# Patient Record
Sex: Female | Born: 1940 | Race: Black or African American | Hispanic: No | Smoking: Never smoker
Health system: Southern US, Community
[De-identification: ages and names within clinical notes are randomized; demographics above are authoritative.]

## PROBLEM LIST (undated history)

## (undated) DIAGNOSIS — I499 Cardiac arrhythmia, unspecified: Secondary | ICD-10-CM

## (undated) DIAGNOSIS — I509 Heart failure, unspecified: Secondary | ICD-10-CM

## (undated) DIAGNOSIS — E785 Hyperlipidemia, unspecified: Secondary | ICD-10-CM

## (undated) DIAGNOSIS — I10 Essential (primary) hypertension: Secondary | ICD-10-CM

## (undated) DIAGNOSIS — I429 Cardiomyopathy, unspecified: Secondary | ICD-10-CM

## (undated) DIAGNOSIS — R0600 Dyspnea, unspecified: Secondary | ICD-10-CM

## (undated) DIAGNOSIS — D649 Anemia, unspecified: Secondary | ICD-10-CM

## (undated) DIAGNOSIS — G4730 Sleep apnea, unspecified: Secondary | ICD-10-CM

## (undated) DIAGNOSIS — E119 Type 2 diabetes mellitus without complications: Secondary | ICD-10-CM

## (undated) DIAGNOSIS — G629 Polyneuropathy, unspecified: Secondary | ICD-10-CM

## (undated) HISTORY — DX: Heart failure, unspecified: I50.9

## (undated) HISTORY — PX: COLONOSCOPY: SHX174

## (undated) HISTORY — DX: Hyperlipidemia, unspecified: E78.5

## (undated) HISTORY — DX: Essential (primary) hypertension: I10

## (undated) HISTORY — DX: Type 2 diabetes mellitus without complications: E11.9

## (undated) HISTORY — PX: ABDOMINAL HYSTERECTOMY: SHX81

---

## 2000-01-14 ENCOUNTER — Encounter: Payer: Self-pay | Admitting: Orthopedic Surgery

## 2000-01-14 ENCOUNTER — Observation Stay (HOSPITAL_COMMUNITY): Admission: RE | Admit: 2000-01-14 | Discharge: 2000-01-15 | Payer: Self-pay | Admitting: Orthopedic Surgery

## 2004-09-12 ENCOUNTER — Ambulatory Visit: Payer: Self-pay | Admitting: Family Medicine

## 2005-09-16 ENCOUNTER — Ambulatory Visit: Payer: Self-pay | Admitting: Family Medicine

## 2005-09-29 ENCOUNTER — Encounter: Payer: Self-pay | Admitting: Family Medicine

## 2006-11-05 ENCOUNTER — Ambulatory Visit: Payer: Self-pay | Admitting: Internal Medicine

## 2007-11-11 ENCOUNTER — Ambulatory Visit: Payer: Self-pay | Admitting: Internal Medicine

## 2008-11-14 ENCOUNTER — Ambulatory Visit: Payer: Self-pay | Admitting: Internal Medicine

## 2009-11-15 ENCOUNTER — Ambulatory Visit: Payer: Self-pay | Admitting: Internal Medicine

## 2010-12-18 ENCOUNTER — Inpatient Hospital Stay: Payer: Self-pay | Admitting: Internal Medicine

## 2011-03-05 ENCOUNTER — Ambulatory Visit: Payer: Self-pay | Admitting: Gastroenterology

## 2011-03-10 LAB — PATHOLOGY REPORT

## 2011-05-22 ENCOUNTER — Ambulatory Visit: Payer: Self-pay | Admitting: Internal Medicine

## 2011-12-10 ENCOUNTER — Ambulatory Visit: Payer: Self-pay | Admitting: Internal Medicine

## 2012-05-13 ENCOUNTER — Ambulatory Visit: Payer: Self-pay | Admitting: Unknown Physician Specialty

## 2013-12-07 ENCOUNTER — Ambulatory Visit: Payer: Self-pay | Admitting: Internal Medicine

## 2014-06-12 DIAGNOSIS — I10 Essential (primary) hypertension: Secondary | ICD-10-CM | POA: Insufficient documentation

## 2014-06-12 DIAGNOSIS — G4733 Obstructive sleep apnea (adult) (pediatric): Secondary | ICD-10-CM | POA: Insufficient documentation

## 2014-06-12 DIAGNOSIS — G629 Polyneuropathy, unspecified: Secondary | ICD-10-CM | POA: Insufficient documentation

## 2014-06-12 DIAGNOSIS — D649 Anemia, unspecified: Secondary | ICD-10-CM | POA: Insufficient documentation

## 2014-06-12 DIAGNOSIS — E1129 Type 2 diabetes mellitus with other diabetic kidney complication: Secondary | ICD-10-CM | POA: Insufficient documentation

## 2014-06-12 DIAGNOSIS — E538 Deficiency of other specified B group vitamins: Secondary | ICD-10-CM | POA: Insufficient documentation

## 2014-06-12 DIAGNOSIS — E1140 Type 2 diabetes mellitus with diabetic neuropathy, unspecified: Secondary | ICD-10-CM | POA: Insufficient documentation

## 2014-06-12 DIAGNOSIS — D500 Iron deficiency anemia secondary to blood loss (chronic): Secondary | ICD-10-CM | POA: Insufficient documentation

## 2014-06-12 DIAGNOSIS — E1159 Type 2 diabetes mellitus with other circulatory complications: Secondary | ICD-10-CM | POA: Diagnosis present

## 2014-06-12 DIAGNOSIS — E785 Hyperlipidemia, unspecified: Secondary | ICD-10-CM | POA: Insufficient documentation

## 2014-06-12 DIAGNOSIS — E559 Vitamin D deficiency, unspecified: Secondary | ICD-10-CM | POA: Insufficient documentation

## 2014-06-12 DIAGNOSIS — I429 Cardiomyopathy, unspecified: Secondary | ICD-10-CM | POA: Insufficient documentation

## 2014-06-12 DIAGNOSIS — R809 Proteinuria, unspecified: Secondary | ICD-10-CM | POA: Insufficient documentation

## 2014-06-12 DIAGNOSIS — I420 Dilated cardiomyopathy: Secondary | ICD-10-CM | POA: Insufficient documentation

## 2014-06-12 DIAGNOSIS — E11319 Type 2 diabetes mellitus with unspecified diabetic retinopathy without macular edema: Secondary | ICD-10-CM | POA: Insufficient documentation

## 2014-12-05 DIAGNOSIS — M5416 Radiculopathy, lumbar region: Secondary | ICD-10-CM | POA: Insufficient documentation

## 2014-12-19 ENCOUNTER — Ambulatory Visit: Payer: Self-pay | Admitting: Internal Medicine

## 2015-10-04 DIAGNOSIS — R2231 Localized swelling, mass and lump, right upper limb: Secondary | ICD-10-CM | POA: Insufficient documentation

## 2015-12-11 ENCOUNTER — Other Ambulatory Visit: Payer: Self-pay | Admitting: Internal Medicine

## 2015-12-11 DIAGNOSIS — Z1231 Encounter for screening mammogram for malignant neoplasm of breast: Secondary | ICD-10-CM

## 2015-12-11 DIAGNOSIS — Z79899 Other long term (current) drug therapy: Secondary | ICD-10-CM | POA: Insufficient documentation

## 2016-02-27 ENCOUNTER — Ambulatory Visit: Payer: Self-pay

## 2016-03-06 ENCOUNTER — Ambulatory Visit: Payer: Self-pay

## 2016-07-29 ENCOUNTER — Encounter: Payer: Self-pay | Admitting: Urology

## 2016-07-29 ENCOUNTER — Ambulatory Visit (INDEPENDENT_AMBULATORY_CARE_PROVIDER_SITE_OTHER): Payer: Medicare Other | Admitting: Urology

## 2016-07-29 VITALS — BP 143/72 | HR 69 | Ht 64.0 in | Wt 185.0 lb

## 2016-07-29 DIAGNOSIS — N952 Postmenopausal atrophic vaginitis: Secondary | ICD-10-CM

## 2016-07-29 DIAGNOSIS — N3941 Urge incontinence: Secondary | ICD-10-CM

## 2016-07-29 DIAGNOSIS — N393 Stress incontinence (female) (male): Secondary | ICD-10-CM | POA: Diagnosis not present

## 2016-07-29 LAB — BLADDER SCAN AMB NON-IMAGING: Scan Result: 55

## 2016-07-29 MED ORDER — ESTROGENS, CONJUGATED 0.625 MG/GM VA CREA: 1.0000 | TOPICAL_CREAM | Freq: Every day | VAGINAL | 12 refills | Status: DC

## 2016-07-29 MED ORDER — ESTRADIOL 0.1 MG/GM VA CREA: TOPICAL_CREAM | VAGINAL | 12 refills | Status: DC

## 2016-07-29 NOTE — Progress Notes (Signed)
07/29/2016 3:25 PM   Shelby Pilotatherine Brown [name] 30-Sep-1941 [id number]  Referring provider: Mickey Farberavid Thies, MD [address] [facility] [place], [place] [id number]  Chief Complaint  Patient presents with  . Follow-up    incontinence    HPI: Patient is a 75 year old African American female who presents today for urinary incontinence.    She states that she has had a long time history of incontinence.  We had seen her in 2015 and gave her a trial of Vesicare for which gave her intolerable constipation.  Myrbetriq caused her blood pressure to rise.    She did PT and she found it helpful, but she states the incontinence has returned in the last 2 months.  She is experiencing stress incontinence and urge incontinence.  She is going through 2 to 3 pads daily.  Sometimes, the pads are really soaked with urine.    She is also experiencing nocturia x 3-4.  She has sleep apnea, but she does not sleep with a CPAP machine.    She has recently started dating a man and is wanting to become sexually active.  She is worried she might leak urine during intercourse.    She is not experiencing dysuria, gross hematuria or suprapubic pain.  She has not had any recent fevers, chills, nausea or vomiting.     PMH: Past Medical History:  Diagnosis Date  . Diabetes (HCC)   . Hyperlipidemia   . Hypertension     Surgical History: Past Surgical History:  Procedure Laterality Date  . ABDOMINAL HYSTERECTOMY      Home Medications:    Medication List       Accurate as of 07/29/16  3:25 PM. Always use your most recent med list.          amLODipine 5 MG tablet Commonly known as:  NORVASC Take by mouth.   aspirin EC 325 MG tablet Take by mouth.   atorvastatin 80 MG tablet Commonly known as:  LIPITOR Take by mouth.   brimonidine 0.2 % ophthalmic solution Commonly known as:  ALPHAGAN 3 (three) times daily.   candesartan 32 MG tablet Commonly known as:  ATACAND Take by  mouth.   carvedilol 25 MG tablet Commonly known as:  COREG Take by mouth.   conjugated estrogens vaginal cream Commonly known as:  PREMARIN Place 1 Applicatorful vaginally daily. Apply 0.5mg  (pea-sized amount)  just inside the vaginal introitus with a finger-tip every night for two weeks and then Monday, Wednesday and Friday nights.   cyanocobalamin 1000 MCG/ML injection Commonly known as:  (VITAMIN B-12) Inject into the muscle.   estradiol 0.1 MG/GM vaginal cream Commonly known as:  ESTRACE VAGINAL Apply 0.5mg  (pea-sized amount)  just inside the vaginal introitus with a finger-tip every night for two weeks and then Monday, Wednesday and Friday nights.   metFORMIN 500 MG tablet Commonly known as:  GLUCOPHAGE Take by mouth.   MULTI-VITAMINS Tabs Take by mouth.   travoprost (benzalkonium) 0.004 % ophthalmic solution Commonly known as:  TRAVATAN 1 drop at bedtime.       Allergies:  Allergies  Allergen Reactions  . Mirabegron Other (See Comments)    Elevated BP    Family History: Family History  Problem Relation Age of Onset  . Diabetes Mother   . Heart disease Mother   . Bladder Cancer Neg Hx   . Kidney cancer Neg Hx     Social History:  reports that she has never smoked. She  has never used smokeless tobacco. She reports that she does not drink alcohol or use drugs.  ROS: UROLOGY Frequent Urination?: No Hard to postpone urination?: No Burning/pain with urination?: No Get up at night to urinate?: Yes Leakage of urine?: Yes Urine stream starts and stops?: No Trouble starting stream?: No Do you have to strain to urinate?: No Blood in urine?: No Urinary tract infection?: No Sexually transmitted disease?: No Injury to kidneys or bladder?: No Painful intercourse?: No Weak stream?: No Currently pregnant?: No Vaginal bleeding?: No Last menstrual period?: n  Gastrointestinal Nausea?: No Vomiting?: No Indigestion/heartburn?: No Diarrhea?:  No Constipation?: Yes  Constitutional Fever: No Night sweats?: No Weight loss?: No Fatigue?: No  Skin Skin rash/lesions?: No Itching?: No  Eyes Blurred vision?: No Double vision?: No  Ears/Nose/Throat Sore throat?: No Sinus problems?: No  Hematologic/Lymphatic Swollen glands?: No Easy bruising?: No  Cardiovascular Leg swelling?: No Chest pain?: No  Respiratory Cough?: No Shortness of breath?: No  Endocrine Excessive thirst?: No  Musculoskeletal Back pain?: No Joint pain?: No  Neurological Headaches?: No Dizziness?: No  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 Alert and oriented, No acute distress. HEENT: [place] AT, moist mucus membranes. Trachea midline, no masses. Cardiovascular: No clubbing, cyanosis, or edema. Respiratory: Normal respiratory effort, no increased work of breathing. GI: Abdomen is soft, non tender, non distended, no abdominal masses. Liver and spleen not palpable.  No hernias appreciated.  Stool sample for occult testing is not indicated.   GU: No CVA tenderness.  No bladder fullness or masses.  Atrophic external genitalia, normal pubic hair distribution, no lesions.  Normal urethral meatus, no lesions, no prolapse, no discharge.   No urethral masses, tenderness and/or tenderness. No bladder fullness, tenderness or masses. Normal vagina mucosa, good estrogen effect, no discharge, no lesions, good pelvic support, no cystocele or rectocele noted.  Cervix and uterus are surgically absent.  Could not palpate ovaries due to body habitus.  Anus and perineum are without rashes or lesions.    Skin: No rashes, bruises or suspicious lesions. Lymph: No cervical or inguinal adenopathy. Neurologic: Grossly intact, no focal deficits, moving all 4  extremities. Psychiatric: Normal mood and affect.  Laboratory Data:  Pertinent Imaging: Results for Shelby PilotSTADLER, Sarinah BROWN (MRN [id number]) as of 07/29/2016 15:02  Ref. Range 07/29/2016 14:53  Scan Result Unknown 55    Assessment & Plan:    1. Urge incontinence of urine  - offered behavioral therapies; bladder training, bladder control strategies, pelvic floor muscle training and fluid management - had PT in the past  - failed Myrbetriq  - offered refer to gynecology for a pessary fitting - not interested as she is going to become sexually active  - Vesicare caused constipation  -  would like to retry anticholinergic therapy.  Given Toviaz 8mg  samples, # 28.   Advised of the side effects, such as: Dry eyes, dry mouth, constipation, mental confusion and/or urinary retention.   - RTC in 3 weeks for PVR and symptom recheck   - Bladder Scan (Post Void Residual) in office  2. SUI  - patient has had PT in the past and does not want to attend again  3. Vaginal atrophy  I explained to the patient that when women go  through menopause and her estrogen levels are severely diminished, the normal vaginal mucosa will change.  This will cause bladder irritability and may contribute to urgency and urge incontinence.    Patient was given a sample of vaginal estrogen cream (Estrace) and instructed to apply 0.5mg  (pea-sized amount)  just inside the vaginal introitus with a finger-tip every night for two weeks and then Monday, Wednesday and Friday nights.  I explained to the patient that vaginally administered estrogen, which causes only a slight increase in the blood estrogen levels, have fewer contraindications and adverse systemic effects that oral HT.  I have also given prescriptions for the Estrace cream and Premarin cream, so that the patient may carry them to the pharmacy to see which one of the branded creams would be most economical for her.  She will return in 3 weeks for symptom recheck, exam and  report if she was available to require the vaginal cream by prescription.   Return in about 3 weeks (around 08/19/2016) for PVR and exam.  These notes generated with voice recognition software. I apologize for typographical errors.  Michiel Cowboy, PA-C  [facility] [address], [address] [place], [place] [phone]

## 2016-07-29 NOTE — Patient Instructions (Signed)
   I have given you two prescriptions for a vaginal estrogen cream.  Estrace and Premarin.  Please take these to your pharmacy and see which one your insurance covers.  If both are too expensive, please call the office at [phone] for an alternative.  You are given a sample of vaginal estrogen cream (Premarin/Estrace) and instructed to apply 0.5mg (pea-sized amount)  just inside the vaginal introitus with a finger-tip every night for two weeks.    

## 2016-07-30 ENCOUNTER — Telehealth: Payer: Self-pay

## 2016-07-30 NOTE — Telephone Encounter (Signed)
Pt called stating she told to call back with price of estrace cream. Pt stated estrace is [phone].

## 2016-07-30 NOTE — Telephone Encounter (Signed)
Spoke with pt in reference to estrace cream and providing samples. Pt voiced understanding.

## 2016-07-30 NOTE — Telephone Encounter (Signed)
Please tell her that she does not need to fill that prescription.  We can give samples.

## 2016-08-19 ENCOUNTER — Ambulatory Visit: Payer: Medicare Other | Admitting: Urology

## 2016-08-20 ENCOUNTER — Ambulatory Visit: Payer: Medicare Other | Admitting: Urology

## 2016-09-03 ENCOUNTER — Encounter: Payer: Self-pay | Admitting: Urology

## 2016-09-03 ENCOUNTER — Ambulatory Visit (INDEPENDENT_AMBULATORY_CARE_PROVIDER_SITE_OTHER): Payer: Medicare Other | Admitting: Urology

## 2016-09-03 ENCOUNTER — Telehealth: Payer: Self-pay | Admitting: Urology

## 2016-09-03 VITALS — BP 109/65 | HR 74 | Ht 64.0 in | Wt 184.6 lb

## 2016-09-03 DIAGNOSIS — N952 Postmenopausal atrophic vaginitis: Secondary | ICD-10-CM | POA: Diagnosis not present

## 2016-09-03 DIAGNOSIS — N3941 Urge incontinence: Secondary | ICD-10-CM

## 2016-09-03 DIAGNOSIS — N393 Stress incontinence (female) (male): Secondary | ICD-10-CM

## 2016-09-03 LAB — BLADDER SCAN AMB NON-IMAGING: SCAN RESULT: 28

## 2016-09-03 MED ORDER — FESOTERODINE FUMARATE ER 8 MG PO TB24: 8.0000 mg | ORAL_TABLET | Freq: Every day | ORAL | 4 refills | Status: DC

## 2016-09-03 NOTE — Progress Notes (Signed)
09/03/2016 10:18 AM   Shelby [name] Oct 23, 1941 [id number]  Referring provider: No referring provider defined for this encounter.  Chief Complaint  Patient presents with  . Urinary Urgency    Follow up    HPI: Patient is a 75 year old African American female who presents today for a three week follow up after a trial of Toviaz 8 mg for urinary incontinence and vaginal estrogen cream for vaginal atrophy.  Background She states that she has had a long time history of incontinence.  We had seen her in 2015 and gave her a trial of Vesicare for which gave her intolerable constipation.  Myrbetriq caused her blood pressure to rise.  She did PT and she found it helpful, but she states the incontinence had returned.  She was experiencing stress incontinence and urge incontinence.  She was going through 2 to 3 pads daily.  Sometimes, the pads are really soaked with urine.  She was also experiencing nocturia x 3-4.  She has sleep apnea, but she does not sleep with a CPAP machine.    She has recently started dating a man and is wanting to become sexually active.  She is worried she might leak urine during intercourse.    She is not experiencing dysuria, gross hematuria or suprapubic pain.  She has not had any recent fevers, chills, nausea or vomiting.    She states that she has noted an improvement since being on the Toviaz 8 mg daily. She has not had any severe side effects.  She states her urinary symptoms have improved by 50%.  She has been pleased with the effect of Toviaz. She would like to continue the medication.    Her PVR today was 28 mL.    She is using a vaginal estrogen cream 3 nights weekly.  She has not noted any vaginal irritation or rash with the cream.   PMH: Past Medical History:  Diagnosis Date  . Diabetes (HCC)   . Hyperlipidemia   . Hypertension     Surgical History: Past Surgical History:  Procedure Laterality Date  . ABDOMINAL HYSTERECTOMY       Home Medications:    Medication List       Accurate as of 09/03/16 10:18 AM. Always use your most recent med list.          amLODipine 5 MG tablet Commonly known as:  NORVASC Take by mouth.   aspirin EC 325 MG tablet Take by mouth.   atorvastatin 80 MG tablet Commonly known as:  LIPITOR Take by mouth.   brimonidine 0.2 % ophthalmic solution Commonly known as:  ALPHAGAN 3 (three) times daily.   candesartan 32 MG tablet Commonly known as:  ATACAND Take by mouth.   carvedilol 25 MG tablet Commonly known as:  COREG Take by mouth.   conjugated estrogens vaginal cream Commonly known as:  PREMARIN Place 1 Applicatorful vaginally daily. Apply 0.5mg  (pea-sized amount)  just inside the vaginal introitus with a finger-tip every night for two weeks and then Monday, Wednesday and Friday nights.   cyanocobalamin 1000 MCG/ML injection Commonly known as:  (VITAMIN B-12) Inject into the muscle.   estradiol 0.1 MG/GM vaginal cream Commonly known as:  ESTRACE VAGINAL Apply 0.5mg  (pea-sized amount)  just inside the vaginal introitus with a finger-tip every night for two weeks and then Monday, Wednesday and Friday nights.   fesoterodine 8 MG Tb24 tablet Commonly known as:  TOVIAZ Take 1 tablet (8 mg total) by mouth  daily.   metFORMIN 500 MG tablet Commonly known as:  GLUCOPHAGE Take by mouth.   MULTI-VITAMINS Tabs Take by mouth.   travoprost (benzalkonium) 0.004 % ophthalmic solution Commonly known as:  TRAVATAN 1 drop at bedtime.       Allergies:  Allergies  Allergen Reactions  . Mirabegron Other (See Comments)    Elevated BP    Family History: Family History  Problem Relation Age of Onset  . Diabetes Mother   . Heart disease Mother   . Bladder Cancer Neg Hx   . Kidney cancer Neg Hx     Social History:  reports that she has never smoked. She has never used smokeless tobacco. She reports that she does not drink alcohol or use  drugs.  ROS: UROLOGY Frequent Urination?: Yes Hard to postpone urination?: No Burning/pain with urination?: No Get up at night to urinate?: Yes Leakage of urine?: Yes Urine stream starts and stops?: No Trouble starting stream?: No Do you have to strain to urinate?: No Blood in urine?: No Urinary tract infection?: No Sexually transmitted disease?: No Injury to kidneys or bladder?: No Painful intercourse?: No Weak stream?: No Currently pregnant?: No Vaginal bleeding?: No Last menstrual period?: n  Gastrointestinal Nausea?: No Vomiting?: No Indigestion/heartburn?: No Diarrhea?: No Constipation?: Yes  Constitutional Fever: No Night sweats?: Yes Weight loss?: No Fatigue?: No  Skin Skin rash/lesions?: No Itching?: No  Eyes Blurred vision?: No Double vision?: No  Ears/Nose/Throat Sore throat?: No Sinus problems?: No  Hematologic/Lymphatic Swollen glands?: No Easy bruising?: No  Cardiovascular Leg swelling?: No Chest pain?: No  Respiratory Cough?: No Shortness of breath?: No  Endocrine Excessive thirst?: No  Musculoskeletal Back pain?: Yes Joint pain?: No  Neurological Headaches?: No Dizziness?: No  Psychologic Depression?: No Anxiety?: No  Physical Exam: BP 109/65 (BP Location: Left Arm, Patient Position: Sitting, Cuff Size: Normal)   Pulse 74   Ht 5\' 4"  (1.626 m)   Wt 184 lb 9.6 oz (83.7 kg)   BMI 31.69 kg/m   Constitutional: Well nourished. Alert and oriented, No acute distress. HEENT: [place] AT, moist mucus membranes. Trachea midline, no masses. Cardiovascular: No clubbing, cyanosis, or edema. Respiratory: Normal respiratory effort, no increased work of breathing. Skin: No rashes, bruises or suspicious lesions. Lymph: No cervical or inguinal adenopathy. Neurologic: Grossly intact, no focal deficits, moving all 4 extremities. Psychiatric: Normal mood and affect.  Laboratory Data:  Pertinent Imaging: Results for Shelby, [name] (MRN [id number]) as of 09/03/2016 10:13  Ref. Range 09/03/2016 10:09  Scan Result Unknown 28     Assessment & Plan:    1. Urge incontinence of urine  - had PT in the past  - failed Myrbetriq  - offered refer to gynecology for a pessary fitting - not interested as she is going to become sexually active  - Vesicare caused constipation  - Found the Toviaz effective   - Continue Toviaz 8mg  - prescription sent to pharmacy   - RTC in 3 months for PVR and symptom recheck   - Bladder Scan (Post Void Residual) in office  2. SUI  - patient has had PT in the past and does not want to attend again  - Toviaz provided some benefit  3. Vaginal atrophy  - Patient to continue the vaginal estrogen cream 3 nights weekly   - Patient was given a sample of vaginal estrogen cream (Estrace)   - I have also given prescriptions for the Estrace cream and Premarin cream, so that the patient  may carry them to the pharmacy to see which one of the branded creams would be most economical for her.    - She will return in 3 months for symptom recheck, exam and report if she was available to require the vaginal cream by prescription.   Return in about 3 months (around 12/03/2016) for PVR and exam.  These notes generated with voice recognition software. I apologize for typographical errors.  Michiel Cowboy, PA-C  [facility] [address] [place], [place] [address] [phone]

## 2016-09-03 NOTE — Telephone Encounter (Signed)
Would you call in the compounded vaginal estrogen cream for this patient?

## 2016-09-03 NOTE — Telephone Encounter (Signed)
Called medication into [facility].

## 2016-12-10 ENCOUNTER — Ambulatory Visit: Payer: Medicare Other | Admitting: Urology

## 2016-12-10 ENCOUNTER — Encounter: Payer: Self-pay | Admitting: Urology

## 2016-12-23 ENCOUNTER — Ambulatory Visit: Admission: RE | Admit: 2016-12-23 | Payer: Medicare Other | Source: Ambulatory Visit | Admitting: Gastroenterology

## 2016-12-23 ENCOUNTER — Encounter: Admission: RE | Payer: Self-pay | Source: Ambulatory Visit

## 2016-12-23 SURGERY — COLONOSCOPY WITH PROPOFOL
Anesthesia: General

## 2016-12-26 ENCOUNTER — Ambulatory Visit: Admitting: Urology

## 2017-01-01 NOTE — Progress Notes (Signed)
01/02/2017 11:07 AM   Shelby [name] [birth year] [id number]  Referring provider: No referring provider defined for this encounter.  Chief Complaint  Patient presents with  . Urinary Incontinence    67month    HPI: Patient is a 76 year old African American female who presents today for a three month follow up after a trial of Toviaz 8 mg for urinary incontinence and vaginal estrogen cream for vaginal atrophy.  Background She states that she has had a long time history of incontinence.  We had seen her in 2015 and gave her a trial of Vesicare for which gave her intolerable constipation.  Myrbetriq caused her blood pressure to rise.  She did PT and she found it helpful, but she states the incontinence had returned.  She was experiencing stress incontinence and urge incontinence.  She was going through 2 to 3 pads daily.  Sometimes, the pads are really soaked with urine.  She was also experiencing nocturia x 3-4.  She has sleep apnea, but she does not sleep with a CPAP machine.  She has recently started dating a man and is wanting to become sexually active.  She is worried she might leak urine during intercourse.  She is not experiencing dysuria, gross hematuria or suprapubic pain.  She has not had any recent fevers, chills, nausea or vomiting.  She states that she has noted an improvement since being on the Toviaz 8 mg daily. She has not had any severe side effects.  She states her urinary symptoms have improved by 50%.  She has been pleased with the effect of Toviaz. She would like to continue the medication.    Her PVR today was 0 mL.  She is using a vaginal estrogen cream 3 nights weekly.  She has not noted any vaginal irritation or rash with the cream.  She states that she is still having benefits with the Toviaz 8 mg daily and is using a vaginal cream 3 days weekly. She has urgency to 3 times daily, she uses the rest room 3 times daily, she does try to restrict fluids in an effort  to stave off bathroom trips, she does not engage toilet mapping, she is having SUI up to seven times daily and nocturia x 3.    She is not having dysuria, gross hematuria or suprapubic pain.  She has not had fevers, chills, nausea or vomiting.     PMH: Past Medical History:  Diagnosis Date  . Diabetes (HCC)   . Hyperlipidemia   . Hypertension     Surgical History: Past Surgical History:  Procedure Laterality Date  . ABDOMINAL HYSTERECTOMY      Home Medications:  Allergies as of 01/02/2017      Reactions   Mirabegron Other (See Comments)   Elevated BP      Medication List       Accurate as of 01/02/17 11:07 AM. Always use your most recent med list.          amLODipine 5 MG tablet Commonly known as:  NORVASC Take by mouth.   aspirin EC 325 MG tablet Take by mouth.   atorvastatin 80 MG tablet Commonly known as:  LIPITOR Take by mouth.   brimonidine 0.2 % ophthalmic solution Commonly known as:  ALPHAGAN 3 (three) times daily.   candesartan 32 MG tablet Commonly known as:  ATACAND Take by mouth.   carvedilol 25 MG tablet Commonly known as:  COREG Take by mouth.   cyanocobalamin 1000  MCG/ML injection Commonly known as:  (VITAMIN B-12) Inject into the muscle.   estradiol 0.1 MG/GM vaginal cream Commonly known as:  ESTRACE VAGINAL Apply 0.5mg  (pea-sized amount)  just inside the vaginal introitus with a finger-tip every night for two weeks and then Monday, Wednesday and Friday nights.   fesoterodine 8 MG Tb24 tablet Commonly known as:  TOVIAZ Take 1 tablet (8 mg total) by mouth daily.   metFORMIN 500 MG tablet Commonly known as:  GLUCOPHAGE Take by mouth.   MULTI-VITAMINS Tabs Take by mouth.   travoprost (benzalkonium) 0.004 % ophthalmic solution Commonly known as:  TRAVATAN 1 drop at bedtime.       Allergies:  Allergies  Allergen Reactions  . Mirabegron Other (See Comments)    Elevated BP    Family History: Family History  Problem  Relation Age of Onset  . Diabetes Mother   . Heart disease Mother   . Bladder Cancer Neg Hx   . Kidney cancer Neg Hx     Social History:  reports that she has never smoked. She has never used smokeless tobacco. She reports that she does not drink alcohol or use drugs.  ROS: UROLOGY Frequent Urination?: Yes Hard to postpone urination?: No Burning/pain with urination?: No Get up at night to urinate?: No Leakage of urine?: Yes Urine stream starts and stops?: No Trouble starting stream?: No Do you have to strain to urinate?: No Blood in urine?: No Urinary tract infection?: No Sexually transmitted disease?: No Injury to kidneys or bladder?: No Painful intercourse?: No Weak stream?: No Currently pregnant?: No Vaginal bleeding?: No Last menstrual period?: n  Gastrointestinal Nausea?: No Vomiting?: No Indigestion/heartburn?: No Diarrhea?: No Constipation?: No  Constitutional Fever: No Night sweats?: No Weight loss?: No Fatigue?: No  Skin Skin rash/lesions?: No Itching?: No  Eyes Blurred vision?: No Double vision?: No  Ears/Nose/Throat Sore throat?: No Sinus problems?: No  Hematologic/Lymphatic Swollen glands?: No Easy bruising?: No  Cardiovascular Leg swelling?: No Chest pain?: No  Respiratory Cough?: No Shortness of breath?: No  Endocrine Excessive thirst?: No  Musculoskeletal Back pain?: No Joint pain?: No  Neurological Headaches?: No Dizziness?: No  Psychologic Depression?: No Anxiety?: No  Physical Exam: BP 114/65   Pulse 74   Ht 5\' 4"  (1.626 m)   Wt 184 lb (83.5 kg)   BMI 31.58 kg/m   Constitutional: Well nourished. Alert and oriented, No acute distress. HEENT:  AT, moist mucus membranes. Trachea midline, no masses. Cardiovascular: No clubbing, cyanosis, or edema. Respiratory: Normal respiratory effort, no increased work of breathing. GI: Abdomen is soft, non tender, non distended, no abdominal masses. Liver and spleen not  palpable.  No hernias appreciated.  Stool sample for occult testing is not indicated.   GU: No CVA tenderness.  No bladder fullness or masses.  Atrophic external genitalia, normal pubic hair distribution, no lesions.  Normal urethral meatus, no lesions, no prolapse, no discharge.   No urethral masses, tenderness and/or tenderness. No bladder fullness, tenderness or masses. Normal vagina mucosa, good estrogen effect, no discharge, no lesions, good pelvic support, no cystocele or rectocele noted.  Cervix and uterus are surgically absent.  Could not palpate ovaries due to body habitus.  Anus and perineum are without rashes or lesions.    Skin: No rashes, bruises or suspicious lesions. Lymph: No cervical or inguinal adenopathy. Neurologic: Grossly intact, no focal deficits, moving all 4 extremities. Psychiatric: Normal mood and affect.   Pertinent Imaging: Results for Shelby, [name] (MRN [id number]) as  of 01/02/2017 11:01  Ref. Range 01/02/2017 11:00  Scan Result Unknown 0ml    Assessment & Plan:    1. Urge incontinence of urine  - had PT in the past  - failed Myrbetriq  - offered refer to gynecology for a pessary fitting - not interested as she is going to become sexually active  - Vesicare caused constipation  - Found the Toviaz effective   - Continue Toviaz 8mg  - prescription sent to pharmacy   - RTC in following PT for PVR and OAB questionnaire  - Bladder Scan (Post Void Residual) in office  2. SUI  - patient has had PT in the past and does not want to attend again; I have encouraged the patient to undergo physical therapy again as I feel this to be most beneficial in helping her with her stress urinary incontinence, she is agreeable  - Toviaz provided some benefit  3. Vaginal atrophy  - Patient to continue the vaginal estrogen cream 3 nights weekly     Return for follow up after PT.  These notes generated with voice recognition software. I apologize for typographical  errors.  Michiel Cowboy, PA-C  [facility] [address] [place], [place] [address] [phone]

## 2017-01-02 ENCOUNTER — Encounter: Payer: Self-pay | Admitting: Urology

## 2017-01-02 ENCOUNTER — Ambulatory Visit (INDEPENDENT_AMBULATORY_CARE_PROVIDER_SITE_OTHER): Payer: Medicare Other | Admitting: Urology

## 2017-01-02 VITALS — BP 114/65 | HR 74 | Ht 64.0 in | Wt 184.0 lb

## 2017-01-02 DIAGNOSIS — N3941 Urge incontinence: Secondary | ICD-10-CM | POA: Diagnosis not present

## 2017-01-02 DIAGNOSIS — N952 Postmenopausal atrophic vaginitis: Secondary | ICD-10-CM | POA: Diagnosis not present

## 2017-01-02 DIAGNOSIS — N393 Stress incontinence (female) (male): Secondary | ICD-10-CM

## 2017-01-02 LAB — BLADDER SCAN AMB NON-IMAGING

## 2017-02-23 DIAGNOSIS — R32 Unspecified urinary incontinence: Secondary | ICD-10-CM | POA: Insufficient documentation

## 2017-03-31 ENCOUNTER — Other Ambulatory Visit: Payer: Self-pay | Admitting: Internal Medicine

## 2017-03-31 DIAGNOSIS — Z1231 Encounter for screening mammogram for malignant neoplasm of breast: Secondary | ICD-10-CM

## 2017-04-07 ENCOUNTER — Ambulatory Visit: Admission: RE | Admit: 2017-04-07 | Payer: Medicare Other | Source: Ambulatory Visit | Admitting: Gastroenterology

## 2017-04-07 ENCOUNTER — Encounter: Admission: RE | Payer: Self-pay | Source: Ambulatory Visit

## 2017-04-07 SURGERY — COLONOSCOPY WITH PROPOFOL
Anesthesia: General

## 2017-06-03 ENCOUNTER — Ambulatory Visit: Payer: Medicare Other | Admitting: Urology

## 2017-06-04 ENCOUNTER — Ambulatory Visit: Payer: Medicare Other | Admitting: Urology

## 2017-06-07 NOTE — Progress Notes (Signed)
06/08/2017 3:06 PM   Shelby [name] 24-Nov-1941 [id number]  Referring provider: Mickey Farber, MD [address] [facility] [place], [place] [address]  Chief Complaint  Patient presents with  . Urinary Incontinence    follow up    HPI: Patient is a 76 year old [place] American female who presents today with the complaint of an inability to hold her urine at night.     Background She states that she has had a long time history of incontinence.  We had seen her in 2015 and gave her a trial of Vesicare for which gave her intolerable constipation.  Myrbetriq caused her blood pressure to rise.  She did PT and she found it helpful, but she states the incontinence had returned.  She was experiencing stress incontinence and urge incontinence.  She was going through 2 to 3 pads daily.  Sometimes, the pads are really soaked with urine.  She was also experiencing nocturia x 3-4.  She has sleep apnea, but she does not sleep with a CPAP machine.  She has recently started dating a man and is wanting to become sexually active.  She is worried she might leak urine during intercourse.  She is not experiencing dysuria, gross hematuria or suprapubic pain.  She has not had any recent fevers, chills, nausea or vomiting.  She states that she has noted an improvement since being on the Toviaz 8 mg daily. She has not had any severe side effects.  She states her urinary symptoms have improved by 50%.  She has been pleased with the effect of Toviaz. She would like to continue the medication.    Her PVR today was 15 mL.  She is using a vaginal estrogen cream 3 nights weekly.  She has not noted any vaginal irritation or rash with the cream.  She states that she is still having benefits with the Toviaz 8 mg daily.  The patient has been experiencing urgency x 4-7 (worse), frequency x 4-7 (worse), is restricting fluids to avoid visits to the restroom, is engaging in toilet mapping, incontinence x 4-7  (worse) and nocturia x 0-3 (stable).    She states she has noticed worsening of her symptoms, since she stopped her vaginal estrogen cream.    She is not having dysuria, gross hematuria or suprapubic pain.  She has not had fevers, chills, nausea or vomiting.     PMH: Past Medical History:  Diagnosis Date  . Diabetes (HCC)   . Hyperlipidemia   . Hypertension     Surgical History: Past Surgical History:  Procedure Laterality Date  . ABDOMINAL HYSTERECTOMY      Home Medications:  Allergies as of 06/08/2017      Reactions   Mirabegron Other (See Comments)   Elevated BP      Medication List       Accurate as of 06/08/17  3:06 PM. Always use your most recent med list.          amLODipine 5 MG tablet Commonly known as:  NORVASC Take by mouth.   aspirin EC 325 MG tablet Take by mouth.   atorvastatin 80 MG tablet Commonly known as:  LIPITOR Take by mouth.   brimonidine 0.2 % ophthalmic solution Commonly known as:  ALPHAGAN 3 (three) times daily.   candesartan 32 MG tablet Commonly known as:  ATACAND Take by mouth.   carvedilol 25 MG tablet Commonly known as:  COREG Take by mouth.   conjugated estrogens vaginal  cream Commonly known as:  PREMARIN Place 1 Applicatorful vaginally daily. Apply 0.5mg  (pea-sized amount)  just inside the vaginal introitus with a finger-tip every night for two weeks and then Monday, Wednesday and Friday nights.   estradiol 0.1 MG/GM vaginal cream Commonly known as:  ESTRACE VAGINAL Apply 0.5mg  (pea-sized amount)  just inside the vaginal introitus with a finger-tip every night for two weeks and then Monday, Wednesday and Friday nights.   fesoterodine 8 MG Tb24 tablet Commonly known as:  TOVIAZ Take 1 tablet (8 mg total) by mouth daily.   metFORMIN 500 MG tablet Commonly known as:  GLUCOPHAGE Take by mouth.   MULTI-VITAMINS Tabs Take by mouth.   travoprost (benzalkonium) 0.004 % ophthalmic solution Commonly known as:   TRAVATAN 1 drop at bedtime.       Allergies:  Allergies  Allergen Reactions  . Mirabegron Other (See Comments)    Elevated BP    Family History: Family History  Problem Relation Age of Onset  . Diabetes Mother   . Heart disease Mother   . Bladder Cancer Neg Hx   . Kidney cancer Neg Hx     Social History:  reports that she has never smoked. She has never used smokeless tobacco. She reports that she does not drink alcohol or use drugs.  ROS: UROLOGY Frequent Urination?: No Hard to postpone urination?: Yes Burning/pain with urination?: No Get up at night to urinate?: Yes Leakage of urine?: Yes Urine stream starts and stops?: No Trouble starting stream?: No Do you have to strain to urinate?: No Blood in urine?: No Urinary tract infection?: No Sexually transmitted disease?: No Injury to kidneys or bladder?: No Painful intercourse?: No Weak stream?: No Currently pregnant?: No Vaginal bleeding?: No Last menstrual period?: n  Gastrointestinal Nausea?: No Vomiting?: No Indigestion/heartburn?: No Diarrhea?: No Constipation?: No  Constitutional Fever: No Night sweats?: No Weight loss?: No Fatigue?: No  Skin Skin rash/lesions?: No Itching?: No  Eyes Blurred vision?: No Double vision?: No  Ears/Nose/Throat Sore throat?: No Sinus problems?: Yes  Hematologic/Lymphatic Swollen glands?: No Easy bruising?: No  Cardiovascular Leg swelling?: No Chest pain?: No  Respiratory Cough?: No Shortness of breath?: No  Endocrine Excessive thirst?: No  Musculoskeletal Back pain?: No Joint pain?: No  Neurological Headaches?: No Dizziness?: No  Psychologic Depression?: Yes Anxiety?: No  Physical Exam: BP 107/62   Pulse 80   Ht 5\' 4"  (1.626 m)   Wt 198 lb (89.8 kg)   BMI 33.99 kg/m   Constitutional: Well nourished. Alert and oriented, No acute distress. HEENT: [place] AT, moist mucus membranes. Trachea midline, no masses. Cardiovascular: No  clubbing, cyanosis, or edema. Respiratory: Normal respiratory effort, no increased work of breathing. Skin: No rashes, bruises or suspicious lesions. Lymph: No cervical or inguinal adenopathy. Neurologic: Grossly intact, no focal deficits, moving all 4 extremities. Psychiatric: Normal mood and affect.   Pertinent Imaging: Results for AZHIA, SIEFKEN (MRN [id number]) as of 06/08/2017 15:09  Ref. Range 06/08/2017 14:54  Scan Result Unknown 14ml     Assessment & Plan:    1. Urge incontinence of urine  - had PT in the past and failed Myrbetriq  - offered refer to gynecology for a pessary fitting - not interested as she is going to become sexually active  - Continue Toviaz 8mg  - prescription sent to pharmacy   - RTC in 1 month for PVR and OAB questionnaire  - Bladder Scan (Post Void Residual) in office  2. SUI  - patient has  had PT in the past and does not want to attend again; I have encouraged the patient to undergo physical therapy again as I feel this to be most beneficial in helping her with her stress urinary incontinence, she is agreeable - she went once and did not find it effective  - Toviaz provided some benefit  3. Vaginal atrophy  - Patient to restart the vaginal estrogen cream 3 nights weekly   - RTC in one month    Return in about 1 month (around 07/09/2017) for OAB questionnaire, PVR and exam.  These notes generated with voice recognition software. I apologize for typographical errors.  Michiel CowboySHANNON Carlon Davidson, PA-C  [facility] [address], [address] [place], [place] [address] [phone]

## 2017-06-08 ENCOUNTER — Encounter: Payer: Self-pay | Admitting: Urology

## 2017-06-08 ENCOUNTER — Ambulatory Visit (INDEPENDENT_AMBULATORY_CARE_PROVIDER_SITE_OTHER): Payer: Medicare Other | Admitting: Urology

## 2017-06-08 VITALS — BP 107/62 | HR 80 | Ht 64.0 in | Wt 198.0 lb

## 2017-06-08 DIAGNOSIS — R32 Unspecified urinary incontinence: Secondary | ICD-10-CM | POA: Diagnosis not present

## 2017-06-08 DIAGNOSIS — N952 Postmenopausal atrophic vaginitis: Secondary | ICD-10-CM

## 2017-06-08 DIAGNOSIS — N3946 Mixed incontinence: Secondary | ICD-10-CM | POA: Diagnosis not present

## 2017-06-08 LAB — BLADDER SCAN AMB NON-IMAGING

## 2017-06-08 MED ORDER — ESTROGENS, CONJUGATED 0.625 MG/GM VA CREA: 1.0000 | TOPICAL_CREAM | Freq: Every day | VAGINAL | 12 refills | Status: DC

## 2017-07-09 ENCOUNTER — Ambulatory Visit: Payer: Medicare Other | Admitting: Urology

## 2017-07-22 ENCOUNTER — Ambulatory Visit: Payer: Medicare Other | Admitting: Urology

## 2017-07-22 ENCOUNTER — Encounter: Payer: Self-pay | Admitting: Urology

## 2017-07-22 ENCOUNTER — Ambulatory Visit (INDEPENDENT_AMBULATORY_CARE_PROVIDER_SITE_OTHER): Payer: Medicare Other | Admitting: Urology

## 2017-07-22 VITALS — BP 105/65 | HR 80 | Ht 64.0 in | Wt 195.2 lb

## 2017-07-22 DIAGNOSIS — N952 Postmenopausal atrophic vaginitis: Secondary | ICD-10-CM

## 2017-07-22 DIAGNOSIS — N3941 Urge incontinence: Secondary | ICD-10-CM

## 2017-07-22 DIAGNOSIS — N393 Stress incontinence (female) (male): Secondary | ICD-10-CM | POA: Diagnosis not present

## 2017-07-22 LAB — BLADDER SCAN AMB NON-IMAGING: Scan Result: 0

## 2017-07-22 NOTE — Progress Notes (Signed)
07/22/2017 3:07 PM   Shelby [name] 06-16-1941 [id number]  Referring provider: Mickey Farber, MD [address] [facility] [place], [place] [address]  Chief Complaint  Patient presents with  . Urinary Incontinence    Urge 1 month follow up   . Vaginal Atrophy    HPI: Patient is a 76 year old [place] American female who presents today with the complaint of an inability to hold her urine at night after a trial of Toviaz.    Background She states that she has had a long time history of incontinence.  We had seen her in 2015 and gave her a trial of Vesicare for which gave her intolerable constipation.  Myrbetriq caused her blood pressure to rise.  She did PT and she found it helpful, but she states the incontinence had returned.  She was experiencing stress incontinence and urge incontinence.  She was going through 2 to 3 pads daily.  Sometimes, the pads are really soaked with urine.  She was also experiencing nocturia x 3-4.  She has sleep apnea, but she does not sleep with a CPAP machine.  She has recently started dating a man and is wanting to become sexually active.  She is worried she might leak urine during intercourse.  She is not experiencing dysuria, gross hematuria or suprapubic pain.  She has not had any recent fevers, chills, nausea or vomiting.  She states that she has noted an improvement since being on the Toviaz 8 mg daily. She has not had any severe side effects.  She states her urinary symptoms have improved by 50%.  She has been pleased with the effect of Toviaz. She would like to continue the medication.    Her PVR today was 0 mL.  She is using a vaginal estrogen cream 3 nights weekly.  She has not noted any vaginal irritation or rash with the cream.  She states that she is still having benefits with the Toviaz 8 mg daily.  The patient has been experiencing urgency x 0-3 (improved), frequency x 0-3 (improved), not restricting fluids to avoid visits to  the restroom, is engaging in toilet mapping, incontinence x 0-3 (improved) and nocturia x 0-3 (stable).    She is not having dysuria, gross hematuria or suprapubic pain.  She has not had fevers, chills, nausea or vomiting.     PMH: Past Medical History:  Diagnosis Date  . Diabetes (HCC)   . Hyperlipidemia   . Hypertension     Surgical History: Past Surgical History:  Procedure Laterality Date  . ABDOMINAL HYSTERECTOMY      Home Medications:  Allergies as of 07/22/2017      Reactions   Mirabegron Other (See Comments)   Elevated BP      Medication List       Accurate as of 07/22/17  3:07 PM. Always use your most recent med list.          amLODipine 5 MG tablet Commonly known as:  NORVASC Take by mouth.   aspirin EC 325 MG tablet Take by mouth.   atorvastatin 80 MG tablet Commonly known as:  LIPITOR Take by mouth.   brimonidine 0.2 % ophthalmic solution Commonly known as:  ALPHAGAN 3 (three) times daily.   candesartan 32 MG tablet Commonly known as:  ATACAND Take by mouth.   carvedilol 25 MG tablet Commonly known as:  COREG Take by mouth.   conjugated estrogens vaginal cream Commonly known as:  PREMARIN Place  1 Applicatorful vaginally daily. Apply 0.5mg  (pea-sized amount)  just inside the vaginal introitus with a finger-tip every night for two weeks and then Monday, Wednesday and Friday nights.   estradiol 0.1 MG/GM vaginal cream Commonly known as:  ESTRACE VAGINAL Apply 0.5mg  (pea-sized amount)  just inside the vaginal introitus with a finger-tip every night for two weeks and then Monday, Wednesday and Friday nights.   ferrous sulfate 325 (65 FE) MG tablet Take by mouth.   fesoterodine 8 MG Tb24 tablet Commonly known as:  TOVIAZ Take 1 tablet (8 mg total) by mouth daily.   metFORMIN 500 MG tablet Commonly known as:  GLUCOPHAGE Take by mouth.   MULTI-VITAMINS Tabs Take by mouth.   travoprost (benzalkonium) 0.004 % ophthalmic solution Commonly  known as:  TRAVATAN 1 drop at bedtime.       Allergies:  Allergies  Allergen Reactions  . Mirabegron Other (See Comments)    Elevated BP    Family History: Family History  Problem Relation Age of Onset  . Diabetes Mother   . Heart disease Mother   . Bladder Cancer Neg Hx   . Kidney cancer Neg Hx     Social History:  reports that she has never smoked. She has never used smokeless tobacco. She reports that she does not drink alcohol or use drugs.  ROS: UROLOGY Frequent Urination?: No Hard to postpone urination?: No Burning/pain with urination?: No Get up at night to urinate?: No Leakage of urine?: Yes Urine stream starts and stops?: No Trouble starting stream?: No Do you have to strain to urinate?: No Blood in urine?: No Urinary tract infection?: No Sexually transmitted disease?: No Injury to kidneys or bladder?: No Painful intercourse?: No Weak stream?: No Currently pregnant?: No Vaginal bleeding?: No Last menstrual period?: n  Gastrointestinal Nausea?: No Vomiting?: No Indigestion/heartburn?: No Diarrhea?: No Constipation?: No  Constitutional Fever: No Night sweats?: Yes Weight loss?: No Fatigue?: No  Skin Skin rash/lesions?: No Itching?: No  Eyes Blurred vision?: No Double vision?: No  Ears/Nose/Throat Sore throat?: No Sinus problems?: No  Hematologic/Lymphatic Swollen glands?: No Easy bruising?: No  Cardiovascular Leg swelling?: No Chest pain?: No  Respiratory Cough?: No Shortness of breath?: No  Endocrine Excessive thirst?: No  Musculoskeletal Back pain?: No Joint pain?: No  Neurological Headaches?: No Dizziness?: No  Psychologic Depression?: No Anxiety?: Yes  Physical Exam: BP 105/65   Pulse 80   Ht 5\' 4"  (1.626 m)   Wt 195 lb 3.2 oz (88.5 kg)   BMI 33.51 kg/m   Constitutional: Well nourished. Alert and oriented, No acute distress. HEENT: [place] AT, moist mucus membranes. Trachea midline, no  masses. Cardiovascular: No clubbing, cyanosis, or edema. Respiratory: Normal respiratory effort, no increased work of breathing. GI: Abdomen is soft, non tender, non distended, no abdominal masses. Liver and spleen not palpable.  No hernias appreciated.  Stool sample for occult testing is not indicated.   GU: No CVA tenderness.  No bladder fullness or masses.  Atrophic external genitalia, normal pubic hair distribution, no lesions.  Normal urethral meatus, no lesions, no prolapse, no discharge.   No urethral masses, tenderness and/or tenderness. No bladder fullness, tenderness or masses. Pale vagina mucosa, poor estrogen effect, no discharge, no lesions, good pelvic support, no cystocele or rectocele noted.   Anus and perineum are without rashes or lesions.    Skin: No rashes, bruises or suspicious lesions. Lymph: No cervical or inguinal adenopathy. Neurologic: Grossly intact, no focal deficits, moving all 4 extremities. Psychiatric:  Normal mood and affect.  Pertinent Imaging: Results for Shelby PilotSTADLER, Kinzlee BROWN (MRN [id number]) as of 07/22/2017 15:01  Ref. Range 07/22/2017 14:36  Scan Result Unknown 0    Assessment & Plan:    1. Urge incontinence of urine  - had PT in the past and failed Myrbetriq  - offered refer to gynecology for a pessary fitting - not interested as she is going to become sexually active  - Continue Toviaz 8mg  - much improved  - RTC in 12 month for PVR and OAB questionnaire  - Bladder Scan (Post Void Residual) in office  2. SUI  - patient has had PT in the past and does not want to attend again; I have encouraged the patient to undergo physical therapy again as I feel this to be most beneficial in helping her with her stress urinary incontinence, she is agreeable - she went once and did not find it effective  - Toviaz provided great benefit  3. Vaginal atrophy  - Patient to continue the vaginal estrogen cream 3 nights weekly   - RTC in one year    Return in about  1 year (around 07/22/2018) for OAB questionnaire, PVR and exam.  These notes generated with voice recognition software. I apologize for typographical errors.  Michiel CowboySHANNON Shatha Hooser, PA-C  [facility] [address], [address] [place], [place] [address] [phone]

## 2017-07-30 DIAGNOSIS — F0390 Unspecified dementia without behavioral disturbance: Secondary | ICD-10-CM | POA: Insufficient documentation

## 2017-09-17 ENCOUNTER — Ambulatory Visit: Admission: RE | Admit: 2017-09-17 | Payer: Medicare Other | Source: Ambulatory Visit | Admitting: Gastroenterology

## 2017-09-17 ENCOUNTER — Encounter: Admission: RE | Payer: Self-pay | Source: Ambulatory Visit

## 2017-09-17 SURGERY — COLONOSCOPY WITH PROPOFOL
Anesthesia: General

## 2017-11-03 DIAGNOSIS — D696 Thrombocytopenia, unspecified: Secondary | ICD-10-CM | POA: Insufficient documentation

## 2017-12-28 ENCOUNTER — Encounter: Payer: Self-pay | Admitting: *Deleted

## 2017-12-29 ENCOUNTER — Encounter: Payer: Self-pay | Admitting: Anesthesiology

## 2017-12-29 ENCOUNTER — Ambulatory Visit: Payer: Medicare Other | Admitting: Anesthesiology

## 2017-12-29 ENCOUNTER — Ambulatory Visit
Admission: RE | Admit: 2017-12-29 | Discharge: 2017-12-29 | Disposition: A | Discharge status: Home / Self Care | Payer: Medicare Other | Source: Ambulatory Visit | Attending: Gastroenterology | Admitting: Gastroenterology

## 2017-12-29 ENCOUNTER — Encounter
Admission: RE | Disposition: A | Discharge status: Home / Self Care | Payer: Self-pay | Source: Ambulatory Visit | Attending: Gastroenterology

## 2017-12-29 DIAGNOSIS — Z888 Allergy status to other drugs, medicaments and biological substances status: Secondary | ICD-10-CM | POA: Insufficient documentation

## 2017-12-29 DIAGNOSIS — I10 Essential (primary) hypertension: Secondary | ICD-10-CM | POA: Insufficient documentation

## 2017-12-29 DIAGNOSIS — R195 Other fecal abnormalities: Secondary | ICD-10-CM | POA: Insufficient documentation

## 2017-12-29 DIAGNOSIS — Z7982 Long term (current) use of aspirin: Secondary | ICD-10-CM | POA: Insufficient documentation

## 2017-12-29 DIAGNOSIS — K449 Diaphragmatic hernia without obstruction or gangrene: Secondary | ICD-10-CM | POA: Diagnosis not present

## 2017-12-29 DIAGNOSIS — E785 Hyperlipidemia, unspecified: Secondary | ICD-10-CM | POA: Insufficient documentation

## 2017-12-29 DIAGNOSIS — K319 Disease of stomach and duodenum, unspecified: Secondary | ICD-10-CM | POA: Insufficient documentation

## 2017-12-29 DIAGNOSIS — G4730 Sleep apnea, unspecified: Secondary | ICD-10-CM | POA: Insufficient documentation

## 2017-12-29 DIAGNOSIS — E1140 Type 2 diabetes mellitus with diabetic neuropathy, unspecified: Secondary | ICD-10-CM | POA: Diagnosis not present

## 2017-12-29 DIAGNOSIS — Z7984 Long term (current) use of oral hypoglycemic drugs: Secondary | ICD-10-CM | POA: Insufficient documentation

## 2017-12-29 DIAGNOSIS — Z79899 Other long term (current) drug therapy: Secondary | ICD-10-CM | POA: Insufficient documentation

## 2017-12-29 DIAGNOSIS — I429 Cardiomyopathy, unspecified: Secondary | ICD-10-CM | POA: Insufficient documentation

## 2017-12-29 DIAGNOSIS — K6389 Other specified diseases of intestine: Secondary | ICD-10-CM | POA: Insufficient documentation

## 2017-12-29 DIAGNOSIS — D509 Iron deficiency anemia, unspecified: Secondary | ICD-10-CM | POA: Diagnosis not present

## 2017-12-29 DIAGNOSIS — K257 Chronic gastric ulcer without hemorrhage or perforation: Secondary | ICD-10-CM | POA: Diagnosis not present

## 2017-12-29 HISTORY — PX: COLONOSCOPY WITH PROPOFOL: SHX5780

## 2017-12-29 HISTORY — DX: Cardiomyopathy, unspecified: I42.9

## 2017-12-29 HISTORY — DX: Sleep apnea, unspecified: G47.30

## 2017-12-29 HISTORY — DX: Cardiac arrhythmia, unspecified: I49.9

## 2017-12-29 HISTORY — PX: ESOPHAGOGASTRODUODENOSCOPY (EGD) WITH PROPOFOL: SHX5813

## 2017-12-29 HISTORY — DX: Polyneuropathy, unspecified: G62.9

## 2017-12-29 HISTORY — DX: Anemia, unspecified: D64.9

## 2017-12-29 SURGERY — COLONOSCOPY WITH PROPOFOL
Anesthesia: General

## 2017-12-29 MED ORDER — FENTANYL CITRATE (PF) 100 MCG/2ML IJ SOLN: 25.0000 ug | INTRAMUSCULAR | Status: DC | PRN

## 2017-12-29 MED ORDER — FENTANYL CITRATE (PF) 100 MCG/2ML IJ SOLN
INTRAMUSCULAR | Status: DC | PRN
  Administered 2017-12-29 (×2): 25 ug via INTRAVENOUS

## 2017-12-29 MED ORDER — LIDOCAINE HCL (CARDIAC) 20 MG/ML IV SOLN
INTRAVENOUS | Status: DC | PRN
  Administered 2017-12-29: 80 mg via INTRAVENOUS

## 2017-12-29 MED ORDER — ONDANSETRON HCL 4 MG/2ML IJ SOLN: 4.0000 mg | Freq: Once | INTRAMUSCULAR | Status: DC | PRN

## 2017-12-29 MED ORDER — SODIUM CHLORIDE 0.9 % IV SOLN
INTRAVENOUS | Status: DC
  Administered 2017-12-29: 1000 mL via INTRAVENOUS

## 2017-12-29 MED ORDER — PROPOFOL 500 MG/50ML IV EMUL
INTRAVENOUS | Status: AC
  Filled 2017-12-29: qty 50

## 2017-12-29 MED ORDER — PROPOFOL 500 MG/50ML IV EMUL
INTRAVENOUS | Status: DC | PRN
  Administered 2017-12-29: 100 ug/kg/min via INTRAVENOUS

## 2017-12-29 MED ORDER — LACTATED RINGERS IV SOLN
INTRAVENOUS | Status: DC | PRN
  Administered 2017-12-29: 12:00:00 via INTRAVENOUS

## 2017-12-29 MED ORDER — SODIUM CHLORIDE 0.9 % IV SOLN: INTRAVENOUS | Status: DC

## 2017-12-29 MED ORDER — FENTANYL CITRATE (PF) 100 MCG/2ML IJ SOLN
INTRAMUSCULAR | Status: AC
  Filled 2017-12-29: qty 2

## 2017-12-29 MED ORDER — PROPOFOL 10 MG/ML IV BOLUS
INTRAVENOUS | Status: DC | PRN
  Administered 2017-12-29 (×3): 50 mg via INTRAVENOUS

## 2017-12-29 NOTE — Anesthesia Post-op Follow-up Note (Signed)
Anesthesia QCDR form completed.        

## 2017-12-29 NOTE — Op Note (Signed)
[facility] Gastroenterology Patient Name: Shelby [name] Procedure Date: 12/29/2017 11:23 AM MRN: [id number] Account #: [id number] Date of Birth: October 23, 1941 Admit Type: Outpatient Age: 77 Room: [facility] ENDO ROOM 3 Gender: Female Note Status: Finalized Procedure:            Upper GI endoscopy Indications:          Heme positive stool Providers:            Christena Deem, MD Referring MD:         Neomia Dear. Harrington Challenger, MD (Referring MD) Medicines:            Monitored Anesthesia Care Procedure:            Pre-Anesthesia Assessment:                       - ASA Grade Assessment: III - A patient with severe                        systemic disease.                       After obtaining informed consent, the endoscope was                        passed under direct vision. Throughout the procedure,                        the patient's blood pressure, pulse, and oxygen                        saturations were monitored continuously. The Endoscope                        was introduced through the mouth, and advanced to the                        third part of duodenum. The patient tolerated the                        procedure. The upper GI endoscopy was accomplished                        without difficulty. Findings:      The examined esophagus was normal.      A small hiatal hernia was present.      Two small to medium submucosal/ mucosal papules (nodules) with no       bleeding and stigmata of recent bleeding were found at the incisura, in       the gastric antrum, on the lesser curvature of the gastric antrum and on       the posterior wall of the gastric antrum. The lesion on the posterior       wall of the antrum was larger with an atypical "cap" of eschar. Biopsies       were taken with a cold forceps for histology of both lesions separately.       Both bled somewhat more than anticipated, however achieved hemostasis       without intervention otherwise.      The  exam of the stomach was otherwise normal.      The examined duodenum was normal.  The cardia and gastric fundus were normal on retroflexion otherwise. Impression:           - Normal esophagus.                       - Small hiatal hernia.                       - Two submucosal papules (nodules) found in the                        stomach. Biopsied. Recommendation:       - Await pathology results.                       - Consider EUS for further evaluation. Procedure Code(s):    --- Professional ---                       [phone], Esophagogastroduodenoscopy, flexible, transoral;                        with biopsy, single or multiple CPT copyright 2016 American Medical Association. All rights reserved. The codes documented in this report are preliminary and upon coder review may  be revised to meet current compliance requirements. Christena DeemMartin U Skulskie, MD 12/29/2017 12:09:14 PM This report has been signed electronically. Number of Addenda: 0 Note Initiated On: 12/29/2017 11:23 AM      [facility]

## 2017-12-29 NOTE — Progress Notes (Signed)
Procedure aborted at 35 cm colon due to poor prep.

## 2017-12-29 NOTE — Anesthesia Postprocedure Evaluation (Signed)
Anesthesia Post Note  Patient: Shelby PilotCatherine Brown [name]  Procedure(s) Performed: COLONOSCOPY WITH PROPOFOL (N/A ) ESOPHAGOGASTRODUODENOSCOPY (EGD) WITH PROPOFOL (N/A )  Patient location during evaluation: PACU Anesthesia Type: General Level of consciousness: awake and alert and oriented Pain management: pain level controlled Vital Signs Assessment: post-procedure vital signs reviewed and stable Respiratory status: spontaneous breathing Cardiovascular status: blood pressure returned to baseline Anesthetic complications: no     Last Vitals:  Vitals:   12/29/17 1230 12/29/17 1250  BP: (!) 159/107 (!) 161/103  Pulse: 73 62  Resp: 14 17  Temp: 36.4 C   SpO2: 99% 100%    Last Pain:  Vitals:   12/29/17 1230  TempSrc:   PainSc: Asleep                 Zarif Rathje

## 2017-12-29 NOTE — Anesthesia Preprocedure Evaluation (Signed)
Anesthesia Evaluation  Patient identified by MRN, date of birth, ID band Patient awake    Reviewed: Allergy & Precautions, NPO status , Patient's Chart, lab work & pertinent test results, reviewed documented beta blocker date and time   Airway Mallampati: II  TM Distance: >3 FB     Dental  (+) Teeth Intact   Pulmonary sleep apnea ,    Pulmonary exam normal        Cardiovascular hypertension, Pt. on medications and Pt. on home beta blockers +CHF  Normal cardiovascular exam     Neuro/Psych  Neuromuscular disease negative psych ROS   GI/Hepatic negative GI ROS, Neg liver ROS,   Endo/Other  diabetes, Well Controlled, Type 2, Oral Hypoglycemic Agents  Renal/GU Renal InsufficiencyRenal disease     Musculoskeletal   Abdominal Normal abdominal exam  (+)   Peds  Hematology  (+) anemia ,   Anesthesia Other Findings   Reproductive/Obstetrics                             Anesthesia Physical Anesthesia Plan  ASA: III  Anesthesia Plan: General   Post-op Pain Management:    Induction: Intravenous  PONV Risk Score and Plan:   Airway Management Planned: Nasal Cannula  Additional Equipment:   Intra-op Plan:   Post-operative Plan:   Informed Consent: I have reviewed the patients History and Physical, chart, labs and discussed the procedure including the risks, benefits and alternatives for the proposed anesthesia with the patient or authorized representative who has indicated his/her understanding and acceptance.   Dental advisory given  Plan Discussed with: CRNA and Surgeon  Anesthesia Plan Comments:         Anesthesia Quick Evaluation

## 2017-12-29 NOTE — H&P (Signed)
Outpatient short stay form Pre-procedure 12/29/2017 11:21 AM Shelby DeemMartin U Skulskie MD  Primary Physician: Dr. Hal Moralesavid Theis  Reason for visit:  EGD and colonoscopy  History of present illness:  Patient is a 77 year old female presenting today as above. She has a finding of a positive Hemoccult test. She does take iron for iron deficiency anemia. She has no problems with changes of bowel habits or diarrhea. There are no other GI symptoms. Patient tolerated her prep. She takes no aspirin or blood thinning agents with the exception of a 325 mg aspirin that she has now held for several days.    Current Facility-Administered Medications:  .  0.9 %  sodium chloride infusion, , Intravenous, Continuous, Shelby DeemSkulskie, Martin U, MD, Last Rate: 20 mL/hr at 12/29/17 1044, 1,000 mL at 12/29/17 1044 .  0.9 %  sodium chloride infusion, , Intravenous, Continuous, Shelby DeemSkulskie, Martin U, MD .  fentaNYL (SUBLIMAZE) injection 25 mcg, 25 mcg, Intravenous, Q5 min PRN, Yves Dillarroll, Paul, MD .  ondansetron [facility](ZOFRAN) injection 4 mg, 4 mg, Intravenous, Once PRN, Yves Dillarroll, Paul, MD  Medications Prior to Admission  Medication Sig Dispense Refill Last Dose  . brimonidine (ALPHAGAN) 0.2 % ophthalmic solution 3 (three) times daily.   12/29/2017 at Unknown time  . amLODipine (NORVASC) 5 MG tablet Take by mouth.   Taking  . aspirin EC 325 MG tablet Take by mouth.   Taking  . atorvastatin (LIPITOR) 80 MG tablet Take by mouth.   Taking  . candesartan (ATACAND) 32 MG tablet Take by mouth.   Taking  . carvedilol (COREG) 25 MG tablet Take by mouth.   12/27/2017  . conjugated estrogens (PREMARIN) vaginal cream Place 1 Applicatorful vaginally daily. Apply 0.5mg  (pea-sized amount)  just inside the vaginal introitus with a finger-tip every night for two weeks and then Monday, Wednesday and Friday nights. 30 g 12 Taking  . estradiol (ESTRACE VAGINAL) 0.1 MG/GM vaginal cream Apply 0.5mg  (pea-sized amount)  just inside the vaginal introitus with a  finger-tip every night for two weeks and then Monday, Wednesday and Friday nights. (Patient not taking: Reported on 06/08/2017) 30 g 12 Not Taking  . ferrous sulfate 325 (65 FE) MG tablet Take by mouth.   Taking  . fesoterodine (TOVIAZ) 8 MG TB24 tablet Take 1 tablet (8 mg total) by mouth daily. 90 tablet 4 Taking  . metFORMIN (GLUCOPHAGE) 500 MG tablet Take by mouth.   Taking  . Multiple Vitamin (MULTI-VITAMINS) TABS Take by mouth.   Taking  . travoprost, benzalkonium, (TRAVATAN) 0.004 % ophthalmic solution 1 drop at bedtime.   Taking     Allergies  Allergen Reactions  . Mirabegron Other (See Comments)    Elevated BP     Past Medical History:  Diagnosis Date  . Anemia   . Cardiomyopathy (HCC)    Ejection Fraction 30-35% per ECHO 2016  . Diabetes (HCC)   . Dysrhythmia   . Hyperlipidemia   . Hypertension   . Peripheral neuropathy   . Sleep apnea     Review of systems:      Physical Exam    Heart and lungs: Regular rate and rhythm without rub or gallop, lungs are bilaterally clear.    HEENT: Normocephalic atraumatic eyes are anicteric    Other:     Pertinant exam for procedure: Soft nontender nondistended bowel sounds positive normoactive.    Planned proceedures: EGD, colonoscopy and indicated procedures. I have discussed the risks benefits and complications of procedures to include not limited to bleeding,  infection, perforation and the risk of sedation and the patient wishes to proceed.    Shelby Deem, MD Gastroenterology 12/29/2017  11:21 AM

## 2017-12-29 NOTE — Op Note (Signed)
[facility] Gastroenterology Patient Name: Shelby [name] Procedure Date: 12/29/2017 11:23 AM MRN: [id number] Account #: [id number] Date of Birth: 1941-10-22 Admit Type: Outpatient Age: 77 Room: [facility] ENDO ROOM 3 Gender: Female Note Status: Finalized Procedure:            Colonoscopy Indications:          Heme positive stool Providers:            Christena Deem, MD Referring MD:         Neomia Dear. Harrington Challenger, MD (Referring MD) Medicines:            Monitored Anesthesia Care Complications:        No immediate complications. Procedure:            Pre-Anesthesia Assessment:                       - ASA Grade Assessment: III - A patient with severe                        systemic disease.                       After obtaining informed consent, the colonoscope was                        passed under direct vision. Throughout the procedure,                        the patient's blood pressure, pulse, and oxygen                        saturations were monitored continuously. The                        Colonoscope was introduced through the anus with the                        intention of advancing to the cecum. The scope was                        advanced to the sigmoid colon before the procedure was                        aborted. Medications were given. The colonoscopy was                        unusually difficult due to poor bowel prep with stool                        present. The patient tolerated the procedure well. The                        quality of the bowel preparation was poor. Findings:      A diffuse area of moderately melanotic mucosa was found in the entire       colon.      A moderate amount of semi-solid stool was found in the rectum and in the       sigmoid colon, precluding visualization.      The digital rectal exam was normal. Impression:           -  Preparation of the colon was poor.                       - Melanotic mucosa in the entire  examined colon.                       - No specimens collected. Recommendation:       - Discharge patient to home.                       - Discharge patient to home.                       - repeat prep and reschedule. Procedure Code(s):    --- Professional ---                       [id number], 53, Colonoscopy, flexible; diagnostic, including                        collection of specimen(s) by brushing or washing, when                        performed (separate procedure) Diagnosis Code(s):    --- Professional ---                       K63.89, Other specified diseases of intestine                       R19.5, Other fecal abnormalities CPT copyright 2016 American Medical Association. All rights reserved. The codes documented in this report are preliminary and upon coder review may  be revised to meet current compliance requirements. Christena DeemMartin U Lando Alcalde, MD 12/29/2017 12:20:36 PM This report has been signed electronically. Number of Addenda: 0 Note Initiated On: 12/29/2017 11:23 AM Total Procedure Duration: 0 hours 6 minutes 15 seconds       [facility]

## 2017-12-29 NOTE — Transfer of Care (Signed)
Immediate Anesthesia Transfer of Care Note  Patient: Shelby PilotCatherine Brown Dorsi  Procedure(s) Performed: COLONOSCOPY WITH PROPOFOL (N/A ) ESOPHAGOGASTRODUODENOSCOPY (EGD) WITH PROPOFOL (N/A )  Patient Location: PACU  Anesthesia Type:General  Level of Consciousness: awake  Airway & Oxygen Therapy: Patient Spontanous Breathing  Post-op Assessment: Report given to RN  Post vital signs: Reviewed and stable  Last Vitals:  Vitals:   12/29/17 1029 12/29/17 1230  BP: (!) 183/79 (!) 159/107  Pulse: 64 73  Resp: 20 14  Temp: (!) 35.8 C 36.4 C  SpO2: 100% 99%    Last Pain:  Vitals:   12/29/17 1230  TempSrc:   PainSc: Asleep         Complications: No apparent anesthesia complications

## 2017-12-30 ENCOUNTER — Encounter: Payer: Self-pay | Admitting: Gastroenterology

## 2017-12-31 LAB — SURGICAL PATHOLOGY

## 2018-01-05 ENCOUNTER — Other Ambulatory Visit: Payer: Self-pay

## 2018-01-05 MED ORDER — FESOTERODINE FUMARATE ER 8 MG PO TB24: 8.0000 mg | ORAL_TABLET | Freq: Every day | ORAL | 4 refills | Status: DC

## 2018-01-14 DIAGNOSIS — Z0120 Encounter for dental examination and cleaning without abnormal findings: Secondary | ICD-10-CM | POA: Insufficient documentation

## 2018-01-14 DIAGNOSIS — Z710 Person encountering health services to consult on behalf of another person: Secondary | ICD-10-CM | POA: Insufficient documentation

## 2018-01-23 ENCOUNTER — Emergency Department
Admission: EM | Admit: 2018-01-23 | Discharge: 2018-01-23 | Disposition: A | Discharge status: Home / Self Care | Payer: Medicare Other | Attending: Emergency Medicine | Admitting: Emergency Medicine

## 2018-01-23 ENCOUNTER — Encounter: Payer: Self-pay | Admitting: Emergency Medicine

## 2018-01-23 ENCOUNTER — Emergency Department: Payer: Medicare Other

## 2018-01-23 ENCOUNTER — Other Ambulatory Visit: Payer: Self-pay

## 2018-01-23 DIAGNOSIS — E119 Type 2 diabetes mellitus without complications: Secondary | ICD-10-CM | POA: Diagnosis not present

## 2018-01-23 DIAGNOSIS — I10 Essential (primary) hypertension: Secondary | ICD-10-CM | POA: Diagnosis not present

## 2018-01-23 DIAGNOSIS — Z87442 Personal history of urinary calculi: Secondary | ICD-10-CM | POA: Insufficient documentation

## 2018-01-23 DIAGNOSIS — N23 Unspecified renal colic: Secondary | ICD-10-CM | POA: Diagnosis not present

## 2018-01-23 DIAGNOSIS — Z7984 Long term (current) use of oral hypoglycemic drugs: Secondary | ICD-10-CM | POA: Insufficient documentation

## 2018-01-23 DIAGNOSIS — Z79899 Other long term (current) drug therapy: Secondary | ICD-10-CM | POA: Insufficient documentation

## 2018-01-23 DIAGNOSIS — R1030 Lower abdominal pain, unspecified: Secondary | ICD-10-CM | POA: Diagnosis present

## 2018-01-23 LAB — COMPREHENSIVE METABOLIC PANEL
ALK PHOS: 51 U/L (ref 38–126)
ALT: 21 U/L (ref 14–54)
AST: 32 U/L (ref 15–41)
Albumin: 4.1 g/dL (ref 3.5–5.0)
Anion gap: 11 (ref 5–15)
BUN: 27 mg/dL — ABNORMAL HIGH (ref 6–20)
CALCIUM: 10 mg/dL (ref 8.9–10.3)
CO2: 29 mmol/L (ref 22–32)
CREATININE: 1.46 mg/dL — AB (ref 0.44–1.00)
Chloride: 100 mmol/L — ABNORMAL LOW (ref 101–111)
GFR, EST AFRICAN AMERICAN: 40 mL/min — AB (ref >60)
GFR, EST NON AFRICAN AMERICAN: 34 mL/min — AB (ref >60)
Glucose, Bld: 101 mg/dL — ABNORMAL HIGH (ref 65–99)
Potassium: 3.8 mmol/L (ref 3.5–5.1)
SODIUM: 140 mmol/L (ref 135–145)
Total Bilirubin: 1 mg/dL (ref 0.3–1.2)
Total Protein: 7.7 g/dL (ref 6.5–8.1)

## 2018-01-23 LAB — URINALYSIS, COMPLETE (UACMP) WITH MICROSCOPIC
Bacteria, UA: NONE SEEN
Bilirubin Urine: NEGATIVE
GLUCOSE, UA: NEGATIVE mg/dL
Ketones, ur: NEGATIVE mg/dL
Leukocytes, UA: NEGATIVE
Nitrite: NEGATIVE
PH: 5 (ref 5.0–8.0)
Protein, ur: 100 mg/dL — AB
SPECIFIC GRAVITY, URINE: 1.017 (ref 1.005–1.030)

## 2018-01-23 LAB — CBC
HCT: 39.7 % (ref 35.0–47.0)
Hemoglobin: 12.9 g/dL (ref 12.0–16.0)
MCH: 29.5 pg (ref 26.0–34.0)
MCHC: 32.5 g/dL (ref 32.0–36.0)
MCV: 90.8 fL (ref 80.0–100.0)
PLATELETS: 192 10*3/uL (ref 150–440)
RBC: 4.38 MIL/uL (ref 3.80–5.20)
RDW: 16.7 % — AB (ref 11.5–14.5)
WBC: 7.8 10*3/uL (ref 3.6–11.0)

## 2018-01-23 LAB — LIPASE, BLOOD: Lipase: 36 U/L (ref 11–51)

## 2018-01-23 MED ORDER — OXYCODONE-ACETAMINOPHEN 5-325 MG PO TABS: 1.0000 | ORAL_TABLET | Freq: Four times a day (QID) | ORAL | 0 refills | Status: DC | PRN

## 2018-01-23 NOTE — Discharge Instructions (Signed)
you have a kidney stone. Please take the Percocet if she needed for severe pain, One 4 times a day. You can take to 4 times a day if need be. Please be careful though he can make you sleepy and constipated. You cannot drive after taking the Percocet. Be careful not to fall if you take it. [facility] Dr. Apolinar JunesBrandon. NU have some kidney stones. They can follow up later on in the week or so. Return here for severe pain in the Percocet is not controlling nausea vomiting fever or feeling sicker. We also saw a lung nodule. This will have to be followed up by your regular doctor. He may want to do a CAT scan in 6-12 months to check and make sure it hasn't changed any.

## 2018-01-23 NOTE — ED Provider Notes (Signed)
[facility] Emergency Department Provider Note   ____________________________________________   First MD Initiated Contact with Patient 01/23/18 0957     (approximate)  I have reviewed the triage vital signs and the nursing notes.   HISTORY  Chief Complaint Abdominal Pain   HPI Shelby [name] is a 77 y.o. female Patient reported severe lower abdominal cramping yesterday and blood in the urine. Pain went away she feels fine now. She has no history of kidney stones   Past Medical History:  Diagnosis Date  . Anemia   . Cardiomyopathy (HCC)    Ejection Fraction 30-35% per ECHO 2016  . Diabetes (HCC)   . Dysrhythmia   . Hyperlipidemia   . Hypertension   . Peripheral neuropathy   . Sleep apnea     Patient Active Problem List   Diagnosis Date Noted  . High risk medication use 12/11/2015  . Mass of right elbow 10/04/2015  . Left lumbar radiculopathy 12/05/2014  . Anemia 06/12/2014  . Cardiomyopathy (HCC) 06/12/2014  . Diabetes mellitus with diabetic retinopathy 06/12/2014  . Benign essential hypertension 06/12/2014  . Hyperlipidemia 06/12/2014  . Obstructive sleep apnea 06/12/2014  . Peripheral neuropathy 06/12/2014  . Proteinuria 06/12/2014  . 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HCC) 06/12/2014  . 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 Vitamin B 12 deficiency 06/12/2014  . Vitamin D deficiency 06/12/2014    Past Surgical History:  Procedure Laterality Date  . ABDOMINAL HYSTERECTOMY    . COLONOSCOPY    . COLONOSCOPY WITH PROPOFOL N/A 12/29/2017   Procedure: COLONOSCOPY WITH PROPOFOL;  Surgeon: Christena Deem, MD;  Location: [facility] ENDOSCOPY;  Service: Endoscopy;  Laterality: N/A;  . ESOPHAGOGASTRODUODENOSCOPY (EGD) WITH PROPOFOL N/A 12/29/2017   Procedure: ESOPHAGOGASTRODUODENOSCOPY (EGD) WITH PROPOFOL;  Surgeon: Christena Deem, MD;  Location: [facility] ENDOSCOPY;   Service: Endoscopy;  Laterality: N/A;    Prior to Admission medications   Medication Sig Start Date End Date Taking? Authorizing Provider  amLODipine (NORVASC) 5 MG tablet Take by mouth. 12/11/15   [provider]  aspirin EC 325 MG tablet Take by mouth.    [provider]  atorvastatin (LIPITOR) 80 MG tablet Take by mouth. 12/11/15   [provider]  brimonidine (ALPHAGAN) 0.2 % ophthalmic solution 3 (three) times daily.    [provider]  candesartan (ATACAND) 32 MG tablet Take by mouth. 07/15/16   [provider]  carvedilol (COREG) 25 MG tablet Take by mouth. 07/15/16   [provider]  conjugated estrogens (PREMARIN) vaginal cream Place 1 Applicatorful vaginally daily. Apply 0.5mg  (pea-sized amount)  just inside the vaginal introitus with a finger-tip every night for two weeks and then Monday, Wednesday and Friday nights. 06/08/17   Michiel Cowboy A, PA-C  estradiol (ESTRACE VAGINAL) 0.1 MG/GM vaginal cream Apply 0.5mg  (pea-sized amount)  just inside the vaginal introitus with a finger-tip every night for two weeks and then Monday, Wednesday and Friday nights. Patient not taking: Reported on 06/08/2017 07/29/16   Michiel Cowboy A, PA-C  ferrous sulfate 325 (65 FE) MG tablet Take by mouth.    [provider]  fesoterodine (TOVIAZ) 8 MG TB24 tablet Take 1 tablet (8 mg total) by mouth daily. 01/05/18   Michiel Cowboy A, PA-C  metFORMIN (GLUCOPHAGE) 500 MG tablet Take by mouth. 07/15/16 07/22/17  [provider]  Multiple Vitamin (MULTI-VITAMINS) TABS Take by mouth.    [provider]  oxyCODONE-acetaminophen (PERCOCET/ROXICET)  5-325 MG tablet Take 1 tablet by mouth every 6 (six) hours as needed for severe pain. 01/23/18   Arnaldo NatalMalinda, Emir Nack F, MD  travoprost, benzalkonium, (TRAVATAN) 0.004 % ophthalmic solution 1 drop at bedtime.    [provider]    Allergies Mirabegron  Family History  Problem Relation Age of  Onset  . Diabetes Mother   . Heart disease Mother   . Bladder Cancer Neg Hx   . Kidney cancer Neg Hx     Social History Social History   Tobacco Use  . Smoking status: Never Smoker  . Smokeless tobacco: Never Used  Substance Use Topics  . Alcohol use: No  . Drug use: No    Review of Systems  Constitutional: No fever/chills Eyes: No visual changes. ENT: No sore throat. Cardiovascular: Denies chest pain. Respiratory: Denies shortness of breath. Gastrointestinal: at present No abdominal pain.  No nausea, no vomiting.  No diarrhea.  No constipation. Genitourinary: Negative for dysuria. Musculoskeletal: Negative for back pain. Skin: Negative for rash. Neurological: Negative for headaches, focal weakness   ____________________________________________   PHYSICAL EXAM:  VITAL SIGNS: ED Triage Vitals  Enc Vitals Group     BP 01/23/18 0735 131/61     Pulse Rate 01/23/18 0735 73     Resp 01/23/18 0735 20     Temp 01/23/18 0735 98.2 F (36.8 C)     Temp Source 01/23/18 0735 Oral     SpO2 01/23/18 0735 100 %     Weight 01/23/18 0733 204 lb (92.5 kg)     Height 01/23/18 0733 5\' 4"  (1.626 m)     Head Circumference --      Peak Flow --      Pain Score --      Pain Loc --      Pain Edu? --      Excl. in GC? --     Constitutional: Alert and oriented. Well appearing and in no acute distress. Eyes: Conjunctivae are normal. Head: Atraumatic. Nose: No congestion/rhinnorhea. Mouth/Throat: Mucous membranes are moist.  Oropharynx non-erythematous. Neck: No stridor.   Cardiovascular: Normal rate, regular rhythm. Grossly normal heart sounds.  Good peripheral circulation. Respiratory: Normal respiratory effort.  No retractions. Lungs CTAB. Gastrointestinal: Soft and nontender. No distention. No abdominal bruits. No CVA tenderness. Musculoskeletal: No lower extremity tenderness nor edema.  No joint effusions. Neurologic:  Normal speech and language. Skin:  Skin is warm, dry and  intact. No rash noted. Psychiatric: Mood and affect are normal. Speech and behavior are normal.  ____________________________________________   LABS (all labs ordered are listed, but only abnormal results are displayed)  Labs Reviewed  COMPREHENSIVE METABOLIC PANEL - Abnormal; Notable for the following components:      Result Value   Chloride 100 (*)    Glucose, Bld 101 (*)    BUN 27 (*)    Creatinine, Ser 1.46 (*)    GFR calc non Af Amer 34 (*)    GFR calc Af Amer 40 (*)    All other components within normal limits  CBC - Abnormal; Notable for the following components:   RDW 16.7 (*)    All other components within normal limits  URINALYSIS, COMPLETE (UACMP) WITH MICROSCOPIC - Abnormal; Notable for the following components:   Color, Urine YELLOW (*)    APPearance CLOUDY (*)    Hgb urine dipstick LARGE (*)    Protein, ur 100 (*)    Squamous Epithelial / LPF 6-30 (*)    All  other components within normal limits  LIPASE, BLOOD   ____________________________________________  EKG   ____________________________________________  RADIOLOGY  ED MD interpretation:  CT shows a distal ureter 3 mm stone. Additionally there is a nodule in the lung will need CT follow-up in 6-12 months. And there are some more stones in the kidneys.  Official radiology report(s): Ct Renal Stone Study  Result Date: 01/23/2018 CLINICAL DATA:  Lower abdominal cramping and hematuria. No history of kidney stones. EXAM: CT ABDOMEN AND PELVIS WITHOUT CONTRAST TECHNIQUE: Multidetector CT imaging of the abdomen and pelvis was performed following the standard protocol without IV contrast. COMPARISON:  None. FINDINGS: Lower chest: 6 mm pulmonary nodule in the left lower lobe (series 4, image 10). Mild left atrial enlargement with small pericardial effusion. Hepatobiliary: No focal liver abnormality. Cholelithiasis. No gallbladder wall thickening or biliary dilatation. Pancreas: Unremarkable. No pancreatic ductal  dilatation or surrounding inflammatory changes. Spleen: Normal in size without focal abnormality. Adrenals/Urinary Tract: The adrenal glands are unremarkable. Two subcentimeter hyperdense lesions arising from the lower pole of the left kidney are too small to characterize. Punctate bilateral renal calculi. There is a 3 mm calculus in the distal left ureter with resultant mild left hydroureteronephrosis and mild left perinephric fat stranding. Stomach/Bowel: Small hiatal hernia. The stomach is otherwise within normal limits. No evidence of bowel wall thickening, distention, or surrounding inflammatory changes. Mild left-sided colonic diverticulosis. Appendix is not definitively visualized, however there are no secondary signs of inflammatory process in the right lower quadrant. Vascular/Lymphatic: Aortic atherosclerosis. No enlarged abdominal or pelvic lymph nodes. Reproductive: Status post hysterectomy. No adnexal masses. Other: Small bilateral fat containing inguinal hernias. No free fluid or pneumoperitoneum. Musculoskeletal: No acute or significant osseous findings. IMPRESSION: 1. 3 mm calculus in the distal left ureter with resultant mild left hydroureteronephrosis. 2. Additional punctate nonobstructive bilateral nephrolithiasis. 3. Cholelithiasis. 4. 6 mm pulmonary nodule in the left lower lobe. Non-contrast chest CT at 6-12 months is recommended. If the nodule is stable at time of repeat CT, then future CT at 18-24 months (from today's scan) is considered optional for low-risk patients, but is recommended for high-risk patients. This recommendation follows the consensus statement: Guidelines for Management of Incidental Pulmonary Nodules Detected on CT Images: From the Fleischner Society 2017; Radiology 2017; 284:228-243. 5.  Aortic atherosclerosis (ICD10-I70.0). Electronically Signed   By: Obie Dredge M.D.   On: 01/23/2018 11:22     ____________________________________________   PROCEDURES  Procedure(s) performed:   Procedures  Critical Care performed:   ____________________________________________   INITIAL IMPRESSION / ASSESSMENT AND PLAN / ED COURSE  old records reviewed. Discussed with patient the fact that she has a lung nodule which will need follow-up and several kidney stones. I will have her follow with urology. Return for fever vomiting or feeling sicker.     Clinical Course as of Jan 23 1135  Sat Jan 23, 2018  1127 Potassium: 3.8 [PM]    Clinical Course User Index [PM] Arnaldo Natal, MD     ____________________________________________   FINAL CLINICAL IMPRESSION(S) / ED DIAGNOSES  Final diagnoses:  Renal colic     ED Discharge Orders        Ordered    oxyCODONE-acetaminophen (PERCOCET/ROXICET) 5-325 MG tablet  Every 6 hours PRN     01/23/18 1133       Note:  This document was prepared using Dragon voice recognition software and may include unintentional dictation errors.    Arnaldo Natal, MD 01/23/18 1136

## 2018-01-23 NOTE — ED Triage Notes (Addendum)
Patient reports lower abd cramping since yesterday. Patient also reports seeing blood in urine. Patient denies any current pain, denies any history of kidney stone. Denies BM yesterday, but had BM today.

## 2018-01-23 NOTE — ED Notes (Signed)

## 2018-02-16 ENCOUNTER — Ambulatory Visit (INDEPENDENT_AMBULATORY_CARE_PROVIDER_SITE_OTHER): Payer: Medicare Other | Admitting: Urology

## 2018-02-16 ENCOUNTER — Encounter: Payer: Self-pay | Admitting: Urology

## 2018-02-16 VITALS — BP 123/74 | HR 65 | Resp 16 | Ht 64.0 in | Wt 209.0 lb

## 2018-02-16 DIAGNOSIS — N201 Calculus of ureter: Secondary | ICD-10-CM | POA: Diagnosis not present

## 2018-02-16 DIAGNOSIS — N1330 Unspecified hydronephrosis: Secondary | ICD-10-CM

## 2018-02-16 DIAGNOSIS — R310 Gross hematuria: Secondary | ICD-10-CM | POA: Diagnosis not present

## 2018-02-16 DIAGNOSIS — R911 Solitary pulmonary nodule: Secondary | ICD-10-CM

## 2018-02-16 LAB — URINALYSIS, COMPLETE
BILIRUBIN UA: NEGATIVE
Glucose, UA: NEGATIVE
Ketones, UA: NEGATIVE
Leukocytes, UA: NEGATIVE
NITRITE UA: NEGATIVE
PH UA: 5.5 (ref 5.0–7.5)
PROTEIN UA: NEGATIVE
RBC, UA: NEGATIVE
Specific Gravity, UA: 1.025 (ref 1.005–1.030)
UUROB: 0.2 mg/dL (ref 0.2–1.0)

## 2018-02-16 LAB — MICROSCOPIC EXAMINATION
RBC MICROSCOPIC, UA: NONE SEEN /HPF (ref 0–?)
WBC UA: NONE SEEN /HPF (ref 0–?)

## 2018-02-16 NOTE — Progress Notes (Signed)
02/16/2018 10:36 AM   Shelby [name] [birth year] [id number]  Referring provider: Mickey Farberhies, David, MD [address] [facility] [place], [place] [id number]  No chief complaint on file.   HPI: Patient is a 77 year old African American female with mixed urinary incontinence and vaginal atrophy who follow ups today after [facility] for gross hematuria and 3 mm calculus in the distal left ureter.    [facility] January 23, 2018 after she experienced some lower abdominal pain and gross hematuria.  Her WBC count was 7.8.  Her UA was positive for too numerous to count RBCs and 6-30 WBCs.  Her creatinine was 1.46.  CT Renal stone study performed on 01/23/2018 noted a 3 mm calculus in the distal left ureter with resultant mild left hydroureteronephrosis.  Additional punctate nonobstructive bilateral nephrolithiasis.  Cholelithiasis.  6 mm pulmonary nodule in the left lower lobe. Non-contrast chest CT at 6-12 months is recommended. If the nodule is stable at time of repeat CT, then future CT at 18-24 months (from today's scan) is considered optional for low-risk patients, but is recommended for high-risk patients. This recommendation follows the consensus statement: Guidelines for Management of Incidental Pulmonary Nodules Detected on CT Images: From the Fleischner Society 2017; Radiology 2017; 284:228-243.  Aortic atherosclerosis (ICD10-I70.0).  Today, she is experiencing frequency, nocturia and incontinence.  Patient denies any gross hematuria, dysuria or suprapubic/flank pain.  Patient denies any fevers, chills, nausea or vomiting.   Her UA is negative.    PMH: Past Medical History:  Diagnosis Date  . Anemia   . Cardiomyopathy (HCC)    Ejection Fraction 30-35% per ECHO 2016  . Diabetes (HCC)   . Dysrhythmia   . Hyperlipidemia   . Hypertension   . Peripheral neuropathy   . Sleep apnea     Surgical History: Past Surgical History:    Procedure Laterality Date  . ABDOMINAL HYSTERECTOMY    . COLONOSCOPY    . COLONOSCOPY WITH PROPOFOL N/A 12/29/2017   Procedure: COLONOSCOPY WITH PROPOFOL;  Surgeon: Christena DeemSkulskie, Martin U, MD;  [facility];  Service: Endoscopy;  Laterality: N/A;  . ESOPHAGOGASTRODUODENOSCOPY (EGD) WITH PROPOFOL N/A 12/29/2017   Procedure: ESOPHAGOGASTRODUODENOSCOPY (EGD) WITH PROPOFOL;  Surgeon: Christena DeemSkulskie, Martin U, MD;  [facility];  Service: Endoscopy;  Laterality: N/A;    Home Medications:  Allergies as of 02/16/2018      Reactions   Mirabegron Other (See Comments)   Elevated BP      Medication List        Accurate as of 02/16/18 10:36 AM. Always use your most recent med list.          amLODipine 5 MG tablet Commonly known as:  NORVASC Take by mouth.   aspirin EC 325 MG tablet Take by mouth.   atorvastatin 80 MG tablet Commonly known as:  LIPITOR Take by mouth.   brimonidine 0.2 % ophthalmic solution Commonly known as:  ALPHAGAN 3 (three) times daily.   candesartan 32 MG tablet Commonly known as:  ATACAND Take by mouth.   carvedilol 25 MG tablet Commonly known as:  COREG Take by mouth.   conjugated estrogens vaginal cream Commonly known as:  PREMARIN Place 1 Applicatorful vaginally daily. Apply 0.5mg  (pea-sized amount)  just inside the vaginal introitus with a finger-tip every night for two weeks and then Monday, Wednesday and Friday nights.   estradiol 0.1 MG/GM vaginal cream Commonly known as:  ESTRACE  VAGINAL Apply 0.5mg  (pea-sized amount)  just inside the vaginal introitus with a finger-tip every night for two weeks and then Monday, Wednesday and Friday nights.   ferrous sulfate 325 (65 FE) MG tablet Take by mouth.   fesoterodine 8 MG Tb24 tablet Commonly known as:  TOVIAZ Take 1 tablet (8 mg total) by mouth daily.   metFORMIN 500 MG tablet Commonly known as:  GLUCOPHAGE Take by mouth.   MULTI-VITAMINS Tabs Take by mouth.    oxyCODONE-acetaminophen 5-325 MG tablet Commonly known as:  PERCOCET/ROXICET Take 1 tablet by mouth every 6 (six) hours as needed for severe pain.   travoprost (benzalkonium) 0.004 % ophthalmic solution Commonly known as:  TRAVATAN 1 drop at bedtime.       Allergies:  Allergies  Allergen Reactions  . Mirabegron Other (See Comments)    Elevated BP    Family History: Family History  Problem Relation Age of Onset  . Diabetes Mother   . Heart disease Mother   . Bladder Cancer Neg Hx   . Kidney cancer Neg Hx     Social History:  reports that  has never smoked. she has never used smokeless tobacco. She reports that she does not drink alcohol or use drugs.  ROS: UROLOGY Frequent Urination?: Yes Hard to postpone urination?: No Burning/pain with urination?: No Get up at night to urinate?: Yes Leakage of urine?: Yes Urine stream starts and stops?: No Trouble starting stream?: No Do you have to strain to urinate?: No Blood in urine?: No Urinary tract infection?: No Sexually transmitted disease?: No Injury to kidneys or bladder?: No Painful intercourse?: No Weak stream?: No Currently pregnant?: No Vaginal bleeding?: No Last menstrual period?: n  Gastrointestinal Nausea?: No Vomiting?: No Indigestion/heartburn?: No Diarrhea?: No Constipation?: No  Constitutional Fever: No Night sweats?: Yes Weight loss?: No Fatigue?: No  Skin Skin rash/lesions?: No Itching?: No  Eyes Blurred vision?: No Double vision?: No  Ears/Nose/Throat Sore throat?: No Sinus problems?: No  Hematologic/Lymphatic Swollen glands?: No Easy bruising?: No  Cardiovascular Leg swelling?: Yes Chest pain?: No  Respiratory Cough?: No Shortness of breath?: No  Endocrine Excessive thirst?: No  Musculoskeletal Back pain?: Yes Joint pain?: Yes  Neurological Headaches?: No Dizziness?: No  Psychologic Depression?: No Anxiety?: No  Physical Exam: BP 123/74   Pulse 65    Resp 16   Ht 5\' 4"  (1.626 m)   Wt 209 lb (94.8 kg)   SpO2 98%   BMI 35.87 kg/m   Constitutional: Well nourished. Alert and oriented, No acute distress. HEENT: [place] AT, moist mucus membranes. Trachea midline, no masses. Cardiovascular: No clubbing, cyanosis, or edema. Respiratory: Normal respiratory effort, no increased work of breathing. GI: Abdomen is soft, non tender, non distended, no abdominal masses. Liver and spleen not palpable.  No hernias appreciated.  Stool sample for occult testing is not indicated.   GU: No CVA tenderness.  No bladder fullness or masses.   Skin: No rashes, bruises or suspicious lesions. Lymph: No cervical or inguinal adenopathy. Neurologic: Grossly intact, no focal deficits, moving all 4 extremities. Psychiatric: Normal mood and affect.  Pertinent Imaging: CLINICAL DATA:  Lower abdominal cramping and hematuria. No history of kidney stones.  EXAM: CT ABDOMEN AND PELVIS WITHOUT CONTRAST  TECHNIQUE: Multidetector CT imaging of the abdomen and pelvis was performed following the standard protocol without IV contrast.  COMPARISON:  None.  FINDINGS: Lower chest: 6 mm pulmonary nodule in the left lower lobe (series 4, image 10). Mild left atrial enlargement with  small pericardial effusion.  Hepatobiliary: No focal liver abnormality. Cholelithiasis. No gallbladder wall thickening or biliary dilatation.  Pancreas: Unremarkable. No pancreatic ductal dilatation or surrounding inflammatory changes.  Spleen: Normal in size without focal abnormality.  Adrenals/Urinary Tract: The adrenal glands are unremarkable. Two subcentimeter hyperdense lesions arising from the lower pole of the left kidney are too small to characterize. Punctate bilateral renal calculi. There is a 3 mm calculus in the distal left ureter with resultant mild left hydroureteronephrosis and mild left perinephric fat stranding.  Stomach/Bowel: Small hiatal hernia. The stomach is  otherwise within normal limits. No evidence of bowel wall thickening, distention, or surrounding inflammatory changes. Mild left-sided colonic diverticulosis. Appendix is not definitively visualized, however there are no secondary signs of inflammatory process in the right lower quadrant.  Vascular/Lymphatic: Aortic atherosclerosis. No enlarged abdominal or pelvic lymph nodes.  Reproductive: Status post hysterectomy. No adnexal masses.  Other: Small bilateral fat containing inguinal hernias. No free fluid or pneumoperitoneum.  Musculoskeletal: No acute or significant osseous findings.  IMPRESSION: 1. 3 mm calculus in the distal left ureter with resultant mild left hydroureteronephrosis. 2. Additional punctate nonobstructive bilateral nephrolithiasis. 3. Cholelithiasis. 4. 6 mm pulmonary nodule in the left lower lobe. Non-contrast chest CT at 6-12 months is recommended. If the nodule is stable at time of repeat CT, then future CT at 18-24 months (from today's scan) is considered optional for low-risk patients, but is recommended for high-risk patients. This recommendation follows the consensus statement: Guidelines for Management of Incidental Pulmonary Nodules Detected on CT Images: From the Fleischner Society 2017; Radiology 2017; 284:228-243. 5.  Aortic atherosclerosis (ICD10-I70.0).   Electronically Signed   By: Obie Dredge M.D.   On: 01/23/2018 11:22  I have independently reviewed the films  Assessment & Plan:    1. Left ureteral stone  - most likely passed as patient is symptom free  2. Left hydronephrosis  - will order a RUS to ensure the hydronephrosis has resolved.    3. Gross hematuria  - likely due to passage of stone  - UA today is negative  4. Pulmonary nodule CT of chest in 6 months    Return for RUS report .  These notes generated with voice recognition software. I apologize for typographical errors.  Michiel Cowboy,  PA-C  [facility] [address] [place], [place] [address] [phone]

## 2018-02-17 LAB — BASIC METABOLIC PANEL
BUN / CREAT RATIO: 18 (ref 12–28)
BUN: 24 mg/dL (ref 8–27)
CO2: 26 mmol/L (ref 20–29)
CREATININE: 1.33 mg/dL — AB (ref 0.57–1.00)
Calcium: 9.9 mg/dL (ref 8.7–10.3)
Chloride: 105 mmol/L (ref 96–106)
GFR calc Af Amer: 45 mL/min/{1.73_m2} — ABNORMAL LOW (ref >59)
GFR, EST NON AFRICAN AMERICAN: 39 mL/min/{1.73_m2} — AB (ref >59)
Glucose: 97 mg/dL (ref 65–99)
Potassium: 4.9 mmol/L (ref 3.5–5.2)
Sodium: 141 mmol/L (ref 134–144)

## 2018-02-25 ENCOUNTER — Ambulatory Visit
Admission: RE | Admit: 2018-02-25 | Discharge: 2018-02-25 | Disposition: A | Discharge status: Home / Self Care | Payer: Medicare Other | Source: Ambulatory Visit | Attending: Urology | Admitting: Urology

## 2018-02-25 DIAGNOSIS — Z87442 Personal history of urinary calculi: Secondary | ICD-10-CM | POA: Diagnosis not present

## 2018-02-25 DIAGNOSIS — N281 Cyst of kidney, acquired: Secondary | ICD-10-CM | POA: Insufficient documentation

## 2018-02-25 DIAGNOSIS — N132 Hydronephrosis with renal and ureteral calculous obstruction: Secondary | ICD-10-CM

## 2018-03-08 ENCOUNTER — Ambulatory Visit: Payer: Medicare Other | Attending: Family | Admitting: Family

## 2018-03-08 ENCOUNTER — Encounter: Payer: Self-pay | Admitting: Family

## 2018-03-08 VITALS — BP 132/80 | HR 73 | Resp 18 | Ht 64.0 in | Wt 215.5 lb

## 2018-03-08 DIAGNOSIS — Z888 Allergy status to other drugs, medicaments and biological substances status: Secondary | ICD-10-CM | POA: Diagnosis not present

## 2018-03-08 DIAGNOSIS — E785 Hyperlipidemia, unspecified: Secondary | ICD-10-CM | POA: Insufficient documentation

## 2018-03-08 DIAGNOSIS — Z90710 Acquired absence of both cervix and uterus: Secondary | ICD-10-CM | POA: Diagnosis not present

## 2018-03-08 DIAGNOSIS — Z833 Family history of diabetes mellitus: Secondary | ICD-10-CM | POA: Insufficient documentation

## 2018-03-08 DIAGNOSIS — Z8249 Family history of ischemic heart disease and other diseases of the circulatory system: Secondary | ICD-10-CM | POA: Insufficient documentation

## 2018-03-08 DIAGNOSIS — I5022 Chronic systolic (congestive) heart failure: Secondary | ICD-10-CM | POA: Insufficient documentation

## 2018-03-08 DIAGNOSIS — Z79899 Other long term (current) drug therapy: Secondary | ICD-10-CM | POA: Diagnosis not present

## 2018-03-08 DIAGNOSIS — I110 Hypertensive heart disease with heart failure: Secondary | ICD-10-CM | POA: Diagnosis not present

## 2018-03-08 DIAGNOSIS — D649 Anemia, unspecified: Secondary | ICD-10-CM | POA: Insufficient documentation

## 2018-03-08 DIAGNOSIS — Z7982 Long term (current) use of aspirin: Secondary | ICD-10-CM | POA: Diagnosis not present

## 2018-03-08 DIAGNOSIS — E1140 Type 2 diabetes mellitus with diabetic neuropathy, unspecified: Secondary | ICD-10-CM | POA: Insufficient documentation

## 2018-03-08 DIAGNOSIS — I509 Heart failure, unspecified: Secondary | ICD-10-CM | POA: Diagnosis present

## 2018-03-08 DIAGNOSIS — I10 Essential (primary) hypertension: Secondary | ICD-10-CM

## 2018-03-08 DIAGNOSIS — I429 Cardiomyopathy, unspecified: Secondary | ICD-10-CM | POA: Diagnosis not present

## 2018-03-08 DIAGNOSIS — E1129 Type 2 diabetes mellitus with other diabetic kidney complication: Secondary | ICD-10-CM

## 2018-03-08 DIAGNOSIS — G4733 Obstructive sleep apnea (adult) (pediatric): Secondary | ICD-10-CM

## 2018-03-08 LAB — GLUCOSE, CAPILLARY: GLUCOSE-CAPILLARY: 93 mg/dL (ref 65–99)

## 2018-03-08 MED ORDER — FUROSEMIDE 20 MG PO TABS: 20.0000 mg | ORAL_TABLET | Freq: Every day | ORAL | 3 refills | Status: DC

## 2018-03-08 NOTE — Patient Instructions (Addendum)
Begin weighing daily and call for an overnight weight gain of > 2 pounds or a weekly weight gain of >5 pounds.  Adding furosemide (lasix) once daily.   Begin wearing compression socks and your CPAP

## 2018-03-08 NOTE — Progress Notes (Signed)
Patient ID: Shelby [name], female    DOB: October 13, 1941, 77 y.o.   MRN: [id number]  HPI  Shelby [name] is a 77 y/o female with a history of DM, hyperlipidemia, HTN, anemia, obstructive sleep apnea, neuropathy and chronic heart failure.   Echo report from 02/13/15 reviewed and showed an EF of 35% along with moderate MR and mild PHTN. Function study on 03/16/15 showed an EF of 52%.   Was in the ED 01/23/18 due to renal colic where she was treated and released.   She presents today for her initial visit from [facility] with a chief complaint of swelling in her lower legs. She says this is chronic in nature having been present for several months although appears to be worsening. Says that she was on lasix a few years ago. She has associated moderate fatigue, light-headedness and neuropathy along with this. She denies any difficulty sleeping, abdominal distention, palpitations, chest pain or shortness of breath. Hasn't been weighing herself at home.   Past Medical History:  Diagnosis Date  . Anemia   . Cardiomyopathy (HCC)    Ejection Fraction 30-35% per ECHO 2016  . CHF (congestive heart failure) (HCC)   . Diabetes (HCC)   . Dysrhythmia   . Hyperlipidemia   . Hypertension   . Peripheral neuropathy   . Sleep apnea    Past Surgical History:  Procedure Laterality Date  . ABDOMINAL HYSTERECTOMY    . COLONOSCOPY    . COLONOSCOPY WITH PROPOFOL N/A 12/29/2017   Procedure: COLONOSCOPY WITH PROPOFOL;  Surgeon: Christena Deem, MD;  Location: [facility] ENDOSCOPY;  Service: Endoscopy;  Laterality: N/A;  . ESOPHAGOGASTRODUODENOSCOPY (EGD) WITH PROPOFOL N/A 12/29/2017   Procedure: ESOPHAGOGASTRODUODENOSCOPY (EGD) WITH PROPOFOL;  Surgeon: Christena Deem, MD;  Location: [facility] ENDOSCOPY;  Service: Endoscopy;  Laterality: N/A;   Family History  Problem Relation Age of Onset  . Diabetes Mother   . Heart disease Mother   . Bladder Cancer Neg Hx   . Kidney cancer Neg Hx    Social  History   Tobacco Use  . Smoking status: Never Smoker  . Smokeless tobacco: Never Used  Substance Use Topics  . Alcohol use: No   Allergies  Allergen Reactions  . Mirabegron Other (See Comments)    Elevated BP   Prior to Admission medications   Medication Sig Start Date End Date Taking? Authorizing Provider  amLODipine (NORVASC) 5 MG tablet Take by mouth daily.  12/11/15  Yes [provider]  aspirin EC 81 MG tablet Take 81 mg by mouth daily.   Yes [provider]  atorvastatin (LIPITOR) 80 MG tablet Take by mouth. 12/11/15  Yes [provider]  brimonidine (ALPHAGAN) 0.2 % ophthalmic solution Place 1 drop into both eyes 3 (three) times daily. Takes BID   Yes [provider]  candesartan (ATACAND) 32 MG tablet Take by mouth daily.  07/15/16  Yes [provider]  carvedilol (COREG) 25 MG tablet Take by mouth 2 (two) times daily with a meal.  07/15/16  Yes [provider]  conjugated estrogens (PREMARIN) vaginal cream Place 1 Applicatorful vaginally daily. Apply 0.5mg  (pea-sized amount)  just inside the vaginal introitus with a finger-tip every night for two weeks and then Monday, Wednesday and Friday nights. 06/08/17  Yes McGowan, Carollee Herter A, PA-C  ferrous sulfate 325 (65 FE) MG tablet Take by mouth. Takes one pill every 2-3 days to minimize constipation   Yes [provider]  fesoterodine (TOVIAZ) 8  MG TB24 tablet Take 1 tablet (8 mg total) by mouth daily. 01/05/18  Yes McGowan, Carollee HerterShannon A, PA-C  Multiple Vitamin (MULTI-VITAMINS) TABS Take by mouth.   Yes [provider]  travoprost, benzalkonium, (TRAVATAN) 0.004 % ophthalmic solution Place 1 drop into both eyes at bedtime.    Yes [provider]  aspirin EC 325 MG tablet Take by mouth.    [provider]    Review of Systems  Constitutional: Positive for fatigue. Negative for appetite change.  HENT: Negative for congestion, postnasal drip and sore throat.    Eyes: Negative.   Respiratory: Negative for chest tightness and shortness of breath.   Cardiovascular: Positive for leg swelling. Negative for chest pain and palpitations.  Gastrointestinal: Negative for abdominal distention and abdominal pain.  Endocrine: Negative.   Genitourinary: Negative.   Musculoskeletal: Positive for arthralgias (feet hurt). Negative for back pain and neck pain.  Skin: Negative.   Allergic/Immunologic: Negative.   Neurological: Positive for light-headedness (when changing positions quickly) and numbness (tingling in feet due to neuropathy). Negative for dizziness.  Hematological: Negative for adenopathy. Does not bruise/bleed easily.  Psychiatric/Behavioral: Negative for dysphoric mood and sleep disturbance. The patient is not nervous/anxious.    Vitals:   03/08/18 1030  BP: 132/80  Pulse: 73  Resp: 18  SpO2: 100%  Weight: 215 lb 8 oz (97.8 kg)  Height: 5\' 4"  (1.626 m)   Wt Readings from Last 3 Encounters:  03/08/18 215 lb 8 oz (97.8 kg)  02/16/18 209 lb (94.8 kg)  01/23/18 204 lb (92.5 kg)   Lab Results  Component Value Date   CREATININE 1.33 (H) 02/16/2018   CREATININE 1.46 (H) 01/23/2018   Physical Exam  Constitutional: She is oriented to person, place, and time. She appears well-developed and well-nourished.  HENT:  Head: Normocephalic and atraumatic.  Neck: Normal range of motion. Neck supple. No JVD present.  Cardiovascular: Normal rate and regular rhythm.  Pulmonary/Chest: Effort normal. She has no wheezes. She has no rales.  Abdominal: Soft. She exhibits no distension. There is no tenderness.  Musculoskeletal: She exhibits edema (1+ pitting edema in lower legs). She exhibits no tenderness.  Neurological: She is alert and oriented to person, place, and time.  Skin: Skin is warm and dry.  Psychiatric: She has a normal mood and affect. Her behavior is normal. Thought content normal.  Nursing note and vitals reviewed.  Assessment &  Plan:  1: Chronic heart failure with reduced ejection fraction- - NYHA class III - mildly fluid overloaded today - add furosemide 20mg  daily; most recent potassium level looked good so will not add potassium today - not weighing daily. Instructed to weigh daily ever morning after using the bathroom, write the weight down and call for an overnight weight gain of >2 pounds or a weekly weight gain of >5 pounds - weight up 6 pounds since 02/16/18 - adds salt when she cooks as well as after. Discussed the importance of closely following a 2000mg  sodium diet and reviewed how to read food labels. Encouraged her to use Mrs Sharilyn SitesDash, pepper, garlic, onion in place of salt and to give her taste buds time to adjust. A low sodium cookbook was given to her as well as written dietary information.  - drinks ~32 ounces of water daily along with a a soda or tea - saw cardiology (Fath) 12/04/17 & returns ~ June 2019 - PharmD reconciled medications with the patient - will plan on getting a BMP at her next  visit since adding lasix today if labs aren't drawn elsewhere - elevate her legs when sitting for long periods of time - get compression socks and put them on every morning with removal at bedtime - last echo done in 2016; discussed doing another one after fluid is removed. If EF remains low, could switch her candesartan to entresto  2: HTN- - BP looks good today - BMP from 02/16/18 reviewed and showed sodium 141, potassium 4.9 and GFR 45   3: Diabetes- - nonfasting glucose in clinic was 93 - saw PCP (Thies) 01/19/18 - A1c on 01/19/18 was 5.9%  4: Obstructive sleep apnea- - has CPAP equipment under her bed - encouraged her to clean the equipment and try wearing it some - last sleep study was ~15 years ago - patient endorses getting very tired during the day - will repeat the sleep study to get updated pressures; discussed the potential health benefits of wearing CPAP correctly  Patient did not bring her  medications nor a list. Each medication was verbally reviewed with the patient and she was encouraged to bring the bottles to every visit to confirm accuracy of list.  Return in 2 weeks or sooner for any questions/problems before then.

## 2018-03-10 NOTE — Progress Notes (Signed)
03/11/2018 10:55 AM   Shelby [name] 01/25/1941 [id number]  Referring provider: Mickey Farber, MD [address] [facility] [place], [place] [address]  Chief Complaint  Patient presents with  . Follow-up    RUS results    HPI: Patient is a 77 year old African American female with a history of a left ureteral stone, mixed urinary incontinence and vaginal atrophy who follow ups today to review her RUS results.  She was seen at [facility] ED on January 23, 2018 after she experienced some lower abdominal pain and gross hematuria.  Her WBC count was 7.8.  Her UA was positive for too numerous to count RBCs and 6-30 WBCs.  Her creatinine was 1.46.  CT Renal stone study performed on 01/23/2018 noted a 3 mm calculus in the distal left ureter with resultant mild left hydroureteronephrosis.  Additional punctate nonobstructive bilateral nephrolithiasis.  Cholelithiasis.  6 mm pulmonary nodule in the left lower lobe. Non-contrast chest CT at 6-12 months is recommended. If the nodule is stable at time of repeat CT, then future CT at 18-24 months (from today's scan) is considered optional for low-risk patients, but is recommended for high-risk patients. This recommendation follows the consensus statement: Guidelines for Management of Incidental Pulmonary Nodules Detected on CT Images: From the Fleischner Society 2017; Radiology 2017; 284:228-243.  Aortic atherosclerosis (ICD10-I70.0).  RUS on 02/25/2018 demonstrates an interval resolution of left hydronephrosis.  Bilateral small renal cysts.  Today, she is experiencing frequency, nocturia and incontinence.  Patient denies any gross hematuria, dysuria or suprapubic/flank pain.  Patient denies any fevers, chills, nausea or vomiting.   Patient denies any gross hematuria, dysuria or suprapubic/flank pain.  Patient denies any fevers, chills, nausea or vomiting.   PMH: Past Medical History:  Diagnosis Date  . Anemia   . Cardiomyopathy  (HCC)    Ejection Fraction 30-35% per ECHO 2016  . CHF (congestive heart failure) (HCC)   . Diabetes (HCC)   . Dysrhythmia   . Hyperlipidemia   . Hypertension   . Peripheral neuropathy   . Sleep apnea     Surgical History: Past Surgical History:  Procedure Laterality Date  . ABDOMINAL HYSTERECTOMY    . COLONOSCOPY    . COLONOSCOPY WITH PROPOFOL N/A 12/29/2017   Procedure: COLONOSCOPY WITH PROPOFOL;  Surgeon: Christena Deem, MD;  Location: [facility] ENDOSCOPY;  Service: Endoscopy;  Laterality: N/A;  . ESOPHAGOGASTRODUODENOSCOPY (EGD) WITH PROPOFOL N/A 12/29/2017   Procedure: ESOPHAGOGASTRODUODENOSCOPY (EGD) WITH PROPOFOL;  Surgeon: Christena Deem, MD;  Location: [facility] ENDOSCOPY;  Service: Endoscopy;  Laterality: N/A;    Home Medications:  Allergies as of 03/11/2018      Reactions   Mirabegron Other (See Comments)   Elevated BP      Medication List        Accurate as of 03/11/18 10:55 AM. Always use your most recent med list.          amLODipine 5 MG tablet Commonly known as:  NORVASC Take by mouth daily.   aspirin EC 325 MG tablet Take by mouth.   aspirin EC 81 MG tablet Take 81 mg by mouth daily.   atorvastatin 80 MG tablet Commonly known as:  LIPITOR Take by mouth.   brimonidine 0.2 % ophthalmic solution Commonly known as:  ALPHAGAN Place 1 drop into both eyes 3 (three) times daily. Takes BID   candesartan 32 MG tablet Commonly known as:  ATACAND Take by mouth daily.   carvedilol 25 MG tablet  Commonly known as:  COREG Take by mouth 2 (two) times daily with a meal.   conjugated estrogens vaginal cream Commonly known as:  PREMARIN Place 1 Applicatorful vaginally daily. Apply 0.5mg  (pea-sized amount)  just inside the vaginal introitus with a finger-tip every night for two weeks and then Monday, Wednesday and Friday nights.   ferrous sulfate 325 (65 FE) MG tablet Take by mouth. Takes one pill every 2-3 days to minimize constipation   fesoterodine 8 MG  Tb24 tablet Commonly known as:  TOVIAZ Take 1 tablet (8 mg total) by mouth daily.   furosemide 20 MG tablet Commonly known as:  LASIX Take 1 tablet (20 mg total) by mouth daily.   MULTI-VITAMINS Tabs Take by mouth.   travoprost (benzalkonium) 0.004 % ophthalmic solution Commonly known as:  TRAVATAN Place 1 drop into both eyes at bedtime.       Allergies:  Allergies  Allergen Reactions  . Mirabegron Other (See Comments)    Elevated BP    Family History: Family History  Problem Relation Age of Onset  . Diabetes Mother   . Heart disease Mother   . Bladder Cancer Neg Hx   . Kidney cancer Neg Hx     Social History:  reports that she has never smoked. She has never used smokeless tobacco. She reports that she does not drink alcohol or use drugs.  ROS: UROLOGY Frequent Urination?: Yes Hard to postpone urination?: No Burning/pain with urination?: No Get up at night to urinate?: Yes Leakage of urine?: Yes Urine stream starts and stops?: No Trouble starting stream?: No Do you have to strain to urinate?: No Blood in urine?: No Urinary tract infection?: No Sexually transmitted disease?: No Injury to kidneys or bladder?: No Painful intercourse?: No Weak stream?: No Currently pregnant?: No Vaginal bleeding?: No Last menstrual period?: n  Gastrointestinal Nausea?: No Vomiting?: No Indigestion/heartburn?: No Diarrhea?: No Constipation?: No  Constitutional Fever: No Night sweats?: Yes Weight loss?: No Fatigue?: No  Skin Skin rash/lesions?: No Itching?: No  Eyes Blurred vision?: No Double vision?: No  Ears/Nose/Throat Sore throat?: No Sinus problems?: Yes  Hematologic/Lymphatic Swollen glands?: No Easy bruising?: No  Cardiovascular Leg swelling?: Yes Chest pain?: No  Respiratory Cough?: No Shortness of breath?: No  Endocrine Excessive thirst?: No  Musculoskeletal Back pain?: Yes Joint pain?: No  Neurological Headaches?:  No Dizziness?: No  Psychologic Depression?: No Anxiety?: No  Physical Exam: BP (!) 92/53   Pulse 65   Ht 5\' 4"  (1.626 m)   Wt 207 lb 1.6 oz (93.9 kg)   BMI 35.55 kg/m   Constitutional: Well nourished. Alert and oriented, No acute distress. HEENT: [place] AT, moist mucus membranes. Trachea midline, no masses. Cardiovascular: No clubbing, cyanosis, or edema. Respiratory: Normal respiratory effort, no increased work of breathing. GI: Abdomen is soft, non tender, non distended, no abdominal masses. Liver and spleen not palpable.  No hernias appreciated.  Stool sample for occult testing is not indicated.   GU: No CVA tenderness.  No bladder fullness or masses.   Skin: No rashes, bruises or suspicious lesions. Lymph: No cervical or inguinal adenopathy. Neurologic: Grossly intact, no focal deficits, moving all 4 extremities. Psychiatric: Normal mood and affect.  Pertinent Imaging: CLINICAL DATA:  Hydronephrosis with urinary obstruction. History of ureteral calculus.  EXAM: RENAL / URINARY TRACT ULTRASOUND COMPLETE  COMPARISON:  CT abdomen 01/23/2018  FINDINGS: Right Kidney:  Length: 9.2 cm. Increased renal cortical echogenicity. No hydronephrosis visualized. Two anechoic right upper pole renal masses  measuring 6 x 6 x 5 mm and 6 x 7 x 7 mm respectively consistent with cysts.  Left Kidney:  Length: 9.1 cm. Increased renal cortical echogenicity. Echogenicity within normal limits. No hydronephrosis visualized. 1.4 x 1.7 x 1.4 cm anechoic left upper pole renal mass most consistent with a cyst. 9 x 9 x 9 mm anechoic left lower pole renal mass most consistent with a cyst.  Bladder:  Appears normal for degree of bladder distention. Prevoid bladder volume 164 mL. Bilateral ureteral jets are noted.  IMPRESSION: 1. Interval resolution of left hydronephrosis. 2. Bilateral small renal cysts.   Electronically Signed   By: Elige KoHetal  Patel   On: 02/25/2018 15:32  I  have independently reviewed the films  Assessment & Plan:    1. Left ureteral stone  - most likely passed as patient is symptom free  2. Left hydronephrosis  - the hydronephrosis has resolved.    3. Gross hematuria  - likely due to passage of stone  - patient does not report further gross hematuria and last UA was negative  4. Pulmonary nodule CT of chest in 6 months (06/2018)  5. Hypotension Patient was found to be hypotensive at the start of her visit - she was given some orange juice and BP increased to normal value - she denies chest pain, dizziness or fatigue Advised to contact PCP  Return in about 3 months (around 06/10/2018) for CT chest report .  These notes generated with voice recognition software. I apologize for typographical errors.  Michiel CowboySHANNON Monesha Monreal, PA-C  [facility] [address], [address] [place], [place] [address] [phone]

## 2018-03-11 ENCOUNTER — Ambulatory Visit (INDEPENDENT_AMBULATORY_CARE_PROVIDER_SITE_OTHER): Payer: Medicare Other | Admitting: Urology

## 2018-03-11 ENCOUNTER — Telehealth: Payer: Self-pay

## 2018-03-11 ENCOUNTER — Encounter: Payer: Self-pay | Admitting: Urology

## 2018-03-11 VITALS — BP 92/53 | HR 65 | Ht 64.0 in | Wt 207.1 lb

## 2018-03-11 DIAGNOSIS — N1330 Unspecified hydronephrosis: Secondary | ICD-10-CM | POA: Diagnosis not present

## 2018-03-11 DIAGNOSIS — N201 Calculus of ureter: Secondary | ICD-10-CM | POA: Diagnosis not present

## 2018-03-11 DIAGNOSIS — R911 Solitary pulmonary nodule: Secondary | ICD-10-CM | POA: Diagnosis not present

## 2018-03-11 DIAGNOSIS — R310 Gross hematuria: Secondary | ICD-10-CM

## 2018-03-11 DIAGNOSIS — I959 Hypotension, unspecified: Secondary | ICD-10-CM

## 2018-03-11 MED ORDER — FESOTERODINE FUMARATE ER 8 MG PO TB24: 8.0000 mg | ORAL_TABLET | Freq: Every day | ORAL | 4 refills | Status: DC

## 2018-03-11 MED ORDER — ESTROGENS, CONJUGATED 0.625 MG/GM VA CREA: 1.0000 | TOPICAL_CREAM | Freq: Every day | VAGINAL | 12 refills | Status: DC

## 2018-03-11 NOTE — Telephone Encounter (Signed)
[name] contacted the office concerned about her blood pressure. She states that Monday 4/1 she visited the clinic and changes were made to her fluid pill. She has been checking her blood pressure and it has been low all day today 83/49 and she was unsure if she should continue to take the diuretic. She also complains of cramping in her legs.    I advised that she hold her morning diuretic pill as she has already taken her dose for today, and wait to take it until she [facility] who will advise what she should do.  Patient verbalized understanding and will await phone call before taking morning dose.

## 2018-03-12 NOTE — Telephone Encounter (Signed)
Spoke with patient regarding her blood pressure.   At urology office yesterday, her BP was 92/53. When she checked it at home yesterday, it was 83/49 and yesterday evening it was 99/57. She says that her weight has declined at home ~ 3 pounds and she no longer has any swelling her legs. She says that she's not adding any salt to her food.   She does feel a little weak and says that she's noticed intermittent cramping. Advised patient to stop taking her furosemide and only take it as needed for weight gain, edema or shortness of breath.   On 4/1: she weighed 215.8 lbs in the office On 4/4: she weighed 207.1 at urology office  Have moved patient's follow-up appointment up a week and will see her next week and draw a BMP at that time.

## 2018-03-17 NOTE — Progress Notes (Signed)
Patient ID: Shelby [name], female    DOB: May 10, 1941, 77 y.o.   MRN: [id number]  HPI  Shelby [name] is a 77 y/o female with a history of DM, hyperlipidemia, HTN, anemia, obstructive sleep apnea, neuropathy and chronic heart failure.   Echo report from 02/13/15 reviewed and showed an EF of 35% along with moderate MR and mild PHTN. Function study on 03/16/15 showed an EF of 52%.   Was in the ED 01/23/18 due to renal colic where she was treated and released.   She presents today for a follow-up visit with a chief complaint of moderate fatigue upon minimal exertion. She says this has been chronic in nature having been present for several months. She has associated light-headedness and numbness in her feet. She denies any difficulty sleeping, abdominal distention, palpitations, edema, chest pain, shortness of breath or weight gain. She says that she hasn't had to take any furosemide since it was stopped.   Past Medical History:  Diagnosis Date  . Anemia   . Cardiomyopathy (HCC)    Ejection Fraction 30-35% per ECHO 2016  . CHF (congestive heart failure) (HCC)   . Diabetes (HCC)   . Dysrhythmia   . Hyperlipidemia   . Hypertension   . Peripheral neuropathy   . Sleep apnea    Past Surgical History:  Procedure Laterality Date  . ABDOMINAL HYSTERECTOMY    . COLONOSCOPY    . COLONOSCOPY WITH PROPOFOL N/A 12/29/2017   Procedure: COLONOSCOPY WITH PROPOFOL;  Surgeon: Christena Deem, MD;  Location: [facility] ENDOSCOPY;  Service: Endoscopy;  Laterality: N/A;  . ESOPHAGOGASTRODUODENOSCOPY (EGD) WITH PROPOFOL N/A 12/29/2017   Procedure: ESOPHAGOGASTRODUODENOSCOPY (EGD) WITH PROPOFOL;  Surgeon: Christena Deem, MD;  Location: [facility] ENDOSCOPY;  Service: Endoscopy;  Laterality: N/A;   Family History  Problem Relation Age of Onset  . Diabetes Mother   . Heart disease Mother   . Bladder Cancer Neg Hx   . Kidney cancer Neg Hx    Social History   Tobacco Use  . Smoking status: Never Smoker  .  Smokeless tobacco: Never Used  Substance Use Topics  . Alcohol use: No   Allergies  Allergen Reactions  . Mirabegron Other (See Comments)    Elevated BP   Prior to Admission medications   Medication Sig Start Date End Date Taking? Authorizing Provider  amLODipine (NORVASC) 5 MG tablet Take by mouth daily.  12/11/15  Yes [provider]  aspirin EC 81 MG tablet Take 81 mg by mouth daily.   Yes [provider]  atorvastatin (LIPITOR) 80 MG tablet Take by mouth. 12/11/15  Yes [provider]  brimonidine (ALPHAGAN) 0.2 % ophthalmic solution Place 1 drop into both eyes 3 (three) times daily. Takes BID   Yes [provider]  candesartan (ATACAND) 32 MG tablet Take by mouth daily.  07/15/16  Yes [provider]  carvedilol (COREG) 25 MG tablet Take by mouth 2 (two) times daily with a meal.  07/15/16  Yes [provider]  conjugated estrogens (PREMARIN) vaginal cream Place 1 Applicatorful vaginally daily. Apply 0.5mg  (pea-sized amount)  just inside the vaginal introitus with a finger-tip every night for two weeks and then Monday, Wednesday and Friday nights. 03/11/18  Yes McGowan, Carollee Herter A, PA-C  donepezil (ARICEPT) 10 MG tablet Take 10 mg by mouth at bedtime.   Yes [provider]  ferrous sulfate 325 (65 FE) MG tablet Take by mouth. Takes one pill every 2-3 days to minimize constipation  Yes [provider]  fesoterodine (TOVIAZ) 8 MG TB24 tablet Take 1 tablet (8 mg total) by mouth daily. 03/11/18  Yes McGowan, Carollee HerterShannon A, PA-C  Multiple Vitamin (MULTI-VITAMINS) TABS Take by mouth.   Yes [provider]  pantoprazole (PROTONIX) 40 MG tablet Take 40 mg by mouth daily.   Yes [provider]  travoprost, benzalkonium, (TRAVATAN) 0.004 % ophthalmic solution Place 1 drop into both eyes at bedtime.    Yes [provider]  furosemide (LASIX) 20 MG tablet Take 1 tablet (20 mg total) by mouth daily. Patient not taking:  Reported on 03/18/2018 03/08/18 06/06/18  Delma FreezeHackney, Tameka Hoiland A, FNP   Review of Systems  Constitutional: Positive for fatigue. Negative for appetite change.  HENT: Negative for congestion, postnasal drip and sore throat.   Eyes: Negative.   Respiratory: Negative for chest tightness and shortness of breath.   Cardiovascular: Negative for chest pain, palpitations and leg swelling.  Gastrointestinal: Negative for abdominal distention and abdominal pain.  Endocrine: Negative.   Genitourinary: Negative.   Musculoskeletal: Positive for arthralgias (feet hurt). Negative for back pain and neck pain.  Skin: Negative.   Allergic/Immunologic: Negative.   Neurological: Positive for light-headedness (when changing positions quickly) and numbness (tingling in feet due to neuropathy). Negative for dizziness.  Hematological: Negative for adenopathy. Does not bruise/bleed easily.  Psychiatric/Behavioral: Negative for dysphoric mood and sleep disturbance. The patient is not nervous/anxious.    Vitals:   03/18/18 1015  BP: (!) 115/59  Pulse: 62  Resp: 18  SpO2: 99%  Weight: 209 lb 4 oz (94.9 kg)  Height: 5\' 4"  (1.626 m)   Wt Readings from Last 3 Encounters:  03/18/18 209 lb 4 oz (94.9 kg)  03/11/18 207 lb 1.6 oz (93.9 kg)  03/08/18 215 lb 8 oz (97.8 kg)   Lab Results  Component Value Date   CREATININE 1.33 (H) 02/16/2018   CREATININE 1.46 (H) 01/23/2018    Physical Exam  Constitutional: She is oriented to person, place, and time. She appears well-developed and well-nourished.  HENT:  Head: Normocephalic and atraumatic.  Neck: Normal range of motion. Neck supple. No JVD present.  Cardiovascular: Normal rate and regular rhythm.  Pulmonary/Chest: Effort normal. She has no wheezes. She has no rales.  Abdominal: Soft. She exhibits no distension. There is no tenderness.  Musculoskeletal: She exhibits no edema or tenderness.  Neurological: She is alert and oriented to person, place, and time.  Skin:  Skin is warm and dry.  Psychiatric: She has a normal mood and affect. Her behavior is normal. Thought content normal.  Nursing note and vitals reviewed.  Assessment & Plan:  1: Chronic heart failure with reduced ejection fraction- - NYHA class III - euvolemic today - weighing daily. Reminded to call for an overnight weight gain of >2 pounds or a weekly weight gain of >5 pounds - weight down 6 pounds since she was last here on 03/08/18 - trying to not add salt  - drinks ~32 ounces of water daily along with a a soda or tea - saw cardiology (Fath) 12/04/17 & returns ~ June 2019 - no further edema in her legs - will get a BMP today as furosemide was added at last visit but then had to be stopped due to hypotension/cramping - last echo done in 2016; discussed doing another one after fluid is removed. If EF remains low, could switch her candesartan to entresto - PharmD reconciled medications with the patient  2: HTN- - BP looks good  today - BMP from 02/16/18 reviewed and showed sodium 141, potassium 4.9 and GFR 45   3: Diabetes- - saw PCP (Thies) 01/19/18 - A1c on 01/19/18 was 5.9%  4: Obstructive sleep apnea- - last sleep study was ~15 years ago - currently has a sleep study scheduled but says she may reschedule it. Encouraged her to keep the appointment so that her CPAP equipment can get ordered with updated pressure  Patient did not bring her medications nor a list. Each medication was verbally reviewed with the patient and she was encouraged to bring the bottles to every visit to confirm accuracy of list.  Return in 2 months or sooner for any questions/problems before then.

## 2018-03-18 ENCOUNTER — Ambulatory Visit: Payer: Medicare Other | Attending: Family | Admitting: Family

## 2018-03-18 ENCOUNTER — Encounter: Payer: Self-pay | Admitting: Family

## 2018-03-18 VITALS — BP 115/59 | HR 62 | Resp 18 | Ht 64.0 in | Wt 209.2 lb

## 2018-03-18 DIAGNOSIS — I429 Cardiomyopathy, unspecified: Secondary | ICD-10-CM | POA: Insufficient documentation

## 2018-03-18 DIAGNOSIS — I110 Hypertensive heart disease with heart failure: Secondary | ICD-10-CM | POA: Insufficient documentation

## 2018-03-18 DIAGNOSIS — E1129 Type 2 diabetes mellitus with other diabetic kidney complication: Secondary | ICD-10-CM

## 2018-03-18 DIAGNOSIS — Z7982 Long term (current) use of aspirin: Secondary | ICD-10-CM | POA: Insufficient documentation

## 2018-03-18 DIAGNOSIS — D649 Anemia, unspecified: Secondary | ICD-10-CM | POA: Diagnosis not present

## 2018-03-18 DIAGNOSIS — G4733 Obstructive sleep apnea (adult) (pediatric): Secondary | ICD-10-CM | POA: Diagnosis not present

## 2018-03-18 DIAGNOSIS — E1140 Type 2 diabetes mellitus with diabetic neuropathy, unspecified: Secondary | ICD-10-CM | POA: Diagnosis not present

## 2018-03-18 DIAGNOSIS — I5022 Chronic systolic (congestive) heart failure: Secondary | ICD-10-CM | POA: Diagnosis not present

## 2018-03-18 DIAGNOSIS — E785 Hyperlipidemia, unspecified: Secondary | ICD-10-CM | POA: Insufficient documentation

## 2018-03-18 DIAGNOSIS — Z79899 Other long term (current) drug therapy: Secondary | ICD-10-CM | POA: Insufficient documentation

## 2018-03-18 DIAGNOSIS — I10 Essential (primary) hypertension: Secondary | ICD-10-CM

## 2018-03-18 DIAGNOSIS — I272 Pulmonary hypertension, unspecified: Secondary | ICD-10-CM | POA: Insufficient documentation

## 2018-03-18 LAB — BASIC METABOLIC PANEL
Anion gap: 5 (ref 5–15)
BUN: 44 mg/dL — ABNORMAL HIGH (ref 6–20)
CALCIUM: 10.3 mg/dL (ref 8.9–10.3)
CO2: 28 mmol/L (ref 22–32)
CREATININE: 1.65 mg/dL — AB (ref 0.44–1.00)
Chloride: 108 mmol/L (ref 101–111)
GFR calc non Af Amer: 30 mL/min — ABNORMAL LOW (ref >60)
GFR, EST AFRICAN AMERICAN: 34 mL/min — AB (ref >60)
Glucose, Bld: 71 mg/dL (ref 65–99)
Potassium: 4.7 mmol/L (ref 3.5–5.1)
SODIUM: 141 mmol/L (ref 135–145)

## 2018-03-18 NOTE — Patient Instructions (Signed)
Continue weighing daily and call for an overnight weight gain of > 2 pounds or a weekly weight gain of >5 pounds. 

## 2018-03-22 ENCOUNTER — Ambulatory Visit: Admitting: Family

## 2018-03-31 ENCOUNTER — Encounter: Payer: Self-pay | Admitting: *Deleted

## 2018-04-01 ENCOUNTER — Ambulatory Visit
Admission: RE | Admit: 2018-04-01 | Discharge: 2018-04-01 | Disposition: A | Discharge status: Home / Self Care | Payer: Medicare Other | Source: Ambulatory Visit | Attending: Gastroenterology | Admitting: Gastroenterology

## 2018-04-01 ENCOUNTER — Ambulatory Visit: Payer: Medicare Other | Admitting: Certified Registered Nurse Anesthetist

## 2018-04-01 ENCOUNTER — Encounter: Payer: Self-pay | Admitting: Certified Registered Nurse Anesthetist

## 2018-04-01 ENCOUNTER — Encounter
Admission: RE | Disposition: A | Discharge status: Home / Self Care | Payer: Self-pay | Source: Ambulatory Visit | Attending: Gastroenterology

## 2018-04-01 DIAGNOSIS — G4730 Sleep apnea, unspecified: Secondary | ICD-10-CM | POA: Diagnosis not present

## 2018-04-01 DIAGNOSIS — K2950 Unspecified chronic gastritis without bleeding: Secondary | ICD-10-CM | POA: Diagnosis not present

## 2018-04-01 DIAGNOSIS — I429 Cardiomyopathy, unspecified: Secondary | ICD-10-CM | POA: Insufficient documentation

## 2018-04-01 DIAGNOSIS — K317 Polyp of stomach and duodenum: Secondary | ICD-10-CM | POA: Insufficient documentation

## 2018-04-01 DIAGNOSIS — Z79890 Hormone replacement therapy (postmenopausal): Secondary | ICD-10-CM | POA: Insufficient documentation

## 2018-04-01 DIAGNOSIS — Q438 Other specified congenital malformations of intestine: Secondary | ICD-10-CM | POA: Diagnosis not present

## 2018-04-01 DIAGNOSIS — Z7982 Long term (current) use of aspirin: Secondary | ICD-10-CM | POA: Diagnosis not present

## 2018-04-01 DIAGNOSIS — E785 Hyperlipidemia, unspecified: Secondary | ICD-10-CM | POA: Diagnosis not present

## 2018-04-01 DIAGNOSIS — R195 Other fecal abnormalities: Secondary | ICD-10-CM | POA: Insufficient documentation

## 2018-04-01 DIAGNOSIS — E1142 Type 2 diabetes mellitus with diabetic polyneuropathy: Secondary | ICD-10-CM | POA: Insufficient documentation

## 2018-04-01 DIAGNOSIS — I110 Hypertensive heart disease with heart failure: Secondary | ICD-10-CM | POA: Insufficient documentation

## 2018-04-01 DIAGNOSIS — K6389 Other specified diseases of intestine: Secondary | ICD-10-CM | POA: Diagnosis not present

## 2018-04-01 DIAGNOSIS — K5730 Diverticulosis of large intestine without perforation or abscess without bleeding: Secondary | ICD-10-CM | POA: Diagnosis not present

## 2018-04-01 DIAGNOSIS — Z8719 Personal history of other diseases of the digestive system: Secondary | ICD-10-CM | POA: Diagnosis not present

## 2018-04-01 DIAGNOSIS — D509 Iron deficiency anemia, unspecified: Secondary | ICD-10-CM | POA: Diagnosis present

## 2018-04-01 DIAGNOSIS — I509 Heart failure, unspecified: Secondary | ICD-10-CM | POA: Diagnosis not present

## 2018-04-01 DIAGNOSIS — Z79899 Other long term (current) drug therapy: Secondary | ICD-10-CM | POA: Insufficient documentation

## 2018-04-01 DIAGNOSIS — K219 Gastro-esophageal reflux disease without esophagitis: Secondary | ICD-10-CM | POA: Insufficient documentation

## 2018-04-01 DIAGNOSIS — K228 Other specified diseases of esophagus: Secondary | ICD-10-CM | POA: Diagnosis not present

## 2018-04-01 HISTORY — PX: COLONOSCOPY: SHX5424

## 2018-04-01 HISTORY — PX: ESOPHAGOGASTRODUODENOSCOPY (EGD) WITH PROPOFOL: SHX5813

## 2018-04-01 LAB — GLUCOSE, CAPILLARY: GLUCOSE-CAPILLARY: 56 mg/dL — AB (ref 65–99)

## 2018-04-01 SURGERY — COLONOSCOPY
Anesthesia: General

## 2018-04-01 MED ORDER — BUTAMBEN-TETRACAINE-BENZOCAINE 2-2-14 % EX AERO
INHALATION_SPRAY | CUTANEOUS | Status: AC
  Filled 2018-04-01: qty 5

## 2018-04-01 MED ORDER — SODIUM CHLORIDE 0.9 % IV SOLN
INTRAVENOUS | Status: DC
  Administered 2018-04-01: 1000 mL via INTRAVENOUS

## 2018-04-01 MED ORDER — LIDOCAINE HCL (CARDIAC) PF 100 MG/5ML IV SOSY
PREFILLED_SYRINGE | INTRAVENOUS | Status: DC | PRN
  Administered 2018-04-01: 100 mg via INTRAVENOUS

## 2018-04-01 MED ORDER — PROPOFOL 500 MG/50ML IV EMUL
INTRAVENOUS | Status: AC
  Filled 2018-04-01: qty 50

## 2018-04-01 MED ORDER — PROPOFOL 10 MG/ML IV BOLUS
INTRAVENOUS | Status: AC
  Filled 2018-04-01: qty 20

## 2018-04-01 MED ORDER — LIDOCAINE HCL (PF) 2 % IJ SOLN
INTRAMUSCULAR | Status: AC
  Filled 2018-04-01: qty 10

## 2018-04-01 MED ORDER — KETAMINE HCL 10 MG/ML IJ SOLN
INTRAMUSCULAR | Status: DC | PRN
  Administered 2018-04-01: 10 mg via INTRAVENOUS

## 2018-04-01 MED ORDER — SODIUM CHLORIDE 0.9 % IJ SOLN
INTRAMUSCULAR | Status: AC
  Filled 2018-04-01: qty 10

## 2018-04-01 MED ORDER — KETAMINE HCL 50 MG/ML IJ SOLN
INTRAMUSCULAR | Status: AC
  Filled 2018-04-01: qty 10

## 2018-04-01 MED ORDER — PROPOFOL 10 MG/ML IV BOLUS
INTRAVENOUS | Status: DC | PRN
  Administered 2018-04-01: 20 mg via INTRAVENOUS
  Administered 2018-04-01: 50 mg via INTRAVENOUS

## 2018-04-01 MED ORDER — GLYCOPYRROLATE 0.2 MG/ML IJ SOLN
INTRAMUSCULAR | Status: DC | PRN
  Administered 2018-04-01: 0.2 mg via INTRAVENOUS

## 2018-04-01 MED ORDER — PROPOFOL 500 MG/50ML IV EMUL
INTRAVENOUS | Status: DC | PRN
  Administered 2018-04-01: 200 ug/kg/min via INTRAVENOUS

## 2018-04-01 NOTE — Anesthesia Preprocedure Evaluation (Signed)
Anesthesia Evaluation  Patient identified by MRN, date of birth, ID band Patient awake    Reviewed: Allergy & Precautions, NPO status , Patient's Chart, lab work & pertinent test results  History of Anesthesia Complications Negative for: history of anesthetic complications  Airway Mallampati: III       Dental  (+) Partial Upper   Pulmonary sleep apnea (not using CPAP) , neg COPD,           Cardiovascular hypertension, Pt. on medications and Pt. on home beta blockers +CHF  (-) Past MI and (-) Orthopnea (-) dysrhythmias (-) Valvular Problems/Murmurs     Neuro/Psych neg Seizures    GI/Hepatic Neg liver ROS, GERD  Medicated and Controlled,  Endo/Other  diabetes, Type 2, Oral Hypoglycemic Agents  Renal/GU Renal InsufficiencyRenal disease     Musculoskeletal   Abdominal   Peds  Hematology  (+) anemia ,   Anesthesia Other Findings   Reproductive/Obstetrics                             Anesthesia Physical Anesthesia Plan  ASA: III  Anesthesia Plan:    Post-op Pain Management:    Induction: Intravenous  PONV Risk Score and Plan: 2  Airway Management Planned: Nasal Cannula  Additional Equipment:   Intra-op Plan:   Post-operative Plan:   Informed Consent: I have reviewed the patients History and Physical, chart, labs and discussed the procedure including the risks, benefits and alternatives for the proposed anesthesia with the patient or authorized representative who has indicated his/her understanding and acceptance.     Plan Discussed with:   Anesthesia Plan Comments:         Anesthesia Quick Evaluation

## 2018-04-01 NOTE — Op Note (Addendum)
[facility] Gastroenterology Patient Name: Shelby [name] Procedure Date: 04/01/2018 7:28 AM MRN: [id number] Account #: [id number] Date of Birth: Jan 25, 1941 Admit Type: Outpatient Age: 77 Room: [facility] ENDO ROOM 1 Gender: Female Note Status: Finalized Procedure:            Upper GI endoscopy Indications:          Unexplained iron deficiency anemia, Heme positive                        stool, Follow-up of chronic gastric ulcer Providers:            Christena Deem, MD Referring MD:         Neomia Dear. Harrington Challenger, MD (Referring MD) Medicines:            Monitored Anesthesia Care Complications:        No immediate complications. Procedure:            Pre-Anesthesia Assessment:                       - ASA Grade Assessment: III - A patient with severe                        systemic disease.                       After obtaining informed consent, the endoscope was                        passed under direct vision. Throughout the procedure,                        the patient's blood pressure, pulse, and oxygen                        saturations were monitored continuously. The Endoscope                        was introduced through the mouth, and advanced to the                        third part of duodenum. The upper GI endoscopy was                        accomplished without difficulty. The patient tolerated                        the procedure well. Findings:      The examined esophagus was normal.      Two 3 to 10 mm pedunculated and sessile polyps with no bleeding and       stigmata of recent bleeding were found in the gastric antrum. [place] of       these seemed associated with a deeper component. Biopsies were taken of       the posterior antral polyp and smaller polyp on the incisura, placed       into separate jars. Hemostasis is noted to be good.      Biopsies were taken with a cold forceps in the gastric body and in the       gastric antrum for Helicobacter  pylori testing.      The cardia and  gastric fundus were normal on retroflexion.      The examined duodenum was normal.      A single 2 mm polyp/papillary lesion with no bleeding was found 17 cm       from the incisors. The polyp was removed with a cold biopsy forceps.       Resection and retrieval were complete. Impression:           - Normal esophagus.                       - Two gastric polyps.                       - Normal examined duodenum.                       - Biopsies were taken with a cold forceps for                        Helicobacter pylori testing. Recommendation:       - Await pathology results.                       - Perform an upper endoscopic ultrasound (UEUS) at                        appointment to be scheduled. Procedure Code(s):    --- Professional ---                       [phone], Esophagogastroduodenoscopy, flexible, transoral;                        with biopsy, single or multiple Diagnosis Code(s):    --- Professional ---                       K31.7, Polyp of stomach and duodenum                       D50.9, Iron deficiency anemia, unspecified                       R19.5, Other fecal abnormalities                       K25.7, Chronic gastric ulcer without hemorrhage or                        perforation CPT copyright 2017 American Medical Association. All rights reserved. The codes documented in this report are preliminary and upon coder review may  be revised to meet current compliance requirements. Christena DeemMartin U Skulskie, MD 04/01/2018 7:58:30 AM This report has been signed electronically. Number of Addenda: 0 Note Initiated On: 04/01/2018 7:28 AM      [facility]

## 2018-04-01 NOTE — Op Note (Signed)
[facility]helby [name] Gastroenterology Patient Name: Rachael FeeCatherine Norbeck Procedure Date: 04/01/2018 7:27 AM MRN: [id number] Account #: [id number] Date of Birth: 27-Sep-1941 Admit Type: Outpatient Age: 77 Room: [facility] ENDO ROOM 1 Gender: Female Note Status: Finalized Procedure:            Colonoscopy Indications:          Heme positive stool, Iron deficiency anemia Providers:            Christena DeemMartin U. Celia Gibbons, MD Referring MD:         Neomia Dearavid N. Harrington Challengerhies, MD (Referring MD) Medicines:            Monitored Anesthesia Care Complications:        No immediate complications. Procedure:            Pre-Anesthesia Assessment:                       - ASA Grade Assessment: III - A patient with severe                        systemic disease.                       After obtaining informed consent, the colonoscope was                        passed under direct vision. Throughout the procedure,                        the patient's blood pressure, pulse, and oxygen                        saturations were monitored continuously. The                        Colonoscope was introduced through the anus with the                        intention of advancing to the cecum. The scope was                        advanced to the descending colon before the procedure                        was aborted. Medications were given. The colonoscopy                        was extremely difficult due to a redundant colon and                        significant looping. The patient tolerated the                        procedure well. The quality of the bowel preparation                        was good. Findings:      Multiple medium-mouthed diverticula were found in the sigmoid colon and       descending colon.      A diffuse area of moderate melanosis was found in the entire colon.  The digital rectal exam was normal. Impression:           - Diverticulosis in the sigmoid colon and in the   descending colon.                       - Melanosis in the colon.                       - No specimens collected. Recommendation:       - Discharge patient to home.                       - Full liquid diet for 1 day, then advance as tolerated                        to soft diet for 2 days. Procedure Code(s):    --- Professional ---                       [phone], 53, Colonoscopy, flexible; diagnostic, including                        collection of specimen(s) by brushing or washing, when                        performed (separate procedure) Diagnosis Code(s):    --- Professional ---                       K63.89, Other specified diseases of intestine                       R19.5, Other fecal abnormalities                       D50.9, Iron deficiency anemia, unspecified                       K57.30, Diverticulosis of large intestine without                        perforation or abscess without bleeding CPT copyright 2017 American Medical Association. All rights reserved. The codes documented in this report are preliminary and upon coder review may  be revised to meet current compliance requirements. Christena Deem, MD 04/01/2018 8:24:18 AM This report has been signed electronically. Number of Addenda: 0 Note Initiated On: 04/01/2018 7:27 AM Total Procedure Duration: 0 hours 17 minutes 20 seconds       [facility]

## 2018-04-01 NOTE — Transfer of Care (Signed)
Immediate Anesthesia Transfer of Care Note  Patient: Shelby PilotCatherine Brown Storrs  Procedure(s) Performed: COLONOSCOPY (N/A ) ESOPHAGOGASTRODUODENOSCOPY (EGD) WITH PROPOFOL (N/A )  Patient Location: PACU and Endoscopy Unit  Anesthesia Type:General  Level of Consciousness: awake  Airway & Oxygen Therapy: Patient Spontanous Breathing  Post-op Assessment: Report given to RN  Post vital signs: stable  Last Vitals:  Vitals Value Taken Time  BP    Temp    Pulse 80 04/01/2018  8:25 AM  Resp 19 04/01/2018  8:25 AM  SpO2 99 % 04/01/2018  8:25 AM  Vitals shown include unvalidated device data.  Last Pain:  Vitals:   04/01/18 0659  TempSrc: Tympanic         Complications: No apparent anesthesia complications

## 2018-04-01 NOTE — OR Nursing (Signed)
Tortous colon unable reach cecum, to 50 cm.

## 2018-04-01 NOTE — Anesthesia Postprocedure Evaluation (Signed)
Anesthesia Post Note  Patient: Shelby [name]  Procedure(s) Performed: COLONOSCOPY (N/A ) ESOPHAGOGASTRODUODENOSCOPY (EGD) WITH PROPOFOL (N/A )  Patient location during evaluation: Endoscopy Anesthesia Type: General Level of consciousness: awake and alert Pain management: pain level controlled Vital Signs Assessment: post-procedure vital signs reviewed and stable Respiratory status: spontaneous breathing and respiratory function stable Cardiovascular status: stable Anesthetic complications: no     Last Vitals:  Vitals:   04/01/18 0825 04/01/18 0835  BP: 109/70 124/81  Pulse:    Resp:    Temp: (!) 36.1 C   SpO2:      Last Pain:  Vitals:   04/01/18 0825  TempSrc: Tympanic                 [name]

## 2018-04-01 NOTE — Anesthesia Post-op Follow-up Note (Signed)
Anesthesia QCDR form completed.        

## 2018-04-01 NOTE — H&P (Signed)
Outpatient short stay form Pre-procedure 04/01/2018 7:18 AM Shelby DeemMartin U Zendayah Hardgrave MD  Primary Physician: Dr. Hal Moralesavid Theis  Reason for visit: EGD and colonoscopy  History of present illness: Patient is a 77 year old female presenting today for further evaluation regards her Hemoccult positive stool and anemia.  Scheduled for an EGD and colonoscopy on 12/29/2017.  At that time she was found to have a gastric ulcer.  However her prep was poor and I was unable to do the colonoscopy.  In the interim she has been treated for the ulcer and is presenting today for an upper and lower scope to recheck the ulcer as well as the colonoscopy.  She does take 81 mg aspirin but has held that.  She takes no other aspirin products or blood thinning agent.  Most history of thrombocytopenia over her last platelet count showed her to be 192 on 01/23/2018.  Platelet counts I have found and checked have been normal.  Patient cannot recollect any history of this issue.    Current Facility-Administered Medications:  .  butamben-tetracaine-benzocaine (CETACAINE) 01-09-13 % spray, , , ,  .  0.9 %  sodium chloride infusion, , Intravenous, Continuous, Shelby DeemSkulskie, Jaaron Oleson U, MD, Last Rate: 20 mL/hr at 04/01/18 0713, 1,000 mL at 04/01/18 [id number]  Medications Prior to Admission  Medication Sig Dispense Refill Last Dose  . amLODipine (NORVASC) 5 MG tablet Take by mouth daily.    03/31/2018 at Unknown time  . aspirin EC 81 MG tablet Take 81 mg by mouth daily.   Past Week at Unknown time  . atorvastatin (LIPITOR) 80 MG tablet Take by mouth.   03/31/2018 at Unknown time  . brimonidine (ALPHAGAN) 0.2 % ophthalmic solution Place 1 drop into both eyes 3 (three) times daily. Takes BID   03/31/2018 at Unknown time  . candesartan (ATACAND) 32 MG tablet Take by mouth daily.    03/31/2018 at Unknown time  . carvedilol (COREG) 25 MG tablet Take by mouth 2 (two) times daily with a meal.    03/31/2018 at Unknown time  . conjugated estrogens (PREMARIN) vaginal  cream Place 1 Applicatorful vaginally daily. Apply 0.5mg  (pea-sized amount)  just inside the vaginal introitus with a finger-tip every night for two weeks and then Monday, Wednesday and Friday nights. 30 g 12 03/31/2018 at Unknown time  . donepezil (ARICEPT) 10 MG tablet Take 10 mg by mouth at bedtime.   Past Week at Unknown time  . ferrous sulfate 325 (65 FE) MG tablet Take by mouth. Takes one pill every 2-3 days to minimize constipation   Past Week at Unknown time  . fesoterodine (TOVIAZ) 8 MG TB24 tablet Take 1 tablet (8 mg total) by mouth daily. 90 tablet 4 Past Week at Unknown time  . furosemide (LASIX) 20 MG tablet Take 1 tablet (20 mg total) by mouth daily. 90 tablet 3 Past Week at Unknown time  . Multiple Vitamin (MULTI-VITAMINS) TABS Take by mouth.   Past Week at Unknown time  . pantoprazole (PROTONIX) 40 MG tablet Take 40 mg by mouth daily.   Past Week at Unknown time  . travoprost, benzalkonium, (TRAVATAN) 0.004 % ophthalmic solution Place 1 drop into both eyes at bedtime.    Past Week at Unknown time     Allergies  Allergen Reactions  . Mirabegron Other (See Comments)    Elevated BP     Past Medical History:  Diagnosis Date  . Anemia   . Cardiomyopathy (HCC)    Ejection Fraction 30-35% per ECHO 2016  .  CHF (congestive heart failure) (HCC)   . Diabetes (HCC)   . Dysrhythmia   . Hyperlipidemia   . Hypertension   . Peripheral neuropathy   . Sleep apnea     Review of systems:      Physical Exam    Heart and lungs: Regular rate and rhythm without rub or gallop, lungs are bilaterally clear.    HEENT: Normocephalic atraumatic eyes are anicteric    Other:    Pertinant exam for procedure: Soft nontender nondistended bowel sounds positive normoactive    Planned proceedures: EGD, colonoscopy and indicated procedures. I have discussed the risks benefits and complications of procedures to include not limited to bleeding, infection, perforation and the risk of sedation and  the patient wishes to proceed.    Shelby Deem, MD Gastroenterology 04/01/2018  7:18 AM

## 2018-04-02 ENCOUNTER — Encounter: Payer: Self-pay | Admitting: Gastroenterology

## 2018-04-02 LAB — SURGICAL PATHOLOGY

## 2018-04-18 DIAGNOSIS — R911 Solitary pulmonary nodule: Secondary | ICD-10-CM | POA: Insufficient documentation

## 2018-04-19 DIAGNOSIS — N183 Chronic kidney disease, stage 3 unspecified: Secondary | ICD-10-CM | POA: Diagnosis present

## 2018-04-23 ENCOUNTER — Telehealth: Payer: Self-pay

## 2018-04-23 NOTE — Telephone Encounter (Signed)
Voicemail left with Shelby [name] to return call for EUS scheduling. Referral received from Dr. Marva Panda. Oncology Nurse Navigator Documentation  Navigator Location: [facility] (04/23/18 1100)   )Navigator Encounter Type: Telephone (04/23/18 1100) Telephone: Outgoing Call (04/23/18 1100)                       Barriers/Navigation Needs: Coordination of Care (04/23/18 1100)   Interventions: Coordination of Care (04/23/18 1100)   Coordination of Care: EUS (04/23/18 1100)                  Time Spent with Patient: 15 (04/23/18 1100)

## 2018-04-26 ENCOUNTER — Other Ambulatory Visit: Payer: Self-pay | Admitting: Gastroenterology

## 2018-04-26 DIAGNOSIS — K5790 Diverticulosis of intestine, part unspecified, without perforation or abscess without bleeding: Secondary | ICD-10-CM

## 2018-04-26 DIAGNOSIS — Q438 Other specified congenital malformations of intestine: Secondary | ICD-10-CM

## 2018-04-26 DIAGNOSIS — R195 Other fecal abnormalities: Secondary | ICD-10-CM

## 2018-04-26 DIAGNOSIS — K317 Polyp of stomach and duodenum: Secondary | ICD-10-CM

## 2018-04-27 ENCOUNTER — Telehealth: Payer: Self-pay

## 2018-04-27 NOTE — Telephone Encounter (Signed)
EUS scheduled for 6/13 with Dr. Shana Chute at [facility]. Educated on EUS. Went over instructions and copy mailed to home address along with contact information for future questions/needs. Denies anticoagulants. INSTRUCTIONS FOR ENDOSCOPIC ULTRASOUND -Your procedure has been scheduled for June 13th with Dr. Shana Chute at [facility]. -The hospital may contact you to pre-register over the phone.  -To get your scheduled arrival time, please call the Endoscopy unit at  [phone] between 1-3 p.m. on:  June 12th   -ON THE DAY OF YOU PROCEDURE:   1. If you are scheduled for a morning procedure, nothing to drink after midnight  -If you are scheduled for an afternoon procedure, you may have clear liquids until 5 hours prior  to the procedure but no carbonated drinks or broth  2. NO FOOD THE DAY OF YOUR PROCEDURE  3. You may take your heart, seizure, blood pressure, Parkinson's or breathing medications at  6am with just enough water to get your pills down  4. Do not take any oral Diabetic medications the morning of your procedure.  5. If you are a diabetic and are using insulin, please notify your prescribing physician of this  procedure, as your dose may need to be altered related to not being able to eat or drink.   6. Do not take vitamins, iron, or fish oil for 5 days before your procedure     -On the day of your procedure, come to the [facility] Admitting/Registration desk (First desk on the right) at the scheduled arrival time. You MUST have someone drive you home from your procedure. You must have a responsible adult with a valid driver's license who is on site throughout your entire procedure and who can stay with you for several hours after your procedure. You may not go home alone in a taxi, shuttle [place] or bus, as the drivers will not be responsible for you.  --If you have any questions please call me at the above contact  Oncology Nurse Navigator  Documentation  Navigator Location: [facility] (04/27/18 1500)   )Navigator Encounter Type: Telephone (04/27/18 1500) Telephone: Kathrin Penner Call (04/27/18 1500)                       Barriers/Navigation Needs: Coordination of Care (04/27/18 1500)   Interventions: Coordination of Care (04/27/18 1500)   Coordination of Care: EUS (04/27/18 1500)                  Time Spent with Patient: 30 (04/27/18 1500)

## 2018-05-11 ENCOUNTER — Inpatient Hospital Stay
Admission: RE | Admit: 2018-05-11 | Discharge: 2018-05-11 | Disposition: A | Discharge status: Home / Self Care | Source: Ambulatory Visit | Attending: Gastroenterology | Admitting: Gastroenterology

## 2018-05-19 ENCOUNTER — Telehealth: Payer: Self-pay

## 2018-05-19 ENCOUNTER — Encounter: Payer: Self-pay | Admitting: *Deleted

## 2018-05-19 NOTE — Telephone Encounter (Signed)
Received call from endo stating Ms. Hinton RaoStadler did not know anything about her EUS scheduled for tomorrow. Spoke to Ms. Hollenkamp and reviewed instructions for tomorrows procedure. Arrival time is at 1315. Read back performed. Oncology Nurse Navigator Documentation  Navigator Location: [facility] (05/19/18 1200)   )Navigator Encounter Type: Telephone (05/19/18 1200) Telephone: Incoming Call (05/19/18 1200)                                                  Time Spent with Patient: 15 (05/19/18 1200)

## 2018-05-20 ENCOUNTER — Ambulatory Visit
Admission: RE | Admit: 2018-05-20 | Discharge: 2018-05-20 | Disposition: A | Discharge status: Home / Self Care | Payer: Medicare Other | Source: Ambulatory Visit | Attending: Internal Medicine | Admitting: Internal Medicine

## 2018-05-20 ENCOUNTER — Ambulatory Visit: Payer: Medicare Other | Admitting: Certified Registered"

## 2018-05-20 ENCOUNTER — Encounter
Admission: RE | Disposition: A | Discharge status: Home / Self Care | Payer: Self-pay | Source: Ambulatory Visit | Attending: Internal Medicine

## 2018-05-20 ENCOUNTER — Encounter: Payer: Self-pay | Admitting: Anesthesiology

## 2018-05-20 DIAGNOSIS — I509 Heart failure, unspecified: Secondary | ICD-10-CM | POA: Diagnosis not present

## 2018-05-20 DIAGNOSIS — D509 Iron deficiency anemia, unspecified: Secondary | ICD-10-CM | POA: Diagnosis not present

## 2018-05-20 DIAGNOSIS — Z7984 Long term (current) use of oral hypoglycemic drugs: Secondary | ICD-10-CM | POA: Diagnosis not present

## 2018-05-20 DIAGNOSIS — G4733 Obstructive sleep apnea (adult) (pediatric): Secondary | ICD-10-CM | POA: Diagnosis not present

## 2018-05-20 DIAGNOSIS — E119 Type 2 diabetes mellitus without complications: Secondary | ICD-10-CM | POA: Diagnosis not present

## 2018-05-20 DIAGNOSIS — K317 Polyp of stomach and duodenum: Secondary | ICD-10-CM | POA: Insufficient documentation

## 2018-05-20 DIAGNOSIS — I110 Hypertensive heart disease with heart failure: Secondary | ICD-10-CM | POA: Insufficient documentation

## 2018-05-20 DIAGNOSIS — K219 Gastro-esophageal reflux disease without esophagitis: Secondary | ICD-10-CM | POA: Diagnosis not present

## 2018-05-20 DIAGNOSIS — E785 Hyperlipidemia, unspecified: Secondary | ICD-10-CM | POA: Diagnosis not present

## 2018-05-20 DIAGNOSIS — Z79899 Other long term (current) drug therapy: Secondary | ICD-10-CM | POA: Diagnosis not present

## 2018-05-20 DIAGNOSIS — K3189 Other diseases of stomach and duodenum: Secondary | ICD-10-CM | POA: Insufficient documentation

## 2018-05-20 HISTORY — PX: EUS: SHX5427

## 2018-05-20 SURGERY — ESOPHAGEAL ENDOSCOPIC ULTRASOUND (EUS) RADIAL
Anesthesia: General

## 2018-05-20 MED ORDER — PROPOFOL 500 MG/50ML IV EMUL
INTRAVENOUS | Status: DC | PRN
  Administered 2018-05-20: 100 ug/kg/min via INTRAVENOUS

## 2018-05-20 MED ORDER — GLYCOPYRROLATE 0.2 MG/ML IJ SOLN
INTRAMUSCULAR | Status: DC | PRN
  Administered 2018-05-20: 0.1 mg via INTRAVENOUS

## 2018-05-20 MED ORDER — GLYCOPYRROLATE 0.2 MG/ML IJ SOLN
INTRAMUSCULAR | Status: AC
  Filled 2018-05-20: qty 1

## 2018-05-20 MED ORDER — LIDOCAINE HCL (CARDIAC) PF 100 MG/5ML IV SOSY
PREFILLED_SYRINGE | INTRAVENOUS | Status: DC | PRN
  Administered 2018-05-20: 50 mg via INTRAVENOUS

## 2018-05-20 MED ORDER — PROPOFOL 10 MG/ML IV BOLUS
INTRAVENOUS | Status: AC
  Filled 2018-05-20: qty 20

## 2018-05-20 MED ORDER — PROPOFOL 500 MG/50ML IV EMUL
INTRAVENOUS | Status: AC
  Filled 2018-05-20: qty 50

## 2018-05-20 MED ORDER — LIDOCAINE HCL (PF) 1 % IJ SOLN
INTRAMUSCULAR | Status: AC
  Administered 2018-05-20: 0.3 mL
  Filled 2018-05-20: qty 2

## 2018-05-20 MED ORDER — SODIUM CHLORIDE 0.9 % IV SOLN
INTRAVENOUS | Status: DC
  Administered 2018-05-20: 1000 mL via INTRAVENOUS

## 2018-05-20 MED ORDER — PROPOFOL 10 MG/ML IV BOLUS
INTRAVENOUS | Status: DC | PRN
  Administered 2018-05-20: 100 mg via INTRAVENOUS

## 2018-05-20 MED ORDER — LIDOCAINE HCL (PF) 2 % IJ SOLN
INTRAMUSCULAR | Status: AC
  Filled 2018-05-20: qty 10

## 2018-05-20 NOTE — Op Note (Signed)
[facility] Gastroenterology Patient Name: Shelby [name] Procedure Date: 05/20/2018 1:08 PM MRN: [id number] Account #: [id number] Date of Birth: December 05, 1941 Admit Type: Outpatient Age: 77 Room: [facility] ENDO ROOM 3 Gender: Female Note Status: Finalized Procedure:            Upper GI endoscopy Indications:          Gastric mucosal mass/polyp found on endoscopy, Iron                        deficiency anemia Patient Profile:      Refer to note in patient chart for documentation of                        history and physical. Providers:            Murray Hodgkins. [place] Referring MD:         Lollie Sails, MD (Referring MD), Christena Flake. Raechel Ache,                        MD (Referring MD) Medicines:            Propofol per Anesthesia Complications:        No immediate complications. Procedure:            Pre-Anesthesia Assessment:                       Prior to the procedure, a History and Physical was                        performed, and patient medications and allergies were                        reviewed. The patient is competent. The risks and                        benefits of the procedure and the sedation options and                        risks were discussed with the patient. All questions                        were answered and informed consent was obtained.                        Patient identification and proposed procedure were                        verified by the physician, the nurse and the technician                        in the pre-procedure area. Mental Status Examination:                        alert and oriented. Airway Examination: normal                        oropharyngeal airway and neck mobility. Respiratory                        Examination: clear to auscultation. CV Examination:  normal. Prophylactic Antibiotics: The patient does not                        require prophylactic antibiotics. Prior Anticoagulants:                   The patient has taken no previous anticoagulant or                        antiplatelet agents. ASA Grade Assessment: III - A                        patient with severe systemic disease. After reviewing                        the risks and benefits, the patient was deemed in                        satisfactory condition to undergo the procedure. The                        anesthesia plan was to use monitored anesthesia care                        (MAC). Immediately prior to administration of                        medications, the patient was re-assessed for adequacy                        to receive sedatives. The heart rate, respiratory rate,                        oxygen saturations, blood pressure, adequacy of                        pulmonary ventilation, and response to care were                        monitored throughout the procedure. The physical status                        of the patient was re-assessed after the procedure.                       After obtaining informed consent, the endoscope was                        passed under direct vision. Throughout the procedure,                        the patient's blood pressure, pulse, and oxygen                        saturations were monitored continuously.The upper EUS                        was accomplished without difficulty. The patient                        tolerated the procedure well. The  Endoscope was                        introduced through the mouth, and advanced to the third                        part of duodenum. After obtaining informed consent, the                        endoscope was passed under direct vision. Throughout                        the procedure, the patient's blood pressure, pulse, and                        oxygen saturations were monitored continuously. Findings:      The esophagus was normal.      Two 5 to 10 mm sessile polyps were found in the prepyloric region of the        stomach. The polyps were removed with a hot snare. Resection and       retrieval were complete. To prevent bleeding after the polypectomy, two       hemostatic clips were successfully placed at each polypectomy site.       There was no bleeding at the end of the procedure.      The examined duodenum was normal. Impression:           - Normal esophagus.                       - Two inflammatory appearing gastric polyps. Suspicious                        in appearance for inflammatory polyps. Both polyps were                        resected and retrieved. EUS was not performed given                        characteristic appearance of benign inflammatory polyps                        with no submucosal component appreciated.                       - Normal examined duodenum. Recommendation:       - Discharge patient to home (ambulatory).                       - Await pathology results.                       - The findings and recommendations were discussed with                        the patient and her family.                       - The findings and recommendations were discussed with                        the referring GI  physician, Dr. Gustavo Lah. Procedure Code(s):    --- Professional ---                       [phone], Esophagogastroduodenoscopy, flexible, transoral;                        with removal of tumor(s), polyp(s), or other lesion(s)                        by snare technique Diagnosis Code(s):    --- Professional ---                       K31.89, Other diseases of stomach and duodenum                       D50.9, Iron deficiency anemia, unspecified                       K31.7, Polyp of stomach and duodenum CPT copyright 2017 American Medical Association. All rights reserved. The codes documented in this report are preliminary and upon coder review may  be revised to meet current compliance requirements. Attending Participation:      I personally performed the entire procedure  without the assistance of a       fellow, resident or surgical assistant. [place],  05/20/2018 1:50:16 PM This report has been signed electronically. Number of Addenda: 0 Note Initiated On: 05/20/2018 1:08 PM Estimated Blood Loss: Estimated blood loss: none.      [facility]

## 2018-05-20 NOTE — Transfer of Care (Signed)
Immediate Anesthesia Transfer of Care Note  Patient: Shelby [name]  Procedure(s) Performed: ESOPHAGEAL ENDOSCOPIC ULTRASOUND (EUS) RADIAL (N/A )  Patient Location: PACU and Endoscopy Unit  Anesthesia Type:General  Level of Consciousness: awake, alert , oriented and patient cooperative  Airway & Oxygen Therapy: Patient Spontanous Breathing  Post-op Assessment: Report given to RN, Post -op Vital signs reviewed and stable and Patient moving all extremities  Post vital signs: Reviewed and stable  Last Vitals:  Vitals Value Taken Time  BP    Temp    Pulse    Resp    SpO2      Last Pain:  Vitals:   05/20/18 1248  TempSrc: Tympanic  PainSc: 0-No pain         Complications: No apparent anesthesia complications

## 2018-05-20 NOTE — Discharge Instructions (Signed)
Discharge to home °

## 2018-05-20 NOTE — Anesthesia Preprocedure Evaluation (Signed)
Anesthesia Evaluation  Patient identified by MRN, date of birth, ID band Patient awake    Reviewed: Allergy & Precautions, NPO status , Patient's Chart, lab work & pertinent test results  History of Anesthesia Complications Negative for: history of anesthetic complications  Airway Mallampati: III       Dental  (+) Partial Upper, Dental Advidsory Given   Pulmonary sleep apnea (not using CPAP) , neg COPD,           Cardiovascular hypertension, Pt. on medications and Pt. on home beta blockers +CHF  (-) Past MI and (-) Orthopnea (-) dysrhythmias (-) Valvular Problems/Murmurs     Neuro/Psych neg Seizures    GI/Hepatic Neg liver ROS, GERD  Medicated and Controlled,  Endo/Other  diabetes, Type 2, Oral Hypoglycemic Agents  Renal/GU Renal InsufficiencyRenal disease     Musculoskeletal   Abdominal   Peds  Hematology  (+) anemia ,   Anesthesia Other Findings Past Medical History: No date: Anemia No date: Cardiomyopathy [facility](HCC)     Comment:  Ejection Fraction 30-35% per ECHO 2016 No date: CHF (congestive heart failure) (HCC) No date: Diabetes (HCC) No date: Dysrhythmia No date: Hyperlipidemia No date: Hypertension No date: Peripheral neuropathy No date: Peripheral neuropathy No date: Sleep apnea   Reproductive/Obstetrics                             Anesthesia Physical  Anesthesia Plan  ASA: III  Anesthesia Plan: General   Post-op Pain Management:    Induction: Intravenous  PONV Risk Score and Plan: 2 and Propofol infusion  Airway Management Planned: Nasal Cannula  Additional Equipment:   Intra-op Plan:   Post-operative Plan:   Informed Consent: I have reviewed the patients History and Physical, chart, labs and discussed the procedure including the risks, benefits and alternatives for the proposed anesthesia with the patient or authorized representative who has indicated his/her  understanding and acceptance.     Plan Discussed with:   Anesthesia Plan Comments:         Anesthesia Quick Evaluation

## 2018-05-20 NOTE — H&P (Signed)
Presented today for a routine endoscopic ultrasound for evaluation of gastric polyps seen on recent EGD.  Medical history reviewed and exam performed.  No contraindications to proceed with EUS as planned.  PMH: Anemia, Cardiomyopathy, CHF, diabetes, hyperlipidemia, HTN, neuropathy, OSA  Physical Examination: VSS Chest: CTAB Heart: RRR no murmurs Abd: Soft, NT, ND, normal bowel sounds

## 2018-05-20 NOTE — Anesthesia Postprocedure Evaluation (Signed)
Anesthesia Post Note  Patient: Aileen PilotCatherine Brown Geremia  Procedure(s) Performed: ESOPHAGEAL ENDOSCOPIC ULTRASOUND (EUS) RADIAL (N/A )  Patient location during evaluation: Endoscopy Anesthesia Type: General Level of consciousness: awake and alert, oriented and patient cooperative Pain management: satisfactory to patient Vital Signs Assessment: post-procedure vital signs reviewed and stable Respiratory status: spontaneous breathing and respiratory function stable Cardiovascular status: blood pressure returned to baseline and stable Postop Assessment: no headache, no backache, patient able to bend at knees, no apparent nausea or vomiting, adequate PO intake and able to ambulate Anesthetic complications: no     Last Vitals:  Vitals:   05/20/18 1350 05/20/18 1351  BP: 90/60 90/60  Pulse: 74 70  Resp: 20 20  Temp: (!) 36.1 C (!) 35.9 C  SpO2: 95% 96%    Last Pain:  Vitals:   05/20/18 1351  TempSrc: Tympanic  PainSc: 0-No pain                 Kerington Hildebrant H Skya Mccullum

## 2018-05-20 NOTE — Anesthesia Post-op Follow-up Note (Signed)
Anesthesia QCDR form completed.        

## 2018-05-24 LAB — SURGICAL PATHOLOGY

## 2018-05-25 ENCOUNTER — Ambulatory Visit
Admission: RE | Admit: 2018-05-25 | Discharge: 2018-05-25 | Disposition: A | Discharge status: Home / Self Care | Payer: Medicare Other | Source: Ambulatory Visit | Attending: Gastroenterology | Admitting: Gastroenterology

## 2018-05-25 DIAGNOSIS — K5790 Diverticulosis of intestine, part unspecified, without perforation or abscess without bleeding: Secondary | ICD-10-CM

## 2018-05-25 DIAGNOSIS — R195 Other fecal abnormalities: Secondary | ICD-10-CM

## 2018-05-25 DIAGNOSIS — K317 Polyp of stomach and duodenum: Secondary | ICD-10-CM

## 2018-05-25 DIAGNOSIS — Q438 Other specified congenital malformations of intestine: Secondary | ICD-10-CM

## 2018-05-26 ENCOUNTER — Encounter: Payer: Self-pay | Admitting: Internal Medicine

## 2018-05-27 ENCOUNTER — Ambulatory Visit: Payer: Medicare Other | Attending: Neurology

## 2018-06-03 ENCOUNTER — Ambulatory Visit: Payer: Medicare Other | Attending: Family | Admitting: Family

## 2018-06-03 ENCOUNTER — Encounter: Payer: Self-pay | Admitting: Family

## 2018-06-03 VITALS — BP 111/61 | HR 75 | Resp 18 | Ht 64.0 in | Wt 222.1 lb

## 2018-06-03 DIAGNOSIS — Z79899 Other long term (current) drug therapy: Secondary | ICD-10-CM | POA: Insufficient documentation

## 2018-06-03 DIAGNOSIS — E1140 Type 2 diabetes mellitus with diabetic neuropathy, unspecified: Secondary | ICD-10-CM | POA: Insufficient documentation

## 2018-06-03 DIAGNOSIS — Z888 Allergy status to other drugs, medicaments and biological substances status: Secondary | ICD-10-CM | POA: Insufficient documentation

## 2018-06-03 DIAGNOSIS — Z8249 Family history of ischemic heart disease and other diseases of the circulatory system: Secondary | ICD-10-CM | POA: Insufficient documentation

## 2018-06-03 DIAGNOSIS — I5022 Chronic systolic (congestive) heart failure: Secondary | ICD-10-CM | POA: Insufficient documentation

## 2018-06-03 DIAGNOSIS — E1129 Type 2 diabetes mellitus with other diabetic kidney complication: Secondary | ICD-10-CM

## 2018-06-03 DIAGNOSIS — I10 Essential (primary) hypertension: Secondary | ICD-10-CM

## 2018-06-03 DIAGNOSIS — D649 Anemia, unspecified: Secondary | ICD-10-CM | POA: Insufficient documentation

## 2018-06-03 DIAGNOSIS — I110 Hypertensive heart disease with heart failure: Secondary | ICD-10-CM | POA: Diagnosis not present

## 2018-06-03 DIAGNOSIS — E785 Hyperlipidemia, unspecified: Secondary | ICD-10-CM | POA: Insufficient documentation

## 2018-06-03 DIAGNOSIS — Z90710 Acquired absence of both cervix and uterus: Secondary | ICD-10-CM | POA: Insufficient documentation

## 2018-06-03 DIAGNOSIS — Z7982 Long term (current) use of aspirin: Secondary | ICD-10-CM | POA: Diagnosis not present

## 2018-06-03 DIAGNOSIS — G4733 Obstructive sleep apnea (adult) (pediatric): Secondary | ICD-10-CM | POA: Diagnosis not present

## 2018-06-03 DIAGNOSIS — Z833 Family history of diabetes mellitus: Secondary | ICD-10-CM | POA: Insufficient documentation

## 2018-06-03 LAB — GLUCOSE, CAPILLARY: Glucose-Capillary: 102 mg/dL — ABNORMAL HIGH (ref 70–99)

## 2018-06-03 NOTE — Patient Instructions (Signed)
Continue weighing daily and call for an overnight weight gain of > 2 pounds or a weekly weight gain of >5 pounds.  Bring medication bottles to every visit 

## 2018-06-03 NOTE — Progress Notes (Signed)
Patient ID: Shelby Shelby [name], female    DOB: 04/14/1941, 77 y.o.   MRN: [id number]  HPI  Shelby Shelby [name] is a 77 y/o female with a history of DM, hyperlipidemia, HTN, anemia, obstructive sleep apnea, neuropathy and chronic heart failure.   Echo report from 02/13/15 reviewed and showed an EF of 35% along with moderate MR and mild PHTN. Function study on 03/16/15 showed an EF of 52%.   Was in the ED 01/23/18 due to renal colic where she was treated and released.   She presents today for a follow-up visit with a chief complaint of moderate fatigue upon minimal exertion. She describes this as chronic in nature having been present for several years. She has associated pedal edema, numbness in feet and gradual weight gain along with this. She denies any difficulty sleeping, abdominal distention, palpitations, chest pain, shortness of breath, cough or dizziness. Is scheduled for a sleep study late July 2019.   Past Medical History:  Diagnosis Date  . Anemia   . Cardiomyopathy (HCC)    Ejection Fraction 30-35% per ECHO 2016  . CHF (congestive heart failure) (HCC)   . Diabetes (HCC)   . Dysrhythmia   . Hyperlipidemia   . Hypertension   . Peripheral neuropathy   . Peripheral neuropathy   . Sleep apnea    Past Surgical History:  Procedure Laterality Date  . ABDOMINAL HYSTERECTOMY    . COLONOSCOPY    . COLONOSCOPY N/A 04/01/2018   Procedure: COLONOSCOPY;  Surgeon: Christena Deem, MD;  Location: [facility] ENDOSCOPY;  Service: Endoscopy;  Laterality: N/A;  . COLONOSCOPY WITH PROPOFOL N/A 12/29/2017   Procedure: COLONOSCOPY WITH PROPOFOL;  Surgeon: Christena Deem, MD;  Location: [facility] ENDOSCOPY;  Service: Endoscopy;  Laterality: N/A;  . ESOPHAGOGASTRODUODENOSCOPY (EGD) WITH PROPOFOL N/A 12/29/2017   Procedure: ESOPHAGOGASTRODUODENOSCOPY (EGD) WITH PROPOFOL;  Surgeon: Christena Deem, MD;  Location: [facility] ENDOSCOPY;  Service: Endoscopy;  Laterality: N/A;  . ESOPHAGOGASTRODUODENOSCOPY (EGD) WITH  PROPOFOL N/A 04/01/2018   Procedure: ESOPHAGOGASTRODUODENOSCOPY (EGD) WITH PROPOFOL;  Surgeon: Christena Deem, MD;  Location: [facility] ENDOSCOPY;  Service: Endoscopy;  Laterality: N/A;  . EUS N/A 05/20/2018   Procedure: ESOPHAGEAL ENDOSCOPIC ULTRASOUND (EUS) RADIAL;  Surgeon: Bearl Mulberry, MD;  Location: [facility] ENDOSCOPY;  Service: Gastroenterology;  Laterality: N/A;   Family History  Problem Relation Age of Onset  . Diabetes Mother   . Heart disease Mother   . Bladder Cancer Neg Hx   . Kidney cancer Neg Hx    Social History   Tobacco Use  . Smoking status: Never Smoker  . Smokeless tobacco: Never Used  Substance Use Topics  . Alcohol use: No   Allergies  Allergen Reactions  . Mirabegron Other (See Comments)    Elevated BP   Prior to Admission medications   Medication Sig Start Date End Date Taking? Authorizing Provider  amLODipine (NORVASC) 5 MG tablet Take by mouth daily.  12/11/15  Yes [provider]  aspirin EC 81 MG tablet Take 81 mg by mouth daily.   Yes [provider]  atorvastatin (LIPITOR) 80 MG tablet Take by mouth. 12/11/15  Yes [provider]  brimonidine (ALPHAGAN) 0.2 % ophthalmic solution Place 1 drop into Shelby [name] eyes 3 (three) times daily. Takes BID   Yes [provider]  candesartan (ATACAND) 32 MG tablet Take by mouth daily.  07/15/16  Yes [provider]  carvedilol (COREG) 25 MG tablet Take by mouth 2 (two) times daily with a meal.  07/15/16  Yes [provider]  conjugated estrogens (PREMARIN) vaginal cream Place 1 Applicatorful vaginally daily. Apply 0.5mg  (pea-sized amount)  just inside the vaginal introitus with a finger-tip every night for two weeks and then Monday, Wednesday and Friday nights. 03/11/18  Yes McGowan, Carollee Herter A, PA-C  donepezil (ARICEPT) 10 MG tablet Take 10 mg by mouth at bedtime.   Yes [provider]  ferrous sulfate 325 (65 FE) MG tablet Take by mouth. Takes one pill every 2-3  days to minimize constipation   Yes [provider]  fesoterodine (TOVIAZ) 8 MG TB24 tablet Take 1 tablet (8 mg total) by mouth daily. 03/11/18  Yes McGowan, Carollee Herter A, PA-C  Multiple Vitamin (MULTI-VITAMINS) TABS Take by mouth.   Yes [provider]  pantoprazole (PROTONIX) 40 MG tablet Take 40 mg by mouth daily.   Yes [provider]  travoprost, benzalkonium, (TRAVATAN) 0.004 % ophthalmic solution Place 1 drop into Shelby [name] eyes at bedtime.    Yes [provider]    Review of Systems  Constitutional: Positive for fatigue. Negative for appetite change.  HENT: Negative for congestion, postnasal drip and sore throat.   Eyes: Negative.   Respiratory: Negative for cough, chest tightness and shortness of breath.   Cardiovascular: Positive for leg swelling (minimal). Negative for chest pain and palpitations.  Gastrointestinal: Negative for abdominal distention and abdominal pain.  Endocrine: Negative.   Genitourinary: Negative.   Musculoskeletal: Negative for arthralgias, back pain and neck pain.  Skin: Positive for rash (lower legs).       Itching lower legs  Allergic/Immunologic: Negative.   Neurological: Positive for numbness (tingling in feet due to neuropathy). Negative for dizziness and light-headedness.  Hematological: Negative for adenopathy. Does not bruise/bleed easily.  Psychiatric/Behavioral: Negative for dysphoric mood and sleep disturbance. The patient is not nervous/anxious.    Vitals:   06/03/18 0926  BP: 111/61  Pulse: 75  Resp: 18  SpO2: 99%  Weight: 222 lb 2 oz (100.8 kg)  Height: 5\' 4"  (1.626 m)   Wt Readings from Last 3 Encounters:  06/03/18 222 lb 2 oz (100.8 kg)  05/20/18 208 lb (94.3 kg)  04/01/18 208 lb (94.3 kg)   Lab Results  Component Value Date   CREATININE 1.65 (H) 03/18/2018   CREATININE 1.33 (H) 02/16/2018   CREATININE 1.46 (H) 01/23/2018    Physical Exam  Constitutional: She is oriented to person, place, and  time. She appears well-developed and well-nourished.  HENT:  Head: Normocephalic and atraumatic.  Neck: Normal range of motion. Neck supple. No JVD present.  Cardiovascular: Normal rate and regular rhythm.  Pulmonary/Chest: Effort normal. She has no wheezes. She has no rales.  Abdominal: Soft. She exhibits no distension. There is no tenderness.  Musculoskeletal: She exhibits no edema or tenderness.  Neurological: She is alert and oriented to person, place, and time.  Skin: Skin is warm and dry.  Psychiatric: She has a normal mood and affect. Her behavior is normal. Thought content normal.  Nursing note and vitals reviewed.  Assessment & Plan:  1: Chronic heart failure with reduced ejection fraction- - NYHA class III - euvolemic today - weighing daily. Reminded to call for an overnight weight gain of >2 pounds or a weekly weight gain of >5 pounds - weight up 13 pounds since she was last here 03/18/18 - trying to not add salt  - drinks ~32 ounces of water daily along with a a soda or tea - saw cardiology (Fath) 12/04/17  -  riding stationary bike daily for 1 hour each day for the last 6 weeks - echo has been scheduled for 06/09/18; should EF remain <40%, consider changing candesartan to entresto - PharmD reconciled medications with the patient  2: HTN- - BP looks good today - BMP from 03/18/18 reviewed and showed sodium 141, potassium 4.7 and GFR 34   3: Diabetes- - fasting glucose in clinic today was 102 - saw PCP Harrington Challenger([place]) 04/19/18 - A1c on 01/19/18 was 5.9%  4: Obstructive sleep apnea- - last sleep study was ~15 years ago - currently has a sleep study scheduled in July  Patient did not bring her medications nor a list. Each medication was verbally reviewed with the patient and she was encouraged to bring the bottles to every visit to confirm accuracy of list.  Return in 3 months or sooner for any questions/problems before then.

## 2018-06-09 ENCOUNTER — Ambulatory Visit: Attending: Family

## 2018-06-15 ENCOUNTER — Ambulatory Visit: Admission: RE | Admit: 2018-06-15 | Payer: Medicare Other | Source: Ambulatory Visit

## 2018-06-21 NOTE — Progress Notes (Unsigned)
06/22/2018 9:36 PM   Shelby [name] 1941/01/07 [id number]  Referring provider: Mickey Farber, MD [address] [facility] [place], [place] [address]  No chief complaint on file.   HPI: Patient is a 77 year old African American female with a history of a left ureteral stone, mixed urinary incontinence and vaginal atrophy who follow ups today to review her chest CT results.    She was seen at [facility] ED on January 23, 2018 after she experienced some lower abdominal pain and gross hematuria.  Her WBC count was 7.8.  Her UA was positive for too numerous to count RBCs and 6-30 WBCs.  Her creatinine was 1.46.  CT Renal stone study performed on 01/23/2018 noted a 3 mm calculus in the distal left ureter with resultant mild left hydroureteronephrosis.  Additional punctate nonobstructive bilateral nephrolithiasis.  Cholelithiasis.  6 mm pulmonary nodule in the left lower lobe. Non-contrast chest CT at 6-12 months is recommended. If the nodule is stable at time of repeat CT, then future CT at 18-24 months (from today's scan) is considered optional for low-risk patients, but is recommended for high-risk patients. This recommendation follows the consensus statement: Guidelines for Management of Incidental Pulmonary Nodules Detected on CT Images: From the Fleischner Society 2017; Radiology 2017; 284:228-243.  Aortic atherosclerosis (ICD10-I70.0).  RUS on 02/25/2018 demonstrates an interval resolution of left hydronephrosis.  Bilateral small renal cysts.    PMH: Past Medical History:  Diagnosis Date  . Anemia   . Cardiomyopathy (HCC)    Ejection Fraction 30-35% per ECHO 2016  . CHF (congestive heart failure) (HCC)   . Diabetes (HCC)   . Dysrhythmia   . Hyperlipidemia   . Hypertension   . Peripheral neuropathy   . Peripheral neuropathy   . Sleep apnea     Surgical History: Past Surgical History:  Procedure Laterality Date  . ABDOMINAL HYSTERECTOMY    . COLONOSCOPY     . COLONOSCOPY N/A 04/01/2018   Procedure: COLONOSCOPY;  Surgeon: Christena Deem, MD;  Location: [facility] ENDOSCOPY;  Service: Endoscopy;  Laterality: N/A;  . COLONOSCOPY WITH PROPOFOL N/A 12/29/2017   Procedure: COLONOSCOPY WITH PROPOFOL;  Surgeon: Christena Deem, MD;  Location: [facility] ENDOSCOPY;  Service: Endoscopy;  Laterality: N/A;  . ESOPHAGOGASTRODUODENOSCOPY (EGD) WITH PROPOFOL N/A 12/29/2017   Procedure: ESOPHAGOGASTRODUODENOSCOPY (EGD) WITH PROPOFOL;  Surgeon: Christena Deem, MD;  Location: [facility] ENDOSCOPY;  Service: Endoscopy;  Laterality: N/A;  . ESOPHAGOGASTRODUODENOSCOPY (EGD) WITH PROPOFOL N/A 04/01/2018   Procedure: ESOPHAGOGASTRODUODENOSCOPY (EGD) WITH PROPOFOL;  Surgeon: Christena Deem, MD;  Location: [facility] ENDOSCOPY;  Service: Endoscopy;  Laterality: N/A;  . EUS N/A 05/20/2018   Procedure: ESOPHAGEAL ENDOSCOPIC ULTRASOUND (EUS) RADIAL;  Surgeon: Bearl Mulberry, MD;  Location: [facility] ENDOSCOPY;  Service: Gastroenterology;  Laterality: N/A;    Home Medications:  Allergies as of 06/22/2018      Reactions   Mirabegron Other (See Comments)   Elevated BP   Furosemide Other (See Comments)   Legs swelling      Medication List        Accurate as of 06/21/18  9:36 PM. Always use your most recent med list.          amLODipine 5 MG tablet Commonly known as:  NORVASC Take by mouth daily.   aspirin EC 81 MG tablet Take 81 mg by mouth daily.   atorvastatin 80 MG tablet Commonly known as:  LIPITOR Take by mouth.   brimonidine 0.2 % ophthalmic solution Commonly  known as:  ALPHAGAN Place 1 drop into both eyes 3 (three) times daily. Takes BID   candesartan 32 MG tablet Commonly known as:  ATACAND Take by mouth daily.   carvedilol 25 MG tablet Commonly known as:  COREG Take by mouth 2 (two) times daily with a meal.   conjugated estrogens vaginal cream Commonly known as:  PREMARIN Place 1 Applicatorful vaginally daily. Apply 0.5mg  (pea-sized amount)  just  inside the vaginal introitus with a finger-tip every night for two weeks and then Monday, Wednesday and Friday nights.   donepezil 10 MG tablet Commonly known as:  ARICEPT Take 10 mg by mouth at bedtime.   ferrous sulfate 325 (65 FE) MG tablet Take by mouth. Takes one pill every 2-3 days to minimize constipation   fesoterodine 8 MG Tb24 tablet Commonly known as:  TOVIAZ Take 1 tablet (8 mg total) by mouth daily.   MULTI-VITAMINS Tabs Take by mouth.   pantoprazole 40 MG tablet Commonly known as:  PROTONIX Take 40 mg by mouth daily.   travoprost (benzalkonium) 0.004 % ophthalmic solution Commonly known as:  TRAVATAN Place 1 drop into both eyes at bedtime.       Allergies:  Allergies  Allergen Reactions  . Mirabegron Other (See Comments)    Elevated BP  . Furosemide Other (See Comments)    Legs swelling     Family History: Family History  Problem Relation Age of Onset  . Diabetes Mother   . Heart disease Mother   . Bladder Cancer Neg Hx   . Kidney cancer Neg Hx     Social History:  reports that she has never smoked. She has never used smokeless tobacco. She reports that she does not drink alcohol or use drugs.  ROS:                                        Physical Exam: There were no vitals taken for this visit.  Constitutional: Well nourished. Alert and oriented, No acute distress. HEENT: [place] AT, moist mucus membranes. Trachea midline, no masses. Cardiovascular: No clubbing, cyanosis, or edema. Respiratory: Normal respiratory effort, no increased work of breathing. GI: Abdomen is soft, non tender, non distended, no abdominal masses. Liver and spleen not palpable.  No hernias appreciated.  Stool sample for occult testing is not indicated.   GU: No CVA tenderness.  No bladder fullness or masses.   Skin: No rashes, bruises or suspicious lesions. Lymph: No cervical or inguinal adenopathy. Neurologic: Grossly intact, no focal deficits,  moving all 4 extremities. Psychiatric: Normal mood and affect.  Pertinent Imaging: CLINICAL DATA:  Hydronephrosis with urinary obstruction. History of ureteral calculus.  EXAM: RENAL / URINARY TRACT ULTRASOUND COMPLETE  COMPARISON:  CT abdomen 01/23/2018  FINDINGS: Right Kidney:  Length: 9.2 cm. Increased renal cortical echogenicity. No hydronephrosis visualized. Two anechoic right upper pole renal masses measuring 6 x 6 x 5 mm and 6 x 7 x 7 mm respectively consistent with cysts.  Left Kidney:  Length: 9.1 cm. Increased renal cortical echogenicity. Echogenicity within normal limits. No hydronephrosis visualized. 1.4 x 1.7 x 1.4 cm anechoic left upper pole renal mass most consistent with a cyst. 9 x 9 x 9 mm anechoic left lower pole renal mass most consistent with a cyst.  Bladder:  Appears normal for degree of bladder distention. Prevoid bladder volume 164 mL. Bilateral ureteral jets are noted.  IMPRESSION: 1. Interval resolution of left hydronephrosis. 2. Bilateral small renal cysts.   Electronically Signed   By: Elige KoHetal  Patel   On: 02/25/2018 15:32  I have independently reviewed the films  Assessment & Plan:    1. Left ureteral stone  - most likely passed as patient is symptom free  2. Left hydronephrosis  - the hydronephrosis has resolved.    3. Gross hematuria  - likely due to passage of stone  - patient does not report further gross hematuria and last UA was negative  4. Pulmonary nodule CT of chest in 6 months (06/2018)  5. Hypotension Patient was found to be hypotensive at the start of her visit - she was given some orange juice and BP increased to normal value - she denies chest pain, dizziness or fatigue Advised to contact PCP  No follow-ups on file.  These notes generated with voice recognition software. I apologize for typographical errors.  Michiel CowboySHANNON Neizan Debruhl, PA-C  [facility] [address] [place], [place] [address] [phone]

## 2018-06-22 ENCOUNTER — Ambulatory Visit: Payer: Medicare Other | Admitting: Urology

## 2018-06-24 ENCOUNTER — Ambulatory Visit: Payer: Medicare Other | Attending: Neurology

## 2018-06-24 ENCOUNTER — Other Ambulatory Visit (INDEPENDENT_AMBULATORY_CARE_PROVIDER_SITE_OTHER): Payer: Medicare Other | Admitting: Internal Medicine

## 2018-06-24 ENCOUNTER — Encounter: Payer: Self-pay | Admitting: Urology

## 2018-06-24 DIAGNOSIS — G4733 Obstructive sleep apnea (adult) (pediatric): Secondary | ICD-10-CM | POA: Insufficient documentation

## 2018-06-24 DIAGNOSIS — G4710 Hypersomnia, unspecified: Secondary | ICD-10-CM | POA: Diagnosis present

## 2018-06-24 DIAGNOSIS — R0683 Snoring: Secondary | ICD-10-CM | POA: Insufficient documentation

## 2018-06-30 ENCOUNTER — Ambulatory Visit
Admission: RE | Admit: 2018-06-30 | Discharge: 2018-06-30 | Disposition: A | Discharge status: Home / Self Care | Payer: Medicare Other | Source: Ambulatory Visit | Attending: Urology | Admitting: Urology

## 2018-06-30 DIAGNOSIS — N281 Cyst of kidney, acquired: Secondary | ICD-10-CM | POA: Insufficient documentation

## 2018-06-30 DIAGNOSIS — R911 Solitary pulmonary nodule: Secondary | ICD-10-CM

## 2018-06-30 DIAGNOSIS — E041 Nontoxic single thyroid nodule: Secondary | ICD-10-CM | POA: Insufficient documentation

## 2018-07-04 NOTE — Progress Notes (Signed)
07/06/2018 10:31 AM   Shelby [name] 1941-02-05 [id number]  Referring provider: Mickey Farber, MD [address] [facility] [place], [place] [address]  Chief Complaint  Patient presents with  . Follow-up    HPI: Patient is a 77 year old African American female with a history of a left ureteral stone, mixed urinary incontinence and vaginal atrophy who follow ups today to review her chest CT results.    She was seen at [facility] ED on January 23, 2018 after she experienced some lower abdominal pain and gross hematuria.  Her WBC count was 7.8.  Her UA was positive for too numerous to count RBCs and 6-30 WBCs.  Her creatinine was 1.46.  CT Renal stone study performed on 01/23/2018 noted a 3 mm calculus in the distal left ureter with resultant mild left hydroureteronephrosis.  Additional punctate nonobstructive bilateral nephrolithiasis.  Cholelithiasis.  6 mm pulmonary nodule in the left lower lobe. Non-contrast chest CT at 6-12 months is recommended. If the nodule is stable at time of repeat CT, then future CT at 18-24 months (from today's scan) is considered optional for low-risk patients, but is recommended for high-risk patients. This recommendation follows the consensus statement: Guidelines for Management of Incidental Pulmonary Nodules Detected on CT Images: From the Fleischner Society 2017; Radiology 2017; 284:228-243.  Aortic atherosclerosis (ICD10-I70.0).  RUS on 02/25/2018 demonstrates an interval resolution of left hydronephrosis.  Bilateral small renal cysts.  Chest CT on 06/30/2018 noted 6 mm left lower lobe pulmonary nodule identified on the previous abdomen/pelvis CT has resolved in the interval.  1.9 cm right thyroid nodule. Thyroid ultrasound recommended to further evaluate.  2 cm exophytic lesion upper pole left kidney with attenuation too high to be a simple cyst. While this may be a cyst complicated by proteinaceous debris or hemorrhage, dedicated  abdominal MRI recommended to confirm.  Aortic Atherosclerois   She is having nocturia x 3, but her CPAP has not come yet.    PMH: Past Medical History:  Diagnosis Date  . Anemia   . Cardiomyopathy (HCC)    Ejection Fraction 30-35% per ECHO 2016  . CHF (congestive heart failure) (HCC)   . Diabetes (HCC)   . Dysrhythmia   . Hyperlipidemia   . Hypertension   . Peripheral neuropathy   . Peripheral neuropathy   . Sleep apnea     Surgical History: Past Surgical History:  Procedure Laterality Date  . ABDOMINAL HYSTERECTOMY    . COLONOSCOPY    . COLONOSCOPY N/A 04/01/2018   Procedure: COLONOSCOPY;  Surgeon: Christena Deem, MD;  Location: [facility] ENDOSCOPY;  Service: Endoscopy;  Laterality: N/A;  . COLONOSCOPY WITH PROPOFOL N/A 12/29/2017   Procedure: COLONOSCOPY WITH PROPOFOL;  Surgeon: Christena Deem, MD;  Location: [facility] ENDOSCOPY;  Service: Endoscopy;  Laterality: N/A;  . ESOPHAGOGASTRODUODENOSCOPY (EGD) WITH PROPOFOL N/A 12/29/2017   Procedure: ESOPHAGOGASTRODUODENOSCOPY (EGD) WITH PROPOFOL;  Surgeon: Christena Deem, MD;  Location: [facility] ENDOSCOPY;  Service: Endoscopy;  Laterality: N/A;  . ESOPHAGOGASTRODUODENOSCOPY (EGD) WITH PROPOFOL N/A 04/01/2018   Procedure: ESOPHAGOGASTRODUODENOSCOPY (EGD) WITH PROPOFOL;  Surgeon: Christena Deem, MD;  Location: [facility] ENDOSCOPY;  Service: Endoscopy;  Laterality: N/A;  . EUS N/A 05/20/2018   Procedure: ESOPHAGEAL ENDOSCOPIC ULTRASOUND (EUS) RADIAL;  Surgeon: Bearl Mulberry, MD;  Location: [facility] ENDOSCOPY;  Service: Gastroenterology;  Laterality: N/A;    Home Medications:  Allergies as of 07/06/2018      Reactions   Mirabegron Other (See Comments)   Elevated BP  Furosemide Other (See Comments)   Legs swelling      Medication List        Accurate as of 07/06/18 10:31 AM. Always use your most recent med list.          amLODipine 5 MG tablet Commonly known as:  NORVASC Take by mouth daily.   aspirin EC 81 MG tablet Take  81 mg by mouth daily.   atorvastatin 80 MG tablet Commonly known as:  LIPITOR Take by mouth.   brimonidine 0.2 % ophthalmic solution Commonly known as:  ALPHAGAN Place 1 drop into both eyes 3 (three) times daily. Takes BID   candesartan 32 MG tablet Commonly known as:  ATACAND Take by mouth daily.   carvedilol 25 MG tablet Commonly known as:  COREG Take by mouth 2 (two) times daily with a meal.   conjugated estrogens vaginal cream Commonly known as:  PREMARIN Place 1 Applicatorful vaginally daily. Apply 0.5mg  (pea-sized amount)  just inside the vaginal introitus with a finger-tip every night for two weeks and then Monday, Wednesday and Friday nights.   donepezil 10 MG tablet Commonly known as:  ARICEPT Take 10 mg by mouth at bedtime.   ferrous sulfate 325 (65 FE) MG tablet Take by mouth. Takes one pill every 2-3 days to minimize constipation   fesoterodine 8 MG Tb24 tablet Commonly known as:  TOVIAZ Take 1 tablet (8 mg total) by mouth daily.   MULTI-VITAMINS Tabs Take by mouth.   pantoprazole 40 MG tablet Commonly known as:  PROTONIX Take 40 mg by mouth daily.   travoprost (benzalkonium) 0.004 % ophthalmic solution Commonly known as:  TRAVATAN Place 1 drop into both eyes at bedtime.       Allergies:  Allergies  Allergen Reactions  . Mirabegron Other (See Comments)    Elevated BP  . Furosemide Other (See Comments)    Legs swelling     Family History: Family History  Problem Relation Age of Onset  . Diabetes Mother   . Heart disease Mother   . Bladder Cancer Neg Hx   . Kidney cancer Neg Hx     Social History:  reports that she has never smoked. She has never used smokeless tobacco. She reports that she does not drink alcohol or use drugs.  ROS: UROLOGY Frequent Urination?: No Hard to postpone urination?: No Burning/pain with urination?: No Get up at night to urinate?: No Leakage of urine?: No Urine stream starts and stops?: No Trouble starting  stream?: No Do you have to strain to urinate?: No Blood in urine?: No Urinary tract infection?: No Sexually transmitted disease?: No Injury to kidneys or bladder?: No Painful intercourse?: No Weak stream?: No Currently pregnant?: No Vaginal bleeding?: No Last menstrual period?: n  Gastrointestinal Nausea?: No Vomiting?: No Indigestion/heartburn?: No Diarrhea?: No Constipation?: No  Constitutional Fever: No Night sweats?: No Weight loss?: No Fatigue?: No  Skin Skin rash/lesions?: No Itching?: No  Eyes Blurred vision?: No  Ears/Nose/Throat Sore throat?: No Sinus problems?: No  Hematologic/Lymphatic Swollen glands?: No Easy bruising?: No  Cardiovascular Leg swelling?: Yes Chest pain?: No  Respiratory Cough?: No Shortness of breath?: No  Endocrine Excessive thirst?: No  Musculoskeletal Back pain?: No Joint pain?: No  Neurological Headaches?: No Dizziness?: No  Psychologic Depression?: Yes Anxiety?: No  Physical Exam: BP 94/60   Pulse 66   Ht 5\' 4"  (1.626 m)   Wt 222 lb 3.2 oz (100.8 kg)   BMI 38.14 kg/m   Constitutional: Well nourished.  Alert and oriented, No acute distress. HEENT: [place] AT, moist mucus membranes. Trachea midline, no masses. Cardiovascular: No clubbing, cyanosis, or edema. Respiratory: Normal respiratory effort, no increased work of breathing. Skin: No rashes, bruises or suspicious lesions. Lymph: No cervical or inguinal adenopathy. Neurologic: Grossly intact, no focal deficits, moving all 4 extremities. Psychiatric: Normal mood and affect.  Pertinent Imaging: CLINICAL DATA:  Pulmonary nodule.  EXAM: CT CHEST WITHOUT CONTRAST  TECHNIQUE: Multidetector CT imaging of the chest was performed following the standard protocol without IV contrast.  COMPARISON:  Abdomen and pelvis CT 01/23/2018.  FINDINGS: Cardiovascular: Heart size upper normal. Tiny pericardial effusion evident. There is abdominal aortic  atherosclerosis without aneurysm.  Mediastinum/Nodes: No mediastinal lymphadenopathy. No evidence for gross hilar lymphadenopathy although assessment is limited by the lack of intravenous contrast on today's study. The esophagus has normal imaging features. There is no axillary lymphadenopathy. 1.9 cm right thyroid nodule evident.  Lungs/Pleura: Scattered subsegmental atelectasis or linear scar noted right lung. Tiny nodule identified on prior study in the left lung base has resolved in the interval. Subsegmental atelectasis or scarring noted in the left lung base with new tiny ill-defined nodular opacities in the posterior left costophrenic sulcus.  Upper Abdomen: Tiny layering gallstones evident. 2 cm exophytic lesion upper pole left kidney has attenuation too high to be a simple cyst. Hyperattenuating lesions at the lower pole the left kidney noted on the previous study of not been included on today's exam.  Musculoskeletal: No worrisome lytic or sclerotic osseous abnormality.  IMPRESSION: 1. 6 mm left lower lobe pulmonary nodule identified on the previous abdomen/pelvis CT has resolved in the interval. 2. 1.9 cm right thyroid nodule. Thyroid ultrasound recommended to further evaluate. 3. 2 cm exophytic lesion upper pole left kidney with attenuation too high to be a simple cyst. While this may be a cyst complicated by proteinaceous debris or hemorrhage, dedicated abdominal MRI recommended to confirm. 4.  Aortic Atherosclerois (ICD10-170.0)   Electronically Signed   By: Kennith CenterEric  Mansell M.D.   On: 06/30/2018 14:18 I have independently reviewed the films  Assessment & Plan:    1. Pulmonary nodule Resolved on 06/2018 CT  2. Thyroid nodule  Obtain thyroid ultrasound Explained to patient will need to follow up with PCP  3. Left renal lesion 2 cm exophytic lesion with attenuation too high to be a simple cyst MRI of kidneys ordered   Return for I will call  patient with results.  These notes generated with voice recognition software. I apologize for typographical errors.  Michiel CowboySHANNON Paddy Walthall, PA-C  [facility] [address] [place], [place] [address] [phone]

## 2018-07-06 ENCOUNTER — Ambulatory Visit (INDEPENDENT_AMBULATORY_CARE_PROVIDER_SITE_OTHER): Payer: Medicare Other | Admitting: Urology

## 2018-07-06 ENCOUNTER — Other Ambulatory Visit: Payer: Self-pay

## 2018-07-06 ENCOUNTER — Encounter: Payer: Self-pay | Admitting: Urology

## 2018-07-06 VITALS — BP 94/60 | HR 66 | Ht 64.0 in | Wt 222.2 lb

## 2018-07-06 DIAGNOSIS — N289 Disorder of kidney and ureter, unspecified: Secondary | ICD-10-CM | POA: Diagnosis not present

## 2018-07-06 DIAGNOSIS — R911 Solitary pulmonary nodule: Secondary | ICD-10-CM | POA: Diagnosis not present

## 2018-07-06 DIAGNOSIS — E041 Nontoxic single thyroid nodule: Secondary | ICD-10-CM

## 2018-07-06 DIAGNOSIS — N281 Cyst of kidney, acquired: Secondary | ICD-10-CM

## 2018-07-12 ENCOUNTER — Other Ambulatory Visit: Payer: Self-pay | Admitting: Urology

## 2018-07-12 DIAGNOSIS — N289 Disorder of kidney and ureter, unspecified: Secondary | ICD-10-CM

## 2018-07-12 NOTE — Progress Notes (Signed)
BUN + creatinine order in.

## 2018-07-15 ENCOUNTER — Encounter: Payer: Self-pay | Admitting: Internal Medicine

## 2018-07-15 NOTE — Procedures (Signed)
[facility] [address] [place], [place] [address]  Patient Name: Shelby PilotCatherine Brown [name] DOB: 1941-11-26   SLEEP STUDY INTERPRETATION  DATE OF SERVICE: June 24, 2018   SLEEP STUDY HISTORY: This patient is referred to the sleep lab for a baseline Polysomnography with emergency intervention. Pertinent history includes a history of diagnosis of excessive daytime somnolence and snoring.  PROCEDURE: This overnight polysomnogram was performed using the Alice 5 acquisition system using the standard diagnostic protocol as outlined by the AASM. This includes 6 channels of EEG, 2 channelscannels of EOG, chin EMG, bilateral anterior tibialis EMG, nasal/oral thermister, PTAF, chest and abdominal wall movements, ECG and pulse oximetry. Apneas and Hypopneas were scored per AASM definition.  SLEEP ARCHITECHTURE: This is a baseline polysomnograph  study. The total recording time was 453.5 minutes and the patients total sleep time is noted to be 395 minutes. Sleep onset latency was 13 minutes and is normal.  Stage R sleep onset latency was 29 minutes. Sleep maintenance efficiency was 91.2 % and is normal.  Sleep staging expressed as a percentage of total sleep time demonstrated 5.1 % N1, 51.98 % N2 and 1.8 % N3  sleep. Stage R represents 41.3 % of total sleep time. This is increased.  There were a total of 59 arousals  for an overall arousal index of 9 per hour of sleep. PLMS arousal were not noted. Arousals without respiratory events are  noted. This can contribute to sleep architechture disruption.  RESPIRATORY MONITORING:   Patient exhibits significant evidence  of sleep disorderd breathing characterized by 0 central apneas, 33 obstructive apneas and 0 mixed apneas. There were 135 obstructive hypopneas and 0 RERAs. Most of the apneas/hypopneas were of obstructive variety. The total apnea hypopnea index (apneas and hypopneas per hour of sleep) is 25.5 respiratory events per hour and is  moderately severe.  Respiratory monitoring demonstrated mild snoring through the night. There are a total of 1322 snoring episodes representing 5.4 % of sleep.   Baseline oxygen saturation during wakefulness was 97 % and during NREM sleep averaged 95 % through the night.  Lowest arterial saturation during REM sleep was 88 % through the night. There there was significant  oxygen desaturation with the respiratory events. Arterial oxygen desaturation occurred of at least 4% was noted with a low saturation of 63 %. The study was performed off oxygen.  CARDIAC MONITORING:   Average heart rate is 70 during sleep with a high of 60 beats per minute. Malignant arrhythmias were not noted.  CPAP titration: Because of the severity of respiratory events as well as the oxygen desaturation patient was emergently started on CPAP as per the lab protocol.  Patient was started on a CPAP level of 5 and titrated up to a maximal pressure of 15.  The apnea hypotony index continued to improve up until a pressure of CPAP 12 cm water pressure.  Beyond this level the apnea hypotony index actually started to get a little bit worse.  On a pressure of 12 cmH2O pressure patient had no apneas 1 hypercapnia and no rigorous.  The total apnea hypotony index was 1.9/h.  As far as the oxygen saturations are concerned the lowest oxygen saturation was 92% on this pressure.  It would therefore appear that the patient should be started on a CPAP of 12 and followed up closely in the clinic  IMPRESSIONS:  --This overnight polysomnogram demonstrates severe obstructive sleep apnea with an overall AHI 25.5 per hour. --The overall AHI was  significantly worse during Stage R. --There were associated significant arterial oxygen desaturations noted down to 63% --There was no significant PLMS noted in this study. --There is mild snoring noted throughout the study.    RECOMMENDATIONS:  --CPAP titration study is adequate to control this  patient's obstructive sleep apnea.  The optimal pressure appears to be CPAP 12 cm water pressure.  On this pressure patient had adequate sleep pattern index of only 1.9/h and no significant desaturation was noted.. --Nasal decongestants and antihistamines may be of help for increased upper airways resistance when present. --Weight loss through dietary and lifestyle modification is recommended in the presence of obesity. --A search for and treatment of any underlying cardiopulmonary disease is      recommended in the presence of oxygen desaturations. --Alternative treatment options if the patient is not willing to use CPAP include oral   appliances as well as surgical intervention which may help in the appropriate patient. --Clinical correlation is recommended. Please feel free to call the office for any further  questions or assistance in the care of this patient.     Yevonne Pax, MD [facility] Pulmonary Critical Care Medicine Sleep medicine

## 2018-07-16 ENCOUNTER — Ambulatory Visit
Admission: RE | Admit: 2018-07-16 | Discharge: 2018-07-16 | Disposition: A | Discharge status: Home / Self Care | Payer: Medicare Other | Source: Ambulatory Visit | Attending: Urology | Admitting: Urology

## 2018-07-16 ENCOUNTER — Other Ambulatory Visit
Admission: RE | Admit: 2018-07-16 | Discharge: 2018-07-16 | Disposition: A | Discharge status: Home / Self Care | Payer: Medicare Other | Source: Ambulatory Visit | Attending: Urology | Admitting: Urology

## 2018-07-16 DIAGNOSIS — N281 Cyst of kidney, acquired: Secondary | ICD-10-CM | POA: Diagnosis present

## 2018-07-16 DIAGNOSIS — N289 Disorder of kidney and ureter, unspecified: Secondary | ICD-10-CM | POA: Diagnosis present

## 2018-07-16 DIAGNOSIS — N2889 Other specified disorders of kidney and ureter: Secondary | ICD-10-CM | POA: Insufficient documentation

## 2018-07-16 LAB — CREATININE, SERUM
CREATININE: 1.58 mg/dL — AB (ref 0.44–1.00)
GFR calc Af Amer: 36 mL/min — ABNORMAL LOW (ref >60)
GFR, EST NON AFRICAN AMERICAN: 31 mL/min — AB (ref >60)

## 2018-07-16 LAB — BUN: BUN: 34 mg/dL — ABNORMAL HIGH (ref 8–23)

## 2018-07-16 MED ORDER — GADOBENATE DIMEGLUMINE 529 MG/ML IV SOLN
10.0000 mL | Freq: Once | INTRAVENOUS | Status: AC | PRN
  Administered 2018-07-16: 10 mL via INTRAVENOUS

## 2018-07-19 ENCOUNTER — Ambulatory Visit
Admission: RE | Admit: 2018-07-19 | Discharge: 2018-07-19 | Disposition: A | Discharge status: Home / Self Care | Payer: Medicare Other | Source: Ambulatory Visit | Attending: Urology | Admitting: Urology

## 2018-07-19 DIAGNOSIS — E041 Nontoxic single thyroid nodule: Secondary | ICD-10-CM | POA: Insufficient documentation

## 2018-07-22 ENCOUNTER — Ambulatory Visit: Admitting: Urology

## 2018-07-27 DIAGNOSIS — I700 Atherosclerosis of aorta: Secondary | ICD-10-CM | POA: Insufficient documentation

## 2018-07-31 DIAGNOSIS — E042 Nontoxic multinodular goiter: Secondary | ICD-10-CM | POA: Insufficient documentation

## 2018-08-17 ENCOUNTER — Ambulatory Visit: Payer: Medicare Other | Admitting: Urology

## 2018-08-17 ENCOUNTER — Telehealth: Payer: Self-pay | Admitting: Urology

## 2018-08-17 NOTE — Telephone Encounter (Signed)
This patient was a no-show to clinic today to discuss management of a small renal mass.  She needs to be reschedule with me or Dr. Richardo Hanks in the near future.  [name], MD

## 2018-08-18 ENCOUNTER — Encounter: Payer: Self-pay | Admitting: Urology

## 2018-08-23 ENCOUNTER — Encounter: Payer: Self-pay | Admitting: Urology

## 2018-08-23 ENCOUNTER — Telehealth: Payer: Self-pay | Admitting: Urology

## 2018-08-23 NOTE — Telephone Encounter (Signed)
Certified letter sent on 08/23/2018

## 2018-09-01 NOTE — Progress Notes (Signed)
Patient ID: Shelby [name], female    DOB: [birth year], 77 y.o.   MRN: [id number]  HPI  Shelby [name] is a 77 y/o female with a history of DM, hyperlipidemia, HTN, anemia, obstructive sleep apnea, neuropathy and chronic heart failure.   Echo report from 02/13/15 reviewed and showed an EF of 35% along with moderate MR and mild PHTN. Function study on 03/16/15 showed an EF of 52%.   Has not been admitted or been in the ED in the last 6 months.    Shelby [name] presents today for a follow-up visit with a chief complaint of moderate fatigue upon minimal exertion. Shelby [name] describes this as chronic in nature having been present for several years. Shelby [name] has associated pedal edema, difficulty sleeping, neuropathy and slight weight gain along with this. Shelby [name] denies any abdominal distention, palpitations, chest pain, shortness of breath, cough or dizziness. Still hasn't heard anything regarding getting CPAP equipment from Shelby [name] sleep study which was done July 2019.  Past Medical History:  Diagnosis Date  . Anemia   . Cardiomyopathy (HCC)    Ejection Fraction 30-35% per ECHO 2016  . CHF (congestive heart failure) (HCC)   . Diabetes (HCC)   . Dysrhythmia   . Hyperlipidemia   . Hypertension   . Peripheral neuropathy   . Peripheral neuropathy   . Sleep apnea    Past Surgical History:  Procedure Laterality Date  . ABDOMINAL HYSTERECTOMY    . COLONOSCOPY    . COLONOSCOPY N/A 04/01/2018   Procedure: COLONOSCOPY;  Surgeon: Christena Deem, MD;  Location: [facility] ENDOSCOPY;  Service: Endoscopy;  Laterality: N/A;  . COLONOSCOPY WITH PROPOFOL N/A 12/29/2017   Procedure: COLONOSCOPY WITH PROPOFOL;  Surgeon: Christena Deem, MD;  Location: [facility] ENDOSCOPY;  Service: Endoscopy;  Laterality: N/A;  . ESOPHAGOGASTRODUODENOSCOPY (EGD) WITH PROPOFOL N/A 12/29/2017   Procedure: ESOPHAGOGASTRODUODENOSCOPY (EGD) WITH PROPOFOL;  Surgeon: Christena Deem, MD;  Location: [facility] ENDOSCOPY;  Service: Endoscopy;  Laterality: N/A;  .  ESOPHAGOGASTRODUODENOSCOPY (EGD) WITH PROPOFOL N/A 04/01/2018   Procedure: ESOPHAGOGASTRODUODENOSCOPY (EGD) WITH PROPOFOL;  Surgeon: Christena Deem, MD;  Location: [facility] ENDOSCOPY;  Service: Endoscopy;  Laterality: N/A;  . EUS N/A 05/20/2018   Procedure: ESOPHAGEAL ENDOSCOPIC ULTRASOUND (EUS) RADIAL;  Surgeon: Bearl Mulberry, MD;  Location: [facility] ENDOSCOPY;  Service: Gastroenterology;  Laterality: N/A;   Family History  Problem Relation Age of Onset  . Diabetes Mother   . Heart disease Mother   . Bladder Cancer Neg Hx   . Kidney cancer Neg Hx    Social History   Tobacco Use  . Smoking status: Never Smoker  . Smokeless tobacco: Never Used  Substance Use Topics  . Alcohol use: No   Allergies  Allergen Reactions  . Mirabegron Other (See Comments)    Elevated BP  . Furosemide Other (See Comments)    Legs swelling    Prior to Admission medications   Medication Sig Start Date End Date Taking? Authorizing Provider  amLODipine (NORVASC) 5 MG tablet Take 10 mg by mouth daily.  12/11/15  Yes [provider]  aspirin EC 81 MG tablet Take 81 mg by mouth daily.   Yes [provider]  atorvastatin (LIPITOR) 80 MG tablet Take 80 mg by mouth daily.  12/11/15  Yes [provider]  brimonidine (ALPHAGAN) 0.2 % ophthalmic solution Place 1 drop into both eyes 2 (two) times daily.    Yes [provider]  candesartan (ATACAND) 32 MG tablet Take by mouth daily.  07/15/16  Yes [provider]  carvedilol (COREG) 25 MG tablet Take by mouth 2 (two) times daily with a meal.  07/15/16  Yes [provider]  conjugated estrogens (PREMARIN) vaginal cream Place 1 Applicatorful vaginally daily. Apply 0.5mg  (pea-sized amount)  just inside the vaginal introitus with a finger-tip every night for two weeks and then Monday, Wednesday and Friday nights. 03/11/18  Yes McGowan, Carollee Herter A, PA-C  donepezil (ARICEPT) 10 MG tablet Take 10 mg by mouth at bedtime.   Yes  [provider]  ferrous sulfate 325 (65 FE) MG tablet Take by mouth. Takes one pill every 2-3 days to minimize constipation   Yes [provider]  fesoterodine (TOVIAZ) 8 MG TB24 tablet Take 1 tablet (8 mg total) by mouth daily. 03/11/18  Yes McGowan, Carollee Herter A, PA-C  magnesium oxide (MAG-OX) 400 MG tablet Take 400 mg by mouth 2 (two) times daily.   Yes [provider]  Multiple Vitamin (MULTI-VITAMINS) TABS Take 1 tablet by mouth daily.    Yes [provider]  pantoprazole (PROTONIX) 40 MG tablet Take 40 mg by mouth 2 (two) times daily.    Yes [provider]  travoprost, benzalkonium, (TRAVATAN) 0.004 % ophthalmic solution Place 1 drop into both eyes at bedtime.    Yes [provider]   Review of Systems  Constitutional: Positive for fatigue. Negative for appetite change.  HENT: Negative for congestion, postnasal drip and sore throat.   Eyes: Negative.   Respiratory: Negative for cough, chest tightness and shortness of breath.   Cardiovascular: Positive for leg swelling (minimal). Negative for chest pain and palpitations.  Gastrointestinal: Negative for abdominal distention and abdominal pain.  Endocrine: Negative.   Genitourinary: Negative.   Musculoskeletal: Positive for back pain (when walking long distances). Negative for arthralgias and neck pain.  Skin: Positive for rash (lower legs).       Itching lower legs  Allergic/Immunologic: Negative.   Neurological: Positive for numbness (tingling in feet due to neuropathy). Negative for dizziness and light-headedness.       Leg cramping  Hematological: Negative for adenopathy. Does not bruise/bleed easily.  Psychiatric/Behavioral: Positive for sleep disturbance (not sleeping well). Negative for dysphoric mood. The patient is not nervous/anxious.    Vitals:   09/02/18 0904  BP: 110/61  Pulse: 72  Resp: 18  SpO2: 95%  Weight: 226 lb 4 oz (102.6 kg)  Height: 5\' 4"  (1.626 m)   Wt  Readings from Last 3 Encounters:  09/02/18 226 lb 4 oz (102.6 kg)  07/06/18 222 lb 3.2 oz (100.8 kg)  06/03/18 222 lb 2 oz (100.8 kg)   Lab Results  Component Value Date   CREATININE 1.58 (H) 07/16/2018   CREATININE 1.65 (H) 03/18/2018   CREATININE 1.33 (H) 02/16/2018    Physical Exam  Constitutional: Shelby [name] is oriented to person, place, and time. Shelby [name] appears well-developed and well-nourished.  HENT:  Head: Normocephalic and atraumatic.  Neck: Normal range of motion. Neck supple. No JVD present.  Cardiovascular: Normal rate and regular rhythm.  Pulmonary/Chest: Effort normal. Shelby [name] has no wheezes. Shelby [name] has no rales.  Abdominal: Soft. Shelby [name] exhibits no distension. There is no tenderness.  Musculoskeletal: Shelby [name] exhibits edema (1+ pitting edema bilateral lower legs). Shelby [name] exhibits no tenderness.  Neurological: Shelby [name] is alert and oriented to person, place, and time.  Skin: Skin is warm and dry.  Psychiatric: Shelby [name] has a normal mood and affect. Shelby [name] behavior is normal. Thought content normal.  Nursing note and vitals reviewed.  Assessment & Plan:  1: Chronic heart failure with reduced ejection fraction- - NYHA class III - euvolemic today - weighing daily. Reminded to call for an overnight weight gain of >2 pounds or a weekly weight gain of >5 pounds - weight up 4 pounds since Shelby [name] was last here 3 months ago - trying to not add salt  - drinks ~32 ounces of water daily along with a a soda or tea - saw cardiology (Fath) 06/09/18  - riding stationary bike daily for 1 hour each day for the last 6 weeks - patient did not show for Shelby [name] 06/09/18 echo; will need to get it rescheduled. - will get BMP today due to leg cramps - PharmD reconciled medications with the patient - has not received Shelby [name] flu vaccine yet  2: HTN- - BP looks good today although on the low side - saw PCP Harrington Challenger([place]) 07/27/18 - BMP from 07/27/18 reviewed and showed sodium 145, potassium 4.7, creatinine 1.6 and GFR 38   3: Diabetes- -  fasting glucose has been "good" per Shelby [name] report - A1c on 07/27/18 was 6.5%  4: Obstructive sleep apnea- - last sleep study was July 2019 - will have office staff contact pulmonologist regarding orders of the equipment - patient says that Shelby [name] slept "great" when wearing the CPAP  5: Lymphedema- - stage 2 - encouraged Shelby [name] to elevate Shelby [name] legs when sitting for long periods of time - also encouraged Shelby [name] to get TED hose to wear daily - could consider lymphapress compression boots if edema persists  Patient did not bring Shelby [name] medications nor a list. Each medication was verbally reviewed with the patient and Shelby [name] was encouraged to bring the bottles to every visit to confirm accuracy of list.  Return in 3 months or sooner for any questions/problems before then.

## 2018-09-02 ENCOUNTER — Encounter: Payer: Self-pay | Admitting: Family

## 2018-09-02 ENCOUNTER — Ambulatory Visit: Payer: Medicare Other | Attending: Family | Admitting: Family

## 2018-09-02 VITALS — BP 110/61 | HR 72 | Resp 18 | Ht 64.0 in | Wt 226.2 lb

## 2018-09-02 DIAGNOSIS — Z7982 Long term (current) use of aspirin: Secondary | ICD-10-CM | POA: Insufficient documentation

## 2018-09-02 DIAGNOSIS — I890 Lymphedema, not elsewhere classified: Secondary | ICD-10-CM | POA: Insufficient documentation

## 2018-09-02 DIAGNOSIS — I10 Essential (primary) hypertension: Secondary | ICD-10-CM

## 2018-09-02 DIAGNOSIS — I110 Hypertensive heart disease with heart failure: Secondary | ICD-10-CM | POA: Insufficient documentation

## 2018-09-02 DIAGNOSIS — I5022 Chronic systolic (congestive) heart failure: Secondary | ICD-10-CM | POA: Insufficient documentation

## 2018-09-02 DIAGNOSIS — E785 Hyperlipidemia, unspecified: Secondary | ICD-10-CM | POA: Insufficient documentation

## 2018-09-02 DIAGNOSIS — Z888 Allergy status to other drugs, medicaments and biological substances status: Secondary | ICD-10-CM | POA: Diagnosis not present

## 2018-09-02 DIAGNOSIS — E1140 Type 2 diabetes mellitus with diabetic neuropathy, unspecified: Secondary | ICD-10-CM | POA: Insufficient documentation

## 2018-09-02 DIAGNOSIS — G4733 Obstructive sleep apnea (adult) (pediatric): Secondary | ICD-10-CM | POA: Diagnosis not present

## 2018-09-02 DIAGNOSIS — I272 Pulmonary hypertension, unspecified: Secondary | ICD-10-CM | POA: Diagnosis not present

## 2018-09-02 DIAGNOSIS — Z79899 Other long term (current) drug therapy: Secondary | ICD-10-CM | POA: Diagnosis not present

## 2018-09-02 LAB — BASIC METABOLIC PANEL
Anion gap: 5 (ref 5–15)
BUN: 30 mg/dL — ABNORMAL HIGH (ref 8–23)
CHLORIDE: 104 mmol/L (ref 98–111)
CO2: 32 mmol/L (ref 22–32)
CREATININE: 1.54 mg/dL — AB (ref 0.44–1.00)
Calcium: 10 mg/dL (ref 8.9–10.3)
GFR, EST AFRICAN AMERICAN: 37 mL/min — AB (ref >60)
GFR, EST NON AFRICAN AMERICAN: 32 mL/min — AB (ref >60)
Glucose, Bld: 82 mg/dL (ref 70–99)
Potassium: 4.6 mmol/L (ref 3.5–5.1)
SODIUM: 141 mmol/L (ref 135–145)

## 2018-09-02 NOTE — Patient Instructions (Signed)
Continue weighing daily and call for an overnight weight gain of > 2 pounds or a weekly weight gain of >5 pounds. 

## 2018-09-03 ENCOUNTER — Encounter: Payer: Self-pay | Admitting: Family

## 2018-09-03 ENCOUNTER — Ambulatory Visit: Payer: Medicare Other | Admitting: Urology

## 2018-09-03 DIAGNOSIS — I890 Lymphedema, not elsewhere classified: Secondary | ICD-10-CM | POA: Insufficient documentation

## 2018-09-14 ENCOUNTER — Ambulatory Visit: Payer: Medicare Other | Admitting: Urology

## 2018-09-14 ENCOUNTER — Encounter: Payer: Self-pay | Admitting: Urology

## 2018-09-14 ENCOUNTER — Ambulatory Visit (INDEPENDENT_AMBULATORY_CARE_PROVIDER_SITE_OTHER): Payer: Medicare Other | Admitting: Urology

## 2018-09-14 VITALS — BP 125/76 | HR 78 | Ht 64.0 in | Wt 229.0 lb

## 2018-09-14 DIAGNOSIS — N2889 Other specified disorders of kidney and ureter: Secondary | ICD-10-CM

## 2018-09-14 NOTE — Progress Notes (Signed)
09/14/2018 3:53 PM   Shelby [name] [birth year] [id number]  Referring provider: Mickey Farber, MD [address] [facility] [place], [place] [address]  Chief Complaint  Patient presents with  . Results    HPI: 77 year old female who presents today for further evaluation of right renal mass.  She was initially seen and evaluated for right ureteral calculus which she subsequently passed.  On this imaging, she did have multiple indeterminate renal lesions.  She underwent further evaluation in the form of MRI of the abdomen with and without contrast for further characterization.  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  No lymphadenopathy or vein involvement.  She denies any flank pain or gross hematuria.  She does have multiple medical comorbidities including history of diabetes, hypertension, cardiomyopathy with an ejection fraction of 35%, morbid obesity amongst others.   PMH: Past Medical History:  Diagnosis Date  . Anemia   . Cardiomyopathy (HCC)    Ejection Fraction 30-35% per ECHO 2016  . CHF (congestive heart failure) (HCC)   . Diabetes (HCC)   . Dysrhythmia   . Hyperlipidemia   . Hypertension   . Peripheral neuropathy   . Peripheral neuropathy   . Sleep apnea     Surgical History: Past Surgical History:  Procedure Laterality Date  . ABDOMINAL HYSTERECTOMY    . COLONOSCOPY    . COLONOSCOPY N/A 04/01/2018   Procedure: COLONOSCOPY;  Surgeon: Christena Deem, MD;  Location: [facility] ENDOSCOPY;  Service: Endoscopy;  Laterality: N/A;  . COLONOSCOPY WITH PROPOFOL N/A 12/29/2017   Procedure: COLONOSCOPY WITH PROPOFOL;  Surgeon: Christena Deem, MD;  Location: [facility] ENDOSCOPY;  Service: Endoscopy;  Laterality: N/A;  . ESOPHAGOGASTRODUODENOSCOPY  (EGD) WITH PROPOFOL N/A 12/29/2017   Procedure: ESOPHAGOGASTRODUODENOSCOPY (EGD) WITH PROPOFOL;  Surgeon: Christena Deem, MD;  Location: [facility] ENDOSCOPY;  Service: Endoscopy;  Laterality: N/A;  . ESOPHAGOGASTRODUODENOSCOPY (EGD) WITH PROPOFOL N/A 04/01/2018   Procedure: ESOPHAGOGASTRODUODENOSCOPY (EGD) WITH PROPOFOL;  Surgeon: Christena Deem, MD;  Location: [facility] ENDOSCOPY;  Service: Endoscopy;  Laterality: N/A;  . EUS N/A 05/20/2018   Procedure: ESOPHAGEAL ENDOSCOPIC ULTRASOUND (EUS) RADIAL;  Surgeon: Bearl Mulberry, MD;  Location: [facility] ENDOSCOPY;  Service: Gastroenterology;  Laterality: N/A;    Home Medications:  Allergies as of 09/14/2018      Reactions   Mirabegron Other (See Comments)   Elevated BP   Furosemide Other (See Comments)   Legs swelling      Medication List        Accurate as of 09/14/18  3:53 PM. Always use your most recent med list.          amLODipine 5 MG tablet Commonly known as:  NORVASC Take 10 mg by mouth daily.   aspirin EC 81 MG tablet Take 81 mg by mouth daily.   atorvastatin 80 MG tablet Commonly known as:  LIPITOR Take 80 mg by mouth daily.   brimonidine 0.2 % ophthalmic solution Commonly known as:  ALPHAGAN Place 1 drop into both eyes 2 (two) times daily.   candesartan 32 MG tablet Commonly known as:  ATACAND Take by mouth daily.   carvedilol 25 MG tablet Commonly known as:  COREG Take by mouth 2 (two) times daily with a meal.   conjugated estrogens vaginal cream Commonly known as:  PREMARIN Place 1 Applicatorful  vaginally daily. Apply 0.5mg  (pea-sized amount)  just inside the vaginal introitus with a finger-tip every night for two weeks and then Monday, Wednesday and Friday nights.   donepezil 10 MG tablet Commonly known as:  ARICEPT Take 10 mg by mouth at bedtime.   ferrous sulfate 325 (65 FE) MG tablet Take by mouth. Takes one pill every 2-3 days to minimize constipation   fesoterodine 8 MG Tb24 tablet Commonly known  as:  TOVIAZ Take 1 tablet (8 mg total) by mouth daily.   magnesium oxide 400 MG tablet Commonly known as:  MAG-OX Take 400 mg by mouth 2 (two) times daily.   MULTI-VITAMINS Tabs Take 1 tablet by mouth daily.   pantoprazole 40 MG tablet Commonly known as:  PROTONIX Take 40 mg by mouth 2 (two) times daily.   travoprost (benzalkonium) 0.004 % ophthalmic solution Commonly known as:  TRAVATAN Place 1 drop into both eyes at bedtime.       Allergies:  Allergies  Allergen Reactions  . Mirabegron Other (See Comments)    Elevated BP  . Furosemide Other (See Comments)    Legs swelling     Family History: Family History  Problem Relation Age of Onset  . Diabetes Mother   . Heart disease Mother   . Bladder Cancer Neg Hx   . Kidney cancer Neg Hx     Social History:  reports that she has never smoked. She has never used smokeless tobacco. She reports that she does not drink alcohol or use drugs.  ROS: UROLOGY Frequent Urination?: Yes Hard to postpone urination?: No Burning/pain with urination?: No Get up at night to urinate?: Yes Leakage of urine?: Yes Urine stream starts and stops?: No Trouble starting stream?: No Do you have to strain to urinate?: No Blood in urine?: No Urinary tract infection?: No Sexually transmitted disease?: No Injury to kidneys or bladder?: No Painful intercourse?: No Weak stream?: No Currently pregnant?: No Vaginal bleeding?: No Last menstrual period?: Hysterectomy  Gastrointestinal Nausea?: No Vomiting?: No Indigestion/heartburn?: No Diarrhea?: No Constipation?: No  Constitutional Fever: No Night sweats?: Yes Weight loss?: No Fatigue?: No  Skin Skin rash/lesions?: Yes Itching?: Yes  Eyes Blurred vision?: No Double vision?: No  Ears/Nose/Throat Sore throat?: No Sinus problems?: Yes  Hematologic/Lymphatic Swollen glands?: No Easy bruising?: No  Cardiovascular Leg swelling?: Yes Chest pain?:  No  Respiratory Cough?: No Shortness of breath?: Yes  Endocrine Excessive thirst?: No  Musculoskeletal Back pain?: Yes Joint pain?: No  Neurological Headaches?: No Dizziness?: No  Psychologic Depression?: No Anxiety?: No  Physical Exam: BP 125/76 (BP Location: Left Arm, Patient Position: Sitting, Cuff Size: Large)   Pulse 78   Ht 5\' 4"  (1.626 m)   Wt 229 lb (103.9 kg)   BMI 39.31 kg/m   Constitutional:  Alert and oriented, No acute distress. HEENT: [place] AT, moist mucus membranes.  Trachea midline, no masses. Cardiovascular: No clubbing, cyanosis, or edema. Respiratory: Normal respiratory effort, no increased work of breathing. GI: Abdomen is soft, nontender, nondistended, no abdominal masses, obese. Skin: No rashes, bruises or suspicious lesions. Neurologic: Grossly intact, no focal deficits, moving all 4 extremities. Psychiatric: Normal mood and affect.  Laboratory Data: Lab Results  Component Value Date   WBC 7.8 01/23/2018   HGB 12.9 01/23/2018   HCT 39.7 01/23/2018   MCV 90.8 01/23/2018   PLT 192 01/23/2018    Lab Results  Component Value Date   CREATININE 1.54 (H) 09/02/2018    Urinalysis    Component  Value Date/Time   COLORURINE YELLOW (A) 01/23/2018 0738   APPEARANCEUR Clear 02/16/2018 0910   LABSPEC 1.017 01/23/2018 0738   PHURINE 5.0 01/23/2018 0738   GLUCOSEU Negative 02/16/2018 0910   HGBUR LARGE (A) 01/23/2018 0738   BILIRUBINUR Negative 02/16/2018 0910   KETONESUR NEGATIVE 01/23/2018 0738   PROTEINUR Negative 02/16/2018 0910   PROTEINUR 100 (A) 01/23/2018 0738   NITRITE Negative 02/16/2018 0910   NITRITE NEGATIVE 01/23/2018 0738   LEUKOCYTESUR Negative 02/16/2018 0910    Lab Results  Component Value Date   LABMICR See below: 02/16/2018   WBCUA None seen 02/16/2018   RBCUA None seen 02/16/2018   LABEPIT 0-10 02/16/2018   BACTERIA Few (A) 02/16/2018    Pertinent Imaging: CLINICAL DATA:  Acquired kidney cyst.   Follow-up.  EXAM: MRI ABDOMEN WITHOUT AND WITH CONTRAST  TECHNIQUE: Multiplanar multisequence MR imaging of the abdomen was performed both before and after the administration of intravenous contrast.  CONTRAST:  10mL MULTIHANCE GADOBENATE DIMEGLUMINE 529 MG/ML IV SOLN  COMPARISON:  CT AP 01/23/2018  FINDINGS: Lower chest: No acute findings.  Hepatobiliary: No focal liver abnormality identified. Within segment 4a there is an enhancing structure measuring 1.6 cm, image 44/18. this is T2 hyperintense, image 15/2 and T1 hypointense. Persistent and progressive enhancement of this structure is noted, favoring a benign hemangioma. No suspicious enhancing liver abnormalities identified. The gallbladder is normal. No biliary dilatation.  Pancreas: No mass, inflammatory changes, or other parenchymal abnormality identified.  Spleen:  Within normal limits in size and appearance.  Adrenals/Urinary Tract: There are bilateral kidney cysts of varying complexity. Most of these are compatible with Bosniak category 1 and 2 lesions. Arising from the lateral cortex of the right kidney is a partially exophytic structure measuring 11 mm. On the subtraction images there is evidence of internal enhancement within this structure, image 43/13. Suspicious for a small renal neoplasm. No additional enhancing kidney lesions identified.  Stomach/Bowel: Visualized portions within the abdomen are unremarkable.  Vascular/Lymphatic: Normal appearance of the abdominal aorta. No enlarged lymph nodes within the upper abdomen.  Other:  No free fluid or fluid collections identified.  Musculoskeletal: No suspicious bone lesions.  IMPRESSION: 1. 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 2. Additional bilateral kidney lesions compatible with Bosniak category 1 and 2  cysts. 3. Enhancing structure within segment 4 a of the liver compatible with benign hemangioma.   Electronically Signed   By: Signa Kell M.D.   On: 07/16/2018 16:02  MRI of the abdomen was personally reviewed today and with the patient.  Assessment & Plan:    1. Right renal mass Of 1 mm right posterior lateral enhancing renal lesion suspicious for renal cell carcinoma, incidental and otherwise asymptomatic  A solid renal mass raises the suspicion of primary renal malignancy.  We discussed this in detail and in regards to the spectrum of renal masses which includes cysts (pure cysts are considered benign), solid masses and everything in between. The risk of metastasis increases as the size of solid renal mass increases. In general, it is believed that the risk of metastasis for renal masses less than 3-4 cm is small (up to approximately 5%) based mainly on large retrospective studies. In some cases and especially in patients of older age and multiple comorbidities a surveillance approach may be appropriate. The treatment of solid renal masses includes: surveillance, cryoablation (percutaneous and  laparoscopic) in addition to partial and complete nephrectomy (each with option of laparoscopic, robotic and open depending on appropriateness). Furthermore, nephrectomy appears to be an independent risk factor for the development of chronic kidney disease suggesting that nephron sparing approaches should be implored whenever feasible. We reviewed these options in context of the patients current situation as well as the pros and cons of each.  For cystic renal masses, we reviewed the Bosniak classification and discussed that Bosniak 3 lesions harbor a 50% chance of malignancy whereas Bosniak 4 cysts have a solid and 90-95% are malignant in nature.   Given her age and comorbidities, I either strongly recommend surveillance versus cryotherapy.  We discussed the risk and benefits of each.  We  discussed that for the small renal lesions, surveillance is an excellent option for a person with multiple medical issues.  She is interested in surveillance.  We will plan for MRI for direct comparison in about 6 months as this was not previously seen on ultrasound.  If lesion is stable, will increase interval of surveillance.  If the lesion enlarges, she may consider biopsy with cryotherapy.  - MR Abdomen W Wo Contrast; Future   Return in about 6 months (around 03/16/2019) for MRI.  [name], MD  [facility] [address] [place], [place] [address] [phone]  I spent 25 min with this patient of which greater than 50% was spent in counseling and coordination of care with the patient.

## 2018-09-17 ENCOUNTER — Ambulatory Visit: Payer: Medicare Other | Admitting: Urology

## 2018-10-08 ENCOUNTER — Encounter: Payer: Self-pay | Admitting: Adult Health

## 2018-10-08 ENCOUNTER — Ambulatory Visit (INDEPENDENT_AMBULATORY_CARE_PROVIDER_SITE_OTHER): Payer: Medicare Other | Admitting: Adult Health

## 2018-10-08 VITALS — BP 140/72 | HR 75 | Resp 18 | Ht 64.0 in | Wt 226.0 lb

## 2018-10-08 DIAGNOSIS — G4733 Obstructive sleep apnea (adult) (pediatric): Secondary | ICD-10-CM

## 2018-10-08 DIAGNOSIS — I10 Essential (primary) hypertension: Secondary | ICD-10-CM

## 2018-10-08 DIAGNOSIS — Z6838 Body mass index (BMI) 38.0-38.9, adult: Secondary | ICD-10-CM

## 2018-10-08 DIAGNOSIS — I429 Cardiomyopathy, unspecified: Secondary | ICD-10-CM | POA: Diagnosis not present

## 2018-10-08 DIAGNOSIS — I5022 Chronic systolic (congestive) heart failure: Secondary | ICD-10-CM | POA: Diagnosis not present

## 2018-10-08 NOTE — Patient Instructions (Signed)

## 2018-10-08 NOTE — Progress Notes (Signed)
[facility], [place] [address]  Pulmonary Sleep Medicine   Office Visit Note  Patient Name: Shelby [name] DOB: Apr 29, 1941 MRN [id number]  Date of Service: 10/08/2018  Complaints/HPI: Patient is here for follow-up on sleep study from July.  The patient's sleep study shows severe obstructive sleep apnea with an overall AHI of 25.5/h.  It also demonstrated significant arterial oxygen desaturations down to 63%.  There was no significant PLMS noted in this study, and mild snoring was noted throughout the study.  Patient reports she slept better than she slept in a long time while having this study.  She reports that she continues to have fatigue, and excessive daytime sleepiness.  She reports that she wakes up multiple times in the middle of the night due to vivid dreams and she is often coughing or having to catch her breath when she wakes up.  She reports that she feels like she can fall asleep anytime she sits down.  Based on this study the patient would benefit from CPAP.  ROS  General: (-) fever, (-) chills, (-) night sweats, (-) weakness Skin: (-) rashes, (-) itching,. Eyes: (-) visual changes, (-) redness, (-) itching. Nose and Sinuses: (-) nasal stuffiness or itchiness, (-) postnasal drip, (-) nosebleeds, (-) sinus trouble. Mouth and Throat: (-) sore throat, (-) hoarseness. Neck: (-) swollen glands, (-) enlarged thyroid, (-) neck pain. Respiratory: - cough, (-) bloody sputum, - shortness of breath, - wheezing. Cardiovascular: - ankle swelling, (-) chest pain. Lymphatic: (-) lymph node enlargement. Neurologic: (-) numbness, (-) tingling. Psychiatric: (-) anxiety, (-) depression   Current Medication: Outpatient Encounter Medications as of 10/08/2018  Medication Sig  . amLODipine (NORVASC) 5 MG tablet Take 10 mg by mouth daily.   Marland Kitchen aspirin EC 81 MG tablet Take 81 mg by mouth daily.  Marland Kitchen atorvastatin (LIPITOR) 80 MG tablet Take 80 mg by mouth  daily.   . brimonidine (ALPHAGAN) 0.2 % ophthalmic solution Place 1 drop into both eyes 2 (two) times daily.   . candesartan (ATACAND) 32 MG tablet Take by mouth daily.   . carvedilol (COREG) 25 MG tablet Take by mouth 2 (two) times daily with a meal.   . conjugated estrogens (PREMARIN) vaginal cream Place 1 Applicatorful vaginally daily. Apply 0.3m (pea-sized amount)  just inside the vaginal introitus with a finger-tip every night for two weeks and then Monday, Wednesday and Friday nights.  . donepezil (ARICEPT) 10 MG tablet Take 10 mg by mouth at bedtime.  . ferrous sulfate 325 (65 FE) MG tablet Take by mouth. Takes one pill every 2-3 days to minimize constipation  . fesoterodine (TOVIAZ) 8 MG TB24 tablet Take 1 tablet (8 mg total) by mouth daily.  . magnesium oxide (MAG-OX) 400 MG tablet Take 400 mg by mouth 2 (two) times daily.  . Multiple Vitamin (MULTI-VITAMINS) TABS Take 1 tablet by mouth daily.   . pantoprazole (PROTONIX) 40 MG tablet Take 40 mg by mouth 2 (two) times daily.   . travoprost, benzalkonium, (TRAVATAN) 0.004 % ophthalmic solution Place 1 drop into both eyes at bedtime.    No facility-administered encounter medications on file as of 10/08/2018.     Surgical History: Past Surgical History:  Procedure Laterality Date  . ABDOMINAL HYSTERECTOMY    . COLONOSCOPY    . COLONOSCOPY N/A 04/01/2018   Procedure: COLONOSCOPY;  Surgeon: SLollie Sails MD;  [facility]  Service: Endoscopy;  Laterality: N/A;  . COLONOSCOPY WITH PROPOFOL N/A 12/29/2017  Procedure: COLONOSCOPY WITH PROPOFOL;  Surgeon: Lollie Sails, MD;  Location: [facility] ENDOSCOPY;  Service: Endoscopy;  Laterality: N/A;  . ESOPHAGOGASTRODUODENOSCOPY (EGD) WITH PROPOFOL N/A 12/29/2017   Procedure: ESOPHAGOGASTRODUODENOSCOPY (EGD) WITH PROPOFOL;  Surgeon: Lollie Sails, MD;  Location: [facility] ENDOSCOPY;  Service: Endoscopy;  Laterality: N/A;  . ESOPHAGOGASTRODUODENOSCOPY (EGD) WITH PROPOFOL N/A  04/01/2018   Procedure: ESOPHAGOGASTRODUODENOSCOPY (EGD) WITH PROPOFOL;  Surgeon: Lollie Sails, MD;  Location: [facility] ENDOSCOPY;  Service: Endoscopy;  Laterality: N/A;  . EUS N/A 05/20/2018   Procedure: ESOPHAGEAL ENDOSCOPIC ULTRASOUND (EUS) RADIAL;  Surgeon: Holly Bodily, MD;  Location: [facility] ENDOSCOPY;  Service: Gastroenterology;  Laterality: N/A;    Medical History: Past Medical History:  Diagnosis Date  . Anemia   . Cardiomyopathy ([place])    Ejection Fraction 30-35% per ECHO 2016  . CHF (congestive heart failure) ([place])   . Diabetes ([place])   . Dysrhythmia   . Hyperlipidemia   . Hypertension   . Peripheral neuropathy   . Peripheral neuropathy   . Sleep apnea     Family History: Family History  Problem Relation Age of Onset  . Diabetes Mother   . Heart disease Mother   . Bladder Cancer Neg Hx   . Kidney cancer Neg Hx     Social History: Social History   Socioeconomic History  . Marital status: Widowed    Spouse name: Not on file  . Number of children: Not on file  . Years of education: Not on file  . Highest education level: Not on file  Occupational History  . Not on file  Social Needs  . Financial resource strain: Not very hard  . Food insecurity:    Worry: Never true    Inability: Never true  . Transportation needs:    Medical: No    Non-medical: No  Tobacco Use  . Smoking status: Never Smoker  . Smokeless tobacco: Never Used  Substance and Sexual Activity  . Alcohol use: No  . Drug use: No  . Sexual activity: Yes    Birth control/protection: Post-menopausal  Lifestyle  . Physical activity:    Days per week: 0 days    Minutes per session: 0 min  . Stress: Not at all  Relationships  . Social connections:    Talks on phone: More than three times a week    Gets together: Once a week    Attends religious service: More than 4 times per year    Active member of club or organization: Yes    Attends meetings of clubs or organizations: More than 4  times per year    Relationship status: Widowed  . Intimate partner violence:    Fear of current or ex partner: Patient refused    Emotionally abused: Patient refused    Physically abused: Patient refused    Forced sexual activity: Patient refused  Other Topics Concern  . Not on file  Social History Narrative  . Not on file    Vital Signs: Blood pressure 140/72, pulse 75, resp. rate 18, height _0  (1.626 m), weight 226 lb (102.5 kg), SpO2 97 %.  Examination: General Appearance: The patient is well-developed, well-nourished, and in no distress. Skin: Gross inspection of skin unremarkable. Head: normocephalic, no gross deformities. Eyes: no gross deformities noted. ENT: ears appear grossly normal no exudates. Neck: Supple. No thyromegaly. No LAD. Respiratory: Clear to auscultation bilateraly. Cardiovascular: Normal S1 and S2 without murmur or rub. Extremities: No cyanosis. pulses are equal. Neurologic: Alert  and oriented. No involuntary movements.  LABS: Recent Results (from the past 2160 hour(s))  BUN     Status: Abnormal   Collection Time: 07/16/18 11:17 AM  Result Value Ref Range   BUN 34 (H) 8 - 23 mg/dL    Comment: Performed at [facility] [address] [place], [place] [address]  Creatinine, serum     Status: Abnormal   Collection Time: 07/16/18 11:17 AM  Result Value Ref Range   Creatinine, Ser 1.58 (H) 0.44 - 1.00 mg/dL   GFR calc non Af Amer 31 (L) >60 mL/min   GFR calc Af Amer 36 (L) >60 mL/min    Comment: (NOTE) The eGFR has been calculated using the CKD EPI equation. This calculation has not been validated in all clinical situations. eGFR's persistently <60 mL/min signify possible Chronic Kidney Disease. Performed at [facility], [address] [place], [place] [address]   Basic metabolic panel     Status: Abnormal   Collection Time: 09/02/18  9:54 AM  Result Value Ref Range   Sodium 141 135 - 145 mmol/L   Potassium 4.6  3.5 - 5.1 mmol/L   Chloride 104 98 - 111 mmol/L   CO2 32 22 - 32 mmol/L   Glucose, Bld 82 70 - 99 mg/dL   BUN 30 (H) 8 - 23 mg/dL   Creatinine, Ser 1.54 (H) 0.44 - 1.00 mg/dL   Calcium 10.0 8.9 - 10.3 mg/dL   GFR calc non Af Amer 32 (L) >60 mL/min   GFR calc Af Amer 37 (L) >60 mL/min    Comment: (NOTE) The eGFR has been calculated using the CKD EPI equation. This calculation has not been validated in all clinical situations. eGFR's persistently <60 mL/min signify possible Chronic Kidney Disease.    Anion gap 5 5 - 15    Comment: Performed at [facility], [address] [place], [place] [address]    Radiology: US Thyroid  Result Date: 07/19/2018 CLINICAL DATA:  Right nodule seen on CT chest EXAM: THYROID ULTRASOUND TECHNIQUE: Ultrasound examination of the thyroid gland and adjacent soft tissues was performed. COMPARISON:  CT 07/05/2013 FINDINGS: Parenchymal Echotexture: Mildly heterogenous Isthmus: 0.3 cm thickness Right lobe: 3.8 x 2 x 2.2 cm Left lobe: 4.2 x 1.2 x 1.6 cm _________________________________________________________ Estimated total number of nodules >/= 1 cm: 3 Number of spongiform nodules >/=  2 cm not described below (TR1): 0 Number of mixed cystic and solid nodules >/= 1.5 cm not described below ([place]): 0 _________________________________________________________ Nodule # 1: Location: Right; Superior Maximum size: 1.9 cm; Other 2 dimensions: 1.7 x 1.5 cm Composition: solid/almost completely solid (2) Echogenicity: isoechoic (1) Shape: not taller-than-wide (0) Margins: smooth (0) Echogenic foci: none (0) ACR TI-RADS total points: 3. ACR TI-RADS risk category: TR3 (3 points). ACR TI-RADS recommendations: *Given size (>/= 1.5 - 2.4 cm) and appearance, a follow-up ultrasound in 1 year should be considered based on TI-RADS criteria. _________________________________________________________ Nodule # 2: Location: Right; Inferior Maximum size: 1.7 cm; Other 2 dimensions: 1.2 x  1.4 cm Composition: solid/almost completely solid (2) Echogenicity: isoechoic (1) Shape: not taller-than-wide (0) Margins: smooth (0) Echogenic foci: none (0) ACR TI-RADS total points: 3. ACR TI-RADS risk category: TR3 (3 points). ACR TI-RADS recommendations: *Given size (>/= 1.5 - 2.4 cm) and appearance, a follow-up ultrasound in 1 year should be considered based on TI-RADS criteria. _________________________________________________________ Nodule # 3: Location: Right; Mid posterior Maximum size: 1 cm; Other 2 dimensions: 0.9 x  1 cm Composition: solid/almost completely solid (2) Echogenicity: hypoechoic (2) Shape: not taller-than-wide (0) Margins: smooth (0) Echogenic foci: peripheral calcifications (2) ACR TI-RADS total points: 6. ACR TI-RADS risk category: TR4 (4-6 points). ACR TI-RADS recommendations: *Given size (>/= 1 - 1.4 cm) and appearance, a follow-up ultrasound in 1 year should be considered based on TI-RADS criteria. _________________________________________________________ 0.7 cm hypoechoic nodule without calcifications, inferior left IMPRESSION: 1. Normal-sized thyroid with multiple nodules as above. None meets criteria for biopsy. 2. Recommend annual/biennial ultrasound follow-up of right lesions as above, until stability x5 years confirmed. The above is in keeping with the ACR TI-RADS recommendations - J Am Coll Radiol 2017;14:587-595. Electronically Signed   By: Lucrezia Europe M.D.   On: 07/19/2018 12:05    No results found.  No results found.    Assessment and Plan: Patient Active Problem List   Diagnosis Date Noted  . Lymphedema 09/03/2018  . Chronic systolic heart failure ([place]) 03/08/2018  . Encounter for dental exam and cleaning w/o abnormal findings 01/14/2018  . High risk medication use 12/11/2015  . Mass of right elbow 10/04/2015  . Left lumbar radiculopathy 12/05/2014  . Anemia 06/12/2014  . Cardiomyopathy ([place]) 06/12/2014  . Benign essential hypertension 06/12/2014  .  Hyperlipidemia 06/12/2014  . Obstructive sleep apnea 06/12/2014  . Peripheral neuropathy 06/12/2014  . Proteinuria 06/12/2014  . 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[place]) 19/41/7408  . 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 Vitamin B 12 deficiency 06/12/2014  . Vitamin D deficiency 06/12/2014   1. OSA (obstructive sleep apnea) CPAP ordered based on titration study, patient's optimal pressure should be 12 cm of water. - For home use only DME continuous positive airway pressure (CPAP)  2. Benign essential hypertension Patient blood pressure appears well controlled at the time.  Encouraged her to continue current medications, and follow-up with PCP as scheduled.  3. Cardiomyopathy, unspecified type ([place]) Stable. Patient is followed by cardiologist Dr. Ubaldo Glassing, she reports she sees him every 6 months and I encouraged her to continue this.  4. Chronic systolic heart failure (H[facility]) Stable.  Continue to be followed by Dr. Ubaldo Glassing.  5. Class 2 severe obesity due to excess calories with serious comorbidity and body mass index (BMI) of 38.0 to 38.9 in adult [facility]) Obesity Counseling: Risk Assessment: An assessment of behavioral risk factors was made today and includes lack of exercise sedentary lifestyle, lack of portion control and poor dietary habits.  Risk Modification Advice: She was counseled on portion control guidelines. Restricting daily caloric intake to. . The detrimental long term effects of obesity on her health and ongoing poor compliance was also discussed with the patient.     General Counseling: I have discussed the findings of the evaluation and examination with Barnetta Chapel.  I have also discussed any further diagnostic evaluation thatmay be needed or ordered today. Ne[place] verbalizes understanding of the findings of todays visit. We also reviewed her medications today and discussed drug interactions and side effects including  but not limited excessive drowsiness and altered mental states. We also discussed that there is always a risk not just to her but also people around her. she has been encouraged to call the office with any questions or concerns that should arise related to todays visit.    Time spent: 25 This patient was seen by Orson Gear AGNP-C in Collaboration with Dr. Devona Konig as a part of collaborative care agreement.   I have personally obtained  a history, examined the patient, evaluated laboratory and imaging results, formulated the assessment and plan and placed orders.    Allyne Gee, MD [facility] Pulmonary and Critical Care Sleep medicine

## 2018-10-26 ENCOUNTER — Encounter: Payer: Self-pay | Admitting: Internal Medicine

## 2018-11-03 ENCOUNTER — Ambulatory Visit (INDEPENDENT_AMBULATORY_CARE_PROVIDER_SITE_OTHER): Payer: Medicare Other

## 2018-11-03 DIAGNOSIS — G4733 Obstructive sleep apnea (adult) (pediatric): Secondary | ICD-10-CM

## 2018-11-03 NOTE — Progress Notes (Signed)
New Cpap Setup   [name] was setup on resmed airsense S-10 at [employer] cmH2o with a resmed F-20 full face mask size med. She had a good understanding. We went over using, cleaning and compliance of cpap. Her son was with her and also had good understanding neither her nor her son had any questions at this time. She will follow up with me in 4 weeks and with Adam/Dr. Welton FlakesKhan in 6 weeks

## 2018-11-08 ENCOUNTER — Ambulatory Visit: Payer: Self-pay | Admitting: Adult Health

## 2018-11-09 ENCOUNTER — Ambulatory Visit: Payer: Self-pay | Admitting: Adult Health

## 2018-11-19 ENCOUNTER — Emergency Department: Payer: Medicare Other

## 2018-11-19 ENCOUNTER — Emergency Department
Admission: EM | Admit: 2018-11-19 | Discharge: 2018-11-19 | Disposition: A | Discharge status: Home / Self Care | Payer: Medicare Other | Attending: Emergency Medicine | Admitting: Emergency Medicine

## 2018-11-19 ENCOUNTER — Emergency Department (HOSPITAL_COMMUNITY): Payer: Medicare Other

## 2018-11-19 DIAGNOSIS — E1129 Type 2 diabetes mellitus with other diabetic kidney complication: Secondary | ICD-10-CM | POA: Diagnosis not present

## 2018-11-19 DIAGNOSIS — R2243 Localized swelling, mass and lump, lower limb, bilateral: Secondary | ICD-10-CM | POA: Diagnosis not present

## 2018-11-19 DIAGNOSIS — E1140 Type 2 diabetes mellitus with diabetic neuropathy, unspecified: Secondary | ICD-10-CM | POA: Diagnosis not present

## 2018-11-19 DIAGNOSIS — Z79899 Other long term (current) drug therapy: Secondary | ICD-10-CM | POA: Diagnosis not present

## 2018-11-19 DIAGNOSIS — Z7984 Long term (current) use of oral hypoglycemic drugs: Secondary | ICD-10-CM | POA: Diagnosis not present

## 2018-11-19 DIAGNOSIS — M7989 Other specified soft tissue disorders: Secondary | ICD-10-CM

## 2018-11-19 DIAGNOSIS — I110 Hypertensive heart disease with heart failure: Secondary | ICD-10-CM | POA: Diagnosis not present

## 2018-11-19 DIAGNOSIS — Z7982 Long term (current) use of aspirin: Secondary | ICD-10-CM | POA: Insufficient documentation

## 2018-11-19 DIAGNOSIS — I5022 Chronic systolic (congestive) heart failure: Secondary | ICD-10-CM | POA: Insufficient documentation

## 2018-11-19 DIAGNOSIS — R609 Edema, unspecified: Secondary | ICD-10-CM

## 2018-11-19 LAB — BASIC METABOLIC PANEL
Anion gap: 5 (ref 5–15)
BUN: 26 mg/dL — ABNORMAL HIGH (ref 8–23)
CO2: 30 mmol/L (ref 22–32)
Calcium: 9.7 mg/dL (ref 8.9–10.3)
Chloride: 106 mmol/L (ref 98–111)
Creatinine, Ser: 1.42 mg/dL — ABNORMAL HIGH (ref 0.44–1.00)
GFR calc Af Amer: 42 mL/min — ABNORMAL LOW (ref >60)
GFR calc non Af Amer: 36 mL/min — ABNORMAL LOW (ref >60)
Glucose, Bld: 180 mg/dL — ABNORMAL HIGH (ref 70–99)
Potassium: 4.2 mmol/L (ref 3.5–5.1)
SODIUM: 141 mmol/L (ref 135–145)

## 2018-11-19 LAB — CBC
HCT: 41.2 % (ref 36.0–46.0)
Hemoglobin: 12.7 g/dL (ref 12.0–15.0)
MCH: 28.9 pg (ref 26.0–34.0)
MCHC: 30.8 g/dL (ref 30.0–36.0)
MCV: 93.6 fL (ref 80.0–100.0)
Platelets: 178 10*3/uL (ref 150–400)
RBC: 4.4 MIL/uL (ref 3.87–5.11)
RDW: 15.5 % (ref 11.5–15.5)
WBC: 5.8 10*3/uL (ref 4.0–10.5)
nRBC: 0 % (ref 0.0–0.2)

## 2018-11-19 LAB — TROPONIN I: Troponin I: <0.03 ng/mL (ref <0.03)

## 2018-11-19 LAB — BRAIN NATRIURETIC PEPTIDE: B Natriuretic Peptide: 25 pg/mL (ref 0.0–100.0)

## 2018-11-19 MED ORDER — CYCLOBENZAPRINE HCL 7.5 MG PO TABS: 7.5000 mg | ORAL_TABLET | Freq: Three times a day (TID) | ORAL | 0 refills | Status: DC | PRN

## 2018-11-19 NOTE — Discharge Instructions (Signed)
Return to the ER for new, worsening, persistent severe pain, weakness or numbness, difficulty walking, worsening swelling, shortness of breath, or any other new or worsening symptoms that concern you.

## 2018-11-19 NOTE — ED Provider Notes (Signed)
[facility] Emergency Department Provider Note ____________________________________________   First MD Initiated Contact with Patient 11/19/18 [phone]     (approximate)  I have reviewed the triage vital signs and the nursing notes.   HISTORY  Chief Complaint Leg Swelling    HPI Shelby [name] is a 77 y.o. female with PMH as noted below who presents with bilateral leg pain, acute onset since yesterday, described as cramping, and occurring mainly in her thighs.  She states that she has also had swelling intermittently for the last few months.  She denies shortness of breath, chest pain, rash, fever, or other acute symptoms.  Past Medical History:  Diagnosis Date  . Anemia   . Cardiomyopathy (HCC)    Ejection Fraction 30-35% per ECHO 2016  . CHF (congestive heart failure) (HCC)   . Diabetes (HCC)   . Dysrhythmia   . Hyperlipidemia   . Hypertension   . Peripheral neuropathy   . Peripheral neuropathy   . Sleep apnea     Patient Active Problem List   Diagnosis Date Noted  . Lymphedema 09/03/2018  . Chronic systolic heart failure (HCC) 03/08/2018  . Encounter for dental exam and cleaning w/o abnormal findings 01/14/2018  . High risk medication use 12/11/2015  . Mass of right elbow 10/04/2015  . Left lumbar radiculopathy 12/05/2014  . Anemia 06/12/2014  . Cardiomyopathy (HCC) 06/12/2014  . Benign essential hypertension 06/12/2014  . Hyperlipidemia 06/12/2014  . Obstructive sleep apnea 06/12/2014  . Peripheral neuropathy 06/12/2014  . Proteinuria 06/12/2014  . 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HCC) 06/12/2014  . 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 Vitamin B 12 deficiency 06/12/2014  . Vitamin D deficiency 06/12/2014    Past Surgical History:  Procedure Laterality Date  . ABDOMINAL HYSTERECTOMY    . COLONOSCOPY    . COLONOSCOPY N/A 04/01/2018   Procedure: COLONOSCOPY;   Surgeon: Christena Deem, MD;  Location: [facility] ENDOSCOPY;  Service: Endoscopy;  Laterality: N/A;  . COLONOSCOPY WITH PROPOFOL N/A 12/29/2017   Procedure: COLONOSCOPY WITH PROPOFOL;  Surgeon: Christena Deem, MD;  Location: [facility] ENDOSCOPY;  Service: Endoscopy;  Laterality: N/A;  . ESOPHAGOGASTRODUODENOSCOPY (EGD) WITH PROPOFOL N/A 12/29/2017   Procedure: ESOPHAGOGASTRODUODENOSCOPY (EGD) WITH PROPOFOL;  Surgeon: Christena Deem, MD;  Location: [facility] ENDOSCOPY;  Service: Endoscopy;  Laterality: N/A;  . ESOPHAGOGASTRODUODENOSCOPY (EGD) WITH PROPOFOL N/A 04/01/2018   Procedure: ESOPHAGOGASTRODUODENOSCOPY (EGD) WITH PROPOFOL;  Surgeon: Christena Deem, MD;  Location: [facility] ENDOSCOPY;  Service: Endoscopy;  Laterality: N/A;  . EUS N/A 05/20/2018   Procedure: ESOPHAGEAL ENDOSCOPIC ULTRASOUND (EUS) RADIAL;  Surgeon: Bearl Mulberry, MD;  Location: [facility] ENDOSCOPY;  Service: Gastroenterology;  Laterality: N/A;    Prior to Admission medications   Medication Sig Start Date End Date Taking? Authorizing Provider  amLODipine (NORVASC) 5 MG tablet Take 10 mg by mouth daily.  12/11/15  Yes [provider]  atorvastatin (LIPITOR) 80 MG tablet Take 80 mg by mouth daily.  12/11/15  Yes [provider]  brimonidine (ALPHAGAN) 0.2 % ophthalmic solution Place 1 drop into both eyes 2 (two) times daily.    Yes [provider]  candesartan (ATACAND) 32 MG tablet Take by mouth daily.  07/15/16  Yes [provider]  carvedilol (COREG) 25 MG tablet Take by mouth 2 (two) times daily with a meal.  07/15/16  Yes [provider]  conjugated estrogens (PREMARIN) vaginal cream Place 1 Applicatorful vaginally daily. Apply  0.5mg  (pea-sized amount)  just inside the vaginal introitus with a finger-tip every night for two weeks and then Monday, Wednesday and Friday nights. 03/11/18  Yes McGowan, Carollee Herter A, PA-C  donepezil (ARICEPT) 10 MG tablet Take 10 mg by mouth at bedtime.   Yes [provider]  fesoterodine (TOVIAZ) 8 MG TB24 tablet Take 1 tablet (8 mg total) by mouth daily. 03/11/18  Yes McGowan, Carollee Herter A, PA-C  metFORMIN (GLUCOPHAGE) 500 MG tablet Take 1,000 mg by mouth 2 (two) times daily with a meal.   Yes [provider]  pantoprazole (PROTONIX) 40 MG tablet Take 40 mg by mouth daily.    Yes [provider]  travoprost, benzalkonium, (TRAVATAN) 0.004 % ophthalmic solution Place 1 drop into both eyes at bedtime.    Yes [provider]  tretinoin (RETIN-A) 0.025 % cream Apply 1 application topically at bedtime.   Yes [provider]  aspirin EC 81 MG tablet Take 81 mg by mouth daily.    [provider]  cyclobenzaprine (FEXMID) 7.5 MG tablet Take 1 tablet (7.5 mg total) by mouth 3 (three) times daily as needed for muscle spasms. 11/19/18   Dionne Bucy, MD  ferrous sulfate 325 (65 FE) MG tablet Take by mouth. Takes one pill every 2-3 days to minimize constipation    [provider]  magnesium oxide (MAG-OX) 400 MG tablet Take 400 mg by mouth 2 (two) times daily.    [provider]  Multiple Vitamin (MULTI-VITAMINS) TABS Take 1 tablet by mouth daily.     [provider]    Allergies Mirabegron and Furosemide  Family History  Problem Relation Age of Onset  . Diabetes Mother   . Heart disease Mother   . Bladder Cancer Neg Hx   . Kidney cancer Neg Hx     Social History Social History   Tobacco Use  . Smoking status: Never Smoker  . Smokeless tobacco: Never Used  Substance Use Topics  . Alcohol use: No  . Drug use: No    Review of Systems  Constitutional: No fever. Eyes: No redness. ENT: No sore throat. Cardiovascular: Denies chest pain. Respiratory: Denies shortness of breath. Gastrointestinal: No vomiting or diarrhea.  Genitourinary: Negative for dysuria or frequency.  Musculoskeletal: Negative for back pain.  Positive for bilateral leg pain. Skin: Negative for  rash. Neurological: Negative for headache.  Negative for focal weakness or numbness.   ____________________________________________   PHYSICAL EXAM:  VITAL SIGNS: ED Triage Vitals  Enc Vitals Group     BP 11/19/18 0623 120/64     Pulse Rate 11/19/18 0623 71     Resp 11/19/18 0623 18     Temp 11/19/18 0623 97.8 F (36.6 C)     Temp Source 11/19/18 0623 Oral     SpO2 11/19/18 0623 97 %     Weight 11/19/18 0618 225 lb 15.5 oz (102.5 kg)     Height 11/19/18 0618 5\' 4"  (1.626 m)     Head Circumference --      Peak Flow --      Pain Score 11/19/18 0618 8     Pain Loc --      Pain Edu? --      Excl. in GC? --     Constitutional: Alert and oriented. Well appearing and in no acute distress. Eyes: Conjunctivae are normal.  Head: Atraumatic. Nose: No congestion/rhinnorhea. Mouth/Throat: Mucous membranes are moist.   Neck: Normal range of motion.  Cardiovascular: Normal rate,  regular rhythm. Grossly normal heart sounds.  Good peripheral circulation. Respiratory: Normal respiratory effort.  No retractions. Lungs CTAB. Gastrointestinal: No distention.  Musculoskeletal: Trace bilateral lower extremity edema.  Mild tenderness to distal thighs bilaterally.  Extremities warm and well perfused.  Neurologic:  Normal speech and language.  Motor and sensory intact in bilateral lower extremities.   Skin:  Skin is warm and dry. No rash noted.  Bilateral legs with no erythema, induration, or abnormal warmth. Psychiatric: Mood and affect are normal. Speech and behavior are normal.  ____________________________________________   LABS (all labs ordered are listed, but only abnormal results are displayed)  Labs Reviewed  BASIC METABOLIC PANEL - Abnormal; Notable for the following components:      Result Value   Glucose, Bld 180 (*)    BUN 26 (*)    Creatinine, Ser 1.42 (*)    GFR calc non Af Amer 36 (*)    GFR calc Af Amer 42 (*)    All other components within normal limits  CBC   TROPONIN I  BRAIN NATRIURETIC PEPTIDE   ____________________________________________  EKG   ____________________________________________  RADIOLOGY  US venous lower extremity: No acute DVT CXR: No focal infiltrate or edema  ____________________________________________   PROCEDURES  Procedure(s) performed: No  Procedures  Critical Care performed: No ____________________________________________   INITIAL IMPRESSION / ASSESSMENT AND PLAN / ED COURSE  Pertinent labs & imaging results that were available during my care of the patient were reviewed by me and considered in my medical decision making (see chart for details).  77 year old female with PMH as noted above including history of cardiomyopathy and CHF presents with bilateral lower extremity intermittent swelling for several months, and cramping pain over the last 1 to 2 days.  On exam the patient is overall relatively well-appearing.  There are no significant findings on exam of her lower extremities except for possible trace edema and mild tenderness.  No rash, induration, or other acute findings.  Overall I suspect most likely dependent edema or mild venous stasis.  Initial lab work-up obtained from triage is reassuring.  I will add on a BNP although my clinical suspicion for acute CHF is very low.  I will also add on bilateral lower extremity ultrasound to rule out DVT.  If this is negative, I anticipate discharge home.  ----------------------------------------- 12:44 PM on 11/19/2018 -----------------------------------------  Ultrasound and BNP were both negative.  I counseled the patient and her son on the results of the work-up and the plan of care.  I recommend elevating the legs for the dependent edema, and will give Flexeril for her cramping/spasms.  Return precautions given, and they expressed understanding. ____________________________________________   FINAL CLINICAL IMPRESSION(S) / ED DIAGNOSES  Final  diagnoses:  Peripheral edema      NEW MEDICATIONS STARTED DURING THIS VISIT:  Discharge Medication List as of 11/19/2018 11:42 AM    START taking these medications   Details  cyclobenzaprine (FEXMID) 7.5 MG tablet Take 1 tablet (7.5 mg total) by mouth 3 (three) times daily as needed for muscle spasms., Starting Fri 11/19/2018, Normal         Note:  This document was prepared using Dragon voice recognition software and may include unintentional dictation errors.    Dionne BucySiadecki, Camrin Lapre, MD 11/19/18 1245

## 2018-11-19 NOTE — ED Notes (Signed)
Bilateral +1 swelling. Pulses intact. NAD

## 2018-11-19 NOTE — ED Triage Notes (Signed)
Patient c/o bilateral lower extremity swelling beginning today. Patient c/o pain to bilateral lower extremities X 2 days.

## 2018-11-25 ENCOUNTER — Ambulatory Visit: Admitting: Family

## 2018-12-07 ENCOUNTER — Encounter: Payer: Self-pay | Admitting: Family

## 2018-12-07 ENCOUNTER — Ambulatory Visit: Payer: Medicare Other | Attending: Family | Admitting: Family

## 2018-12-07 VITALS — BP 140/92 | HR 77 | Resp 18 | Ht 64.0 in | Wt 229.5 lb

## 2018-12-07 DIAGNOSIS — I110 Hypertensive heart disease with heart failure: Secondary | ICD-10-CM | POA: Insufficient documentation

## 2018-12-07 DIAGNOSIS — Z79899 Other long term (current) drug therapy: Secondary | ICD-10-CM | POA: Diagnosis not present

## 2018-12-07 DIAGNOSIS — I890 Lymphedema, not elsewhere classified: Secondary | ICD-10-CM | POA: Insufficient documentation

## 2018-12-07 DIAGNOSIS — G4730 Sleep apnea, unspecified: Secondary | ICD-10-CM | POA: Diagnosis not present

## 2018-12-07 DIAGNOSIS — E1140 Type 2 diabetes mellitus with diabetic neuropathy, unspecified: Secondary | ICD-10-CM | POA: Insufficient documentation

## 2018-12-07 DIAGNOSIS — I10 Essential (primary) hypertension: Secondary | ICD-10-CM

## 2018-12-07 DIAGNOSIS — E785 Hyperlipidemia, unspecified: Secondary | ICD-10-CM | POA: Diagnosis not present

## 2018-12-07 DIAGNOSIS — Z8249 Family history of ischemic heart disease and other diseases of the circulatory system: Secondary | ICD-10-CM | POA: Insufficient documentation

## 2018-12-07 DIAGNOSIS — Z7982 Long term (current) use of aspirin: Secondary | ICD-10-CM | POA: Insufficient documentation

## 2018-12-07 DIAGNOSIS — Z833 Family history of diabetes mellitus: Secondary | ICD-10-CM | POA: Diagnosis not present

## 2018-12-07 DIAGNOSIS — Z888 Allergy status to other drugs, medicaments and biological substances status: Secondary | ICD-10-CM | POA: Insufficient documentation

## 2018-12-07 DIAGNOSIS — D649 Anemia, unspecified: Secondary | ICD-10-CM | POA: Insufficient documentation

## 2018-12-07 DIAGNOSIS — I5022 Chronic systolic (congestive) heart failure: Secondary | ICD-10-CM | POA: Diagnosis present

## 2018-12-07 DIAGNOSIS — Z7984 Long term (current) use of oral hypoglycemic drugs: Secondary | ICD-10-CM | POA: Diagnosis not present

## 2018-12-07 DIAGNOSIS — Z90710 Acquired absence of both cervix and uterus: Secondary | ICD-10-CM | POA: Diagnosis not present

## 2018-12-07 DIAGNOSIS — I272 Pulmonary hypertension, unspecified: Secondary | ICD-10-CM | POA: Diagnosis not present

## 2018-12-07 DIAGNOSIS — G4733 Obstructive sleep apnea (adult) (pediatric): Secondary | ICD-10-CM | POA: Insufficient documentation

## 2018-12-07 DIAGNOSIS — I429 Cardiomyopathy, unspecified: Secondary | ICD-10-CM | POA: Diagnosis not present

## 2018-12-07 NOTE — Patient Instructions (Addendum)
Resume weighing daily and call for an overnight weight gain of > 2 pounds or a weekly weight gain of >5 pounds. 

## 2018-12-07 NOTE — Progress Notes (Signed)
Patient ID: Shelby PilotCatherine Brown [name], female    DOB: [birth year], 77 y.o.   MRN: [id number]  HPI  Shelby [name] is a 77 y/o female with a history of DM, hyperlipidemia, HTN, anemia, obstructive sleep apnea, neuropathy and chronic heart failure.   Echo report from 02/13/15 reviewed and showed an EF of 35% along with moderate MR and mild PHTN. Function study on 03/16/15 showed an EF of 52%.   Was in the ED 11/19/18 due to peripheral edema where she was treated and released.     She presents today for a follow-up visit with a chief complaint of pedal edema in bilateral lower legs. She describes this as chronic in nature having been present for several years. She has associated neuropathy in her feet and slight weight gain along with this. She denies any difficulty sleeping, dizziness, fatigue, cough, shortness of breath, chest pain, palpitations or abdominal distention.   Past Medical History:  Diagnosis Date  . Anemia   . Cardiomyopathy (HCC)    Ejection Fraction 30-35% per ECHO 2016  . CHF (congestive heart failure) (HCC)   . Diabetes (HCC)   . Dysrhythmia   . Hyperlipidemia   . Hypertension   . Peripheral neuropathy   . Peripheral neuropathy   . Sleep apnea    Past Surgical History:  Procedure Laterality Date  . ABDOMINAL HYSTERECTOMY    . COLONOSCOPY    . COLONOSCOPY N/A 04/01/2018   Procedure: COLONOSCOPY;  Surgeon: Christena DeemSkulskie, Martin U, MD;  Location[facility] ENDOSCOPY;  Service: Endoscopy;  Laterality: N/A;  . COLONOSCOPY WITH PROPOFOL N/A 12/29/2017   Procedure: COLONOSCOPY WITH PROPOFOL;  Surgeon: Christena DeemSkulskie, Martin U, MD;  [facility];  Service: Endoscopy;  Laterality: N/A;  . ESOPHAGOGASTRODUODENOSCOPY (EGD) WITH PROPOFOL N/A 12/29/2017   Procedure: ESOPHAGOGASTRODUODENOSCOPY (EGD) WITH PROPOFOL;  Surgeon: Christena DeemSkulskie, Martin U, MD;  [facility];  Service: Endoscopy;  Laterality: N/A;  . ESOPHAGOGASTRODUODENOSCOPY (EGD) WITH PROPOFOL N/A 04/01/2018   Procedure:  ESOPHAGOGASTRODUODENOSCOPY (EGD) WITH PROPOFOL;  Surgeon: Christena DeemSkulskie, Martin U, MD;  [facility];  Service: Endoscopy;  Laterality: N/A;  . EUS N/A 05/20/2018   Procedure: ESOPHAGEAL ENDOSCOPIC ULTRASOUND (EUS) RADIAL;  Surgeon: Bearl MulberryBurbridge, Rebecca A, MD;  [facility];  Service: Gastroenterology;  Laterality: N/A;   Family History  Problem Relation Age of Onset  . Diabetes Mother   . Heart disease Mother   . Bladder Cancer Neg Hx   . Kidney cancer Neg Hx    Social History   Tobacco Use  . Smoking status: Never Smoker  . Smokeless tobacco: Never Used  Substance Use Topics  . Alcohol use: No   Allergies  Allergen Reactions  . Mirabegron Other (See Comments)    Elevated BP  . Furosemide Other (See Comments)    Legs swelling    Prior to Admission medications   Medication Sig Start Date End Date Taking? Authorizing Provider  amLODipine (NORVASC) 5 MG tablet Take 10 mg by mouth daily.  12/11/15  Yes [provider]  aspirin EC 81 MG tablet Take 81 mg by mouth daily.   Yes [provider]  atorvastatin (LIPITOR) 80 MG tablet Take 80 mg by mouth daily.  12/11/15  Yes [provider]  brimonidine (ALPHAGAN) 0.2 % ophthalmic solution Place 1 drop into both eyes 2 (two) times daily.    Yes [provider]  candesartan (ATACAND) 32 MG tablet Take by mouth daily.  07/15/16  Yes [provider]  carvedilol (COREG) 25 MG tablet  Take by mouth 2 (two) times daily with a meal.  07/15/16  Yes [provider]  conjugated estrogens (PREMARIN) vaginal cream Place 1 Applicatorful vaginally daily. Apply 0.5mg  (pea-sized amount)  just inside the vaginal introitus with a finger-tip every night for two weeks and then Monday, Wednesday and Friday nights. 03/11/18  Yes McGowan, Carollee HerterShannon A, PA-C  donepezil (ARICEPT) 10 MG tablet Take 10 mg by mouth at bedtime.   Yes [provider]  ferrous sulfate 325 (65 FE) MG tablet Take by mouth.  Takes one pill every 2-3 days to minimize constipation   Yes [provider]  fesoterodine (TOVIAZ) 8 MG TB24 tablet Take 1 tablet (8 mg total) by mouth daily. 03/11/18  Yes McGowan, Carollee HerterShannon A, PA-C  magnesium oxide (MAG-OX) 400 MG tablet Take 400 mg by mouth 2 (two) times daily.   Yes [provider]  metFORMIN (GLUCOPHAGE) 500 MG tablet Take 1,000 mg by mouth 2 (two) times daily with a meal.   Yes [provider]  Multiple Vitamin (MULTI-VITAMINS) TABS Take 1 tablet by mouth daily.    Yes [provider]  pantoprazole (PROTONIX) 40 MG tablet Take 40 mg by mouth daily.    Yes [provider]  travoprost, benzalkonium, (TRAVATAN) 0.004 % ophthalmic solution Place 1 drop into both eyes at bedtime.    Yes [provider]  tretinoin (RETIN-A) 0.025 % cream Apply 1 application topically at bedtime.   Yes [provider]  cyclobenzaprine (FEXMID) 7.5 MG tablet Take 1 tablet (7.5 mg total) by mouth 3 (three) times daily as needed for muscle spasms. Patient not taking: Reported on 12/07/2018 11/19/18   Dionne BucySiadecki, Sebastian, MD    Review of Systems  Constitutional: Negative for appetite change and fatigue.  HENT: Negative for congestion, postnasal drip and sore throat.   Eyes: Negative.   Respiratory: Negative for cough, chest tightness and shortness of breath.   Cardiovascular: Positive for leg swelling. Negative for chest pain and palpitations.  Gastrointestinal: Negative for abdominal distention and abdominal pain.  Endocrine: Negative.   Genitourinary: Negative.   Musculoskeletal: Positive for back pain (when walking long distances). Negative for arthralgias and neck pain.  Skin: Positive for rash (lower legs).       Itching lower legs  Allergic/Immunologic: Negative.   Neurological: Positive for numbness (tingling in feet due to neuropathy). Negative for dizziness and light-headedness.       Leg cramping at times  Hematological:  Negative for adenopathy. Does not bruise/bleed easily.  Psychiatric/Behavioral: Negative for dysphoric mood and sleep disturbance. The patient is not nervous/anxious.    Vitals:   12/07/18 1424  BP: (!) 140/92  Pulse: 77  Resp: 18  SpO2: 96%  Weight: 229 lb 8 oz (104.1 kg)  Height: 5\' 4"  (1.626 m)   Wt Readings from Last 3 Encounters:  12/07/18 229 lb 8 oz (104.1 kg)  11/19/18 225 lb 15.5 oz (102.5 kg)  10/08/18 226 lb (102.5 kg)   Lab Results  Component Value Date   CREATININE 1.42 (H) 11/19/2018   CREATININE 1.54 (H) 09/02/2018   CREATININE 1.58 (H) 07/16/2018    Physical Exam  Constitutional: She is oriented to person, place, and time. She appears well-developed and well-nourished.  HENT:  Head: Normocephalic and atraumatic.  Neck: Normal range of motion. Neck supple. No JVD present.  Cardiovascular: Normal rate and regular rhythm.  Pulmonary/Chest: Effort normal. She has no wheezes. She has no rales.  Abdominal: Soft. She exhibits no distension. There  is no abdominal tenderness.  Musculoskeletal:        General: Edema (1+ pitting edema bilateral lower legs) present. No tenderness.  Neurological: She is alert and oriented to person, place, and time.  Skin: Skin is warm and dry.  Psychiatric: She has a normal mood and affect. Her behavior is normal. Thought content normal.  Nursing note and vitals reviewed.  Assessment & Plan:  1: Chronic heart failure with reduced ejection fraction- - NYHA class I - euvolemic today - weighing daily. Reminded to call for an overnight weight gain of >2 pounds or a weekly weight gain of >5 pounds - weight up 3 pounds since she was last here 3 months ago - trying to not add salt; reviewed the importance of closely following a 2000mg  sodium diet - drinks ~32 ounces of water daily along with a a soda or tea - saw cardiology (Fath) 06/09/18  - has received her flu vaccine  - need to get updated echo to evaluate EF; if it remains low,  consider changing her candesartan to entresto  2: HTN- - BP mildly elevated today; continue to monitor - saw PCP Harrington Challenger) 07/27/18 - BMP from 11/19/18 reviewed and showed sodium 141, potassium 4.2, creatinine 1.42 and GFR 42   3: Diabetes- - fasting glucose has been "good" per her report - A1c on 07/27/18 was 6.5%  4: Obstructive sleep apnea- - last sleep study was July 2019 - saw pulmonology Juanda Bond) 10/08/18 - says that she's sleeping so much better since she's been wearing her CPAP  5: Lymphedema- - stage 2 - elevating her legs but edema worsens throughout the day - wearing TED hose daily but edema persists - limited in her ability to exercise due to her back pain - will make referral for lymphapress compression boots  Patient did not bring her medications nor a list. Each medication was verbally reviewed with the patient and she was encouraged to bring the bottles to every visit to confirm accuracy of list.  Return in 3 months or sooner for any questions/problems before then.

## 2018-12-09 ENCOUNTER — Encounter: Payer: Self-pay | Admitting: Family

## 2018-12-15 ENCOUNTER — Ambulatory Visit (INDEPENDENT_AMBULATORY_CARE_PROVIDER_SITE_OTHER): Payer: Medicare Other

## 2018-12-15 DIAGNOSIS — G4733 Obstructive sleep apnea (adult) (pediatric): Secondary | ICD-10-CM

## 2018-12-15 NOTE — Progress Notes (Signed)
95 percentile pressure 12   95th percentile leak 2.6   apnea index 14.4 /hr  apnea-hypopnea index  16.6 /hr   total days used  >4 hr 40 days  total days used <4 hr 3 days  Total compliance 93 percent  She states she is doing great feeling much better. Her AHI is still high will watch to see if comes down.

## 2018-12-20 ENCOUNTER — Encounter: Payer: Self-pay | Admitting: Internal Medicine

## 2018-12-20 ENCOUNTER — Ambulatory Visit (INDEPENDENT_AMBULATORY_CARE_PROVIDER_SITE_OTHER): Payer: Medicare Other | Admitting: Internal Medicine

## 2018-12-20 VITALS — BP 129/73 | HR 70 | Resp 16 | Ht 64.0 in | Wt 227.0 lb

## 2018-12-20 DIAGNOSIS — G4733 Obstructive sleep apnea (adult) (pediatric): Secondary | ICD-10-CM | POA: Diagnosis not present

## 2018-12-20 DIAGNOSIS — I5022 Chronic systolic (congestive) heart failure: Secondary | ICD-10-CM

## 2018-12-20 DIAGNOSIS — Z9989 Dependence on other enabling machines and devices: Secondary | ICD-10-CM

## 2018-12-20 NOTE — Progress Notes (Signed)
[facility] [address] [place], [place] [id number]  Pulmonary Sleep Medicine   Office Visit Note  Patient Name: Shelby PilotCatherine Brown [name] DOB: 1941-08-15 MRN [id number]  Date of Service: 12/20/2018  Complaints/HPI: OSA compliance. Last compliance is 93% she is doing much better.  Patient is here for a compliance visit she is actually doing very well.  She states that she is not sleepy like she used to be she is not falling asleep while watching TV.  She is using the CPAP device every night.  She denies having any complications associated with the CPAP.  She has not had any sinus infections denies any throat infections.  Denies chest pain no cough no congestion noted.  ROS  General: (-) fever, (-) chills, (-) night sweats, (-) weakness Skin: (-) rashes, (-) itching,. Eyes: (-) visual changes, (-) redness, (-) itching. Nose and Sinuses: (-) nasal stuffiness or itchiness, (-) postnasal drip, (-) nosebleeds, (-) sinus trouble. Mouth and Throat: (-) sore throat, (-) hoarseness. Neck: (-) swollen glands, (-) enlarged thyroid, (-) neck pain. Respiratory: - cough, (-) bloody sputum, - shortness of breath, - wheezing. Cardiovascular: - ankle swelling, (-) chest pain. Lymphatic: (-) lymph node enlargement. Neurologic: (-) numbness, (-) tingling. Psychiatric: (-) anxiety, (-) depression   Current Medication: Outpatient Encounter Medications as of 12/20/2018  Medication Sig  . amLODipine (NORVASC) 5 MG tablet Take 10 mg by mouth daily.   Marland Kitchen. aspirin EC 81 MG tablet Take 81 mg by mouth daily.  Marland Kitchen. atorvastatin (LIPITOR) 80 MG tablet Take 80 mg by mouth daily.   . brimonidine (ALPHAGAN) 0.2 % ophthalmic solution Place 1 drop into both eyes 2 (two) times daily.   . candesartan (ATACAND) 32 MG tablet Take by mouth daily.   . carvedilol (COREG) 25 MG tablet Take by mouth 2 (two) times daily with a meal.   . conjugated estrogens (PREMARIN) vaginal cream Place 1 Applicatorful vaginally  daily. Apply 0.5mg  (pea-sized amount)  just inside the vaginal introitus with a finger-tip every night for two weeks and then Monday, Wednesday and Friday nights.  . cyclobenzaprine (FEXMID) 7.5 MG tablet Take 1 tablet (7.5 mg total) by mouth 3 (three) times daily as needed for muscle spasms.  Marland Kitchen. donepezil (ARICEPT) 10 MG tablet Take 10 mg by mouth at bedtime.  . ferrous sulfate 325 (65 FE) MG tablet Take by mouth. Takes one pill every 2-3 days to minimize constipation  . fesoterodine (TOVIAZ) 8 MG TB24 tablet Take 1 tablet (8 mg total) by mouth daily.  . magnesium oxide (MAG-OX) 400 MG tablet Take 400 mg by mouth 2 (two) times daily.  . metFORMIN (GLUCOPHAGE) 500 MG tablet Take 1,000 mg by mouth 2 (two) times daily with a meal.  . Multiple Vitamin (MULTI-VITAMINS) TABS Take 1 tablet by mouth daily.   . pantoprazole (PROTONIX) 40 MG tablet Take 40 mg by mouth daily.   . travoprost, benzalkonium, (TRAVATAN) 0.004 % ophthalmic solution Place 1 drop into both eyes at bedtime.   . tretinoin (RETIN-A) 0.025 % cream Apply 1 application topically at bedtime.   No facility-administered encounter medications on file as of 12/20/2018.     Surgical History: Past Surgical History:  Procedure Laterality Date  . ABDOMINAL HYSTERECTOMY    . COLONOSCOPY    . COLONOSCOPY N/A 04/01/2018   Procedure: COLONOSCOPY;  Surgeon: Christena DeemSkulskie, Martin U, MD;  [facility];  Service: Endoscopy;  Laterality: N/A;  . COLONOSCOPY WITH PROPOFOL N/A 12/29/2017   Procedure: COLONOSCOPY WITH PROPOFOL;  Surgeon: Christena Deem, MD;  Location: [facility] ENDOSCOPY;  Service: Endoscopy;  Laterality: N/A;  . ESOPHAGOGASTRODUODENOSCOPY (EGD) WITH PROPOFOL N/A 12/29/2017   Procedure: ESOPHAGOGASTRODUODENOSCOPY (EGD) WITH PROPOFOL;  Surgeon: Christena Deem, MD;  Location: [facility] ENDOSCOPY;  Service: Endoscopy;  Laterality: N/A;  . ESOPHAGOGASTRODUODENOSCOPY (EGD) WITH PROPOFOL N/A 04/01/2018   Procedure:  ESOPHAGOGASTRODUODENOSCOPY (EGD) WITH PROPOFOL;  Surgeon: Christena Deem, MD;  Location: [facility] ENDOSCOPY;  Service: Endoscopy;  Laterality: N/A;  . EUS N/A 05/20/2018   Procedure: ESOPHAGEAL ENDOSCOPIC ULTRASOUND (EUS) RADIAL;  Surgeon: Bearl Mulberry, MD;  Location: [facility] ENDOSCOPY;  Service: Gastroenterology;  Laterality: N/A;    Medical History: Past Medical History:  Diagnosis Date  . Anemia   . Cardiomyopathy (HCC)    Ejection Fraction 30-35% per ECHO 2016  . CHF (congestive heart failure) (HCC)   . Diabetes (HCC)   . Dysrhythmia   . Hyperlipidemia   . Hypertension   . Peripheral neuropathy   . Peripheral neuropathy   . Sleep apnea     Family History: Family History  Problem Relation Age of Onset  . Diabetes Mother   . Heart disease Mother   . Bladder Cancer Neg Hx   . Kidney cancer Neg Hx     Social History: Social History   Socioeconomic History  . Marital status: Widowed    Spouse name: Not on file  . Number of children: Not on file  . Years of education: Not on file  . Highest education level: Not on file  Occupational History  . Not on file  Social Needs  . Financial resource strain: Not very hard  . Food insecurity:    Worry: Never true    Inability: Never true  . Transportation needs:    Medical: No    Non-medical: No  Tobacco Use  . Smoking status: Never Smoker  . Smokeless tobacco: Never Used  Substance and Sexual Activity  . Alcohol use: No  . Drug use: No  . Sexual activity: Yes    Birth control/protection: Post-menopausal  Lifestyle  . Physical activity:    Days per week: 0 days    Minutes per session: 0 min  . Stress: Not at all  Relationships  . Social connections:    Talks on phone: More than three times a week    Gets together: Once a week    Attends religious service: More than 4 times per year    Active member of club or organization: Yes    Attends meetings of clubs or organizations: More than 4 times per year     Relationship status: Widowed  . Intimate partner violence:    Fear of current or ex partner: Patient refused    Emotionally abused: Patient refused    Physically abused: Patient refused    Forced sexual activity: Patient refused  Other Topics Concern  . Not on file  Social History Narrative  . Not on file    Vital Signs: Blood pressure 129/73, pulse 70, resp. rate 16, height 5\' 4"  (1.626 m), weight 227 lb (103 kg), SpO2 98 %.  Examination: General Appearance: The patient is well-developed, well-nourished, and in no distress. Skin: Gross inspection of skin unremarkable. Head: normocephalic, no gross deformities. Eyes: no gross deformities noted. ENT: ears appear grossly normal no exudates. Neck: Supple. No thyromegaly. No LAD. Respiratory: no rhonchi noted at this time. Cardiovascular: Normal S1 and S2 without murmur or rub. Extremities: No cyanosis. pulses are equal. Neurologic: Alert and oriented. No  involuntary movements.  LABS: Recent Results (from the past 2160 hour(s))  Basic metabolic panel     Status: Abnormal   Collection Time: 11/19/18  6:33 AM  Result Value Ref Range   Sodium 141 135 - 145 mmol/L   Potassium 4.2 3.5 - 5.1 mmol/L   Chloride 106 98 - 111 mmol/L   CO2 30 22 - 32 mmol/L   Glucose, Bld 180 (H) 70 - 99 mg/dL   BUN 26 (H) 8 - 23 mg/dL   Creatinine, Ser 0.98 (H) 0.44 - 1.00 mg/dL   Calcium 9.7 8.9 - 11.9 mg/dL   GFR calc non Af Amer 36 (L) >60 mL/min   GFR calc Af Amer 42 (L) >60 mL/min   Anion gap 5 5 - 15    Comment: Performed at [facility], [address] [place], [place] [address]  CBC     Status: None   Collection Time: 11/19/18  6:33 AM  Result Value Ref Range   WBC 5.8 4.0 - 10.5 K/uL   RBC 4.40 3.87 - 5.11 MIL/uL   Hemoglobin 12.7 12.0 - 15.0 g/dL   HCT 95.6 21.3 - 08.6 %   MCV 93.6 80.0 - 100.0 fL   MCH 28.9 26.0 - 34.0 pg   MCHC 30.8 30.0 - 36.0 g/dL   RDW 57.8 46.9 - 62.9 %   Platelets 178 150 - 400 K/uL   nRBC 0.0 0.0  - 0.2 %    Comment: Performed at [facility], [address] [place], [place] [address]  Troponin I - ONCE - STAT     Status: None   Collection Time: 11/19/18  6:33 AM  Result Value Ref Range   Troponin I <0.03 <0.03 ng/mL    Comment: Performed at [facility], [address] [place], [place] [address]  Brain natriuretic peptide     Status: None   Collection Time: 11/19/18  6:33 AM  Result Value Ref Range   B Natriuretic Peptide 25.0 0.0 - 100.0 pg/mL    Comment: Performed at [facility], [address], [place], [place] [address]    Radiology: Dg Chest 2 View  Result Date: 11/19/2018 CLINICAL DATA:  Hypertension.  Lower extremity edema EXAM: CHEST - 2 VIEW COMPARISON:  Chest CT June 30, 2018 FINDINGS: There is no appreciable edema or consolidation. The heart size and pulmonary vascularity are normal. No adenopathy. There is aortic atherosclerosis. There is mild degenerative change in the thoracic spine. IMPRESSION: Aortic atherosclerosis.  No edema or consolidation. Aortic Atherosclerosis (ICD10-I70.0). Electronically Signed   By: Bretta Bang III M.D.   On: 11/19/2018 06:58   US Venous Img Lower Bilateral  Result Date: 11/19/2018 CLINICAL DATA:  Bilateral lower extremity pain and edema for the past 6 months. Evaluate for DVT. EXAM: BILATERAL LOWER EXTREMITY VENOUS DOPPLER ULTRASOUND TECHNIQUE: Gray-scale sonography with graded compression, as well as color Doppler and duplex ultrasound were performed to evaluate the lower extremity deep venous systems from the level of the common femoral vein and including the common femoral, femoral, profunda femoral, popliteal and calf veins including the posterior tibial, peroneal and gastrocnemius veins when visible. The superficial great saphenous vein was also interrogated. Spectral Doppler was utilized to evaluate flow at rest and with distal augmentation maneuvers in the common femoral, femoral and popliteal  veins. COMPARISON:  None. FINDINGS: RIGHT LOWER EXTREMITY Common Femoral Vein: No evidence of thrombus. Normal compressibility, respiratory phasicity and response to augmentation. Saphenofemoral Junction: No evidence of  thrombus. Normal compressibility and flow on color Doppler imaging. Profunda Femoral Vein: No evidence of thrombus. Normal compressibility and flow on color Doppler imaging. Femoral Vein: No evidence of thrombus. Normal compressibility, respiratory phasicity and response to augmentation. Popliteal Vein: No evidence of thrombus. Normal compressibility, respiratory phasicity and response to augmentation. Calf Veins: No evidence of thrombus. Normal compressibility and flow on color Doppler imaging. Superficial Great Saphenous Vein: No evidence of thrombus. Normal compressibility. Venous Reflux:  None. Other Findings:  None. LEFT LOWER EXTREMITY Common Femoral Vein: No evidence of thrombus. Normal compressibility, respiratory phasicity and response to augmentation. Saphenofemoral Junction: No evidence of thrombus. Normal compressibility and flow on color Doppler imaging. Profunda Femoral Vein: No evidence of thrombus. Normal compressibility and flow on color Doppler imaging. Femoral Vein: No evidence of thrombus. Normal compressibility, respiratory phasicity and response to augmentation. Popliteal Vein: No evidence of thrombus. Normal compressibility, respiratory phasicity and response to augmentation. Calf Veins: No evidence of thrombus. Normal compressibility and flow on color Doppler imaging. Superficial Great Saphenous Vein: No evidence of thrombus. Normal compressibility. Venous Reflux:  None. Other Findings:  None. IMPRESSION: No evidence of DVT within either lower extremity. Electronically Signed   By: Simonne Come M.D.   On: 11/19/2018 10:16    No results found.  No results found.    Assessment and Plan: Patient Active Problem List   Diagnosis Date Noted  . Lymphedema 09/03/2018  .  Chronic systolic heart failure (HCC) 03/08/2018  . Encounter for dental exam and cleaning w/o abnormal findings 01/14/2018  . High risk medication use 12/11/2015  . Mass of right elbow 10/04/2015  . Left lumbar radiculopathy 12/05/2014  . Anemia 06/12/2014  . Cardiomyopathy (HCC) 06/12/2014  . Benign essential hypertension 06/12/2014  . Hyperlipidemia 06/12/2014  . Obstructive sleep apnea 06/12/2014  . Peripheral neuropathy 06/12/2014  . Proteinuria 06/12/2014  . 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HCC) 06/12/2014  . 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 Vitamin B 12 deficiency 06/12/2014  . Vitamin D deficiency 06/12/2014    1. OSA on CPAP her compliance is excellent she will continue with the current CPAP settings this has significantly helped her as far as her daytime symptoms are concerned with less fatigue during the daytime. 2. Chronic Systolic heart failure continue to monitor fluid status will continue with supportive care 3. Morbid obesity she realizes that she needs to lose weight she needs to work on diet and exercise  General Counseling: I have discussed the findings of the evaluation and examination with Santina Evans.  I have also discussed any further diagnostic evaluation thatmay be needed or ordered today. Catarina verbalizes understanding of the findings of todays visit. We also reviewed her medications today and discussed drug interactions and side effects including but not limited excessive drowsiness and altered mental states. We also discussed that there is always a risk not just to her but also people around her. she has been encouraged to call the office with any questions or concerns that should arise related to todays visit.    Time spent: 15 minutes  I have personally obtained a history, examined the patient, evaluated laboratory and imaging results, formulated the assessment and plan and placed  orders.    Yevonne Pax, MD [facility] Pulmonary and Critical Care Sleep medicine

## 2018-12-20 NOTE — Patient Instructions (Signed)
CPAP and BPAP Information  CPAP and BPAP are methods of helping a person breathe with the use of air pressure. CPAP stands for "continuous positive airway pressure." BPAP stands for "bi-level positive airway pressure." In both methods, air is blown through your nose or mouth and into your air passages to help you breathe well.  CPAP and BPAP use different amounts of pressure to blow air. With CPAP, the amount of pressure stays the same while you breathe in and out. With BPAP, the amount of pressure is increased when you breathe in (inhale) so that you can take larger breaths. Your health care provider will recommend whether CPAP or BPAP would be more helpful for you.  Why are CPAP and BPAP treatments used?  CPAP or BPAP can be helpful if you have:  · Sleep apnea.  · Chronic obstructive pulmonary disease (COPD).  · Heart failure.  · Medical conditions that weaken the muscles of the chest including muscular dystrophy, or neurological diseases such as amyotrophic lateral sclerosis (ALS).  · Other problems that cause breathing to be weak, abnormal, or difficult.  CPAP is most commonly used for obstructive sleep apnea (OSA) to keep the airways from collapsing when the muscles relax during sleep.  How is CPAP or BPAP administered?  Both CPAP and BPAP are provided by a small machine with a flexible plastic tube that attaches to a plastic mask. You wear the mask. Air is blown through the mask into your nose or mouth. The amount of pressure that is used to blow the air can be adjusted on the machine. Your health care provider will determine the pressure setting that should be used based on your individual needs.  When should CPAP or BPAP be used?  In most cases, the mask only needs to be worn during sleep. Generally, the mask needs to be worn throughout the night and during any daytime naps. People with certain medical conditions may also need to wear the mask at other times when they are awake. Follow instructions from your  health care provider about when to use the machine.  What are some tips for using the mask?    · Because the mask needs to be snug, some people feel trapped or closed-in (claustrophobic) when first using the mask. If you feel this way, you may need to get used to the mask. One way to do this is by holding the mask loosely over your nose or mouth and then gradually applying the mask more snugly. You can also gradually increase the amount of time that you use the mask.  · Masks are available in various types and sizes. Some fit over your mouth and nose while others fit over just your nose. If your mask does not fit well, talk with your health care provider about getting a different one.  · If you are using a mask that fits over your nose and you tend to breathe through your mouth, a chin strap may be applied to help keep your mouth closed.  · The CPAP and BPAP machines have alarms that may sound if the mask comes off or develops a leak.  · If you have trouble with the mask, it is very important that you talk with your health care provider about finding a way to make the mask easier to tolerate. Do not stop using the mask. Stopping the use of the mask could have a negative impact on your health.  What are some tips for using the machine?  ·   Place your CPAP or BPAP machine on a secure table or stand near an electrical outlet.  · Know where the on/off switch is located on the machine.  · Follow instructions from your health care provider about how to set the pressure on your machine and when you should use it.  · Do not eat or drink while the CPAP or BPAP machine is on. Food or fluids could get pushed into your lungs by the pressure of the CPAP or BPAP.  · Do not smoke. Tobacco smoke residue can damage the machine.  · For home use, CPAP and BPAP machines can be rented or purchased through home health care companies. Many different brands of machines are available. Renting a machine before purchasing may help you find out  which particular machine works well for you.  · Keep the CPAP or BPAP machine and attachments clean. Ask your health care provider for specific instructions.  Get help right away if:  · You have redness or open areas around your nose or mouth where the mask fits.  · You have trouble using the CPAP or BPAP machine.  · You cannot tolerate wearing the CPAP or BPAP mask.  · You have pain, discomfort, and bloating in your abdomen.  Summary  · CPAP and BPAP are methods of helping a person breathe with the use of air pressure.  · Both CPAP and BPAP are provided by a small machine with a flexible plastic tube that attaches to a plastic mask.  · If you have trouble with the mask, it is very important that you talk with your health care provider about finding a way to make the mask easier to tolerate.  This information is not intended to replace advice given to you by your health care provider. Make sure you discuss any questions you have with your health care provider.  Document Released: 08/22/2004 Document Revised: 07/27/2018 Document Reviewed: 10/13/2016  Elsevier Interactive Patient Education © 2019 [employer]

## 2019-01-15 ENCOUNTER — Emergency Department: Payer: Medicare Other

## 2019-01-15 ENCOUNTER — Other Ambulatory Visit: Payer: Self-pay

## 2019-01-15 ENCOUNTER — Encounter: Payer: Self-pay | Admitting: Emergency Medicine

## 2019-01-15 ENCOUNTER — Emergency Department
Admission: EM | Admit: 2019-01-15 | Discharge: 2019-01-15 | Disposition: A | Discharge status: Home / Self Care | Payer: Medicare Other | Attending: Emergency Medicine | Admitting: Emergency Medicine

## 2019-01-15 DIAGNOSIS — I110 Hypertensive heart disease with heart failure: Secondary | ICD-10-CM | POA: Diagnosis not present

## 2019-01-15 DIAGNOSIS — Z7982 Long term (current) use of aspirin: Secondary | ICD-10-CM | POA: Insufficient documentation

## 2019-01-15 DIAGNOSIS — Z79899 Other long term (current) drug therapy: Secondary | ICD-10-CM | POA: Insufficient documentation

## 2019-01-15 DIAGNOSIS — E119 Type 2 diabetes mellitus without complications: Secondary | ICD-10-CM | POA: Diagnosis not present

## 2019-01-15 DIAGNOSIS — M546 Pain in thoracic spine: Secondary | ICD-10-CM | POA: Insufficient documentation

## 2019-01-15 DIAGNOSIS — I509 Heart failure, unspecified: Secondary | ICD-10-CM | POA: Insufficient documentation

## 2019-01-15 LAB — COMPREHENSIVE METABOLIC PANEL
ALT: 26 U/L (ref 0–44)
ANION GAP: 3 — AB (ref 5–15)
AST: 30 U/L (ref 15–41)
Albumin: 3.7 g/dL (ref 3.5–5.0)
Alkaline Phosphatase: 62 U/L (ref 38–126)
BUN: 19 mg/dL (ref 8–23)
CHLORIDE: 104 mmol/L (ref 98–111)
CO2: 32 mmol/L (ref 22–32)
Calcium: 9.6 mg/dL (ref 8.9–10.3)
Creatinine, Ser: 1.11 mg/dL — ABNORMAL HIGH (ref 0.44–1.00)
GFR calc Af Amer: 56 mL/min — ABNORMAL LOW (ref >60)
GFR calc non Af Amer: 49 mL/min — ABNORMAL LOW (ref >60)
Glucose, Bld: 97 mg/dL (ref 70–99)
Potassium: 4.2 mmol/L (ref 3.5–5.1)
Sodium: 139 mmol/L (ref 135–145)
Total Bilirubin: 0.8 mg/dL (ref 0.3–1.2)
Total Protein: 7.2 g/dL (ref 6.5–8.1)

## 2019-01-15 LAB — CBC
HCT: 39.2 % (ref 36.0–46.0)
Hemoglobin: 12.5 g/dL (ref 12.0–15.0)
MCH: 29.3 pg (ref 26.0–34.0)
MCHC: 31.9 g/dL (ref 30.0–36.0)
MCV: 92 fL (ref 80.0–100.0)
Platelets: 152 10*3/uL (ref 150–400)
RBC: 4.26 MIL/uL (ref 3.87–5.11)
RDW: 15.4 % (ref 11.5–15.5)
WBC: 5.8 10*3/uL (ref 4.0–10.5)
nRBC: 0 % (ref 0.0–0.2)

## 2019-01-15 LAB — GLUCOSE, CAPILLARY: Glucose-Capillary: 73 mg/dL (ref 70–99)

## 2019-01-15 LAB — TROPONIN I: Troponin I: <0.03 ng/mL (ref <0.03)

## 2019-01-15 MED ORDER — IBUPROFEN 400 MG PO TABS
400.0000 mg | ORAL_TABLET | ORAL | Status: AC
  Administered 2019-01-15: 400 mg via ORAL
  Filled 2019-01-15: qty 1

## 2019-01-15 MED ORDER — IOHEXOL 350 MG/ML SOLN
75.0000 mL | Freq: Once | INTRAVENOUS | Status: AC | PRN
  Administered 2019-01-15: 75 mL via INTRAVENOUS

## 2019-01-15 MED ORDER — CYCLOBENZAPRINE HCL 7.5 MG PO TABS: 7.5000 mg | ORAL_TABLET | Freq: Two times a day (BID) | ORAL | 0 refills | Status: DC | PRN

## 2019-01-15 MED ORDER — SODIUM CHLORIDE 0.9% FLUSH: 3.0000 mL | Freq: Once | INTRAVENOUS | Status: DC

## 2019-01-15 NOTE — ED Notes (Signed)
Signature pad not working. Reviewed discharge instructions with patient who verbalized understanding.

## 2019-01-15 NOTE — Discharge Instructions (Signed)
Do not drive within 8 hours of cyclobenzaprine use (it can make you drowsy).  Please follow-up with your primary care doctor, you will need a repeat CT scan without contrast in about 3 to 6 months to make certain that the small nodules noted in your chest are not developing into potential tumors.  Additionally, an outpatient MRI is advised for further evaluation of a small lesion noted in your kidney as well.  Please contact your primary care doctor for follow-up on both of these issues.   You have been seen in the Emergency Department (ED)  today for back pain.  Your workup and exam have not shown any acute abnormalities and you are likely suffering from muscle strain or possible problems with your discs, but there is no treatment that will fix your symptoms at this time.  Please take Motrin (ibuprofen) as needed for your pain according to the instructions written on the box.  Alternatively, for the next five days you can take 600mg  three times daily with meals (it may upset your stomach).  Please follow up with your doctor as soon as possible regarding today's ED visit and your back pain.  Return to the ED for development of chest pain or trouble breathing, worsening back pain, fever, weakness or numbness of either leg, or if you develop either (1) an inability to urinate or have bowel movements, or (2) loss of your ability to control your bathroom functions (if you start having "accidents"), or if you develop other new symptoms that concern you.

## 2019-01-15 NOTE — ED Triage Notes (Signed)
Upper back pain x 4 days.

## 2019-01-15 NOTE — ED Provider Notes (Signed)
[facility] Emergency Department Provider Note ____________________________________________   First MD Initiated Contact with Patient 01/15/19 0920     (approximate)  I have reviewed the triage vital signs and the nursing notes.  HISTORY  Chief Complaint Back Pain  HPI Shelby [name] is a 78 y.o. female here for evaluation of upper back pain  Patient reports about 4 days ago she been experiencing pain in her mid upper back, little more over the right side than the left.  It comes and goes, seems to be a little bit worse with bending.  She does report that she has a wood burning stove and she has had to use that quite a bit with the cooler weather recently and wonders if lifting the wood into there as an cause some the discomfort.  There is no other symptoms with it.  No chest pain.  No trouble breathing.  No nausea no vomiting.  Pain has not moved.  She reports that overall, just feels like an ache to her pulled muscle, but she does not recall any specific injury.  Denies any numbness tingling or weakness in the arms or legs.  No cold or blue hands or feet.  Does have a history of congestive heart failure, no shortness of breath, no swelling.  She took some Aleve yesterday which was helpful, also using a heat pad and warm showers that does provide some relief  Pain currently moderate, right side of the back and having no lower back discomfort.  Reports the pain is really between the shoulder blade area  Past Medical History:  Diagnosis Date  . Anemia   . Cardiomyopathy (HCC)    Ejection Fraction 30-35% per ECHO 2016  . CHF (congestive heart failure) (HCC)   . Diabetes (HCC)   . Dysrhythmia   . Hyperlipidemia   . Hypertension   . Peripheral neuropathy   . Peripheral neuropathy   . Sleep apnea     Patient Active Problem List   Diagnosis Date Noted  . Lymphedema 09/03/2018  . Chronic systolic heart failure (HCC) 03/08/2018  . Encounter for  dental exam and cleaning w/o abnormal findings 01/14/2018  . High risk medication use 12/11/2015  . Mass of right elbow 10/04/2015  . Left lumbar radiculopathy 12/05/2014  . Anemia 06/12/2014  . Cardiomyopathy (HCC) 06/12/2014  . Benign essential hypertension 06/12/2014  . Hyperlipidemia 06/12/2014  . Obstructive sleep apnea 06/12/2014  . Peripheral neuropathy 06/12/2014  . Proteinuria 06/12/2014  . 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HCC) 06/12/2014  . 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 Vitamin B 12 deficiency 06/12/2014  . Vitamin D deficiency 06/12/2014    Past Surgical History:  Procedure Laterality Date  . ABDOMINAL HYSTERECTOMY    . COLONOSCOPY    . COLONOSCOPY N/A 04/01/2018   Procedure: COLONOSCOPY;  Surgeon: Christena Deem, MD;  Location: [facility] ENDOSCOPY;  Service: Endoscopy;  Laterality: N/A;  . COLONOSCOPY WITH PROPOFOL N/A 12/29/2017   Procedure: COLONOSCOPY WITH PROPOFOL;  Surgeon: Christena Deem, MD;  Location: [facility] ENDOSCOPY;  Service: Endoscopy;  Laterality: N/A;  . ESOPHAGOGASTRODUODENOSCOPY (EGD) WITH PROPOFOL N/A 12/29/2017   Procedure: ESOPHAGOGASTRODUODENOSCOPY (EGD) WITH PROPOFOL;  Surgeon: Christena Deem, MD;  Location: [facility] ENDOSCOPY;  Service: Endoscopy;  Laterality: N/A;  . ESOPHAGOGASTRODUODENOSCOPY (EGD) WITH PROPOFOL N/A 04/01/2018   Procedure: ESOPHAGOGASTRODUODENOSCOPY (EGD) WITH PROPOFOL;  Surgeon: Christena Deem, MD;  Location: [facility] ENDOSCOPY;  Service: Endoscopy;  Laterality: N/A;  .  EUS N/A 05/20/2018   Procedure: ESOPHAGEAL ENDOSCOPIC ULTRASOUND (EUS) RADIAL;  Surgeon: Bearl Mulberry, MD;  Location: [facility] ENDOSCOPY;  Service: Gastroenterology;  Laterality: N/A;    Prior to Admission medications   Medication Sig Start Date End Date Taking? Authorizing Provider  amLODipine (NORVASC) 5 MG tablet Take 10 mg by mouth daily.  12/11/15   [provider]  aspirin EC 81  MG tablet Take 81 mg by mouth daily.    [provider]  atorvastatin (LIPITOR) 80 MG tablet Take 80 mg by mouth daily.  12/11/15   [provider]  brimonidine (ALPHAGAN) 0.2 % ophthalmic solution Place 1 drop into both eyes 2 (two) times daily.     [provider]  candesartan (ATACAND) 32 MG tablet Take by mouth daily.  07/15/16   [provider]  carvedilol (COREG) 25 MG tablet Take by mouth 2 (two) times daily with a meal.  07/15/16   [provider]  conjugated estrogens (PREMARIN) vaginal cream Place 1 Applicatorful vaginally daily. Apply 0.5mg  (pea-sized amount)  just inside the vaginal introitus with a finger-tip every night for two weeks and then Monday, Wednesday and Friday nights. 03/11/18   Michiel Cowboy A, PA-C  cyclobenzaprine (FEXMID) 7.5 MG tablet Take 1 tablet (7.5 mg total) by mouth 2 (two) times daily as needed for muscle spasms. 01/15/19   Sharyn Creamer, MD  donepezil (ARICEPT) 10 MG tablet Take 10 mg by mouth at bedtime.    [provider]  ferrous sulfate 325 (65 FE) MG tablet Take by mouth. Takes one pill every 2-3 days to minimize constipation    [provider]  fesoterodine (TOVIAZ) 8 MG TB24 tablet Take 1 tablet (8 mg total) by mouth daily. 03/11/18   Michiel Cowboy A, PA-C  magnesium oxide (MAG-OX) 400 MG tablet Take 400 mg by mouth 2 (two) times daily.    [provider]  metFORMIN (GLUCOPHAGE) 500 MG tablet Take 1,000 mg by mouth 2 (two) times daily with a meal.    [provider]  Multiple Vitamin (MULTI-VITAMINS) TABS Take 1 tablet by mouth daily.     [provider]  pantoprazole (PROTONIX) 40 MG tablet Take 40 mg by mouth daily.     [provider]  travoprost, benzalkonium, (TRAVATAN) 0.004 % ophthalmic solution Place 1 drop into both eyes at bedtime.     [provider]  tretinoin (RETIN-A) 0.025 % cream Apply 1 application topically at bedtime.    [provider]    Allergies Mirabegron and Furosemide  Family History  Problem Relation Age of Onset  . Diabetes Mother   . Heart disease Mother   . Bladder Cancer Neg Hx   . Kidney cancer Neg Hx     Social History Social History   Tobacco Use  . Smoking status: Never Smoker  . Smokeless tobacco: Never Used  Substance Use Topics  . Alcohol use: No  . Drug use: No    Review of Systems Constitutional: No fever/chills Eyes: No visual changes. ENT: No sore throat. Cardiovascular: Denies chest pain. Respiratory: Denies shortness of breath. Gastrointestinal: No abdominal pain.   Genitourinary: Negative for dysuria. Musculoskeletal: No neck or lower back pain.  See HPI Skin: Negative for rash. Neurological: Negative for headaches, areas of focal weakness or numbness.    ____________________________________________   PHYSICAL EXAM:  VITAL SIGNS: ED Triage Vitals  Enc Vitals Group     BP 01/15/19 0807 (!) 153/78  Pulse Rate 01/15/19 0807 60     Resp 01/15/19 0807 20     Temp 01/15/19 0807 (!) 97.4 F (36.3 C)     Temp Source 01/15/19 0807 Oral     SpO2 01/15/19 0807 96 %     Weight 01/15/19 0809 225 lb (102.1 kg)     Height 01/15/19 0809 5\' 4"  (1.626 m)     Head Circumference --      Peak Flow --      Pain Score 01/15/19 0809 8     Pain Loc --      Pain Edu? --      Excl. in GC? --     Constitutional: Alert and oriented. Well appearing and in no acute distress. Eyes: Conjunctivae are normal. Head: Atraumatic. Nose: No congestion/rhinnorhea. Mouth/Throat: Mucous membranes are moist. Neck: No stridor.  Cardiovascular: Normal rate, regular rhythm. Grossly normal heart sounds.  Good peripheral circulation. Respiratory: Normal respiratory effort.  No retractions. Lungs CTAB. Gastrointestinal: Soft and nontender. No distention.  Negative Murphy.  Reports she is eating and drinking well, hungry at this time. Musculoskeletal: No lower extremity tenderness  nor edema.  No cervical or lumbar tenderness.  She does report some tenderness especially in the paraspinous region of the mid thoracic spine, roughly at the level of the scapula's. Neurologic:  Normal speech and language. No gross focal neurologic deficits are appreciated.  Skin:  Skin is warm, dry and intact. No rash noted. Psychiatric: Mood and affect are normal. Speech and behavior are normal.  ____________________________________________   LABS (all labs ordered are listed, but only abnormal results are displayed)  Labs Reviewed  COMPREHENSIVE METABOLIC PANEL - Abnormal; Notable for the following components:      Result Value   Creatinine, Ser 1.11 (*)    GFR calc non Af Amer 49 (*)    GFR calc Af Amer 56 (*)    Anion gap 3 (*)    All other components within normal limits  CBC  TROPONIN I  GLUCOSE, CAPILLARY   ____________________________________________  EKG  I reviewed at 815 Heart rate 60 QRS 90 QTc 420 Normal sinus rhythm, probable old inferior infarct, compared with her previous EKG no changes are noted.  No evidence of new ischemia ____________________________________________  RADIOLOGY  CT angiogram reviewed, negative for dissection  1. Technically adequate exam showing no acute aortic dissection. No aneurysm. Aortic atherosclerosis. (ICD10-I70.0) 2. Irregular nodule at the LEFT lung base measuring 8 x 5 millimeters. Second small mass in the LEFT LOWER lobe is 7 millimeters. Follow-up chest CT is recommended in 3-6 months. 3. Hyperdense lesion extending off the UPPER pole the LEFT kidney measuring 1.9 x 1.6 centimeters. This lesion is indeterminate by noncontrast and contrast-enhanced CT criteria, and slightly larger compared with study 1 year ago. Further characterization with contrast-enhanced renal protocol MRI is recommended. Recommend MRI as an outpatient for optimal patient cooperation and breath  hold ability. ____________________________________________   PROCEDURES  Procedure(s) performed: None  Procedures  Critical Care performed: No  ____________________________________________   INITIAL IMPRESSION / ASSESSMENT AND PLAN / ED COURSE  Pertinent labs & imaging results that were available during my care of the patient were reviewed by me and considered in my medical decision making (see chart for details).   Patient presents for atraumatic back pain.  Thoracic in nature, somewhat reproducible on exam and clinical history suggests likely musculoskeletal possibly attributed to her loading her woodstove frequently.  However, given her history of hypertension, age I  discussed with the patient that also this could represent problems deeper including problems with the "aorta".  After discussing risks benefits and alternatives and shared medical decision making, we will proceed with CT angiogram to exclude acute vascular etiology for back pain.  Her EKG is unchanged, she has no chest pain, her troponin is normal after 4 days of persistent discomfort.  This does not appear to represent acute coronary syndrome and she denies associated pulmonary symptoms.  No evidence of volume overload.  Overall reassuring clinical examination, I believe her CT angiogram is normal then the patient will be treated for musculoskeletal pain.  She has previously taken Flexeril, reports that has worked well in the past without side effect.  She is driving herself home today as her anticipation, thus not given in the ER but did discuss with the patient she can have a prescription to use at home if she is not driving within 8 hours of its use, and she is in agreement.  Felxeril prescription given, patient reports previous prescription which was the same that was helpful and did not cause side effect  Patient comfortable with plan for discharge, return precautions discussed.  Also discussed noted nodules on CT and  she will follow-up with her primary on this, regarding the kidneys she reports she already has a prescheduled MRI for follow-up on this as well through her primary.      ____________________________________________   FINAL CLINICAL IMPRESSION(S) / ED DIAGNOSES  Final diagnoses:  Acute right-sided thoracic back pain        Note:  This document was prepared using Dragon voice recognition software and may include unintentional dictation errors       Sharyn CreamerQuale, Verdine Grenfell, MD 01/15/19 1134

## 2019-01-15 NOTE — ED Notes (Signed)
Reported BS of 73 to RN Kim.

## 2019-02-07 ENCOUNTER — Encounter: Payer: Self-pay | Admitting: Pharmacist

## 2019-02-07 ENCOUNTER — Ambulatory Visit: Payer: Medicare Other | Attending: Family | Admitting: Family

## 2019-02-07 ENCOUNTER — Encounter: Payer: Self-pay | Admitting: Family

## 2019-02-07 VITALS — BP 112/73 | HR 78 | Resp 18 | Ht 64.0 in | Wt 232.1 lb

## 2019-02-07 DIAGNOSIS — Z888 Allergy status to other drugs, medicaments and biological substances status: Secondary | ICD-10-CM | POA: Insufficient documentation

## 2019-02-07 DIAGNOSIS — G4733 Obstructive sleep apnea (adult) (pediatric): Secondary | ICD-10-CM | POA: Diagnosis not present

## 2019-02-07 DIAGNOSIS — E1140 Type 2 diabetes mellitus with diabetic neuropathy, unspecified: Secondary | ICD-10-CM | POA: Diagnosis not present

## 2019-02-07 DIAGNOSIS — Z7984 Long term (current) use of oral hypoglycemic drugs: Secondary | ICD-10-CM | POA: Diagnosis not present

## 2019-02-07 DIAGNOSIS — I429 Cardiomyopathy, unspecified: Secondary | ICD-10-CM | POA: Insufficient documentation

## 2019-02-07 DIAGNOSIS — Z833 Family history of diabetes mellitus: Secondary | ICD-10-CM | POA: Diagnosis not present

## 2019-02-07 DIAGNOSIS — I110 Hypertensive heart disease with heart failure: Secondary | ICD-10-CM | POA: Insufficient documentation

## 2019-02-07 DIAGNOSIS — D649 Anemia, unspecified: Secondary | ICD-10-CM | POA: Diagnosis not present

## 2019-02-07 DIAGNOSIS — M549 Dorsalgia, unspecified: Secondary | ICD-10-CM | POA: Diagnosis not present

## 2019-02-07 DIAGNOSIS — I890 Lymphedema, not elsewhere classified: Secondary | ICD-10-CM | POA: Insufficient documentation

## 2019-02-07 DIAGNOSIS — G8929 Other chronic pain: Secondary | ICD-10-CM | POA: Diagnosis not present

## 2019-02-07 DIAGNOSIS — I5022 Chronic systolic (congestive) heart failure: Secondary | ICD-10-CM | POA: Insufficient documentation

## 2019-02-07 DIAGNOSIS — E1129 Type 2 diabetes mellitus with other diabetic kidney complication: Secondary | ICD-10-CM

## 2019-02-07 DIAGNOSIS — Z79899 Other long term (current) drug therapy: Secondary | ICD-10-CM | POA: Diagnosis not present

## 2019-02-07 DIAGNOSIS — Z7982 Long term (current) use of aspirin: Secondary | ICD-10-CM | POA: Insufficient documentation

## 2019-02-07 DIAGNOSIS — E785 Hyperlipidemia, unspecified: Secondary | ICD-10-CM | POA: Insufficient documentation

## 2019-02-07 DIAGNOSIS — I10 Essential (primary) hypertension: Secondary | ICD-10-CM

## 2019-02-07 DIAGNOSIS — Z8249 Family history of ischemic heart disease and other diseases of the circulatory system: Secondary | ICD-10-CM | POA: Diagnosis not present

## 2019-02-07 NOTE — Patient Instructions (Signed)
Continue weighing daily and call for an overnight weight gain of > 2 pounds or a weekly weight gain of >5 pounds. 

## 2019-02-07 NOTE — Progress Notes (Signed)
Patient ID: Shelby [name], female    DOB: 15-Jun-1941, 78 y.o.   MRN: [id number]  HPI  Shelby [name] is a 78 y/o female with a history of DM, hyperlipidemia, HTN, anemia, obstructive sleep apnea, neuropathy and chronic heart failure.   Echo report from 02/13/15 reviewed and showed an EF of 35% along with moderate MR and mild PHTN. Function study on 03/16/15 showed an EF of 52%.   Was in the ED 01/15/2019 due to right upper back pain. CT angiogram was done and she was released. Was in the ED 11/19/18 due to peripheral edema where she was treated and released.     She presents today for a follow-up visit with a chief complaint of pedal edema. She describes this as chronic in nature having been present for several years although she does feel like it's improving since wearing the compression boots. She has associated morning dizziness, chronic back pain and slight weight gain along with this. She denies any difficulty sleeping, abdominal distention, palpitations, chest pain, shortness of breath, cough or fatigue.   Past Medical History:  Diagnosis Date  . Anemia   . Cardiomyopathy (HCC)    Ejection Fraction 30-35% per ECHO 2016  . CHF (congestive heart failure) (HCC)   . Diabetes (HCC)   . Dysrhythmia   . Hyperlipidemia   . Hypertension   . Peripheral neuropathy   . Peripheral neuropathy   . Sleep apnea    Past Surgical History:  Procedure Laterality Date  . ABDOMINAL HYSTERECTOMY    . COLONOSCOPY    . COLONOSCOPY N/A 04/01/2018   Procedure: COLONOSCOPY;  Surgeon: Christena Deem, MD;  Location: [facility] ENDOSCOPY;  Service: Endoscopy;  Laterality: N/A;  . COLONOSCOPY WITH PROPOFOL N/A 12/29/2017   Procedure: COLONOSCOPY WITH PROPOFOL;  Surgeon: Christena Deem, MD;  Location: [facility] ENDOSCOPY;  Service: Endoscopy;  Laterality: N/A;  . ESOPHAGOGASTRODUODENOSCOPY (EGD) WITH PROPOFOL N/A 12/29/2017   Procedure: ESOPHAGOGASTRODUODENOSCOPY (EGD) WITH PROPOFOL;  Surgeon: Christena Deem,  MD;  Location: [facility] ENDOSCOPY;  Service: Endoscopy;  Laterality: N/A;  . ESOPHAGOGASTRODUODENOSCOPY (EGD) WITH PROPOFOL N/A 04/01/2018   Procedure: ESOPHAGOGASTRODUODENOSCOPY (EGD) WITH PROPOFOL;  Surgeon: Christena Deem, MD;  Location: [facility] ENDOSCOPY;  Service: Endoscopy;  Laterality: N/A;  . EUS N/A 05/20/2018   Procedure: ESOPHAGEAL ENDOSCOPIC ULTRASOUND (EUS) RADIAL;  Surgeon: Bearl Mulberry, MD;  Location: [facility] ENDOSCOPY;  Service: Gastroenterology;  Laterality: N/A;   Family History  Problem Relation Age of Onset  . Diabetes Mother   . Heart disease Mother   . Bladder Cancer Neg Hx   . Kidney cancer Neg Hx    Social History   Tobacco Use  . Smoking status: Never Smoker  . Smokeless tobacco: Never Used  Substance Use Topics  . Alcohol use: No   Allergies  Allergen Reactions  . Mirabegron Other (See Comments)    Elevated BP  . Furosemide Other (See Comments)    Legs swelling    Prior to Admission medications   Medication Sig Start Date End Date Taking? Authorizing Provider  amLODipine (NORVASC) 5 MG tablet Take 10 mg by mouth daily.  12/11/15  Yes [provider]  aspirin EC 81 MG tablet Take 81 mg by mouth daily.   Yes [provider]  atorvastatin (LIPITOR) 80 MG tablet Take 80 mg by mouth daily.  12/11/15  Yes [provider]  brimonidine (ALPHAGAN) 0.2 % ophthalmic solution Place 1 drop into both eyes 2 (two) times daily.  Yes [provider]  candesartan (ATACAND) 32 MG tablet Take by mouth daily.  07/15/16  Yes [provider]  carvedilol (COREG) 25 MG tablet Take by mouth 2 (two) times daily with a meal.  07/15/16  Yes [provider]  conjugated estrogens (PREMARIN) vaginal cream Place 1 Applicatorful vaginally daily. Apply 0.5mg  (pea-sized amount)  just inside the vaginal introitus with a finger-tip every night for two weeks and then Monday, Wednesday and Friday nights. 03/11/18  Yes McGowan, Carollee Herter A, PA-C   cyclobenzaprine (FEXMID) 7.5 MG tablet Take 1 tablet (7.5 mg total) by mouth 2 (two) times daily as needed for muscle spasms. 01/15/19  Yes Sharyn Creamer, MD  donepezil (ARICEPT) 10 MG tablet Take 10 mg by mouth at bedtime.   Yes [provider]  ferrous sulfate 325 (65 FE) MG tablet Take by mouth. Takes one pill every 2-3 days to minimize constipation   Yes [provider]  fesoterodine (TOVIAZ) 8 MG TB24 tablet Take 1 tablet (8 mg total) by mouth daily. 03/11/18  Yes McGowan, Carollee Herter A, PA-C  magnesium oxide (MAG-OX) 400 MG tablet Take 400 mg by mouth 2 (two) times daily.   Yes [provider]  Multiple Vitamin (MULTI-VITAMINS) TABS Take 1 tablet by mouth daily.    Yes [provider]  Omega-3 Fatty Acids (FISH OIL) 1000 MG CAPS Take 1,000 mg by mouth daily.   Yes [provider]  pantoprazole (PROTONIX) 40 MG tablet Take 40 mg by mouth daily.    Yes [provider]  travoprost, benzalkonium, (TRAVATAN) 0.004 % ophthalmic solution Place 1 drop into both eyes at bedtime.    Yes [provider]  tretinoin (RETIN-A) 0.025 % cream Apply 1 application topically at bedtime.   Yes [provider]  metFORMIN (GLUCOPHAGE) 500 MG tablet Take 1,000 mg by mouth 2 (two) times daily with a meal.    [provider]    Review of Systems  Constitutional: Negative for appetite change and fatigue.  HENT: Negative for congestion, postnasal drip and sore throat.   Eyes: Negative.   Respiratory: Negative for cough, chest tightness and shortness of breath.   Cardiovascular: Positive for leg swelling. Negative for chest pain and palpitations.  Gastrointestinal: Negative for abdominal distention and abdominal pain.  Endocrine: Negative.   Genitourinary: Negative.   Musculoskeletal: Positive for back pain (when walking long distances). Negative for arthralgias and neck pain.  Skin: Negative.        Itching lower legs  Allergic/Immunologic:  Negative.   Neurological: Positive for dizziness (first thing in the morning) and numbness (tingling in feet due to neuropathy). Negative for light-headedness.       Leg cramping at times  Hematological: Negative for adenopathy. Does not bruise/bleed easily.  Psychiatric/Behavioral: Negative for dysphoric mood and sleep disturbance. The patient is not nervous/anxious.    Vitals:   02/07/19 1323  BP: 112/73  Pulse: 78  Resp: 18  SpO2: 96%  Weight: 232 lb 2 oz (105.3 kg)  Height: 5\' 4"  (1.626 m)   Wt Readings from Last 3 Encounters:  02/07/19 232 lb 2 oz (105.3 kg)  01/15/19 225 lb (102.1 kg)  12/20/18 227 lb (103 kg)   Lab Results  Component Value Date   CREATININE 1.11 (H) 01/15/2019   CREATININE 1.42 (H) 11/19/2018   CREATININE 1.54 (H) 09/02/2018    Physical Exam  Constitutional: She is oriented to person, place, and time. She appears well-developed and well-nourished.  HENT:  Head:  Normocephalic and atraumatic.  Neck: Normal range of motion. Neck supple. No JVD present.  Cardiovascular: Normal rate and regular rhythm.  Pulmonary/Chest: Effort normal. She has no wheezes. She has no rales.  Abdominal: Soft. She exhibits no distension. There is no abdominal tenderness.  Musculoskeletal:        General: Edema (trace pitting edema ) present. No tenderness.  Neurological: She is alert and oriented to person, place, and time.  Skin: Skin is warm and dry.  Psychiatric: She has a normal mood and affect. Her behavior is normal. Thought content normal.  Nursing note and vitals reviewed.  Assessment & Plan:  1: Chronic heart failure with reduced ejection fraction- - NYHA class I - euvolemic today - weighing daily. Reminded to call for an overnight weight gain of >2 pounds or a weekly weight gain of >5 pounds - weight up 3 pounds from last visit 2 months ago - trying to not add salt; reviewed the importance of closely following a 2000mg  sodium diet - drinks ~32 ounces of water  daily along with a a soda or tea - saw cardiology (Fath) 06/09/18  - has received her flu vaccine  - BNP 11/19/18 was 25.0 - PharmD reconciled medications with the patient - have scheduled echo for 02/21/2019; depending on those results consider changing candesartan to entresto  2: HTN- - BP looks good today - saw PCP Harrington Challenger) 12/09/2018 - BMP from 01/15/2019 reviewed and showed sodium 139, potassium 4.2, creatinine 1.11 and GFR 56  3: Diabetes- - fasting glucose has been "good" per her report - A1c on 07/27/18 was 6.5%  4: Obstructive sleep apnea- - last sleep study was July 2019 - saw pulmonology Welton Flakes) 12/20/2018 - continues to sleep well wearing her CPAP  5: Lymphedema- - stage 2 - now wearing compression boots twice daily for about an hour each time - reports that edema is much improved since wearing the boots  Patient did not bring her medications nor a list. Each medication was verbally reviewed with the patient and she was encouraged to bring the bottles to every visit to confirm accuracy of list.  Return in 2 months or sooner for any questions/problems before then.

## 2019-02-07 NOTE — Progress Notes (Signed)
[facility] - HEART FAILURE CLINIC - PHARMACIST COUNSELING NOTE  ADHERENCE ASSESSMENT  Adherence strategy: pill box   Do you ever forget to take your medication? Yes (1) No (0)  Do you ever skip doses due to side effects? Yes (1) No (0)  Do you have trouble affording your medicines? Yes (1) No (0)  Are you ever unable to pick up your medication due to transportation difficulties? Yes (1) No (0)  Do you ever stop taking your medications because you don't believe they are helping? Yes (1) No (0)  Total score _0______    Recommendations given to patient about increasing adherence: Uses pill box and medications are delivered to her home.  Guideline-Directed Medical Therapy/Evidence Based Medicine    ACE/ARB/ARNI: candesartan 32 mg daily   Beta Blocker: carvedilol 25 mg twice daily   Aldosterone Antagonist: none Diuretic: none (uses compression boots twice daily)    SUBJECTIVE   HPI: Here for follow up visit. Does not weigh herself regularly. Using compression boots due to allergy to Lasix. Denies increased swelling in extremities.   Past Medical History:  Diagnosis Date  . Anemia   . Cardiomyopathy (HCC)    Ejection Fraction 30-35% per ECHO 2016  . CHF (congestive heart failure) (HCC)   . Diabetes (HCC)   . Dysrhythmia   . Hyperlipidemia   . Hypertension   . Peripheral neuropathy   . Peripheral neuropathy   . Sleep apnea         OBJECTIVE    Vital signs: HR 78, BP 112/73, weight (pounds) 232.2  ECHO: Date 02/13/15, EF 35%    BMP Latest Ref Rng & Units 01/15/2019 11/19/2018 09/02/2018  Glucose 70 - 99 mg/dL 97 098(J) 82  BUN 8 - 23 mg/dL 19 19(J) 47(W)  Creatinine 0.44 - 1.00 mg/dL 2.95(A) 2.13(Y) 8.65(H)  BUN/Creat Ratio 12 - 28 - - -  Sodium 135 - 145 mmol/L 139 141 141  Potassium 3.5 - 5.1 mmol/L 4.2 4.2 4.6  Chloride 98 - 111 mmol/L 104 106 104  CO2 22 - 32 mmol/L 32 30 32  Calcium 8.9 - 10.3 mg/dL 9.6 9.7 84.6     ASSESSMENT 78 year old female with HFrEF. Does not weigh herself regularly. She tries to avoid adding salt to her foods but admits her diet is not the best. She uses compression boots twice daily because she has an allergy to Lasix. Denies swelling in legs/feet or shortness of breath.   PLAN Counseled on importance of weighing daily and reporting weight gain of more than 2 pounds overnight or more than 5 pounds in a week. Reminded to try to avoid adding salt to foods. Patient is to have an ECHO to assess cardiac function and adjust medications as necessary.    Time spent: 10 minutes   K , Pharm.D. 02/07/2019 2:05 PM    Current Outpatient Medications:  .  amLODipine (NORVASC) 5 MG tablet, Take 10 mg by mouth daily. , Disp: , Rfl:  .  aspirin EC 81 MG tablet, Take 81 mg by mouth daily., Disp: , Rfl:  .  atorvastatin (LIPITOR) 80 MG tablet, Take 80 mg by mouth daily. , Disp: , Rfl:  .  brimonidine (ALPHAGAN) 0.2 % ophthalmic solution, Place 1 drop into both eyes 2 (two) times daily. , Disp: , Rfl:  .  candesartan (ATACAND) 32 MG tablet, Take by mouth daily. , Disp: , Rfl:  .  carvedilol (COREG) 25 MG tablet, Take by mouth 2 (  two) times daily with a meal. , Disp: , Rfl:  .  conjugated estrogens (PREMARIN) vaginal cream, Place 1 Applicatorful vaginally daily. Apply 0.5mg  (pea-sized amount)  just inside the vaginal introitus with a finger-tip every night for two weeks and then Monday, Wednesday and Friday nights., Disp: 30 g, Rfl: 12 .  cyclobenzaprine (FEXMID) 7.5 MG tablet, Take 1 tablet (7.5 mg total) by mouth 2 (two) times daily as needed for muscle spasms., Disp: 14 tablet, Rfl: 0 .  donepezil (ARICEPT) 10 MG tablet, Take 10 mg by mouth at bedtime., Disp: , Rfl:  .  ferrous sulfate 325 (65 FE) MG tablet, Take by mouth. Takes one pill every 2-3 days to minimize constipation, Disp: , Rfl:  .  fesoterodine (TOVIAZ) 8 MG TB24 tablet, Take 1 tablet (8 mg total) by mouth daily.,  Disp: 90 tablet, Rfl: 4 .  magnesium oxide (MAG-OX) 400 MG tablet, Take 400 mg by mouth 2 (two) times daily., Disp: , Rfl:  .  metFORMIN (GLUCOPHAGE) 500 MG tablet, Take 1,000 mg by mouth 2 (two) times daily with a meal., Disp: , Rfl:  .  Multiple Vitamin (MULTI-VITAMINS) TABS, Take 1 tablet by mouth daily. , Disp: , Rfl:  .  Omega-3 Fatty Acids (FISH OIL) 1000 MG CAPS, Take 1,000 mg by mouth daily., Disp: , Rfl:  .  pantoprazole (PROTONIX) 40 MG tablet, Take 40 mg by mouth daily. , Disp: , Rfl:  .  travoprost, benzalkonium, (TRAVATAN) 0.004 % ophthalmic solution, Place 1 drop into both eyes at bedtime. , Disp: , Rfl:  .  tretinoin (RETIN-A) 0.025 % cream, Apply 1 application topically at bedtime., Disp: , Rfl:    COUNSELING POINTS/CLINICAL PEARLS  Carvedilol (Goal: weight less than 85 kg is 25 mg BID, weight greater than 85 kg is 50 mg BID)  Patient should avoid activities requiring coordination until drug effects are realized, as drug may cause dizziness.  This drug may cause diarrhea, nausea, vomiting, arthralgia, back pain, myalgia, headache, vision disorder, erectile dysfunction, reduced libido, or fatigue.  Instruct patient to report signs/symptoms of adverse cardiovascular effects such as hypotension (especially in elderly patients), arrhythmias, syncope, palpitations, angina, or edema.  Drug may mask symptoms of hypoglycemia. Advise diabetic patients to carefully monitor blood sugar levels.  Patient should take drug with food.  Advise patient against sudden discontinuation of drug.   DRUGS TO AVOID IN HEART FAILURE  Drug or Class Mechanism  Analgesics . NSAIDs . COX-2 inhibitors . Glucocorticoids  Sodium and water retention, increased systemic vascular resistance, decreased response to diuretics   Diabetes Medications . Metformin . Thiazolidinediones o Rosiglitazone (Avandia) o Pioglitazone (Actos) . DPP4 Inhibitors o Saxagliptin (Onglyza) o Sitagliptin (Januvia)   Lactic  acidosis Possible calcium channel blockade   Unknown  Antiarrhythmics . Class I  o Flecainide o Disopyramide . Class III o Sotalol . Other o Dronedarone  Negative inotrope, proarrhythmic   Proarrhythmic, beta blockade  Negative inotrope  Antihypertensives . Alpha Blockers o Doxazosin . Calcium Channel Blockers o Diltiazem o Verapamil o Nifedipine . Central Alpha Adrenergics o Moxonidine . Peripheral Vasodilators o Minoxidil  Increases renin and aldosterone  Negative inotrope    Possible sympathetic withdrawal  Unknown  Anti-infective . Itraconazole . Amphotericin B  Negative inotrope Unknown  Hematologic . Anagrelide . Cilostazol   Possible inhibition of PD IV Inhibition of PD III causing arrhythmias  Neurologic/Psychiatric . Stimulants . Anti-Seizure Drugs o Carbamazepine o Pregabalin . Antidepressants o Tricyclics o Citalopram . Parkinsons o Bromocriptine  o Pergolide o Pramipexole . Antipsychotics o Clozapine . Antimigraine o Ergotamine o Methysergide . Appetite suppressants . Bipolar o Lithium  Peripheral alpha and beta agonist activity  Negative inotrope and chronotrope Calcium channel blockade  Negative inotrope, proarrhythmic Dose-dependent QT prolongation  Excessive serotonin activity/valvular damage Excessive serotonin activity/valvular damage Unknown  IgE mediated hypersensitivy, calcium channel blockade  Excessive serotonin activity/valvular damage Excessive serotonin activity/valvular damage Valvular damage  Direct myofibrillar degeneration, adrenergic stimulation  Antimalarials . Chloroquine . Hydroxychloroquine Intracellular inhibition of lysosomal enzymes  Urologic Agents . Alpha Blockers o Doxazosin o Prazosin o Tamsulosin o Terazosin  Increased renin and aldosterone  Adapted from Page RL, et al. "Drugs That May Cause or Exacerbate Heart Failure: A Scientific Statement from the American Heart   Association." Circulation 2016; 134:e32-e69. DOI: 10.1161/CIR.0000000000000426   MEDICATION ADHERENCES TIPS AND STRATEGIES 1. Taking medication as prescribed improves patient outcomes in heart failure (reduces hospitalizations, improves symptoms, increases survival) 2. Side effects of medications can be managed by decreasing doses, switching agents, stopping drugs, or adding additional therapy. Please let someone in the [facility] Failure Clinic know if you have having bothersome side effects so we can modify your regimen. Do not alter your medication regimen without talking to [place].  3. Medication reminders can help patients remember to take drugs on time. If you are missing or forgetting doses you can try linking behaviors, using pill boxes, or an electronic reminder like an alarm on your phone or an app. Some people can also get automated phone calls as medication reminders.

## 2019-02-21 ENCOUNTER — Ambulatory Visit: Admission: RE | Admit: 2019-02-21 | Source: Ambulatory Visit

## 2019-03-15 ENCOUNTER — Ambulatory Visit: Payer: Medicare Other | Admitting: Urology

## 2019-03-16 ENCOUNTER — Ambulatory Visit

## 2019-04-05 ENCOUNTER — Ambulatory Visit: Admission: RE | Admit: 2019-04-05 | Source: Ambulatory Visit

## 2019-04-05 ENCOUNTER — Ambulatory Visit

## 2019-04-06 ENCOUNTER — Ambulatory Visit: Payer: Self-pay

## 2019-04-08 ENCOUNTER — Telehealth: Payer: Self-pay

## 2019-04-08 NOTE — Telephone Encounter (Signed)
   TELEPHONE CALL NOTE  This patient has been deemed a candidate for follow-up tele-health visit to limit community exposure during the Covid-19 pandemic. I spoke with the patient via phone to discuss instructions. The patient was advised to review the section on consent for treatment as well. The patient will receive a phone call 2-3 days prior to their E-Visit at which time consent will be verbally confirmed. A Virtual Office Visit appointment type has been scheduled for 04/11/2019 with Clarisa Kindred FNP.  [name], CMA 04/08/2019 11:43 AM

## 2019-04-08 NOTE — Telephone Encounter (Signed)
TELEPHONE CALL NOTE  Shelby [name] has been deemed a candidate for a follow-up tele-health visit to limit community exposure during the Covid-19 pandemic. I spoke with the patient via phone to ensure availability of phone/video source, confirm preferred email & phone number, discuss instructions and expectations, and review consent.   I reminded Shelby [name] to be prepared with any vital sign and/or heart rhythm information that could potentially be obtained via home monitoring, at the time of her visit.  Finally, I reminded Shelby [name] to expect an e-mail containing a link for their video-based visit approximately 15 minutes before her visit, or alternatively, a phone call at the time of her visit if her visit is planned to be a phone encounter.  Did the patient verbally consent to treatment as below? YES  [name], CMA 04/08/2019 11:44 AM  CONSENT FOR TELE-HEALTH VISIT - PLEASE REVIEW  I hereby voluntarily request, consent and authorize [facility] and its employed or contracted physicians, physician assistants, nurse practitioners or other licensed health care professionals (the Practitioner), to provide me with telemedicine health care services (the "Services") as deemed necessary by the treating Practitioner. I acknowledge and consent to receive the Services by the Practitioner via telemedicine. I understand that the telemedicine visit will involve communicating with the Practitioner through telephonic communication technology and the disclosure of certain medical information by electronic transmission. I acknowledge that I have been given the opportunity to request an in-person assessment or other available alternative prior to the telemedicine visit and am voluntarily participating in the telemedicine visit.  I understand that I have the right to withhold or withdraw my consent to the use of telemedicine in the course of my care at any  time, without affecting my right to future care or treatment, and that the Practitioner or I may terminate the telemedicine visit at any time. I understand that I have the right to inspect all information obtained and/or recorded in the course of the telemedicine visit and may receive copies of available information for a reasonable fee.  I understand that some of the potential risks of receiving the Services via telemedicine include:  Marland Kitchen Delay or interruption in medical evaluation due to technological equipment failure or disruption; . Information transmitted may not be sufficient (e.g. poor resolution of images) to allow for appropriate medical decision making by the Practitioner; and/or  . In rare instances, security protocols could fail, causing a breach of personal health information.  Furthermore, I acknowledge that it is my responsibility to provide information about my medical history, conditions and care that is complete and accurate to the best of my ability. I acknowledge that Practitioner's advice, recommendations, and/or decision may be based on factors not within their control, such as incomplete or inaccurate data provided by me or lack of visual representation. I understand that the practice of medicine is not an exact science and that Practitioner makes no warranties or guarantees regarding treatment outcomes. I acknowledge that I will receive a copy of this consent concurrently upon execution via email to the email address I last provided but may also request a printed copy by calling the office of [facility].    I understand that my insurance may be billed for this visit.   I have read or had this consent read to me. . I understand the contents of this consent, which adequately explains the benefits and risks of the Services being provided via telemedicine.  Marland Kitchen  I have been provided ample opportunity to ask questions regarding this consent and the Services and have had my  questions answered to my satisfaction. . I give my informed consent for the services to be provided through the use of telemedicine in my medical care  By participating in this telemedicine visit I agree to the above.

## 2019-04-11 ENCOUNTER — Encounter: Payer: Self-pay | Admitting: Family

## 2019-04-11 ENCOUNTER — Other Ambulatory Visit: Payer: Self-pay

## 2019-04-11 ENCOUNTER — Ambulatory Visit: Payer: Medicare Other | Attending: Family | Admitting: Family

## 2019-04-11 VITALS — Wt 226.0 lb

## 2019-04-11 DIAGNOSIS — I5022 Chronic systolic (congestive) heart failure: Secondary | ICD-10-CM

## 2019-04-11 DIAGNOSIS — I890 Lymphedema, not elsewhere classified: Secondary | ICD-10-CM

## 2019-04-11 DIAGNOSIS — I10 Essential (primary) hypertension: Secondary | ICD-10-CM

## 2019-04-11 DIAGNOSIS — E1129 Type 2 diabetes mellitus with other diabetic kidney complication: Secondary | ICD-10-CM

## 2019-04-11 DIAGNOSIS — G4733 Obstructive sleep apnea (adult) (pediatric): Secondary | ICD-10-CM

## 2019-04-11 NOTE — Patient Instructions (Signed)
Continue weighing daily and call for an overnight weight gain of > 2 pounds or a weekly weight gain of >5 pounds. 

## 2019-04-11 NOTE — Progress Notes (Signed)
Virtual Visit via Telephone Note    Evaluation Performed:  Follow-up visit  This visit type was conducted due to national recommendations for restrictions regarding the COVID-19 Pandemic (e.g. social distancing).  This format is felt to be most appropriate for this patient at this time.  All issues noted in this document were discussed and addressed.  No physical exam was performed (except for noted visual exam findings with Video Visits).  Please refer to the patient's chart (MyChart message for video visits and phone note for telephone visits) for the patient's consent to telehealth for [facility]  Date:  04/11/2019   ID:  Shelby [name], DOB 06-14-1941, MRN [id number]  Patient Location:  [address] [facility] [place] [address]   Provider location:   [facility] [address] [place], [place] [address]  PCP:  Mickey Farber, MD  Cardiologist:  Harold Hedge, MD Electrophysiologist:  None   Chief Complaint:  Shortness of breath  History of Present Illness:    Shelby [name] is a 78 y.o. female who presents via audio/video conferencing for a telehealth visit today.  Patient verified DOB and address.  The patient does not have symptoms concerning for COVID-19 infection (fever, chills, cough, or new SHORTNESS OF BREATH).   Patient reports minimal shortness of breath upon moderate exertion. She describes this as chronic in nature having been present for several years. She has associated dizziness (with sudden position changes) and minimal fatigue along with this. She denies any pedal edema, abdominal distention, palpitations, chest pain, cough, difficulty sleeping or weight gain. Wearing her CPAP nightly and compression boots 1-2 times/ day.   Prior CV studies:   The following studies were reviewed today:  Echo report from 02/13/15 reviewed and showed an EF of 35%.   Past Medical History:  Diagnosis Date  . Anemia   .  Cardiomyopathy (HCC)    Ejection Fraction 30-35% per ECHO 2016  . CHF (congestive heart failure) (HCC)   . Diabetes (HCC)   . Dysrhythmia   . Hyperlipidemia   . Hypertension   . Peripheral neuropathy   . Peripheral neuropathy   . Sleep apnea    Past Surgical History:  Procedure Laterality Date  . ABDOMINAL HYSTERECTOMY    . COLONOSCOPY    . COLONOSCOPY N/A 04/01/2018   Procedure: COLONOSCOPY;  Surgeon: Christena Deem, MD;  Location: [facility] ENDOSCOPY;  Service: Endoscopy;  Laterality: N/A;  . COLONOSCOPY WITH PROPOFOL N/A 12/29/2017   Procedure: COLONOSCOPY WITH PROPOFOL;  Surgeon: Christena Deem, MD;  Location: [facility] ENDOSCOPY;  Service: Endoscopy;  Laterality: N/A;  . ESOPHAGOGASTRODUODENOSCOPY (EGD) WITH PROPOFOL N/A 12/29/2017   Procedure: ESOPHAGOGASTRODUODENOSCOPY (EGD) WITH PROPOFOL;  Surgeon: Christena Deem, MD;  Location: [facility] ENDOSCOPY;  Service: Endoscopy;  Laterality: N/A;  . ESOPHAGOGASTRODUODENOSCOPY (EGD) WITH PROPOFOL N/A 04/01/2018   Procedure: ESOPHAGOGASTRODUODENOSCOPY (EGD) WITH PROPOFOL;  Surgeon: Christena Deem, MD;  Location: [facility] ENDOSCOPY;  Service: Endoscopy;  Laterality: N/A;  . EUS N/A 05/20/2018   Procedure: ESOPHAGEAL ENDOSCOPIC ULTRASOUND (EUS) RADIAL;  Surgeon: Bearl Mulberry, MD;  Location: [facility] ENDOSCOPY;  Service: Gastroenterology;  Laterality: N/A;     Current Meds  Medication Sig  . aspirin EC 81 MG tablet Take 81 mg by mouth daily.  Marland Kitchen atorvastatin (LIPITOR) 80 MG tablet Take 80 mg by mouth daily.   . brimonidine (ALPHAGAN) 0.2 % ophthalmic solution Place 1 drop into both eyes 2 (two) times daily.   . candesartan (ATACAND)  32 MG tablet Take by mouth daily.   . carvedilol (COREG) 25 MG tablet Take by mouth 2 (two) times daily with a meal.   . conjugated estrogens (PREMARIN) vaginal cream Place 1 Applicatorful vaginally daily. Apply 0.5mg  (pea-sized amount)  just inside the vaginal introitus with a finger-tip every night for two weeks  and then Monday, Wednesday and Friday nights.  . donepezil (ARICEPT) 10 MG tablet Take 10 mg by mouth at bedtime.  . fesoterodine (TOVIAZ) 8 MG TB24 tablet Take 1 tablet (8 mg total) by mouth daily.  . magnesium oxide (MAG-OX) 400 MG tablet Take 400 mg by mouth daily.   . metFORMIN (GLUCOPHAGE) 500 MG tablet Take 1,000 mg by mouth 2 (two) times daily with a meal.  . Multiple Vitamin (MULTI-VITAMINS) TABS Take 1 tablet by mouth daily.   . Omega-3 Fatty Acids (FISH OIL) 1000 MG CAPS Take 1,000 mg by mouth daily.  . pantoprazole (PROTONIX) 40 MG tablet Take 40 mg by mouth daily.   . travoprost, benzalkonium, (TRAVATAN) 0.004 % ophthalmic solution Place 1 drop into both eyes at bedtime.   . tretinoin (RETIN-A) 0.025 % cream Apply 1 application topically at bedtime.     Allergies:   Mirabegron and Furosemide   Social History   Tobacco Use  . Smoking status: Never Smoker  . Smokeless tobacco: Never Used  Substance Use Topics  . Alcohol use: No  . Drug use: No     Family Hx: The patient's family history includes Diabetes in her mother; Heart disease in her mother. There is no history of Bladder Cancer or Kidney cancer.  ROS:   Please see the history of present illness.     All other systems reviewed and are negative.   Labs/Other Tests and Data Reviewed:    Recent Labs: 11/19/2018: B Natriuretic Peptide 25.0 01/15/2019: ALT 26; BUN 19; Creatinine, Ser 1.11; Hemoglobin 12.5; Platelets 152; Potassium 4.2; Sodium 139   Recent Lipid Panel No results found for: CHOL, TRIG, HDL, CHOLHDL, LDLCALC, LDLDIRECT  Wt Readings from Last 3 Encounters:  04/11/19 226 lb (102.5 kg)  02/07/19 232 lb 2 oz (105.3 kg)  01/15/19 225 lb (102.1 kg)     Exam:    Vital Signs:  Wt 226 lb (102.5 kg) Comment: self-reported  BMI 38.79 kg/m    Well nourished, well developed female in no  acute distress.   ASSESSMENT & PLAN:    1.Chronic heart failure with reduced ejection fraction- - NYHA class  II - euvolemic today based on patient's description of symptoms - weighing daily. Reminded to call for an overnight weight gain of >2 pounds or a weekly weight gain of >5 pounds - trying to not add salt; reviewed the importance of closely following a 2000mg  sodium diet - drinks ~32 ounces of water daily along with a a soda or tea - saw cardiology (Fath) 06/09/18  - BNP 11/19/18 was 25.0 - patient didn't show for echo last month so will need to reschedule once COVID-19 restrictions are lifted  2: HTN- - not checking her BP at home - saw PCP Harrington Challenger([place]) 12/09/2018 - BMP from 01/15/2019 reviewed and showed sodium 139, potassium 4.2, creatinine 1.11 and GFR 56  3: Diabetes- - fasting glucose at home today was 136 - A1c on 07/27/18 was 6.5%  4: Obstructive sleep apnea- - saw pulmonology Welton Flakes([place]) 12/20/2018 - continues to sleep well wearing her CPAP  5: Lymphedema- - stage 2 - wearing compression boots 1-2 times daily for about  an hour each time - no longer has edema   COVID-19 Education: The signs and symptoms of COVID-19 were discussed with the patient and how to seek care for testing (follow up with PCP or arrange E-visit).  The importance of social distancing was discussed today.  Patient Risk:   After full review of this patients clinical status, I feel that they are at least moderate risk at this time.  Time:   Today, I have spent 15 minutes with the patient with telehealth technology discussing medications, diet and symptoms to report.     Medication Adjustments/Labs and Tests Ordered: Current medicines are reviewed at length with the patient today.  Concerns regarding medicines are outlined above.   Tests Ordered: No orders of the defined types were placed in this encounter.  Medication Changes: No orders of the defined types were placed in this encounter.   Disposition:  Follow-up in 3 months or sooner for any questions/problems before then.   Signed, Delma Freeze, FNP   04/11/2019 1:48 PM    [facility]

## 2019-04-12 ENCOUNTER — Ambulatory Visit: Admitting: Family

## 2019-04-18 ENCOUNTER — Other Ambulatory Visit: Payer: Self-pay

## 2019-04-18 ENCOUNTER — Telehealth: Payer: Self-pay | Admitting: Urology

## 2019-04-18 ENCOUNTER — Telehealth: Payer: Medicare Other | Admitting: Urology

## 2019-04-18 NOTE — Telephone Encounter (Signed)
This patient was scheduled for a virtual visit today for follow-up MRI.  She had an MRI scheduled in late April but no showed for this.  Instead of having an appointment today, it makes more sense to schedule the MRI and then reschedule her virtual appointment.  Please call her and help her reschedule her MRI and virtual appointment.   Shelby Scotland, MD

## 2019-04-19 NOTE — Telephone Encounter (Signed)
Pt called and I advised her that we would have to get the MRI re-authorized and r/s her Virtual visit.

## 2019-04-20 ENCOUNTER — Ambulatory Visit (INDEPENDENT_AMBULATORY_CARE_PROVIDER_SITE_OTHER): Payer: Medicare Other

## 2019-04-20 ENCOUNTER — Other Ambulatory Visit: Payer: Self-pay

## 2019-04-20 ENCOUNTER — Ambulatory Visit: Payer: Medicare Other | Admitting: Urology

## 2019-04-20 DIAGNOSIS — G4733 Obstructive sleep apnea (adult) (pediatric): Secondary | ICD-10-CM

## 2019-04-20 NOTE — Progress Notes (Signed)
95 percentile pressure 12   95th percentile leak 6.7   apnea index 7.5 /hr  apnea-hypopnea index  9.1 /hr   total days used  >4 hr 87 days  total days used <4 hr 2 days  Total compliance 97 percent  Shelby [name] states she feels much better using cpap and is doing great. Will watch AHI it is down from previous 90 day download but is still little high at 9.1. No other problems or questions at this time

## 2019-04-26 NOTE — Telephone Encounter (Signed)
Sent message over to scheduling to get patient scheduled for her MRI that she missed   Shelby [name]

## 2019-05-06 ENCOUNTER — Ambulatory Visit
Admission: RE | Admit: 2019-05-06 | Discharge: 2019-05-06 | Disposition: A | Discharge status: Home / Self Care | Payer: Medicare Other | Source: Ambulatory Visit | Attending: Urology | Admitting: Urology

## 2019-05-06 ENCOUNTER — Other Ambulatory Visit: Payer: Self-pay

## 2019-05-06 DIAGNOSIS — N2889 Other specified disorders of kidney and ureter: Secondary | ICD-10-CM | POA: Diagnosis present

## 2019-05-06 LAB — POCT I-STAT CREATININE: Creatinine, Ser: 1.5 mg/dL — ABNORMAL HIGH (ref 0.44–1.00)

## 2019-05-06 MED ORDER — GADOBUTROL 1 MMOL/ML IV SOLN
10.0000 mL | Freq: Once | INTRAVENOUS | Status: AC | PRN
  Administered 2019-05-06: 11:00:00 10 mL via INTRAVENOUS

## 2019-06-27 ENCOUNTER — Encounter: Payer: Self-pay | Admitting: Internal Medicine

## 2019-06-27 ENCOUNTER — Ambulatory Visit (INDEPENDENT_AMBULATORY_CARE_PROVIDER_SITE_OTHER): Payer: Medicare Other | Admitting: Internal Medicine

## 2019-06-27 ENCOUNTER — Other Ambulatory Visit: Payer: Self-pay

## 2019-06-27 VITALS — BP 130/86 | HR 73 | Resp 16 | Ht 64.0 in | Wt 228.0 lb

## 2019-06-27 DIAGNOSIS — G4733 Obstructive sleep apnea (adult) (pediatric): Secondary | ICD-10-CM | POA: Diagnosis not present

## 2019-06-27 DIAGNOSIS — Z9989 Dependence on other enabling machines and devices: Secondary | ICD-10-CM

## 2019-06-27 DIAGNOSIS — I5022 Chronic systolic (congestive) heart failure: Secondary | ICD-10-CM

## 2019-06-27 NOTE — Progress Notes (Signed)
[facility] [address] [place], [place] [address]  Pulmonary Sleep Medicine   Office Visit Note  Patient Name: Shelby PilotCatherine Brown [name] DOB: 1941-03-30 MRN [id number]  Date of Service: 06/27/2019  Complaints/HPI: PT is here for follow up on osa.  She reports she is wearing her cpap nightly.  She reports excellent relief of symptoms using her machine.  She states she is using it nightly.  Her most recent compliance was 97%.  She denies any issues.  She is cleaning her machine with soap and water.  She is changing her mask seal and tubing as recommended .  ROS  General: (-) fever, (-) chills, (-) night sweats, (-) weakness Skin: (-) rashes, (-) itching,. Eyes: (-) visual changes, (-) redness, (-) itching. Nose and Sinuses: (-) nasal stuffiness or itchiness, (-) postnasal drip, (-) nosebleeds, (-) sinus trouble. Mouth and Throat: (-) sore throat, (-) hoarseness. Neck: (-) swollen glands, (-) enlarged thyroid, (-) neck pain. Respiratory: - cough, (-) bloody sputum, - shortness of breath, - wheezing. Cardiovascular: - ankle swelling, (-) chest pain. Lymphatic: (-) lymph node enlargement. Neurologic: (-) numbness, (-) tingling. Psychiatric: (-) anxiety, (-) depression   Current Medication: Outpatient Encounter Medications as of 06/27/2019  Medication Sig  . amLODipine (NORVASC) 5 MG tablet Take 10 mg by mouth daily.   Marland Kitchen. aspirin EC 81 MG tablet Take 81 mg by mouth daily.  Marland Kitchen. atorvastatin (LIPITOR) 80 MG tablet Take 80 mg by mouth daily.   . brimonidine (ALPHAGAN) 0.2 % ophthalmic solution Place 1 drop into both eyes 2 (two) times daily.   . candesartan (ATACAND) 32 MG tablet Take by mouth daily.   . carvedilol (COREG) 25 MG tablet Take by mouth 2 (two) times daily with a meal.   . conjugated estrogens (PREMARIN) vaginal cream Place 1 Applicatorful vaginally daily. Apply 0.5mg  (pea-sized amount)  just inside the vaginal introitus with a finger-tip every night for two weeks  and then Monday, Wednesday and Friday nights.  . cyclobenzaprine (FEXMID) 7.5 MG tablet Take 1 tablet (7.5 mg total) by mouth 2 (two) times daily as needed for muscle spasms.  Marland Kitchen. donepezil (ARICEPT) 10 MG tablet Take 10 mg by mouth at bedtime.  . ferrous sulfate 325 (65 FE) MG tablet Take by mouth. Takes one pill every 2-3 days to minimize constipation  . fesoterodine (TOVIAZ) 8 MG TB24 tablet Take 1 tablet (8 mg total) by mouth daily.  . magnesium oxide (MAG-OX) 400 MG tablet Take 400 mg by mouth daily.   . metFORMIN (GLUCOPHAGE) 500 MG tablet Take 1,000 mg by mouth 2 (two) times daily with a meal.  . Multiple Vitamin (MULTI-VITAMINS) TABS Take 1 tablet by mouth daily.   . Omega-3 Fatty Acids (FISH OIL) 1000 MG CAPS Take 1,000 mg by mouth daily.  . pantoprazole (PROTONIX) 40 MG tablet Take 40 mg by mouth daily.   . travoprost, benzalkonium, (TRAVATAN) 0.004 % ophthalmic solution Place 1 drop into both eyes at bedtime.   . tretinoin (RETIN-A) 0.025 % cream Apply 1 application topically at bedtime.   No facility-administered encounter medications on file as of 06/27/2019.     Surgical History: Past Surgical History:  Procedure Laterality Date  . ABDOMINAL HYSTERECTOMY    . COLONOSCOPY    . COLONOSCOPY N/A 04/01/2018   Procedure: COLONOSCOPY;  Surgeon: Christena DeemSkulskie, Martin U, MD;  [facility];  Service: Endoscopy;  Laterality: N/A;  . COLONOSCOPY WITH PROPOFOL N/A 12/29/2017   Procedure: COLONOSCOPY WITH PROPOFOL;  Surgeon: Marva PandaSkulskie,  Cindra EvesMartin U, MD;  Location: [facility] ENDOSCOPY;  Service: Endoscopy;  Laterality: N/A;  . ESOPHAGOGASTRODUODENOSCOPY (EGD) WITH PROPOFOL N/A 12/29/2017   Procedure: ESOPHAGOGASTRODUODENOSCOPY (EGD) WITH PROPOFOL;  Surgeon: Christena DeemSkulskie, Martin U, MD;  [facility];  Service: Endoscopy;  Laterality: N/A;  . ESOPHAGOGASTRODUODENOSCOPY (EGD) WITH PROPOFOL N/A 04/01/2018   Procedure: ESOPHAGOGASTRODUODENOSCOPY (EGD) WITH PROPOFOL;  Surgeon: Christena DeemSkulskie, Martin U,  MD;  [facility];  Service: Endoscopy;  Laterality: N/A;  . EUS N/A 05/20/2018   Procedure: ESOPHAGEAL ENDOSCOPIC ULTRASOUND (EUS) RADIAL;  Surgeon: Bearl MulberryBurbridge, Rebecca A, MD;  [facility];  Service: Gastroenterology;  Laterality: N/A;    Medical History: Past Medical History:  Diagnosis Date  . Anemia   . Cardiomyopathy (HCC)    Ejection Fraction 30-35% per ECHO 2016  . CHF (congestive heart failure) (HCC)   . Diabetes (HCC)   . Dysrhythmia   . Hyperlipidemia   . Hypertension   . Peripheral neuropathy   . Peripheral neuropathy   . Sleep apnea     Family History: Family History  Problem Relation Age of Onset  . Diabetes Mother   . Heart disease Mother   . Bladder Cancer Neg Hx   . Kidney cancer Neg Hx     Social History: Social History   Socioeconomic History  . Marital status: Widowed    Spouse name: Not on file  . Number of children: Not on file  . Years of education: Not on file  . Highest education level: Not on file  Occupational History  . Not on file  Social Needs  . Financial resource strain: Not very hard  . Food insecurity    Worry: Never true    Inability: Never true  . Transportation needs    Medical: No    Non-medical: No  Tobacco Use  . Smoking status: Never Smoker  . Smokeless tobacco: Never Used  Substance and Sexual Activity  . Alcohol use: No  . Drug use: No  . Sexual activity: Yes    Birth control/protection: Post-menopausal  Lifestyle  . Physical activity    Days per week: 0 days    Minutes per session: 0 min  . Stress: Not at all  Relationships  . Social connections    Talks on phone: More than three times a week    Gets together: Once a week    Attends religious service: More than 4 times per year    Active member of club or organization: Yes    Attends meetings of clubs or organizations: More than 4 times per year    Relationship status: Widowed  . Intimate partner violence    Fear of current or ex  partner: Patient refused    Emotionally abused: Patient refused    Physically abused: Patient refused    Forced sexual activity: Patient refused  Other Topics Concern  . Not on file  Social History Narrative  . Not on file    Vital Signs: Blood pressure 130/86, pulse 73, resp. rate 16, height 5\' 4"  (1.626 m), weight 228 lb (103.4 kg), SpO2 96 %.  Examination: General Appearance: The patient is well-developed, well-nourished, and in no distress. Skin: Gross inspection of skin unremarkable. Head: normocephalic, no gross deformities. Eyes: no gross deformities noted. ENT: ears appear grossly normal no exudates. Neck: Supple. No thyromegaly. No LAD. Respiratory: clear bilateraly. Cardiovascular: Normal S1 and S2 without murmur or rub. Extremities: No cyanosis. pulses are equal. Neurologic: Alert and oriented. No involuntary movements.  LABS: Recent Results (  from the past 2160 hour(s))  I-STAT creatinine     Status: Abnormal   Collection Time: 05/06/19 10:56 AM  Result Value Ref Range   Creatinine, Ser 1.50 (H) 0.44 - 1.00 mg/dL    Radiology: Mr Abdomen W Wo Contrast  Result Date: 05/06/2019 CLINICAL DATA:  Small enhancing lesion in the right kidney. EXAM: MRI ABDOMEN WITHOUT AND WITH CONTRAST TECHNIQUE: Multiplanar multisequence MR imaging of the abdomen was performed both before and after the administration of intravenous contrast. CONTRAST:  10 cc Gadavist COMPARISON:  07/16/2018 FINDINGS: Lower chest: Unremarkable. Hepatobiliary: 1.5 cm lesion in the dome of the liver (image 9/series 9) is stable in the interval and again shows diffuse enhancement after IV contrast administration. No other focal enhancing liver lesion evident. There is no evidence for gallstones, gallbladder wall thickening, or pericholecystic fluid. No intrahepatic or extrahepatic biliary dilation. Pancreas: No focal mass lesion. No dilatation of the main duct. No intraparenchymal cyst. No peripancreatic edema.  Spleen:  No splenomegaly. No focal mass lesion. Adrenals/Urinary Tract: No adrenal nodule or mass. As noted previously, multiple Bosniak I and Bosniak II cysts of varying size are identified in the kidneys. Dominant cyst in the left kidney is a 2.5 cm Bosniak II upper pole lesion. Central sinus cysts also noted left kidney. 10 mm subcapsular mildly exophytic lesion in the upper interpolar region of the right kidney (postcontrast image 66/series 13) shows heterogeneous enhancement after IV contrast administration. Stomach/Bowel: Stomach is unremarkable. No gastric wall thickening. No evidence of outlet obstruction. Duodenum is normally positioned as is the ligament of Treitz. No small bowel or colonic dilatation within the visualized abdomen. Vascular/Lymphatic: No abdominal aortic aneurysm. No abdominal lymphadenopathy Other:  No intraperitoneal free fluid. Musculoskeletal: No abnormal marrow enhancement within the visualized bony anatomy. Probable tiny hemangioma in the T11 vertebral body. IMPRESSION: 1. Stable 10 mm enhancing lesion interpolar region of the right kidney, compatible with tiny renal cell carcinoma. 2. Bilateral Bosniak I and Bosniak II cysts of varying size in the kidneys. 3. Stable 1.5 cm enhancing lesion in the dome of liver, likely benign. Electronically Signed   By: Kennith CenterEric  Mansell M.D.   On: 05/06/2019 13:18    No results found.  No results found.    Assessment and Plan: Patient Active Problem List   Diagnosis Date Noted  . Lymphedema 09/03/2018  . Chronic systolic heart failure (HCC) 03/08/2018  . Encounter for dental exam and cleaning w/o abnormal findings 01/14/2018  . High risk medication use 12/11/2015  . Left lumbar radiculopathy 12/05/2014  . Anemia 06/12/2014  . Cardiomyopathy (HCC) 06/12/2014  . Benign essential hypertension 06/12/2014  . Hyperlipidemia 06/12/2014  . Obstructive sleep apnea 06/12/2014  . Peripheral neuropathy 06/12/2014  . Proteinuria 06/12/2014   . 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HCC) 06/12/2014  . 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 Vitamin B 12 deficiency 06/12/2014  . Vitamin D deficiency 06/12/2014    1. OSA on CPAP Doing well, continue to use cpap machine when sleeping. She has excellent compliance and her settings appear to be sufficient.  She reports feeling much better since beginging therapy.    2. Chronic systolic heart failure (HCC) Stable, continue to follow up with heart failure clinic as scheduled next month.   3. Morbid obesity (HCC) Obesity Counseling: Risk Assessment: An assessment of behavioral risk factors was made today and includes lack of exercise sedentary lifestyle, lack of portion control and poor dietary habits.  Risk Modification Advice: She was counseled on portion control guidelines. Restricting daily caloric intake to. . The detrimental long term effects of obesity on her health and ongoing poor compliance was also discussed with the patient.    General Counseling: I have discussed the findings of the evaluation and examination with Barnetta Chapel.  I have also discussed any further diagnostic evaluation thatmay be needed or ordered today. Hasset verbalizes understanding of the findings of todays visit. We also reviewed her medications today and discussed drug interactions and side effects including but not limited excessive drowsiness and altered mental states. We also discussed that there is always a risk not just to her but also people around her. she has been encouraged to call the office with any questions or concerns that should arise related to todays visit.    Time spent: 20 This patient was seen by Orson Gear AGNP-C in Collaboration with Dr. Devona Konig as a part of collaborative care agreement.  I have personally obtained a history, examined the patient, evaluated laboratory and imaging results, formulated the assessment  and plan and placed orders.    Allyne Gee, MD [facility] Pulmonary and Critical Care Sleep medicine

## 2019-07-11 NOTE — Progress Notes (Deleted)
Patient ID: Shelby PilotCatherine Brown [name], female    DOB: 1940/12/29, 78 y.o.   MRN: [id number]  HPI  Shelby [name] is a 78 y/o female with a history of DM, hyperlipidemia, HTN, anemia, obstructive sleep apnea, neuropathy and chronic heart failure.   Echo report from 02/13/15 reviewed and showed an EF of 35% along with moderate MR and mild PHTN. Function study on 03/16/15 showed an EF of 52%.   Was in the ED 01/15/2019 due to right upper back pain. CT angiogram was done and she was released. Was in the ED 11/19/18 due to peripheral edema where she was treated and released.     She presents today for a follow-up visit with a chief complaint of    Past Medical History:  Diagnosis Date  . Anemia   . Cardiomyopathy (HCC)    Ejection Fraction 30-35% per ECHO 2016  . CHF (congestive heart failure) (HCC)   . Diabetes (HCC)   . Dysrhythmia   . Hyperlipidemia   . Hypertension   . Peripheral neuropathy   . Peripheral neuropathy   . Sleep apnea    Past Surgical History:  Procedure Laterality Date  . ABDOMINAL HYSTERECTOMY    . COLONOSCOPY    . COLONOSCOPY N/A 04/01/2018   Procedure: COLONOSCOPY;  Surgeon: Christena DeemSkulskie, Martin U, MD;  [facility];  Service: Endoscopy;  Laterality: N/A;  . COLONOSCOPY WITH PROPOFOL N/A 12/29/2017   Procedure: COLONOSCOPY WITH PROPOFOL;  Surgeon: Christena DeemSkulskie, Martin U, MD;  [facility];  Service: Endoscopy;  Laterality: N/A;  . ESOPHAGOGASTRODUODENOSCOPY (EGD) WITH PROPOFOL N/A 12/29/2017   Procedure: ESOPHAGOGASTRODUODENOSCOPY (EGD) WITH PROPOFOL;  Surgeon: Christena DeemSkulskie, Martin U, MD;  [facility];  Service: Endoscopy;  Laterality: N/A;  . ESOPHAGOGASTRODUODENOSCOPY (EGD) WITH PROPOFOL N/A 04/01/2018   Procedure: ESOPHAGOGASTRODUODENOSCOPY (EGD) WITH PROPOFOL;  Surgeon: Christena DeemSkulskie, Martin U, MD;  [facility];  Service: Endoscopy;  Laterality: N/A;  . EUS N/A 05/20/2018   Procedure: ESOPHAGEAL ENDOSCOPIC ULTRASOUND (EUS) RADIAL;  Surgeon:  Bearl MulberryBurbridge, Rebecca A, MD;  [facility];  Service: Gastroenterology;  Laterality: N/A;   Family History  Problem Relation Age of Onset  . Diabetes Mother   . Heart disease Mother   . Bladder Cancer Neg Hx   . Kidney cancer Neg Hx    Social History   Tobacco Use  . Smoking status: Never Smoker  . Smokeless tobacco: Never Used  Substance Use Topics  . Alcohol use: No   Allergies  Allergen Reactions  . Mirabegron Other (See Comments)    Elevated BP  . Furosemide Other (See Comments)    Legs swelling      Review of Systems  Constitutional: Negative for appetite change and fatigue.  HENT: Negative for congestion, postnasal drip and sore throat.   Eyes: Negative.   Respiratory: Negative for cough, chest tightness and shortness of breath.   Cardiovascular: Positive for leg swelling. Negative for chest pain and palpitations.  Gastrointestinal: Negative for abdominal distention and abdominal pain.  Endocrine: Negative.   Genitourinary: Negative.   Musculoskeletal: Positive for back pain (when walking long distances). Negative for arthralgias and neck pain.  Skin: Negative.        Itching lower legs  Allergic/Immunologic: Negative.   Neurological: Positive for dizziness (first thing in the morning) and numbness (tingling in feet due to neuropathy). Negative for light-headedness.       Leg cramping at times  Hematological: Negative for adenopathy. Does not bruise/bleed easily.  Psychiatric/Behavioral: Negative for dysphoric mood  and sleep disturbance. The patient is not nervous/anxious.      Physical Exam  Constitutional: She is oriented to person, place, and time. She appears well-developed and well-nourished.  HENT:  Head: Normocephalic and atraumatic.  Neck: Normal range of motion. Neck supple. No JVD present.  Cardiovascular: Normal rate and regular rhythm.  Pulmonary/Chest: Effort normal. She has no wheezes. She has no rales.  Abdominal: Soft. She exhibits  no distension. There is no abdominal tenderness.  Musculoskeletal:        General: Edema (trace pitting edema ) present. No tenderness.  Neurological: She is alert and oriented to person, place, and time.  Skin: Skin is warm and dry.  Psychiatric: She has a normal mood and affect. Her behavior is normal. Thought content normal.  Nursing note and vitals reviewed.  Assessment & Plan:  1: Chronic heart failure with reduced ejection fraction- - NYHA class I - euvolemic today - weighing daily. Reminded to call for an overnight weight gain of >2 pounds or a weekly weight gain of >5 pounds - weight 232.2 pounds from last visit 5 months ago - trying to not add salt; reviewed the importance of closely following a 2000mg  sodium diet - drinks ~32 ounces of water daily along with a a soda or tea - saw cardiology (Fath) 06/09/18   - BNP 11/19/18 was 25.0  - needs update echo   2: HTN- - BP - saw PCP (Thies) 12/09/2018 - BMP from 01/15/2019 reviewed and showed sodium 139, potassium 4.2, creatinine 1.11 and GFR 56  3: Diabetes- - fasting glucose has been "good" per her report - A1c on 07/27/18 was 6.5%  4: Obstructive sleep apnea- - last sleep study was July 2019 - saw pulmonology Humphrey Rolls) 12/20/2018 - continues to sleep well wearing her CPAP  5: Lymphedema- - stage 2 - now wearing compression boots twice daily for about an hour each time - reports that edema is much improved since wearing the boots  Patient did not bring her medications nor a list. Each medication was verbally reviewed with the patient and she was encouraged to bring the bottles to every visit to confirm accuracy of list.

## 2019-07-12 ENCOUNTER — Ambulatory Visit: Admitting: Family

## 2019-07-14 NOTE — Progress Notes (Signed)
Patient ID: Shelby [name], female    DOB: 10-03-1941, 78 y.o.   MRN: [id number]  HPI  Shelby [name] is a 78 y/o female with a history of DM, hyperlipidemia, HTN, anemia, obstructive sleep apnea, neuropathy and chronic heart failure.   Echo report from 02/13/15 reviewed and showed an EF of 35% along with moderate MR and mild PHTN. Function study on 03/16/15 showed an EF of 52%.   Was in the ED 01/15/2019 due to right upper back pain. CT angiogram was done and she was released. Was in the ED 11/19/18 due to peripheral edema where she was treated and released.     She presents today for a follow-up visit with a chief complaint of intermittent dizziness. She has associated back pain along with this. She denies any difficulty sleeping, abdominal distention, palpitations, pedal edema, chest pain, shortness of breath, cough, fatigue or weight gain.    Past Medical History:  Diagnosis Date  . Anemia   . Cardiomyopathy ([place])    Ejection Fraction 30-35% per ECHO 2016  . CHF (congestive heart failure) ([place])   . Diabetes ([place])   . Dysrhythmia   . Hyperlipidemia   . Hypertension   . Peripheral neuropathy   . Peripheral neuropathy   . Sleep apnea    Past Surgical History:  Procedure Laterality Date  . ABDOMINAL HYSTERECTOMY    . COLONOSCOPY    . COLONOSCOPY N/A 04/01/2018   Procedure: COLONOSCOPY;  Surgeon: Lollie Sails, MD;  Location: [facility] ENDOSCOPY;  Service: Endoscopy;  Laterality: N/A;  . COLONOSCOPY WITH PROPOFOL N/A 12/29/2017   Procedure: COLONOSCOPY WITH PROPOFOL;  Surgeon: Lollie Sails, MD;  Location: [facility] ENDOSCOPY;  Service: Endoscopy;  Laterality: N/A;  . ESOPHAGOGASTRODUODENOSCOPY (EGD) WITH PROPOFOL N/A 12/29/2017   Procedure: ESOPHAGOGASTRODUODENOSCOPY (EGD) WITH PROPOFOL;  Surgeon: Lollie Sails, MD;  Location: [facility] ENDOSCOPY;  Service: Endoscopy;  Laterality: N/A;  . ESOPHAGOGASTRODUODENOSCOPY (EGD) WITH PROPOFOL N/A 04/01/2018   Procedure:  ESOPHAGOGASTRODUODENOSCOPY (EGD) WITH PROPOFOL;  Surgeon: Lollie Sails, MD;  Location: [facility] ENDOSCOPY;  Service: Endoscopy;  Laterality: N/A;  . EUS N/A 05/20/2018   Procedure: ESOPHAGEAL ENDOSCOPIC ULTRASOUND (EUS) RADIAL;  Surgeon: Holly Bodily, MD;  [facility] ENDOSCOPY;  Service: Gastroenterology;  Laterality: N/A;   Family History  Problem Relation Age of Onset  . Diabetes Mother   . Heart disease Mother   . Bladder Cancer Neg Hx   . Kidney cancer Neg Hx    Social History   Tobacco Use  . Smoking status: Never Smoker  . Smokeless tobacco: Never Used  Substance Use Topics  . Alcohol use: No   Allergies  Allergen Reactions  . Mirabegron Other (See Comments)    Elevated BP  . Furosemide Other (See Comments)    Legs swelling    Prior to Admission medications   Medication Sig Start Date End Date Taking? Authorizing Provider  amLODipine (NORVASC) 5 MG tablet Take 10 mg by mouth daily.  12/11/15  Yes [provider]  aspirin EC 81 MG tablet Take 81 mg by mouth daily.   Yes [provider]  atorvastatin (LIPITOR) 80 MG tablet Take 80 mg by mouth daily.  12/11/15  Yes [provider]  brimonidine (ALPHAGAN) 0.2 % ophthalmic solution Place 1 drop into both eyes 2 (two) times daily.    Yes [provider]  candesartan (ATACAND) 32 MG tablet Take by mouth daily.  07/15/16  Yes [provider]  carvedilol (COREG) 25 MG tablet  Take by mouth 2 (two) times daily with a meal.  07/15/16  Yes [provider]  conjugated estrogens (PREMARIN) vaginal cream Place 1 Applicatorful vaginally daily. Apply 0.5mg  (pea-sized amount)  just inside the vaginal introitus with a finger-tip every night for two weeks and then Monday, Wednesday and Friday nights. 03/11/18  Yes McGowan, Carollee HerterShannon A, PA-C  cyclobenzaprine (FEXMID) 7.5 MG tablet Take 1 tablet (7.5 mg total) by mouth 2 (two) times daily as needed for muscle spasms. 01/15/19  Yes Sharyn CreamerQuale, Mark,  MD  donepezil (ARICEPT) 10 MG tablet Take 10 mg by mouth at bedtime.   Yes [provider]  ferrous sulfate 325 (65 FE) MG tablet Take by mouth. Takes one pill every 2-3 days to minimize constipation   Yes [provider]  fesoterodine (TOVIAZ) 8 MG TB24 tablet Take 1 tablet (8 mg total) by mouth daily. 03/11/18  Yes McGowan, Carollee HerterShannon A, PA-C  magnesium oxide (MAG-OX) 400 MG tablet Take 400 mg by mouth daily.    Yes [provider]  metFORMIN (GLUCOPHAGE) 500 MG tablet Take 1,000 mg by mouth 2 (two) times daily with a meal.   Yes [provider]  Multiple Vitamin (MULTI-VITAMINS) TABS Take 1 tablet by mouth daily.    Yes [provider]  NON FORMULARY cpap device   Yes [provider]  Omega-3 Fatty Acids (FISH OIL) 1000 MG CAPS Take 1,000 mg by mouth daily.   Yes [provider]  pantoprazole (PROTONIX) 40 MG tablet Take 40 mg by mouth daily.    Yes [provider]  travoprost, benzalkonium, (TRAVATAN) 0.004 % ophthalmic solution Place 1 drop into both eyes at bedtime.    Yes [provider]  tretinoin (RETIN-A) 0.025 % cream Apply 1 application topically at bedtime.   Yes [provider]    Review of Systems  Constitutional: Negative for appetite change and fatigue.  HENT: Negative for congestion, postnasal drip and sore throat.   Eyes: Negative.   Respiratory: Negative for cough, chest tightness and shortness of breath.   Cardiovascular: Negative for chest pain, palpitations and leg swelling.  Gastrointestinal: Negative for abdominal distention and abdominal pain.  Endocrine: Negative.   Genitourinary: Negative.   Musculoskeletal: Positive for back pain (right hip pain). Negative for arthralgias and neck pain.  Skin: Negative.   Allergic/Immunologic: Negative.   Neurological: Positive for dizziness (first thing in the morning) and numbness (tingling in feet due to neuropathy). Negative for  light-headedness.  Hematological: Negative for adenopathy. Does not bruise/bleed easily.  Psychiatric/Behavioral: Negative for dysphoric mood and sleep disturbance. The patient is not nervous/anxious.    Vitals:   07/15/19 1135  BP: (!) 146/80  Pulse: 73  Resp: 18  SpO2: 99%  Weight: 227 lb (103 kg)  Height: 5\' 4"  (1.626 m)   Wt Readings from Last 3 Encounters:  07/15/19 227 lb (103 kg)  06/27/19 228 lb (103.4 kg)  04/11/19 226 lb (102.5 kg)   Lab Results  Component Value Date   CREATININE 1.50 (H) 05/06/2019   CREATININE 1.11 (H) 01/15/2019   CREATININE 1.42 (H) 11/19/2018    Physical Exam  Constitutional: She is oriented to person, place, and time. She appears well-developed and well-nourished.  HENT:  Head: Normocephalic and atraumatic.  Neck: Normal range of motion. Neck supple. No JVD present.  Cardiovascular: Normal rate and regular rhythm.  Pulmonary/Chest: Effort normal. She has no wheezes. She has no rales.  Abdominal: Soft. She exhibits no distension. There is no  abdominal tenderness.  Musculoskeletal:        General: No tenderness or edema.  Neurological: She is alert and oriented to person, place, and time.  Skin: Skin is warm and dry.  Psychiatric: She has a normal mood and affect. Her behavior is normal. Thought content normal.  Nursing note and vitals reviewed.  Assessment & Plan:  1: Chronic heart failure with reduced ejection fraction- - NYHA class I - euvolemic today - weighing daily. Reminded to call for an overnight weight gain of >2 pounds or a weekly weight gain of >5 pounds - weight down 5 pounds from last visit 5 months ago - trying to not add salt; reviewed the importance of closely following a 2000mg  sodium diet - drinks ~32 ounces of water daily along with a a soda or tea - saw cardiology (Fath) 06/09/18  - BNP 11/19/18 was 25.0 - echo has been scheduled for 07/25/2019  2: HTN- - BP mildly elevated today - saw PCP Harrington Challenger([place]) 06/15/2019 - BMP  from 06/15/2019 reviewed and showed sodium 141, potassium 4.3, creatinine 1.2 and GFR 93  3: Diabetes- - fasting glucose has been "good" per her report - A1c on 06/15/2019 was 6.4%  4: Obstructive sleep apnea- - last sleep study was July 2019 - saw pulmonology Welton Flakes([place]) 12/20/2018 - continues to sleep well wearing her CPAP  5: Lymphedema- - stage 2 - now wearing compression boots daily for about an hour  - edema has now resolved  Patient did not bring her medications nor a list. Each medication was verbally reviewed with the patient and she was encouraged to bring the bottles to every visit to confirm accuracy of list.   Return in 3 months or sooner for any questions/problems before then.

## 2019-07-15 ENCOUNTER — Other Ambulatory Visit: Payer: Self-pay

## 2019-07-15 ENCOUNTER — Ambulatory Visit: Payer: Medicare Other | Attending: Family | Admitting: Family

## 2019-07-15 ENCOUNTER — Encounter: Payer: Self-pay | Admitting: Family

## 2019-07-15 VITALS — BP 146/80 | HR 73 | Resp 18 | Ht 64.0 in | Wt 227.0 lb

## 2019-07-15 DIAGNOSIS — E1129 Type 2 diabetes mellitus with other diabetic kidney complication: Secondary | ICD-10-CM

## 2019-07-15 DIAGNOSIS — I5022 Chronic systolic (congestive) heart failure: Secondary | ICD-10-CM

## 2019-07-15 DIAGNOSIS — I10 Essential (primary) hypertension: Secondary | ICD-10-CM

## 2019-07-15 DIAGNOSIS — G4733 Obstructive sleep apnea (adult) (pediatric): Secondary | ICD-10-CM

## 2019-07-15 DIAGNOSIS — I890 Lymphedema, not elsewhere classified: Secondary | ICD-10-CM

## 2019-07-15 NOTE — Patient Instructions (Signed)
Continue weighing daily and call for an overnight weight gain of > 2 pounds or a weekly weight gain of >5 pounds. 

## 2019-07-20 ENCOUNTER — Telehealth: Payer: Self-pay

## 2019-07-20 NOTE — Telephone Encounter (Signed)
-----   Message from Hollice Espy, MD sent at 07/19/2019  2:03 PM EDT ----- Cleaning out inbasket, looks like this patient fell through the cracks.  Her MRI got done but she never had a follow-up appointment.  Please schedule this with me.  It can also be virtual if preferred.    Hollice Espy, MD

## 2019-07-20 NOTE — Telephone Encounter (Signed)
Please schedule. thanks

## 2019-07-25 ENCOUNTER — Ambulatory Visit
Admission: RE | Admit: 2019-07-25 | Discharge: 2019-07-25 | Disposition: A | Discharge status: Home / Self Care | Payer: Medicare Other | Source: Ambulatory Visit | Attending: Family | Admitting: Family

## 2019-07-25 ENCOUNTER — Other Ambulatory Visit: Payer: Self-pay

## 2019-07-25 DIAGNOSIS — I110 Hypertensive heart disease with heart failure: Secondary | ICD-10-CM | POA: Diagnosis not present

## 2019-07-25 DIAGNOSIS — I429 Cardiomyopathy, unspecified: Secondary | ICD-10-CM | POA: Insufficient documentation

## 2019-07-25 DIAGNOSIS — I5022 Chronic systolic (congestive) heart failure: Secondary | ICD-10-CM | POA: Insufficient documentation

## 2019-07-25 DIAGNOSIS — E119 Type 2 diabetes mellitus without complications: Secondary | ICD-10-CM | POA: Diagnosis not present

## 2019-07-25 DIAGNOSIS — E785 Hyperlipidemia, unspecified: Secondary | ICD-10-CM | POA: Insufficient documentation

## 2019-07-25 NOTE — Progress Notes (Signed)
*  PRELIMINARY RESULTS* Echocardiogram 2D Echocardiogram has been performed.  Shelby [name] 07/25/2019, 11:46 AM

## 2019-08-17 ENCOUNTER — Other Ambulatory Visit: Payer: Self-pay

## 2019-08-17 ENCOUNTER — Ambulatory Visit (INDEPENDENT_AMBULATORY_CARE_PROVIDER_SITE_OTHER): Payer: Medicare Other

## 2019-08-17 DIAGNOSIS — G4733 Obstructive sleep apnea (adult) (pediatric): Secondary | ICD-10-CM | POA: Diagnosis not present

## 2019-08-17 NOTE — Progress Notes (Signed)
95 percentile pressure 12   95th percentile leak 4.6   apnea index 5.0 /hr  apnea-hypopnea index  6.6 /hr   total days used  >4 hr 21 days  total days used <4 hr 2 days  Total compliance 70 percent  Patient stated cpap not coming on. It worked fine in office. I believe was power cord. She will call if any other problems comes up.

## 2019-08-26 ENCOUNTER — Other Ambulatory Visit: Payer: Self-pay

## 2019-08-26 ENCOUNTER — Encounter: Payer: Self-pay | Admitting: Urology

## 2019-08-26 ENCOUNTER — Ambulatory Visit (INDEPENDENT_AMBULATORY_CARE_PROVIDER_SITE_OTHER): Payer: Medicare Other | Admitting: Urology

## 2019-08-26 DIAGNOSIS — N2889 Other specified disorders of kidney and ureter: Secondary | ICD-10-CM | POA: Diagnosis not present

## 2019-08-26 NOTE — Progress Notes (Signed)
08/26/2019 3:38 PM   Shelby [name] 1941/05/12 [id number]  Referring provider: Mickey Farberhies, David, MD [address] [facility] [place] [id number]  Chief Complaint  Patient presents with  . Renal Mass    follow up    HPI: 78 year old female who returns today for follow-up MRI of her renal mass.  She was scheduled for MRI back in April but missed this follow-up.  It was rescheduled and ultimately completed on 05/06/2019.  She however failed to follow-up until now to discuss these results.  She was initially seen and evaluated for right ureteral calculus which she subsequently passed.  On this imaging, she did have multiple indeterminate renal lesions.  She underwent further evaluation in the form of MRI of the abdomen with and without contrast for further characterization.  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  No lymphadenopathy or vein involvement.  Repeat MRI in 04/2019 shows essentially stable 10 mm enhancing renal lesion in the interpolar right kidney.  She has Bosniak 1 and 2 renal cyst in addition to this.  She also has a stable 1.5 cm enhancing lesion at the dome of the bladder which is felt to be likely benign.  Overall, she is doing fair.  She has multiple medical comorbidities as listed below.  She is been no significant change in her medical history.  No flank pain or gross hematuria.  No weight loss.  PMH: Past Medical History:  Diagnosis Date  . Anemia   . Cardiomyopathy ([facility])    Ejection Fraction 30-35% per ECHO 2016  . CHF (congestive heart failure) ([facility])   . Diabetes ([facility])   . Dysrhythmia   . Hyperlipidemia   . Hypertension   . Peripheral neuropathy   . Peripheral neuropathy   . Sleep apnea     Surgical History: Past  Surgical History:  Procedure Laterality Date  . ABDOMINAL HYSTERECTOMY    . COLONOSCOPY    . COLONOSCOPY N/A 04/01/2018   Procedure: COLONOSCOPY;  Surgeon: Christena DeemSkulskie, Martin U, MD;  [facility];  Service: Endoscopy;  Laterality: N/A;  . COLONOSCOPY WITH PROPOFOL N/A 12/29/2017   Procedure: COLONOSCOPY WITH PROPOFOL;  Surgeon: Christena DeemSkulskie, Martin U, MD;  [facility];  Service: Endoscopy;  Laterality: N/A;  . ESOPHAGOGASTRODUODENOSCOPY (EGD) WITH PROPOFOL N/A 12/29/2017   Procedure: ESOPHAGOGASTRODUODENOSCOPY (EGD) WITH PROPOFOL;  Surgeon: Christena DeemSkulskie, Martin U, MD;  [facility];  Service: Endoscopy;  Laterality: N/A;  . ESOPHAGOGASTRODUODENOSCOPY (EGD) WITH PROPOFOL N/A 04/01/2018   Procedure: ESOPHAGOGASTRODUODENOSCOPY (EGD) WITH PROPOFOL;  Surgeon: Christena DeemSkulskie, Martin U, MD;  [facility];  Service: Endoscopy;  Laterality: N/A;  . EUS N/A 05/20/2018   Procedure: ESOPHAGEAL ENDOSCOPIC ULTRASOUND (EUS) RADIAL;  Surgeon: Bearl MulberryBurbridge, Rebecca A, MD;  [facility];  Service: Gastroenterology;  Laterality: N/A;    Home Medications:  Allergies as of 08/26/2019      Reactions   Mirabegron Other (See Comments)   Elevated BP   Furosemide Other (See Comments)   Legs swelling      Medication List       Accurate as of August 26, 2019 11:59 PM. If you have any questions, ask your nurse or doctor.        amLODipine 5 MG tablet Commonly known as: NORVASC Take 10 mg by mouth daily.   aspirin  EC 81 MG tablet Take 81 mg by mouth daily.   atorvastatin 80 MG tablet Commonly known as: LIPITOR Take 80 mg by mouth daily.   brimonidine 0.2 % ophthalmic solution Commonly known as: ALPHAGAN Place 1 drop into both eyes 2 (two) times daily.   candesartan 32 MG tablet Commonly known as: ATACAND Take by mouth daily.   carvedilol 25 MG tablet Commonly known as: COREG Take by mouth 2 (two) times daily with a meal.   conjugated estrogens vaginal  cream Commonly known as: Premarin Place 1 Applicatorful vaginally daily. Apply 0.5mg  (pea-sized amount)  just inside the vaginal introitus with a finger-tip every night for two weeks and then Monday, Wednesday and Friday nights.   cyclobenzaprine 7.5 MG tablet Commonly known as: FEXMID Take 1 tablet (7.5 mg total) by mouth 2 (two) times daily as needed for muscle spasms.   donepezil 10 MG tablet Commonly known as: ARICEPT Take 10 mg by mouth at bedtime.   ferrous sulfate 325 (65 FE) MG tablet Take by mouth. Takes one pill every 2-3 days to minimize constipation   fesoterodine 8 MG Tb24 tablet Commonly known as: TOVIAZ Take 1 tablet (8 mg total) by mouth daily.   Fish Oil 1000 MG Caps Take 1,000 mg by mouth daily.   magnesium oxide 400 MG tablet Commonly known as: MAG-OX Take 400 mg by mouth daily.   metFORMIN 500 MG tablet Commonly known as: GLUCOPHAGE Take 1,000 mg by mouth 2 (two) times daily with a meal.   Multi-Vitamins Tabs Take 1 tablet by mouth daily.   NON FORMULARY cpap device   pantoprazole 40 MG tablet Commonly known as: PROTONIX Take 40 mg by mouth daily.   travoprost (benzalkonium) 0.004 % ophthalmic solution Commonly known as: TRAVATAN Place 1 drop into both eyes at bedtime.   tretinoin 0.025 % cream Commonly known as: RETIN-A Apply 1 application topically at bedtime.       Allergies:  Allergies  Allergen Reactions  . Mirabegron Other (See Comments)    Elevated BP  . Furosemide Other (See Comments)    Legs swelling     Family History: Family History  Problem Relation Age of Onset  . Diabetes Mother   . Heart disease Mother   . Bladder Cancer Neg Hx   . Kidney cancer Neg Hx     Social History:  reports that she has never smoked. She has never used smokeless tobacco. She reports that she does not drink alcohol or use drugs.  ROS: UROLOGY Frequent Urination?: Yes Hard to postpone urination?: No Burning/pain with urination?: No  Get up at night to urinate?: Yes Leakage of urine?: Yes Urine stream starts and stops?: No Trouble starting stream?: No Do you have to strain to urinate?: No Blood in urine?: No Urinary tract infection?: No Sexually transmitted disease?: No Injury to kidneys or bladder?: No Painful intercourse?: No Weak stream?: No Currently pregnant?: No Vaginal bleeding?: No Last menstrual period?: n  Gastrointestinal Nausea?: No Vomiting?: No Indigestion/heartburn?: No Diarrhea?: No Constipation?: No  Constitutional Fever: No Night sweats?: Yes Weight loss?: No Fatigue?: No  Skin Skin rash/lesions?: No Itching?: Yes  Eyes Blurred vision?: No Double vision?: No  Ears/Nose/Throat Sore throat?: No Sinus problems?: Yes  Hematologic/Lymphatic Swollen glands?: No Easy bruising?: No  Cardiovascular Leg swelling?: No Chest pain?: No  Respiratory Cough?: Yes Shortness of breath?: No  Endocrine Excessive thirst?: No  Musculoskeletal Back pain?: Yes Joint pain?: Yes  Neurological Headaches?: No Dizziness?: No  Psychologic  Depression?: Yes Anxiety?: No  Physical Exam: BP (!) 102/57   Pulse 82   Ht 5\' 4"  (1.626 m)   Wt 230 lb (104.3 kg)   BMI 39.48 kg/m   Constitutional:  Alert and oriented, No acute distress.  Pleasant. HEENT: [place] AT, moist mucus membranes.  Trachea midline, no masses. Cardiovascular: No clubbing, cyanosis, or edema. Respiratory: Normal respiratory effort, no increased work of breathing. GI: Abdomen is soft, nontender, nondistended, obese Skin: No rashes, bruises or suspicious lesions. Neurologic: Grossly intact, no focal deficits, moving all 4 extremities. Psychiatric: Normal mood and affect.  Laboratory Data: Lab Results  Component Value Date   WBC 5.8 01/15/2019   HGB 12.5 01/15/2019   HCT 39.2 01/15/2019   MCV 92.0 01/15/2019   PLT 152 01/15/2019    Lab Results  Component Value Date   CREATININE 1.50 (H) 05/06/2019     Pertinent Imaging: CLINICAL DATA:  Small enhancing lesion in the right kidney.  EXAM: MRI ABDOMEN WITHOUT AND WITH CONTRAST  TECHNIQUE: Multiplanar multisequence MR imaging of the abdomen was performed both before and after the administration of intravenous contrast.  CONTRAST:  10 cc Gadavist  COMPARISON:  07/16/2018  FINDINGS: Lower chest: Unremarkable.  Hepatobiliary: 1.5 cm lesion in the dome of the liver (image 9/series 9) is stable in the interval and again shows diffuse enhancement after IV contrast administration. No other focal enhancing liver lesion evident. There is no evidence for gallstones, gallbladder wall thickening, or pericholecystic fluid. No intrahepatic or extrahepatic biliary dilation.  Pancreas: No focal mass lesion. No dilatation of the main duct. No intraparenchymal cyst. No peripancreatic edema.  Spleen:  No splenomegaly. No focal mass lesion.  Adrenals/Urinary Tract: No adrenal nodule or mass. As noted previously, multiple Bosniak I and Bosniak II cysts of varying size are identified in the kidneys. Dominant cyst in the left kidney is a 2.5 cm Bosniak II upper pole lesion. Central sinus cysts also noted left kidney.  10 mm subcapsular mildly exophytic lesion in the upper interpolar region of the right kidney (postcontrast image 66/series 13) shows heterogeneous enhancement after IV contrast administration.  Stomach/Bowel: Stomach is unremarkable. No gastric wall thickening. No evidence of outlet obstruction. Duodenum is normally positioned as is the ligament of Treitz. No small bowel or colonic dilatation within the visualized abdomen.  Vascular/Lymphatic: No abdominal aortic aneurysm. No abdominal lymphadenopathy  Other:  No intraperitoneal free fluid.  Musculoskeletal: No abnormal marrow enhancement within the visualized bony anatomy. Probable tiny hemangioma in the T11 vertebral body.  IMPRESSION: 1. Stable 10 mm  enhancing lesion interpolar region of the right kidney, compatible with tiny renal cell carcinoma. 2. Bilateral Bosniak I and Bosniak II cysts of varying size in the kidneys. 3. Stable 1.5 cm enhancing lesion in the dome of liver, likely benign.   Electronically Signed   By: Misty Stanley M.D.   On: 05/06/2019 13:18  MR was personally reviewed, agree with radiologic interpretation.   Assessment & Plan:    1. Renal mass Given the relatively small size of the lesion as well as no interval growth over greater than 6 months.,  I strongly recommend continued surveillance especially in light of her medical comorbidities.  She is absolutely agreeable this plan will prefer surveillance.  We will have her follow-up in 6 months with another MR which will be at the approximately 33-month interval which seems appropriate for now.  Can spread this out thereafter possibly transition to ultrasound. - MR Abdomen W Wo Contrast; Future  Return in about 6 months (around 02/23/2020) for f/u MR.  Vanna ScotlandAshley Lakishia Bourassa, MD  [facility] [address], [address] [place], [place] [address] [phone]

## 2019-10-25 ENCOUNTER — Telehealth: Payer: Self-pay

## 2019-10-25 NOTE — Progress Notes (Signed)
Patient ID: Shelby [name], female    DOB: 01-24-1941, 78 y.o.   MRN: [id number]  HPI  Shelby [name] is a 78 y/o female with a history of DM, hyperlipidemia, HTN, anemia, obstructive sleep apnea, neuropathy and chronic heart failure.   Echo report from 07/25/2019 reviewed and showed an EF of 55-60%. Echo report from 02/13/15 reviewed and showed an EF of 35% along with moderate MR and mild PHTN. Function study on 03/16/15 showed an EF of 52%.   Has not been admitted or been in the ED in the last 6 months.      She presents today for a follow-up visit with a chief complaint of minimal fatigue upon moderate exertion. She describes this as chronic in nature having been present for several years. She has associated back pain along with this. She denies any difficulty sleeping, dizziness, abdominal distention, palpitations, pedal edema, chest pain, shortness of breath, cough or weight gain.   Past Medical History:  Diagnosis Date  . Anemia   . Cardiomyopathy ([place])    Ejection Fraction 30-35% per ECHO 2016  . CHF (congestive heart failure) ([place])   . Diabetes ([place])   . Dysrhythmia   . Hyperlipidemia   . Hypertension   . Peripheral neuropathy   . Peripheral neuropathy   . Sleep apnea    Past Surgical History:  Procedure Laterality Date  . ABDOMINAL HYSTERECTOMY    . COLONOSCOPY    . COLONOSCOPY N/A 04/01/2018   Procedure: COLONOSCOPY;  Surgeon: Lollie Sails, MD;  Location: [facility] ENDOSCOPY;  Service: Endoscopy;  Laterality: N/A;  . COLONOSCOPY WITH PROPOFOL N/A 12/29/2017   Procedure: COLONOSCOPY WITH PROPOFOL;  Surgeon: Lollie Sails, MD;  Location: [facility] ENDOSCOPY;  Service: Endoscopy;  Laterality: N/A;  . ESOPHAGOGASTRODUODENOSCOPY (EGD) WITH PROPOFOL N/A 12/29/2017   Procedure: ESOPHAGOGASTRODUODENOSCOPY (EGD) WITH PROPOFOL;  Surgeon: Lollie Sails, MD;  Location: [facility] ENDOSCOPY;  Service: Endoscopy;  Laterality: N/A;  . ESOPHAGOGASTRODUODENOSCOPY (EGD) WITH PROPOFOL N/A  04/01/2018   Procedure: ESOPHAGOGASTRODUODENOSCOPY (EGD) WITH PROPOFOL;  Surgeon: Lollie Sails, MD;  Location: [facility] ENDOSCOPY;  Service: Endoscopy;  Laterality: N/A;  . EUS N/A 05/20/2018   Procedure: ESOPHAGEAL ENDOSCOPIC ULTRASOUND (EUS) RADIAL;  Surgeon: Holly Bodily, MD;  Location: [facility] ENDOSCOPY;  Service: Gastroenterology;  Laterality: N/A;   Family History  Problem Relation Age of Onset  . Diabetes Mother   . Heart disease Mother   . Bladder Cancer Neg Hx   . Kidney cancer Neg Hx    Social History   Tobacco Use  . Smoking status: Never Smoker  . Smokeless tobacco: Never Used  Substance Use Topics  . Alcohol use: No   Allergies  Allergen Reactions  . Mirabegron Other (See Comments)    Elevated BP  . Furosemide Other (See Comments)    Legs swelling    Prior to Admission medications   Medication Sig Start Date End Date Taking? Authorizing Provider  amLODipine (NORVASC) 5 MG tablet Take 10 mg by mouth daily.  12/11/15  Yes [provider]  aspirin EC 81 MG tablet Take 81 mg by mouth daily.   Yes [provider]  atorvastatin (LIPITOR) 80 MG tablet Take 80 mg by mouth daily.  12/11/15  Yes [provider]  brimonidine (ALPHAGAN) 0.2 % ophthalmic solution Place 1 drop into both eyes 2 (two) times daily.    Yes [provider]  candesartan (ATACAND) 32 MG tablet Take by mouth daily.  07/15/16  Yes [provider]  carvedilol (COREG) 25 MG tablet Take by mouth 2 (two) times daily with a meal.  07/15/16  Yes [provider]  cyclobenzaprine (FEXMID) 7.5 MG tablet Take 1 tablet (7.5 mg total) by mouth 2 (two) times daily as needed for muscle spasms. 01/15/19  Yes Sharyn CreamerQuale, Mark, MD  donepezil (ARICEPT) 10 MG tablet Take 10 mg by mouth at bedtime.   Yes [provider]  ferrous sulfate 325 (65 FE) MG tablet Take by mouth. Takes one pill every 2-3 days to minimize constipation   Yes [provider]  fesoterodine  (TOVIAZ) 8 MG TB24 tablet Take 1 tablet (8 mg total) by mouth daily. 03/11/18  Yes McGowan, Carollee HerterShannon A, PA-C  magnesium oxide (MAG-OX) 400 MG tablet Take 400 mg by mouth daily.    Yes [provider]  Multiple Vitamin (MULTI-VITAMINS) TABS Take 1 tablet by mouth daily.    Yes [provider]  Omega-3 Fatty Acids (FISH OIL) 1000 MG CAPS Take 1,000 mg by mouth daily.   Yes [provider]  pantoprazole (PROTONIX) 40 MG tablet Take 40 mg by mouth daily.    Yes [provider]  travoprost, benzalkonium, (TRAVATAN) 0.004 % ophthalmic solution Place 1 drop into both eyes at bedtime.    Yes [provider]  conjugated estrogens (PREMARIN) vaginal cream Place 1 Applicatorful vaginally daily. Apply 0.5mg  (pea-sized amount)  just inside the vaginal introitus with a finger-tip every night for two weeks and then Monday, Wednesday and Friday nights. Patient not taking: Reported on 10/26/2019 03/11/18   Michiel CowboyMcGowan, Shannon A, PA-C    Review of Systems  Constitutional: Positive for fatigue (minimal). Negative for appetite change.  HENT: Negative for congestion, postnasal drip and sore throat.   Eyes: Negative.   Respiratory: Negative for cough, chest tightness and shortness of breath.   Cardiovascular: Negative for chest pain, palpitations and leg swelling.  Gastrointestinal: Negative for abdominal distention and abdominal pain.  Endocrine: Negative.   Genitourinary: Negative.   Musculoskeletal: Positive for back pain. Negative for arthralgias and neck pain.  Skin: Negative.   Allergic/Immunologic: Negative.   Neurological: Positive for numbness (tingling in feet due to neuropathy). Negative for dizziness and light-headedness.  Hematological: Negative for adenopathy. Does not bruise/bleed easily.  Psychiatric/Behavioral: Negative for dysphoric mood and sleep disturbance. The patient is not nervous/anxious.    Vitals:   10/26/19 1117  BP: (!) 114/57  Pulse: 71   Resp: 18  SpO2: 98%  Weight: 226 lb (102.5 kg)  Height: 5\' 4"  (1.626 m)   Wt Readings from Last 3 Encounters:  10/26/19 226 lb (102.5 kg)  08/26/19 230 lb (104.3 kg)  07/15/19 227 lb (103 kg)   Lab Results  Component Value Date   CREATININE 1.50 (H) 05/06/2019   CREATININE 1.11 (H) 01/15/2019   CREATININE 1.42 (H) 11/19/2018     Physical Exam  Constitutional: She is oriented to person, place, and time. She appears well-developed and well-nourished.  HENT:  Head: Normocephalic and atraumatic.  Neck: Normal range of motion. Neck supple. No JVD present.  Cardiovascular: Normal rate and regular rhythm.  Pulmonary/Chest: Effort normal. She has no wheezes. She has no rales.  Abdominal: Soft. She exhibits no distension. There is no abdominal tenderness.  Musculoskeletal:        General: No tenderness or edema.  Neurological: She is alert and oriented to person, place, and time.  Skin: Skin is warm and dry.  Psychiatric: She has a normal mood and affect. Her  behavior is normal. Thought content normal.  Nursing note and vitals reviewed.  Assessment & Plan:  1: Chronic heart failure with now preserved ejection fraction- - NYHA class II - euvolemic today - weighing daily. Reminded to call for an overnight weight gain of >2 pounds or a weekly weight gain of >5 pounds - weight stable from last visit 3 months ago - trying to not add salt; reviewed the importance of closely following a 2000mg  sodium diet - drinks ~32 ounces of water daily along with a a soda or tea - saw cardiology ([place]) 06/09/18  - BNP 11/19/18 was 25.0 - reports receiving her flu vaccine for this season - wearing her compression boots as needed now  2: HTN- - BP looks good today  - saw PCP 11/21/18) 09/01/2019 - BMP from 06/15/2019 reviewed and showed sodium 141, potassium 4.3, creatinine 1.2 and GFR 93  3: Diabetes- - says that her glucose has been good and no longer takes metformin - A1c on 06/15/2019 was  6.4%  4: Obstructive sleep apnea- - sleep study was July 2019 - saw pulmonology August 2019) 12/20/2018 & returns tomorrow - continues to sleep well wearing her CPAP   Medication list was reviewed.   Return in 6 months or sooner for any questions/problems before then.

## 2019-10-25 NOTE — Telephone Encounter (Signed)
Confirmed appointment with patient. klh °

## 2019-10-26 ENCOUNTER — Other Ambulatory Visit: Payer: Self-pay

## 2019-10-26 ENCOUNTER — Encounter: Payer: Self-pay | Admitting: Family

## 2019-10-26 ENCOUNTER — Ambulatory Visit: Payer: Medicare Other | Attending: Family | Admitting: Family

## 2019-10-26 VITALS — BP 114/57 | HR 71 | Resp 18 | Ht 64.0 in | Wt 226.0 lb

## 2019-10-26 DIAGNOSIS — Z7982 Long term (current) use of aspirin: Secondary | ICD-10-CM | POA: Insufficient documentation

## 2019-10-26 DIAGNOSIS — E785 Hyperlipidemia, unspecified: Secondary | ICD-10-CM | POA: Insufficient documentation

## 2019-10-26 DIAGNOSIS — Z8249 Family history of ischemic heart disease and other diseases of the circulatory system: Secondary | ICD-10-CM | POA: Insufficient documentation

## 2019-10-26 DIAGNOSIS — I272 Pulmonary hypertension, unspecified: Secondary | ICD-10-CM | POA: Insufficient documentation

## 2019-10-26 DIAGNOSIS — I5032 Chronic diastolic (congestive) heart failure: Secondary | ICD-10-CM | POA: Diagnosis not present

## 2019-10-26 DIAGNOSIS — I110 Hypertensive heart disease with heart failure: Secondary | ICD-10-CM | POA: Insufficient documentation

## 2019-10-26 DIAGNOSIS — I509 Heart failure, unspecified: Secondary | ICD-10-CM | POA: Diagnosis present

## 2019-10-26 DIAGNOSIS — Z888 Allergy status to other drugs, medicaments and biological substances status: Secondary | ICD-10-CM | POA: Diagnosis not present

## 2019-10-26 DIAGNOSIS — E119 Type 2 diabetes mellitus without complications: Secondary | ICD-10-CM

## 2019-10-26 DIAGNOSIS — Z833 Family history of diabetes mellitus: Secondary | ICD-10-CM | POA: Insufficient documentation

## 2019-10-26 DIAGNOSIS — E1142 Type 2 diabetes mellitus with diabetic polyneuropathy: Secondary | ICD-10-CM | POA: Insufficient documentation

## 2019-10-26 DIAGNOSIS — G4733 Obstructive sleep apnea (adult) (pediatric): Secondary | ICD-10-CM | POA: Diagnosis not present

## 2019-10-26 DIAGNOSIS — I10 Essential (primary) hypertension: Secondary | ICD-10-CM

## 2019-10-26 DIAGNOSIS — I429 Cardiomyopathy, unspecified: Secondary | ICD-10-CM | POA: Diagnosis not present

## 2019-10-26 DIAGNOSIS — Z79899 Other long term (current) drug therapy: Secondary | ICD-10-CM | POA: Insufficient documentation

## 2019-10-26 NOTE — Patient Instructions (Signed)
Continue weighing daily and call for an overnight weight gain of > 2 pounds or a weekly weight gain of >5 pounds. 

## 2019-10-27 ENCOUNTER — Ambulatory Visit (INDEPENDENT_AMBULATORY_CARE_PROVIDER_SITE_OTHER): Payer: Medicare Other | Admitting: Internal Medicine

## 2019-10-27 VITALS — BP 109/60 | HR 75 | Temp 97.0°F | Resp 16 | Ht 64.0 in | Wt 227.0 lb

## 2019-10-27 DIAGNOSIS — I429 Cardiomyopathy, unspecified: Secondary | ICD-10-CM

## 2019-10-27 DIAGNOSIS — Z9989 Dependence on other enabling machines and devices: Secondary | ICD-10-CM

## 2019-10-27 DIAGNOSIS — I10 Essential (primary) hypertension: Secondary | ICD-10-CM

## 2019-10-27 DIAGNOSIS — G4733 Obstructive sleep apnea (adult) (pediatric): Secondary | ICD-10-CM | POA: Diagnosis not present

## 2019-10-27 NOTE — Progress Notes (Signed)
[facility] [address] [place], [place] [address]  Pulmonary Sleep Medicine   Office Visit Note  Patient Name: Shelby [name] DOB: 1941-05-25 MRN [id number]  Date of Service: 10/27/2019  Complaints/HPI: Pt is here for CPAP follow up.  She reports she is sleeping well.  She uses her cpap nightly without difficulty.  She reports she is sleeping well, and is happy with there therapy. She has a good seal on her mask.  Pt reports good compliance with CPAP therapy. Cleaning machine by hand, and changing filters and tubing as directed. Denies headaches, sinus issues, palpitations, or hemoptysis.    ROS  General: (-) fever, (-) chills, (-) night sweats, (-) weakness Skin: (-) rashes, (-) itching,. Eyes: (-) visual changes, (-) redness, (-) itching. Nose and Sinuses: (-) nasal stuffiness or itchiness, (-) postnasal drip, (-) nosebleeds, (-) sinus trouble. Mouth and Throat: (-) sore throat, (-) hoarseness. Neck: (-) swollen glands, (-) enlarged thyroid, (-) neck pain. Respiratory: - cough, (-) bloody sputum, - shortness of breath, - wheezing. Cardiovascular: - ankle swelling, (-) chest pain. Lymphatic: (-) lymph node enlargement. Neurologic: (-) numbness, (-) tingling. Psychiatric: (-) anxiety, (-) depression   Current Medication: Outpatient Encounter Medications as of 10/27/2019  Medication Sig  . amLODipine (NORVASC) 5 MG tablet Take 10 mg by mouth daily.   Marland Kitchen aspirin EC 81 MG tablet Take 81 mg by mouth daily.  Marland Kitchen atorvastatin (LIPITOR) 80 MG tablet Take 80 mg by mouth daily.   . brimonidine (ALPHAGAN) 0.2 % ophthalmic solution Place 1 drop into both eyes 2 (two) times daily.   . candesartan (ATACAND) 32 MG tablet Take by mouth daily.   . carvedilol (COREG) 25 MG tablet Take by mouth 2 (two) times daily with a meal.   . conjugated estrogens (PREMARIN) vaginal cream Place 1 Applicatorful vaginally daily. Apply 0.5mg  (pea-sized amount)  just inside the vaginal  introitus with a finger-tip every night for two weeks and then Monday, Wednesday and Friday nights.  . cyclobenzaprine (FEXMID) 7.5 MG tablet Take 1 tablet (7.5 mg total) by mouth 2 (two) times daily as needed for muscle spasms.  Marland Kitchen donepezil (ARICEPT) 10 MG tablet Take 10 mg by mouth at bedtime.  . ferrous sulfate 325 (65 FE) MG tablet Take by mouth. Takes one pill every 2-3 days to minimize constipation  . fesoterodine (TOVIAZ) 8 MG TB24 tablet Take 1 tablet (8 mg total) by mouth daily.  . magnesium oxide (MAG-OX) 400 MG tablet Take 400 mg by mouth daily.   . Multiple Vitamin (MULTI-VITAMINS) TABS Take 1 tablet by mouth daily.   . Omega-3 Fatty Acids (FISH OIL) 1000 MG CAPS Take 1,000 mg by mouth daily.  . pantoprazole (PROTONIX) 40 MG tablet Take 40 mg by mouth daily.   . travoprost, benzalkonium, (TRAVATAN) 0.004 % ophthalmic solution Place 1 drop into both eyes at bedtime.    No facility-administered encounter medications on file as of 10/27/2019.     Surgical History: Past Surgical History:  Procedure Laterality Date  . ABDOMINAL HYSTERECTOMY    . COLONOSCOPY    . COLONOSCOPY N/A 04/01/2018   Procedure: COLONOSCOPY;  Surgeon: Christena Deem, MD;  Location: [facility] ENDOSCOPY;  Service: Endoscopy;  Laterality: N/A;  . COLONOSCOPY WITH PROPOFOL N/A 12/29/2017   Procedure: COLONOSCOPY WITH PROPOFOL;  Surgeon: Christena Deem, MD;  Location: [facility] ENDOSCOPY;  Service: Endoscopy;  Laterality: N/A;  . ESOPHAGOGASTRODUODENOSCOPY (EGD) WITH PROPOFOL N/A 12/29/2017   Procedure: ESOPHAGOGASTRODUODENOSCOPY (EGD) WITH PROPOFOL;  Surgeon:  Lollie Sails, MD;  Location: [facility] ENDOSCOPY;  Service: Endoscopy;  Laterality: N/A;  . ESOPHAGOGASTRODUODENOSCOPY (EGD) WITH PROPOFOL N/A 04/01/2018   Procedure: ESOPHAGOGASTRODUODENOSCOPY (EGD) WITH PROPOFOL;  Surgeon: Lollie Sails, MD;  Location: [facility] ENDOSCOPY;  Service: Endoscopy;  Laterality: N/A;  . EUS N/A 05/20/2018   Procedure: ESOPHAGEAL  ENDOSCOPIC ULTRASOUND (EUS) RADIAL;  Surgeon: Holly Bodily, MD;  Location: [facility] ENDOSCOPY;  Service: Gastroenterology;  Laterality: N/A;    Medical History: Past Medical History:  Diagnosis Date  . Anemia   . Cardiomyopathy ([place])    Ejection Fraction 30-35% per ECHO 2016  . CHF (congestive heart failure) ([place])   . Diabetes ([place])   . Dysrhythmia   . Hyperlipidemia   . Hypertension   . Peripheral neuropathy   . Peripheral neuropathy   . Sleep apnea     Family History: Family History  Problem Relation Age of Onset  . Diabetes Mother   . Heart disease Mother   . Bladder Cancer Neg Hx   . Kidney cancer Neg Hx     Social History: Social History   Socioeconomic History  . Marital status: Widowed    Spouse name: Not on file  . Number of children: Not on file  . Years of education: Not on file  . Highest education level: Not on file  Occupational History  . Not on file  Social Needs  . Financial resource strain: Not very hard  . Food insecurity    Worry: Never true    Inability: Never true  . Transportation needs    Medical: No    Non-medical: No  Tobacco Use  . Smoking status: Never Smoker  . Smokeless tobacco: Never Used  Substance and Sexual Activity  . Alcohol use: No  . Drug use: No  . Sexual activity: Yes    Birth control/protection: Post-menopausal  Lifestyle  . Physical activity    Days per week: 0 days    Minutes per session: 0 min  . Stress: Not at all  Relationships  . Social connections    Talks on phone: More than three times a week    Gets together: Once a week    Attends religious service: More than 4 times per year    Active member of club or organization: Yes    Attends meetings of clubs or organizations: More than 4 times per year    Relationship status: Widowed  . Intimate partner violence    Fear of current or ex partner: Patient refused    Emotionally abused: Patient refused    Physically abused: Patient refused    Forced  sexual activity: Patient refused  Other Topics Concern  . Not on file  Social History Narrative  . Not on file    Vital Signs: Blood pressure 109/60, pulse 75, temperature (!) 97 F (36.1 C), resp. rate 16, height 5\' 4"  (1.626 m), weight 227 lb (103 kg), SpO2 98 %.  Examination: General Appearance: The patient is well-developed, well-nourished, and in no distress. Skin: Gross inspection of skin unremarkable. Head: normocephalic, no gross deformities. Eyes: no gross deformities noted. ENT: ears appear grossly normal no exudates. Neck: Supple. No thyromegaly. No LAD. Respiratory: clear bilaterally. Cardiovascular: Normal S1 and S2 without murmur or rub. Extremities: No cyanosis. pulses are equal. Neurologic: Alert and oriented. No involuntary movements.  LABS: No results found for this or any previous visit (from the past 2160 hour(s)).  Radiology: No results found.  No results found.  No results  found.    Assessment and Plan: Patient Active Problem List   Diagnosis Date Noted  . Lymphedema 09/03/2018  . Chronic systolic heart failure (HCC) 03/08/2018  . Encounter for dental exam and cleaning w/o abnormal findings 01/14/2018  . High risk medication use 12/11/2015  . Left lumbar radiculopathy 12/05/2014  . Anemia 06/12/2014  . Cardiomyopathy (HCC) 06/12/2014  . Benign essential hypertension 06/12/2014  . Hyperlipidemia 06/12/2014  . Obstructive sleep apnea 06/12/2014  . Peripheral neuropathy 06/12/2014  . Proteinuria 06/12/2014  . 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HCC) 06/12/2014  . 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 Vitamin B 12 deficiency 06/12/2014  . Vitamin D deficiency 06/12/2014    1. OSA on CPAP Pt will continue with present cpap therapy as prescribed.   2. Benign essential hypertension Controlled, continue current med therapy.   3. Cardiomyopathy, unspecified type [facility](HCC) Managed by  Cardiology, and doing well at this time.  Continue present management.  4. Morbid obesity (HCC) Obesity Counseling: Risk Assessment: An assessment of behavioral risk factors was made today and includes lack of exercise sedentary lifestyle, lack of portion control and poor dietary habits.  Risk Modification Advice: She was counseled on portion control guidelines. Restricting daily caloric intake to. . The detrimental long term effects of obesity on her health and ongoing poor compliance was also discussed with the patient.    General Counseling: I have discussed the findings of the evaluation and examination with Santina Evansatherine.  I have also discussed any further diagnostic evaluation thatmay be needed or ordered today. Santina EvansCatherine verbalizes understanding of the findings of todays visit. We also reviewed her medications today and discussed drug interactions and side effects including but not limited excessive drowsiness and altered mental states. We also discussed that there is always a risk not just to her but also people around her. she has been encouraged to call the office with any questions or concerns that should arise related to todays visit.    Time spent: 25  I have personally obtained a history, examined the patient, evaluated laboratory and imaging results, formulated the assessment and plan and placed orders.    Yevonne PaxSaadat A Khan, MD [facility] Pulmonary and Critical Care Sleep medicine

## 2019-11-03 IMAGING — CT CT CHEST W/O CM
2 of 4 series · 15 of 36 positions shown, 18 images · non-contrast
Comparison: Abdomen and pelvis CT 01/23/2018.

CLINICAL DATA: Pulmonary nodule.

EXAM:
CT CHEST WITHOUT CONTRAST
TECHNIQUE: Multidetector CT imaging of the chest was performed following the
standard protocol without IV contrast.

[Series 2: chest · axial · 0.60mm/px · z∈[-1166,-922]mm · 12 of 146 slices shown, 15 images (1 of 2)]
[im 12/146  mediastinal]
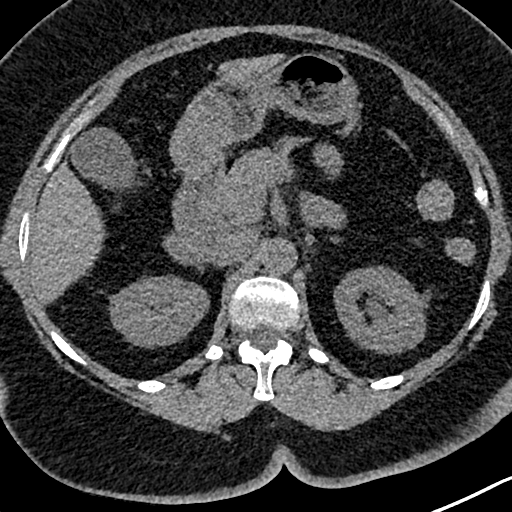
[im 12/146  lung]
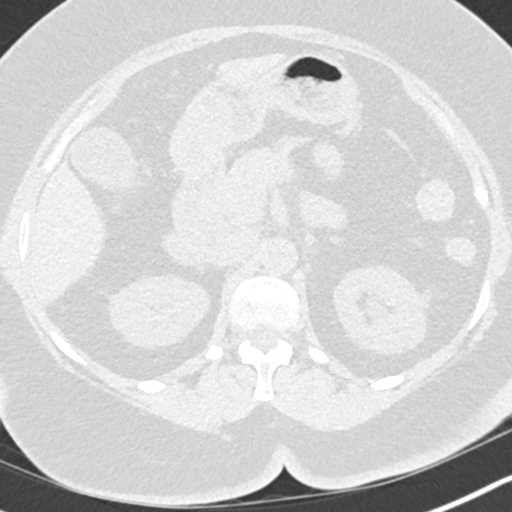
[im 23/146  lung]
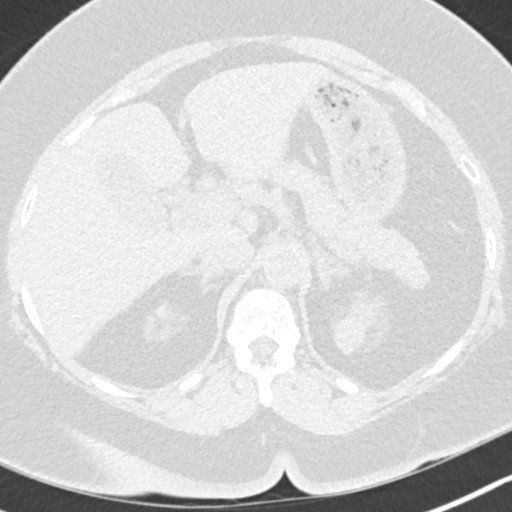
[im 34/146  lung]
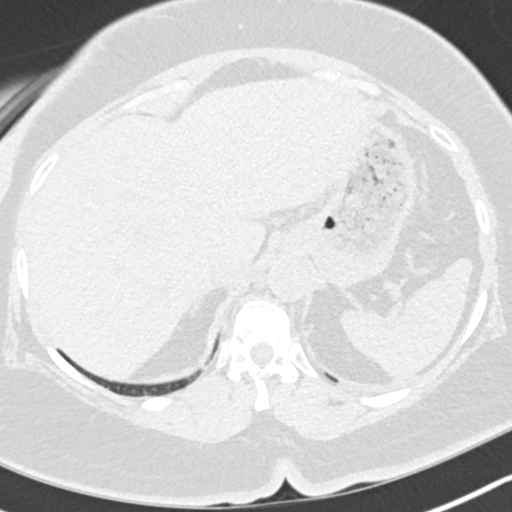
[im 45/146  lung]
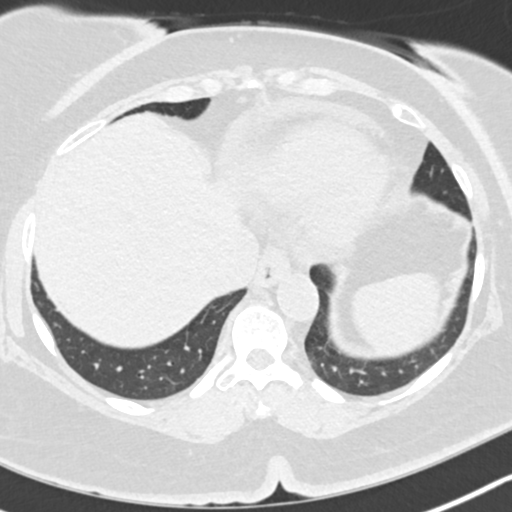
[im 56/146  mediastinal]
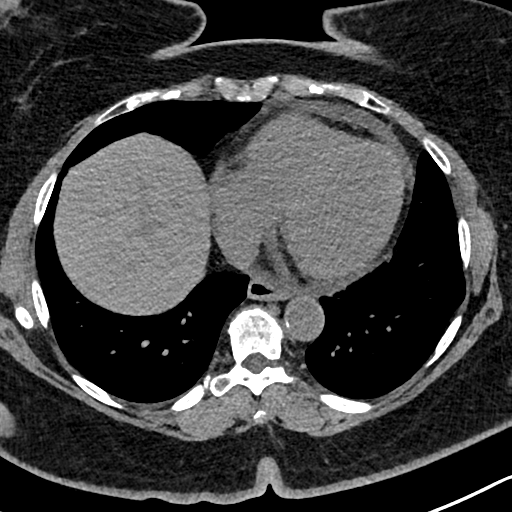
[im 56/146  lung]
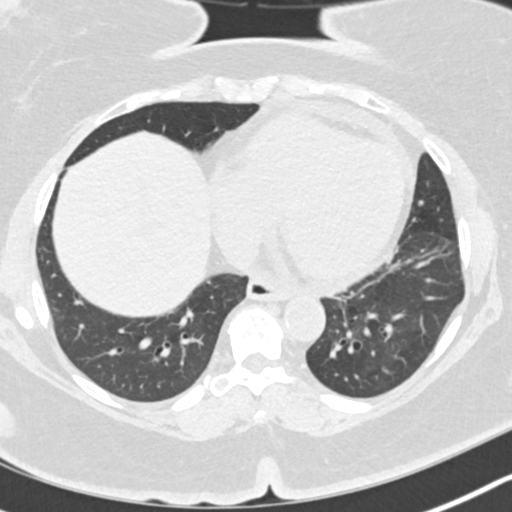
[im 67/146  lung]
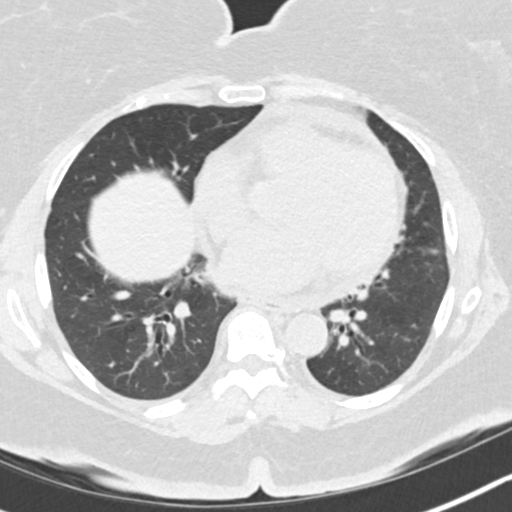
[im 79/146  lung]
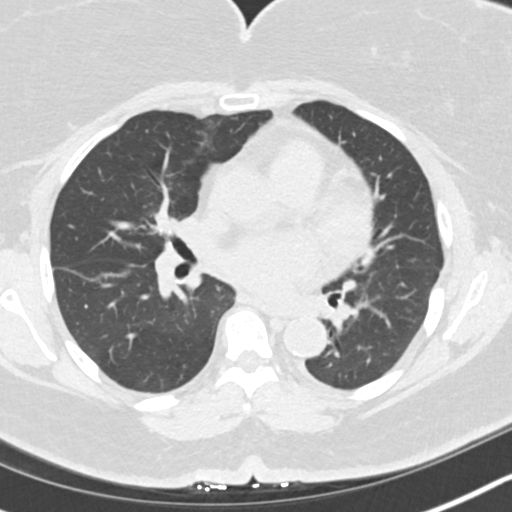
[im 90/146  lung]
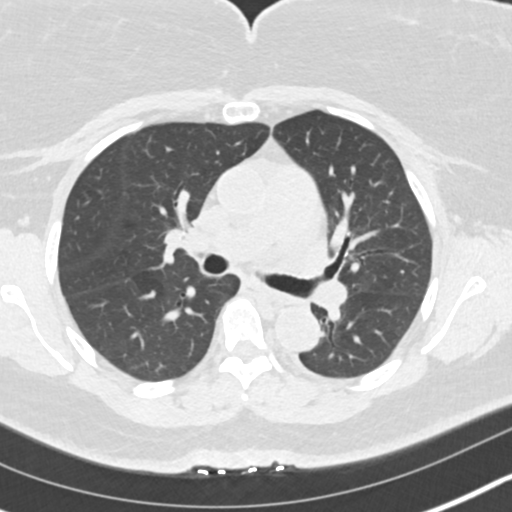
[im 101/146  mediastinal]
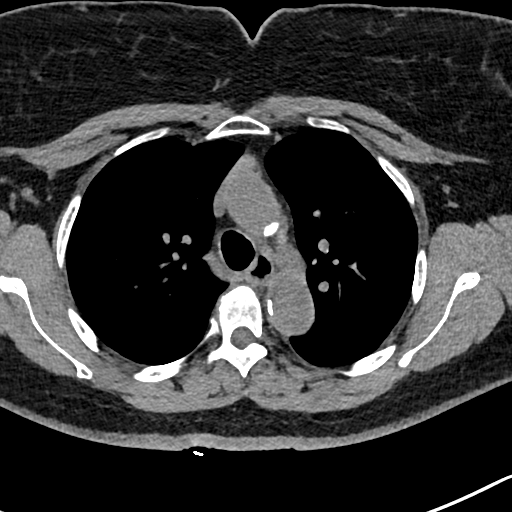
[im 101/146  lung]
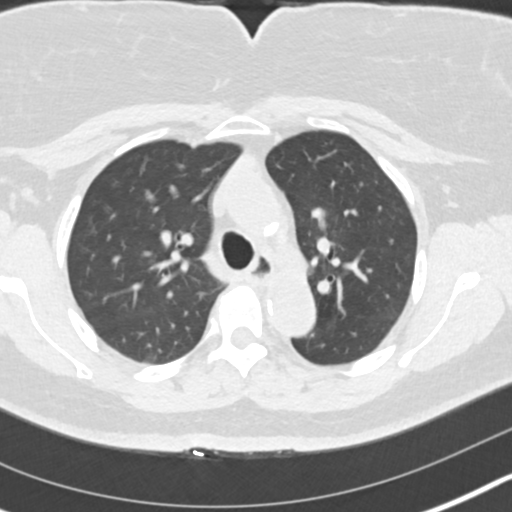
[im 112/146  lung]
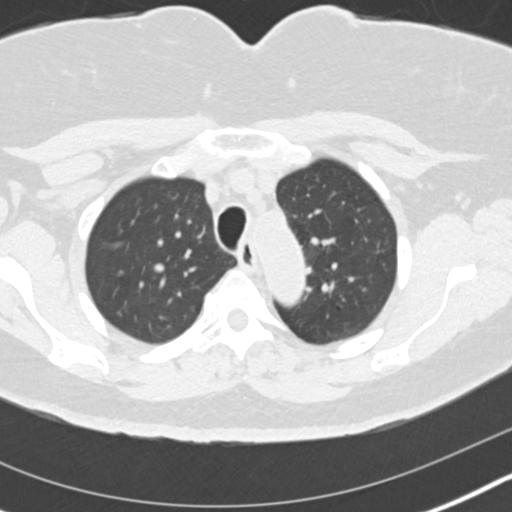
[im 123/146  lung]
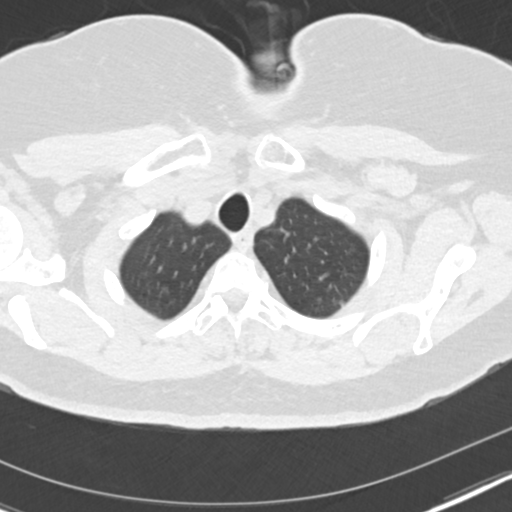
[im 134/146  lung]
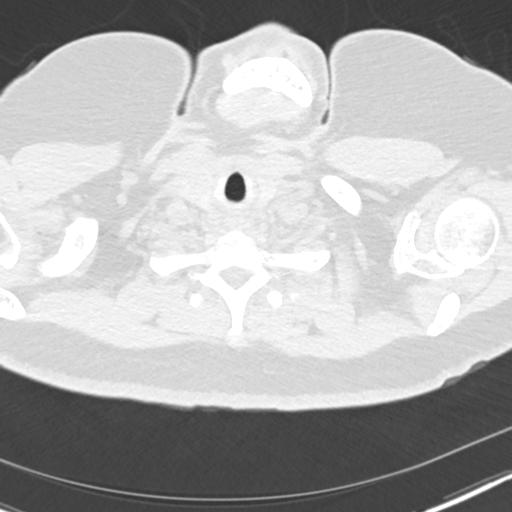

[Series 5: chest · coronal · 0.57mm/px · 3 of 153 slices shown (2 of 2)]
[im 31/153  lung]
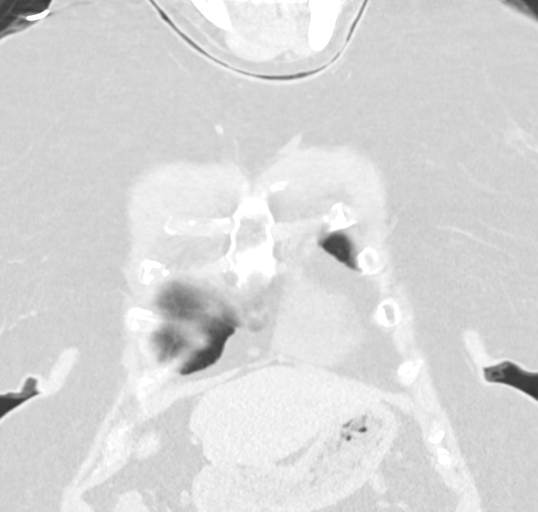
[im 61/153  lung]
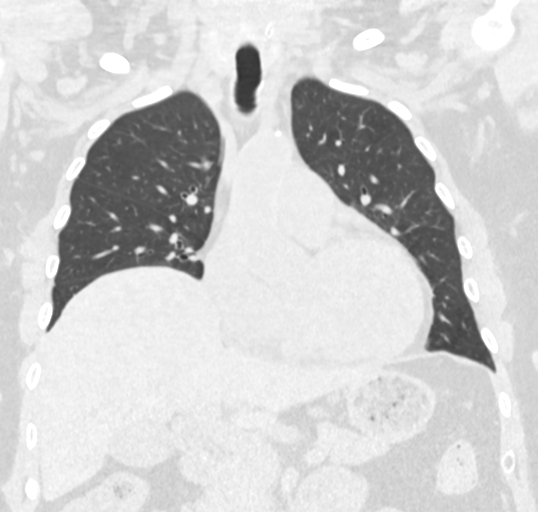
[im 92/153  lung]
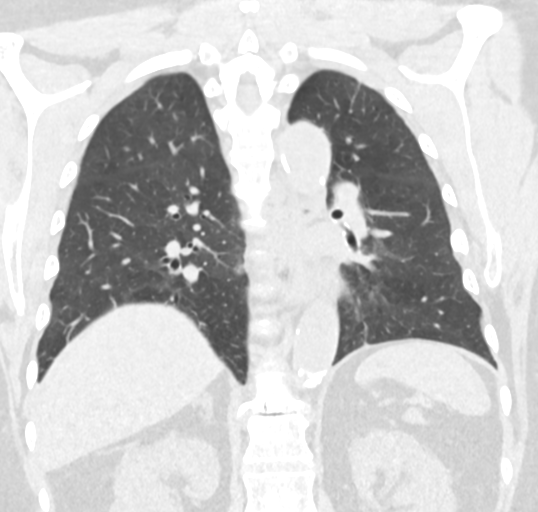

[15 of 36 positions shown; findings below may reference images not displayed]

FINDINGS: Cardiovascular: Heart size upper normal. Tiny pericardial effusion
evident. There is abdominal aortic atherosclerosis without aneurysm.

Mediastinum/Nodes: No mediastinal lymphadenopathy. No evidence for
gross hilar lymphadenopathy although assessment is limited by the
lack of intravenous contrast on today's study. The esophagus has
normal imaging features. There is no axillary lymphadenopathy.
cm right thyroid nodule evident.

Lungs/Pleura: Scattered subsegmental atelectasis or linear scar
noted right lung. Tiny nodule identified on prior study in the left
lung base has resolved in the interval. Subsegmental atelectasis or
scarring noted in the left lung base with new tiny ill-defined
nodular opacities in the posterior left costophrenic sulcus.

Upper Abdomen: Tiny layering gallstones evident. 2 cm exophytic
lesion upper pole left kidney has attenuation too high to be a
simple cyst. Hyperattenuating lesions at the lower pole the left
kidney noted on the previous study of not been included on today's
exam.

Musculoskeletal: No worrisome lytic or sclerotic osseous
abnormality.
IMPRESSION: 1. 6 mm left lower lobe pulmonary nodule identified on the previous
abdomen/pelvis CT has resolved in the interval.
2. 1.9 cm right thyroid nodule. Thyroid ultrasound recommended to
further evaluate.
3. 2 cm exophytic lesion upper pole left kidney with attenuation too
high to be a simple cyst. While this may be a cyst complicated by
proteinaceous debris or hemorrhage, dedicated abdominal MRI
recommended to confirm.
4.  Aortic Atherosclerois ([id number])

## 2019-12-09 DIAGNOSIS — I110 Hypertensive heart disease with heart failure: Secondary | ICD-10-CM | POA: Insufficient documentation

## 2019-12-09 DIAGNOSIS — E1142 Type 2 diabetes mellitus with diabetic polyneuropathy: Secondary | ICD-10-CM | POA: Insufficient documentation

## 2019-12-09 DIAGNOSIS — K8020 Calculus of gallbladder without cholecystitis without obstruction: Secondary | ICD-10-CM | POA: Insufficient documentation

## 2019-12-09 DIAGNOSIS — G8929 Other chronic pain: Secondary | ICD-10-CM | POA: Insufficient documentation

## 2020-02-21 ENCOUNTER — Ambulatory Visit: Admission: RE | Admit: 2020-02-21 | Source: Ambulatory Visit

## 2020-02-23 NOTE — Progress Notes (Incomplete)
02/24/20 9:29 PM   Shelby [name] 05-02-1941 [id number]  Referring provider: Mickey Farber, MD [address] [facility] [place],  [place] [address]  No chief complaint on file.   HPI: Shelby [name] is a 79 y.o. African American F who returns today for a 6 month f/u for the evaluation and management of her renal mass.   She was initially seen and evaluated for right ureteral calculus which she subsequently passed. On this imaging, she did have multiple indeterminate renal lesions. She underwent further evaluation in the form of MRI of the abdomen with and without contrast for further characterization.  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 No lymphadenopathy or vein involvement.  Repeat MRI in 04/2019 shows essentially stable 10 mm enhancing renal lesion in the interpolar right kidney.  She has Bosniak 1 and 2 renal cyst in addition to this.  She also has a stable 1.5 cm enhancing lesion at the dome of the bladder which is felt to be likely benign.  Most recent MRI shows    1. Renal mass Given the relatively small size of the lesion as well as no interval growth over greater than 6 months.,  I strongly recommend continued surveillance especially in light of her medical comorbidities.  She is absolutely agreeable this plan will prefer surveillance.  We will have her follow-up in 6 months with another MR which will be at the approximately 4-month interval which seems appropriate for now.  Can spread this out thereafter possibly transition to ultrasound.   PMH: Past Medical History:  Diagnosis Date  . Anemia   . Cardiomyopathy (HCC)    Ejection Fraction 30-35% per ECHO 2016  . CHF (congestive heart failure) (HCC)   . Diabetes (HCC)   .  Dysrhythmia   . Hyperlipidemia   . Hypertension   . Peripheral neuropathy   . Peripheral neuropathy   . Sleep apnea     Surgical History: Past Surgical History:  Procedure Laterality Date  . ABDOMINAL HYSTERECTOMY    . COLONOSCOPY    . COLONOSCOPY N/A 04/01/2018   Procedure: COLONOSCOPY;  Surgeon: Christena Deem, MD;  Location: [facility] ENDOSCOPY;  Service: Endoscopy;  Laterality: N/A;  . COLONOSCOPY WITH PROPOFOL N/A 12/29/2017   Procedure: COLONOSCOPY WITH PROPOFOL;  Surgeon: Christena Deem, MD;  Location: [facility] ENDOSCOPY;  Service: Endoscopy;  Laterality: N/A;  . ESOPHAGOGASTRODUODENOSCOPY (EGD) WITH PROPOFOL N/A 12/29/2017   Procedure: ESOPHAGOGASTRODUODENOSCOPY (EGD) WITH PROPOFOL;  Surgeon: Christena Deem, MD;  Location: [facility] ENDOSCOPY;  Service: Endoscopy;  Laterality: N/A;  . ESOPHAGOGASTRODUODENOSCOPY (EGD) WITH PROPOFOL N/A 04/01/2018   Procedure: ESOPHAGOGASTRODUODENOSCOPY (EGD) WITH PROPOFOL;  Surgeon: Christena Deem, MD;  Location: [facility] ENDOSCOPY;  Service: Endoscopy;  Laterality: N/A;  . EUS N/A 05/20/2018   Procedure: ESOPHAGEAL ENDOSCOPIC ULTRASOUND (EUS) RADIAL;  Surgeon: Bearl Mulberry, MD;  Location: [facility] ENDOSCOPY;  Service: Gastroenterology;  Laterality: N/A;    Home Medications:  Allergies as of 02/24/2020      Reactions   Mirabegron Other (See Comments)   Elevated BP   Furosemide Other (See Comments)   Legs swelling      Medication List       Accurate as of February 23, 2020  9:29 PM. If you have any questions, ask your nurse or doctor.  amLODipine 5 MG tablet Commonly known as: NORVASC Take 10 mg by mouth daily.   aspirin EC 81 MG tablet Take 81 mg by mouth daily.   atorvastatin 80 MG tablet Commonly known as: LIPITOR Take 80 mg by mouth daily.   brimonidine 0.2 % ophthalmic solution Commonly known as: ALPHAGAN Place 1 drop into both eyes 2 (two) times daily.   candesartan 32 MG tablet Commonly known as: ATACAND Take by  mouth daily.   carvedilol 25 MG tablet Commonly known as: COREG Take by mouth 2 (two) times daily with a meal.   conjugated estrogens vaginal cream Commonly known as: Premarin Place 1 Applicatorful vaginally daily. Apply 0.5mg  (pea-sized amount)  just inside the vaginal introitus with a finger-tip every night for two weeks and then Monday, Wednesday and Friday nights.   cyclobenzaprine 7.5 MG tablet Commonly known as: FEXMID Take 1 tablet (7.5 mg total) by mouth 2 (two) times daily as needed for muscle spasms.   donepezil 10 MG tablet Commonly known as: ARICEPT Take 10 mg by mouth at bedtime.   ferrous sulfate 325 (65 FE) MG tablet Take by mouth. Takes one pill every 2-3 days to minimize constipation   fesoterodine 8 MG Tb24 tablet Commonly known as: TOVIAZ Take 1 tablet (8 mg total) by mouth daily.   Fish Oil 1000 MG Caps Take 1,000 mg by mouth daily.   magnesium oxide 400 MG tablet Commonly known as: MAG-OX Take 400 mg by mouth daily.   Multi-Vitamins Tabs Take 1 tablet by mouth daily.   pantoprazole 40 MG tablet Commonly known as: PROTONIX Take 40 mg by mouth daily.   travoprost (benzalkonium) 0.004 % ophthalmic solution Commonly known as: TRAVATAN Place 1 drop into both eyes at bedtime.       Allergies:  Allergies  Allergen Reactions  . Mirabegron Other (See Comments)    Elevated BP  . Furosemide Other (See Comments)    Legs swelling     Family History: Family History  Problem Relation Age of Onset  . Diabetes Mother   . Heart disease Mother   . Bladder Cancer Neg Hx   . Kidney cancer Neg Hx     Social History:  reports that she has never smoked. She has never used smokeless tobacco. She reports that she does not drink alcohol or use drugs.   Physical Exam: There were no vitals taken for this visit.  Constitutional:  Alert and oriented, No acute distress. HEENT: [place] AT, moist mucus membranes.  Trachea midline, no masses. Cardiovascular: No  clubbing, cyanosis, or edema. Respiratory: Normal respiratory effort, no increased work of breathing. GI: Abdomen is soft, nontender, nondistended, no abdominal masses GU: No CVA tenderness Lymph: No cervical or inguinal lymphadenopathy. Skin: No rashes, bruises or suspicious lesions. Neurologic: Grossly intact, no focal deficits, moving all 4 extremities. Psychiatric: Normal mood and affect.  Laboratory Data:  Urinalysis  Pertinent Imaging: ***   Assessment & Plan:    @DIAGMED @  No follow-ups on file.  [facility] [address], [place], [place] [address] [phone]  I, Lucas Mallow, am acting as a scribe for Dr. Hollice Espy,  {Add Scribe Attestation Statement}

## 2020-02-24 ENCOUNTER — Ambulatory Visit: Payer: Medicare Other | Admitting: Urology

## 2020-04-03 ENCOUNTER — Other Ambulatory Visit: Payer: Self-pay

## 2020-04-03 ENCOUNTER — Ambulatory Visit
Admission: RE | Admit: 2020-04-03 | Discharge: 2020-04-03 | Disposition: A | Discharge status: Home / Self Care | Payer: Medicare Other | Source: Ambulatory Visit | Attending: Urology | Admitting: Urology

## 2020-04-03 DIAGNOSIS — N2889 Other specified disorders of kidney and ureter: Secondary | ICD-10-CM | POA: Diagnosis present

## 2020-04-03 MED ORDER — GADOBUTROL 1 MMOL/ML IV SOLN
10.0000 mL | Freq: Once | INTRAVENOUS | Status: AC | PRN
  Administered 2020-04-03: 12:00:00 10 mL via INTRAVENOUS

## 2020-04-05 NOTE — Progress Notes (Incomplete)
04/06/20 11:10 PM   Shelby [name] Feb 26, 1941 [id number]  Referring provider: Mickey Farber, MD [address] [facility] [place],  [place] [address] No chief complaint on file.   HPI: Shelby [name] is a 79 y.o. F who returns today for follow-up MRI of her renal mass.  She was initially seen and evaluated for right ureteral calculus which she subsequently passed. On this imaging, she did have multiple indeterminate renal lesions. 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 No lymphadenopathy or vein involvement.  Repeat MRI in 04/2019 shows essentially stable 10 mm enhancing renal lesion in the interpolar right kidney.  She has Bosniak 1 and 2 renal cyst in addition to this.  She also has a stable 1.5 cm enhancing lesion at the dome of the bladder which is felt to be likely benign.  Repeat MRI in 04/03/2020 shows stable small enhancing 9 mm lesion partially exophytic from the mid RIGHT kidney most consistent solid renal neoplasm. Multiple additional bilateral Bosniak 1 renal cysts and Mild enlargement of small 8 mm cystic lesion in the head of the pancreas also noted.    PMH: Past Medical History:  Diagnosis Date  . Anemia   . Cardiomyopathy (HCC)    Ejection Fraction 30-35% per ECHO 2016  . CHF (congestive heart failure) (HCC)   . Diabetes (HCC)   . Dysrhythmia   . Hyperlipidemia   . Hypertension   . Peripheral neuropathy   . Peripheral neuropathy   . Sleep apnea     Surgical History: Past Surgical History:  Procedure Laterality Date  . ABDOMINAL HYSTERECTOMY    . COLONOSCOPY    . COLONOSCOPY N/A 04/01/2018   Procedure: COLONOSCOPY;  Surgeon: Christena Deem, MD;  Location: [facility] ENDOSCOPY;  Service: Endoscopy;  Laterality: N/A;  . COLONOSCOPY WITH PROPOFOL N/A 12/29/2017   Procedure: COLONOSCOPY WITH PROPOFOL;  Surgeon: Christena Deem, MD;  Location: [facility] ENDOSCOPY;  Service: Endoscopy;  Laterality: N/A;  . ESOPHAGOGASTRODUODENOSCOPY (EGD) WITH PROPOFOL N/A 12/29/2017   Procedure: ESOPHAGOGASTRODUODENOSCOPY (EGD) WITH PROPOFOL;  Surgeon: Christena Deem, MD;  Location: [facility] ENDOSCOPY;  Service: Endoscopy;  Laterality: N/A;  . ESOPHAGOGASTRODUODENOSCOPY (EGD) WITH PROPOFOL N/A 04/01/2018   Procedure: ESOPHAGOGASTRODUODENOSCOPY (EGD) WITH PROPOFOL;  Surgeon: Christena Deem, MD;  Location: [facility] ENDOSCOPY;  Service: Endoscopy;  Laterality: N/A;  . EUS N/A 05/20/2018   Procedure: ESOPHAGEAL ENDOSCOPIC ULTRASOUND (EUS) RADIAL;  Surgeon: Bearl Mulberry, MD;  Location: [facility] ENDOSCOPY;  Service: Gastroenterology;  Laterality: N/A;    Home Medications:  Allergies as of 04/06/2020      Reactions   Mirabegron Other (See Comments)   Elevated BP   Furosemide Other (See Comments)   Legs swelling      Medication List       Accurate as of April 05, 2020 11:10 PM. If you have any questions, ask your nurse or doctor.        amLODipine 5 MG tablet Commonly known as: NORVASC Take 10 mg by mouth daily.   aspirin EC 81 MG tablet Take 81 mg by mouth daily.   atorvastatin 80 MG tablet Commonly known as: LIPITOR Take 80 mg by mouth daily.   brimonidine 0.2 % ophthalmic  solution Commonly known as: ALPHAGAN Place 1 drop into both eyes 2 (two) times daily.   candesartan 32 MG tablet Commonly known as: ATACAND Take by mouth daily.   carvedilol 25 MG tablet Commonly known as: COREG Take by mouth 2 (two) times daily with a meal.   conjugated estrogens vaginal cream Commonly known as: Premarin Place 1 Applicatorful vaginally daily. Apply 0.5mg  (pea-sized amount)  just inside the vaginal  introitus with a finger-tip every night for two weeks and then Monday, Wednesday and Friday nights.   cyclobenzaprine 7.5 MG tablet Commonly known as: FEXMID Take 1 tablet (7.5 mg total) by mouth 2 (two) times daily as needed for muscle spasms.   donepezil 10 MG tablet Commonly known as: ARICEPT Take 10 mg by mouth at bedtime.   ferrous sulfate 325 (65 FE) MG tablet Take by mouth. Takes one pill every 2-3 days to minimize constipation   fesoterodine 8 MG Tb24 tablet Commonly known as: TOVIAZ Take 1 tablet (8 mg total) by mouth daily.   Fish Oil 1000 MG Caps Take 1,000 mg by mouth daily.   magnesium oxide 400 MG tablet Commonly known as: MAG-OX Take 400 mg by mouth daily.   Multi-Vitamins Tabs Take 1 tablet by mouth daily.   pantoprazole 40 MG tablet Commonly known as: PROTONIX Take 40 mg by mouth daily.   travoprost (benzalkonium) 0.004 % ophthalmic solution Commonly known as: TRAVATAN Place 1 drop into both eyes at bedtime.       Allergies:  Allergies  Allergen Reactions  . Mirabegron Other (See Comments)    Elevated BP  . Furosemide Other (See Comments)    Legs swelling     Family History: Family History  Problem Relation Age of Onset  . Diabetes Mother   . Heart disease Mother   . Bladder Cancer Neg Hx   . Kidney cancer Neg Hx     Social History:  reports that she has never smoked. She has never used smokeless tobacco. She reports that she does not drink alcohol or use drugs.   Physical Exam: There were no vitals taken for this visit.  Constitutional:  Alert and oriented, No acute distress. HEENT: [place] AT, moist mucus membranes.  Trachea midline, no masses. Cardiovascular: No clubbing, cyanosis, or edema. Respiratory: Normal respiratory effort, no increased work of breathing. GI: Abdomen is soft, nontender, nondistended, no abdominal masses GU: No CVA tenderness Lymph: No cervical or inguinal lymphadenopathy. Skin: No rashes, bruises or suspicious  lesions. Neurologic: Grossly intact, no focal deficits, moving all 4 extremities. Psychiatric: Normal mood and affect.  Laboratory Data:  Urinalysis   Pertinent Imaging: ***  Assessment & Plan:     No follow-ups on file.  [facility] [address], [place], [place] [address] [phone]  I, Lucas Mallow, am acting as a scribe for Dr. Hollice Espy,  {Add Scribe Attestation Statement}

## 2020-04-06 ENCOUNTER — Ambulatory Visit: Payer: Medicare Other | Admitting: Urology

## 2020-04-06 ENCOUNTER — Telehealth: Payer: Self-pay | Admitting: Urology

## 2020-04-06 DIAGNOSIS — R911 Solitary pulmonary nodule: Secondary | ICD-10-CM

## 2020-04-06 NOTE — Telephone Encounter (Signed)
Patient informed, aware of CT scan ordered and will get completed before her follow up with Dr. Apolinar Junes. Scheduled follow up for [facility] location. Voiced understanding.

## 2020-04-06 NOTE — Telephone Encounter (Signed)
Patient was a no-show for her MRI follow-up.  She needs to be rescheduled.  There are also some new concerning findings including lesions in her lungs.  I will go and order a chest CT as per radiology recommendations continue to be rescheduled to see me ASAP after this is completed.  Please stressed the importance of timely follow-up.  Shelby Scotland, MD

## 2020-04-17 ENCOUNTER — Ambulatory Visit: Payer: Self-pay | Admitting: Urology

## 2020-04-18 ENCOUNTER — Ambulatory Visit: Payer: Self-pay

## 2020-04-18 ENCOUNTER — Other Ambulatory Visit: Payer: Self-pay

## 2020-04-18 ENCOUNTER — Ambulatory Visit
Admission: RE | Admit: 2020-04-18 | Discharge: 2020-04-18 | Disposition: A | Discharge status: Home / Self Care | Payer: Medicare Other | Source: Ambulatory Visit | Attending: Urology | Admitting: Urology

## 2020-04-18 DIAGNOSIS — R911 Solitary pulmonary nodule: Secondary | ICD-10-CM | POA: Diagnosis not present

## 2020-04-21 NOTE — Progress Notes (Signed)
Patient ID: Shelby [name], female    DOB: 1941-10-09, 79 y.o.   MRN: [id number]  HPI  Shelby [name] is a 79 y/o female with a history of DM, hyperlipidemia, HTN, anemia, obstructive sleep apnea, neuropathy and chronic heart failure.   Echo report from 07/25/2019 reviewed and showed an EF of 55-60%. Echo report from 02/13/15 reviewed and showed an EF of 35% along with moderate MR and mild PHTN. Function study on 03/16/15 showed an EF of 52%.   Has not been admitted or been in the ED in the last 6 months.      She presents today for a follow-up visit with a chief complaint of minimal fatigue upon moderate exertion. She describes this as chronic in nature having been present for several years. She has associated back pain and neuropathy along with this. She denies any difficulty sleeping, dizziness, abdominal distention, palpitations, pedal edema, chest pain, shortness of breath or cough.   Hasn't been weighing herself every day.  Past Medical History:  Diagnosis Date  . Anemia   . Cardiomyopathy (HCC)    Ejection Fraction 30-35% per ECHO 2016  . CHF (congestive heart failure) (HCC)   . Diabetes (HCC)   . Dysrhythmia   . Hyperlipidemia   . Hypertension   . Peripheral neuropathy   . Peripheral neuropathy   . Sleep apnea    Past Surgical History:  Procedure Laterality Date  . ABDOMINAL HYSTERECTOMY    . COLONOSCOPY    . COLONOSCOPY N/A 04/01/2018   Procedure: COLONOSCOPY;  Surgeon: Christena Deem, MD;  Location: [facility] ENDOSCOPY;  Service: Endoscopy;  Laterality: N/A;  . COLONOSCOPY WITH PROPOFOL N/A 12/29/2017   Procedure: COLONOSCOPY WITH PROPOFOL;  Surgeon: Christena Deem, MD;  Location: [facility] ENDOSCOPY;  Service: Endoscopy;  Laterality: N/A;  . ESOPHAGOGASTRODUODENOSCOPY (EGD) WITH PROPOFOL N/A 12/29/2017   Procedure: ESOPHAGOGASTRODUODENOSCOPY (EGD) WITH PROPOFOL;  Surgeon: Christena Deem, MD;  Location: [facility] ENDOSCOPY;  Service: Endoscopy;  Laterality: N/A;  .  ESOPHAGOGASTRODUODENOSCOPY (EGD) WITH PROPOFOL N/A 04/01/2018   Procedure: ESOPHAGOGASTRODUODENOSCOPY (EGD) WITH PROPOFOL;  Surgeon: Christena Deem, MD;  Location: [facility] ENDOSCOPY;  Service: Endoscopy;  Laterality: N/A;  . EUS N/A 05/20/2018   Procedure: ESOPHAGEAL ENDOSCOPIC ULTRASOUND (EUS) RADIAL;  Surgeon: Bearl Mulberry, MD;  Location: [facility] ENDOSCOPY;  Service: Gastroenterology;  Laterality: N/A;   Family History  Problem Relation Age of Onset  . Diabetes Mother   . Heart disease Mother   . Bladder Cancer Neg Hx   . Kidney cancer Neg Hx    Social History   Tobacco Use  . Smoking status: Never Smoker  . Smokeless tobacco: Never Used  Substance Use Topics  . Alcohol use: No   Allergies  Allergen Reactions  . Mirabegron Other (See Comments)    Elevated BP  . Furosemide Other (See Comments)    Legs swelling     Prior to Admission medications   Medication Sig Start Date End Date Taking? Authorizing Provider  amLODipine (NORVASC) 5 MG tablet Take 10 mg by mouth daily.  12/11/15  Yes [provider]  ascorbic acid (VITAMIN C) 500 MG tablet Take 500 mg by mouth daily.   Yes [provider]  aspirin EC 81 MG tablet Take 81 mg by mouth daily.   Yes [provider]  atorvastatin (LIPITOR) 80 MG tablet Take 80 mg by mouth daily.  12/11/15  Yes [provider]  brimonidine (ALPHAGAN) 0.2 % ophthalmic solution Place 1 drop into both  eyes 2 (two) times daily.    Yes [provider]  candesartan (ATACAND) 32 MG tablet Take by mouth daily.  07/15/16  Yes [provider]  carvedilol (COREG) 25 MG tablet Take by mouth 2 (two) times daily with a meal.  07/15/16  Yes [provider]  donepezil (ARICEPT) 10 MG tablet Take 10 mg by mouth at bedtime.   Yes [provider]  ferrous sulfate 325 (65 FE) MG tablet Take by mouth. Takes one pill every 2-3 days to minimize constipation   Yes [provider]  fesoterodine  (TOVIAZ) 8 MG TB24 tablet Take 1 tablet (8 mg total) by mouth daily. 03/11/18  Yes McGowan, Carollee Herter A, PA-C  gabapentin (NEURONTIN) 100 MG capsule Take 100 mg by mouth 3 (three) times daily. 02/06/20 02/05/21 Yes [provider]  magnesium oxide (MAG-OX) 400 MG tablet Take 400 mg by mouth daily.    Yes [provider]  Multiple Vitamin (MULTI-VITAMINS) TABS Take 1 tablet by mouth daily.    Yes [provider]  Omega-3 Fatty Acids (FISH OIL) 1000 MG CAPS Take 1,000 mg by mouth daily.   Yes [provider]  travoprost, benzalkonium, (TRAVATAN) 0.004 % ophthalmic solution Place 1 drop into both eyes at bedtime.    Yes [provider]    Review of Systems  Constitutional: Positive for fatigue (minimal). Negative for appetite change.  HENT: Negative for congestion, postnasal drip and sore throat.   Eyes: Negative.   Respiratory: Negative for cough, chest tightness and shortness of breath.   Cardiovascular: Negative for chest pain, palpitations and leg swelling.  Gastrointestinal: Negative for abdominal distention and abdominal pain.  Endocrine: Negative.   Genitourinary: Negative.   Musculoskeletal: Positive for back pain. Negative for arthralgias and neck pain.  Skin: Negative.   Allergic/Immunologic: Negative.   Neurological: Positive for numbness (tingling in feet due to neuropathy). Negative for dizziness and light-headedness.  Hematological: Negative for adenopathy. Does not bruise/bleed easily.  Psychiatric/Behavioral: Negative for dysphoric mood and sleep disturbance. The patient is not nervous/anxious.    Vitals:   04/24/20 0836  BP: (!) 126/58  Pulse: 62  Resp: 16  SpO2: 99%  Weight: 229 lb 6 oz (104 kg)  Height: 5\' 4"  (1.626 m)   Wt Readings from Last 3 Encounters:  04/24/20 229 lb 6 oz (104 kg)  10/27/19 227 lb (103 kg)  10/26/19 226 lb (102.5 kg)   Lab Results  Component Value Date   CREATININE 1.50 (H) 05/06/2019   CREATININE  1.11 (H) 01/15/2019   CREATININE 1.42 (H) 11/19/2018    Physical Exam  Constitutional: She is oriented to person, place, and time. She appears well-developed and well-nourished.  HENT:  Head: Normocephalic and atraumatic.  Neck: No JVD present.  Cardiovascular: Normal rate and regular rhythm.  Pulmonary/Chest: Effort normal. She has no wheezes. She has no rales.  Abdominal: Soft. She exhibits no distension. There is no abdominal tenderness.  Musculoskeletal:        General: Edema (trace pitting around both ankles) present. No tenderness.     Cervical back: Normal range of motion and neck supple.  Neurological: She is alert and oriented to person, place, and time.  Skin: Skin is warm and dry.  Psychiatric: She has a normal mood and affect. Her behavior is normal. Thought content normal.  Nursing note and vitals reviewed.  Assessment & Plan:  1: Chronic heart failure with now preserved ejection fraction- - NYHA class II - euvolemic today -  not weighing daily; encouraged her to resume daily weighing so that she can call for an overnight weight gain of >2 pounds or a weekly weight gain of >5 pounds - weight up 3 pounds from last visit 6 months ago - trying to not add salt; reviewed the importance of closely following a 2000mg  sodium diet - drinks ~32 ounces of water daily along with a a soda or tea - saw cardiology (Fath) 06/09/18  - BNP 11/19/18 was 25.0 - wearing her compression boots as needed now - PharmD reconciled medications with the patient - she reports receiving both her COVID vaccines  2: HTN- - BP looks good today - saw PCP Raechel Ache) 03/01/20 - BMP from 02/02/20 reviewed and showed potassium 4.2, creatinine 1.3 and GFR 48  3: Diabetes- - fasting glucose in clinic this morning was 95 - A1c on 06/15/2019 was 6.4%  4: Obstructive sleep apnea- - sleep study was July 2019 - saw pulmonology Humphrey Rolls) 10/27/2019 - continues to sleep well wearing her CPAP    Patient did not  bring her medications nor a list. Each medication was verbally reviewed with the patient and she was encouraged to bring the bottles to every visit to confirm accuracy of list.  Due to HF stability, will not make a return appointment at this time. Advised patient that she could call back at anytime to make another appointment and patient was comfortable with this plan.

## 2020-04-24 ENCOUNTER — Other Ambulatory Visit: Payer: Self-pay

## 2020-04-24 ENCOUNTER — Ambulatory Visit: Payer: Medicare Other | Attending: Family | Admitting: Family

## 2020-04-24 ENCOUNTER — Encounter: Payer: Self-pay | Admitting: Family

## 2020-04-24 VITALS — BP 126/58 | HR 62 | Resp 16 | Ht 64.0 in | Wt 229.4 lb

## 2020-04-24 DIAGNOSIS — D649 Anemia, unspecified: Secondary | ICD-10-CM | POA: Diagnosis not present

## 2020-04-24 DIAGNOSIS — I10 Essential (primary) hypertension: Secondary | ICD-10-CM

## 2020-04-24 DIAGNOSIS — G4733 Obstructive sleep apnea (adult) (pediatric): Secondary | ICD-10-CM | POA: Insufficient documentation

## 2020-04-24 DIAGNOSIS — I272 Pulmonary hypertension, unspecified: Secondary | ICD-10-CM | POA: Diagnosis not present

## 2020-04-24 DIAGNOSIS — I429 Cardiomyopathy, unspecified: Secondary | ICD-10-CM | POA: Insufficient documentation

## 2020-04-24 DIAGNOSIS — I5032 Chronic diastolic (congestive) heart failure: Secondary | ICD-10-CM | POA: Insufficient documentation

## 2020-04-24 DIAGNOSIS — N3281 Overactive bladder: Secondary | ICD-10-CM | POA: Insufficient documentation

## 2020-04-24 DIAGNOSIS — F0390 Unspecified dementia without behavioral disturbance: Secondary | ICD-10-CM | POA: Diagnosis not present

## 2020-04-24 DIAGNOSIS — I110 Hypertensive heart disease with heart failure: Secondary | ICD-10-CM | POA: Diagnosis not present

## 2020-04-24 DIAGNOSIS — Z833 Family history of diabetes mellitus: Secondary | ICD-10-CM | POA: Diagnosis not present

## 2020-04-24 DIAGNOSIS — Z79899 Other long term (current) drug therapy: Secondary | ICD-10-CM | POA: Diagnosis not present

## 2020-04-24 DIAGNOSIS — Z888 Allergy status to other drugs, medicaments and biological substances status: Secondary | ICD-10-CM | POA: Diagnosis not present

## 2020-04-24 DIAGNOSIS — Z7982 Long term (current) use of aspirin: Secondary | ICD-10-CM | POA: Insufficient documentation

## 2020-04-24 DIAGNOSIS — E785 Hyperlipidemia, unspecified: Secondary | ICD-10-CM | POA: Insufficient documentation

## 2020-04-24 DIAGNOSIS — E1142 Type 2 diabetes mellitus with diabetic polyneuropathy: Secondary | ICD-10-CM | POA: Insufficient documentation

## 2020-04-24 DIAGNOSIS — Z8249 Family history of ischemic heart disease and other diseases of the circulatory system: Secondary | ICD-10-CM | POA: Diagnosis not present

## 2020-04-24 DIAGNOSIS — E119 Type 2 diabetes mellitus without complications: Secondary | ICD-10-CM

## 2020-04-24 LAB — GLUCOSE, CAPILLARY: Glucose-Capillary: 95 mg/dL (ref 70–99)

## 2020-04-24 NOTE — Progress Notes (Signed)
[facility] - HEART FAILURE CLINIC - PHARMACIST COUNSELING NOTE  ADHERENCE ASSESSMENT  Adherence strategy: Utilizes a pillbox at home. Patient receives medications from the [facility]. Receives them in the mail.    Do you ever forget to take your medication? [] Yes (1) [x] No (0)  Do you ever skip doses due to side effects? [] Yes (1) [x] No (0)  Do you have trouble affording your medicines? [] Yes (1) [x] No (0)  Are you ever unable to pick up your medication due to transportation difficulties? [] Yes (1) [x] No (0)  Do you ever stop taking your medications because you don't believe they are helping? [] Yes (1) [x] No (0)  Total score 0   Recommendations given to patient about increasing adherence: None needed  Guideline-Directed Medical Therapy/Evidence Based Medicine  ACE/ARB/ARNI: candesartan 32 mg daily Beta Blocker: carvedilol 25 mg BID Aldosterone Antagonist: none Diuretic: none    SUBJECTIVE  HPI: Patient is a 79 y/o F with PMH as below who presents to CHF clinic for follow-up  Past Medical History:  Diagnosis Date  . Anemia   . Cardiomyopathy (HCC)    Ejection Fraction 30-35% per ECHO 2016  . CHF (congestive heart failure) (HCC)   . Diabetes (HCC)   . Dysrhythmia   . Hyperlipidemia   . Hypertension   . Peripheral neuropathy   . Peripheral neuropathy   . Sleep apnea       OBJECTIVE   Vital signs: HR 62, BP 126/58, weight (pounds) 229 ECHO: Date 07/25/19, EF 55-60%, notes: mildly increased LV wall thickness, impaired LV relaxation  BMP Latest Ref Rng & Units 05/06/2019 01/15/2019 11/19/2018  Glucose 70 - 99 mg/dL - 97 )  BUN 8 - 23 mg/dL - 19 )  Creatinine 0.44 - 1.00 mg/dL ) 62) 05-26-1981)  BUN/Creat Ratio 12 - 28 - - -  Sodium 135 - 145 mmol/L - 139 141  Potassium 3.5 - 5.1 mmol/L - 4.2 4.2  Chloride 98 - 111 mmol/L - 104 106  CO2 22 - 32 mmol/L - 32 30  Calcium 8.9 - 10.3 mg/dL - 9.6 9.7    ASSESSMENT  Patient is  well appearing in no acute distress. She endorses taking medications daily as prescribed. Denies missed doses or adverse effects of therapy. She reports weighing herself every couple of weeks and that weight has been stable. Checks blood pressure at home infrequently. Reports she still salts foods but less than she used to.   PLAN  1). Diastolic HF -Encouraged decreasing salt intake -Encouraged daily weights -Continue compression boots as needed  2). Hypertension -Normotensive today -Antihypertensives include amlodipine 10 mg daily, candesartan 32 mg daily, carvedilol 25 mg BID -Denies signs/symptoms of hypotension / hypertension  3). T2DM -Last HgbA1c was 6.9% on 02/02/20 which had increased from 6.4% on 06/15/19 -Lifestyle management only. Was on metformin previously but no longer taking  4). Hyperlipidemia -Lipid panel on 02/02/20 with TG 81, HDL 58.5, LDL 87 -Atorvastatin 80 mg daily + fish oil -Denies myalgias  5). Anemia -Hgb 12.9 on 02/02/20 -Ferrous sulfate 325 mg one tablet q2-3 days  6). Neuropathic pain -Gabapentin 100 mg BID (prescribed TID)  7). Mild dementia -Donepezil 10 mg HS -Caution fesoterodine (anticholinergic)  8). Overactive bladder -Fesoterodine ER 8 mg tablet daily  9). Hypomagnesemia -Magnesium oxide 400 mg daily   Time spent: 15 minutes  11/21/2018 Pharmacy Resident 04/24/2020 8:33 AM    Current Outpatient Medications:  .  amLODipine (NORVASC) 5 MG tablet, Take 10 mg  by mouth daily. , Disp: , Rfl:  .  aspirin EC 81 MG tablet, Take 81 mg by mouth daily., Disp: , Rfl:  .  atorvastatin (LIPITOR) 80 MG tablet, Take 80 mg by mouth daily. , Disp: , Rfl:  .  brimonidine (ALPHAGAN) 0.2 % ophthalmic solution, Place 1 drop into both eyes 2 (two) times daily. , Disp: , Rfl:  .  candesartan (ATACAND) 32 MG tablet, Take by mouth daily. , Disp: , Rfl:  .  carvedilol (COREG) 25 MG tablet, Take by mouth 2 (two) times daily with a meal. , Disp: , Rfl:   .  conjugated estrogens (PREMARIN) vaginal cream, Place 1 Applicatorful vaginally daily. Apply 0.5mg  (pea-sized amount)  just inside the vaginal introitus with a finger-tip every night for two weeks and then Monday, Wednesday and Friday nights., Disp: 30 g, Rfl: 12 .  cyclobenzaprine (FEXMID) 7.5 MG tablet, Take 1 tablet (7.5 mg total) by mouth 2 (two) times daily as needed for muscle spasms., Disp: 14 tablet, Rfl: 0 .  donepezil (ARICEPT) 10 MG tablet, Take 10 mg by mouth at bedtime., Disp: , Rfl:  .  ferrous sulfate 325 (65 FE) MG tablet, Take by mouth. Takes one pill every 2-3 days to minimize constipation, Disp: , Rfl:  .  fesoterodine (TOVIAZ) 8 MG TB24 tablet, Take 1 tablet (8 mg total) by mouth daily., Disp: 90 tablet, Rfl: 4 .  magnesium oxide (MAG-OX) 400 MG tablet, Take 400 mg by mouth daily. , Disp: , Rfl:  .  Multiple Vitamin (MULTI-VITAMINS) TABS, Take 1 tablet by mouth daily. , Disp: , Rfl:  .  Omega-3 Fatty Acids (FISH OIL) 1000 MG CAPS, Take 1,000 mg by mouth daily., Disp: , Rfl:  .  pantoprazole (PROTONIX) 40 MG tablet, Take 40 mg by mouth daily. , Disp: , Rfl:  .  travoprost, benzalkonium, (TRAVATAN) 0.004 % ophthalmic solution, Place 1 drop into both eyes at bedtime. , Disp: , Rfl:    COUNSELING POINTS/CLINICAL PEARLS  Carvedilol (Goal: weight less than 85 kg is 25 mg BID, weight greater than 85 kg is 50 mg BID)  Patient should avoid activities requiring coordination until drug effects are realized, as drug may cause dizziness.  This drug may cause diarrhea, nausea, vomiting, arthralgia, back pain, myalgia, headache, vision disorder, erectile dysfunction, reduced libido, or fatigue.  Instruct patient to report signs/symptoms of adverse cardiovascular effects such as hypotension (especially in elderly patients), arrhythmias, syncope, palpitations, angina, or edema.  Drug may mask symptoms of hypoglycemia. Advise diabetic patients to carefully monitor blood sugar levels.  Patient  should take drug with food.  Advise patient against sudden discontinuation of drug.  DRUGS TO AVOID IN HEART FAILURE  Drug or Class Mechanism  Analgesics . NSAIDs . COX-2 inhibitors . Glucocorticoids  Sodium and water retention, increased systemic vascular resistance, decreased response to diuretics   Diabetes Medications . Metformin . Thiazolidinediones o Rosiglitazone (Avandia) o Pioglitazone (Actos) . DPP4 Inhibitors o Saxagliptin (Onglyza) o Sitagliptin (Januvia)   Lactic acidosis Possible calcium channel blockade   Unknown  Antiarrhythmics . Class I  o Flecainide o Disopyramide . Class III o Sotalol . Other o Dronedarone  Negative inotrope, proarrhythmic   Proarrhythmic, beta blockade  Negative inotrope  Antihypertensives . Alpha Blockers o Doxazosin . Calcium Channel Blockers o Diltiazem o Verapamil o Nifedipine . Central Alpha Adrenergics o Moxonidine . Peripheral Vasodilators o Minoxidil  Increases renin and aldosterone  Negative inotrope    Possible sympathetic withdrawal  Unknown  Anti-infective . Itraconazole . Amphotericin B  Negative inotrope Unknown  Hematologic . Anagrelide . Cilostazol   Possible inhibition of PD IV Inhibition of PD III causing arrhythmias  Neurologic/Psychiatric . Stimulants . Anti-Seizure Drugs o Carbamazepine o Pregabalin . Antidepressants o Tricyclics o Citalopram . Parkinsons o Bromocriptine o Pergolide o Pramipexole . Antipsychotics o Clozapine . Antimigraine o Ergotamine o Methysergide . Appetite suppressants . Bipolar o Lithium  Peripheral alpha and beta agonist activity  Negative inotrope and chronotrope Calcium channel blockade  Negative inotrope, proarrhythmic Dose-dependent QT prolongation  Excessive serotonin activity/valvular damage Excessive serotonin activity/valvular damage Unknown  IgE mediated hypersensitivy, calcium channel blockade  Excessive serotonin  activity/valvular damage Excessive serotonin activity/valvular damage Valvular damage  Direct myofibrillar degeneration, adrenergic stimulation  Antimalarials . Chloroquine . Hydroxychloroquine Intracellular inhibition of lysosomal enzymes  Urologic Agents . Alpha Blockers o Doxazosin o Prazosin o Tamsulosin o Terazosin  Increased renin and aldosterone  Adapted from Page RL, et al. "Drugs That May Cause or Exacerbate Heart Failure: A Scientific Statement from the American Heart  Association." Circulation 2016; 134:e32-e69. DOI: 10.1161/CIR.0000000000000426   MEDICATION ADHERENCES TIPS AND STRATEGIES 1. Taking medication as prescribed improves patient outcomes in heart failure (reduces hospitalizations, improves symptoms, increases survival) 2. Side effects of medications can be managed by decreasing doses, switching agents, stopping drugs, or adding additional therapy. Please let someone in the Heart Failure Clinic know if you have having bothersome side effects so we can modify your regimen. Do not alter your medication regimen without talking to [place].  3. Medication reminders can help patients remember to take drugs on time. If you are missing or forgetting doses you can try linking behaviors, using pill boxes, or an electronic reminder like an alarm on your phone or an app. Some people can also get automated phone calls as medication reminders.

## 2020-04-24 NOTE — Patient Instructions (Addendum)
Resume weighing daily and call for an overnight weight gain of > 2 pounds or a weekly weight gain of >5 pounds.   Call us in the future if you'd like to schedule another appointment 

## 2020-04-25 ENCOUNTER — Other Ambulatory Visit: Payer: Self-pay

## 2020-04-25 ENCOUNTER — Ambulatory Visit (INDEPENDENT_AMBULATORY_CARE_PROVIDER_SITE_OTHER): Payer: Medicare Other

## 2020-04-25 DIAGNOSIS — G4733 Obstructive sleep apnea (adult) (pediatric): Secondary | ICD-10-CM

## 2020-04-25 NOTE — Progress Notes (Signed)
95 percentile pressure 12   95th percentile leak 8.3   apnea index 5.0 /hr  apnea-hypopnea index  6.5 /hr   total days used  >4 hr 336 days  total days used <4 hr 17 days  Total compliance 97 percent

## 2020-04-26 ENCOUNTER — Ambulatory Visit: Payer: Medicare Other | Admitting: Internal Medicine

## 2020-04-27 ENCOUNTER — Telehealth: Payer: Self-pay

## 2020-04-27 NOTE — Telephone Encounter (Signed)
Patient rescheduled appointment on 05/01/2020 to 05/14/2020. klh 

## 2020-05-01 ENCOUNTER — Ambulatory Visit: Payer: Medicare Other | Admitting: Adult Health

## 2020-05-01 NOTE — Progress Notes (Incomplete)
05/02/20 11:37 PM   Shelby [name] [birth year] [id number]  Referring provider: Mickey Farber, MD [address] [facility] [place],  [place] [address] No chief complaint on file.   HPI: Shelby [name] is a 79 y.o. F who returns today for follow-up MRI of her renal mass.  She was initially seen and evaluated for right ureteral calculus which she subsequently passed. On this imaging, she did have multiple indeterminate renal lesions. She underwent further evaluation in the form of MRI of the abdomen with and without contrast for further characterization.  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 No lymphadenopathy or vein involvement.  Repeat MRI in 04/2019 shows essentially stable 10 mm enhancing renal lesion in the interpolar right kidney.  She has Bosniak 1 and 2 renal cyst in addition to this.  She also has a stable 1.5 cm enhancing lesion at the dome of the bladder which is felt to be likely benign.  MRI from 04/03/20 revealed stable small enhancing 9 mm lesion partially exophytic from the mid RIGHT kidney most consistent solid renal neoplasm. Multiple additional bilateral Bosniak 1 renal cysts. Mild enlargement of small 8 mm cystic lesion in the head of the pancreas. New nodules at the LEFT lung base. Recommend dedicated CT of the thorax without contrast to compared to CT 01/15/2019. Stable enhancing lesion in the in the dome liver. Stability favors benign lesion.  CT Chest w/o contrast revealed shifting areas of nodularity in the LEFT lung base likely infectious or inflammatory. Resolution of the dominant area of concern in the LEFT lung base. Hemorrhagic cyst in the upper pole the LEFT kidney previously characterized on MRI is slightly increased in size  compared to the more remote CT studies. Enlargement of the RIGHT thyroid lobe with calcification.  Overall, she is doing fair.  She has multiple medical comorbidities as listed below.  She is been no significant change in her medical history.  No flank pain or gross hematuria.  No weight loss.  PMH: Past Medical History:  Diagnosis Date  . Anemia   . Cardiomyopathy (HCC)    Ejection Fraction 30-35% per ECHO 2016  . CHF (congestive heart failure) (HCC)   . Diabetes (HCC)   . Dysrhythmia   . Hyperlipidemia   . Hypertension   . Peripheral neuropathy   . Peripheral neuropathy   . Sleep apnea     Surgical History: Past Surgical History:  Procedure Laterality Date  . ABDOMINAL HYSTERECTOMY    . COLONOSCOPY    . COLONOSCOPY N/A 04/01/2018   Procedure: COLONOSCOPY;  Surgeon: Christena Deem, MD;  Location: [facility] ENDOSCOPY;  Service: Endoscopy;  Laterality: N/A;  . COLONOSCOPY WITH PROPOFOL N/A 12/29/2017   Procedure: COLONOSCOPY WITH PROPOFOL;  Surgeon: Christena Deem, MD;  Location: [facility] ENDOSCOPY;  Service: Endoscopy;  Laterality: N/A;  . ESOPHAGOGASTRODUODENOSCOPY (EGD) WITH PROPOFOL N/A 12/29/2017   Procedure: ESOPHAGOGASTRODUODENOSCOPY (EGD) WITH PROPOFOL;  Surgeon: Christena Deem, MD;  Location: [facility] ENDOSCOPY;  Service: Endoscopy;  Laterality: N/A;  . ESOPHAGOGASTRODUODENOSCOPY (EGD) WITH PROPOFOL N/A 04/01/2018   Procedure: ESOPHAGOGASTRODUODENOSCOPY (EGD) WITH PROPOFOL;  Surgeon: Christena Deem, MD;  Location: [facility] ENDOSCOPY;  Service: Endoscopy;  Laterality: N/A;  . EUS N/A 05/20/2018   Procedure: ESOPHAGEAL ENDOSCOPIC ULTRASOUND (EUS) RADIAL;  Surgeon: Burbridge, Prudencio Pair, MD;  Location: [facility] ENDOSCOPY;  Service: Gastroenterology;  Laterality: N/A;    Home Medications:  Allergies as of 05/02/2020      Reactions   Mirabegron Other (See Comments)   Elevated BP   Furosemide Other (See Comments)   Legs swelling      Medication List       Accurate as of May 01, 2020 11:37 PM. If you have any questions, ask your nurse or doctor.        amLODipine 5 MG tablet Commonly known as: NORVASC Take 10 mg by mouth daily.   ascorbic acid 500 MG tablet Commonly known as: VITAMIN C Take 500 mg by mouth daily.   aspirin EC 81 MG tablet Take 81 mg by mouth daily.   atorvastatin 80 MG tablet Commonly known as: LIPITOR Take 80 mg by mouth daily.   brimonidine 0.2 % ophthalmic solution Commonly known as: ALPHAGAN Place 1 drop into both eyes 2 (two) times daily.   candesartan 32 MG tablet Commonly known as: ATACAND Take by mouth daily.   carvedilol 25 MG tablet Commonly known as: COREG Take by mouth 2 (two) times daily with a meal.   donepezil 10 MG tablet Commonly known as: ARICEPT Take 10 mg by mouth at bedtime.   ferrous sulfate 325 (65 FE) MG tablet Take by mouth. Takes one pill every 2-3 days to minimize constipation   fesoterodine 8 MG Tb24 tablet Commonly known as: TOVIAZ Take 1 tablet (8 mg total) by mouth daily.   Fish Oil 1000 MG Caps Take 1,000 mg by mouth daily.   gabapentin 100 MG capsule Commonly known as: NEURONTIN Take 100 mg by mouth 3 (three) times daily.   magnesium oxide 400 MG tablet Commonly known as: MAG-OX Take 400 mg by mouth daily.   Multi-Vitamins Tabs Take 1 tablet by mouth daily.   travoprost (benzalkonium) 0.004 % ophthalmic solution Commonly known as: TRAVATAN Place 1 drop into both eyes at bedtime.       Allergies:  Allergies  Allergen Reactions  . Mirabegron Other (See Comments)    Elevated BP  . Furosemide Other (See Comments)    Legs swelling     Family History: Family History  Problem Relation Age of Onset  . Diabetes Mother   . Heart disease Mother   . Bladder Cancer Neg Hx   . Kidney cancer Neg Hx     Social History:  reports that she has never smoked. She has never used smokeless tobacco. She reports that she does not drink alcohol or use drugs.   Physical  Exam: There were no vitals taken for this visit.  Constitutional:  Alert and oriented, No acute distress. HEENT: [place] AT, moist mucus membranes.  Trachea midline, no masses. Cardiovascular: No clubbing, cyanosis, or edema. Respiratory: Normal respiratory effort, no increased work of breathing. GI: Abdomen is soft, nontender, nondistended, no abdominal masses GU: No CVA tenderness Lymph: No cervical or inguinal lymphadenopathy. Skin: No rashes, bruises or suspicious lesions. Neurologic: Grossly intact, no focal deficits, moving all 4 extremities. Psychiatric: Normal mood and affect.  Laboratory Data:  Urinalysis   Pertinent Imaging: CLINICAL DATA:  Follow-up RIGHT renal lesion.  EXAM: MRI ABDOMEN WITHOUT AND WITH CONTRAST  TECHNIQUE: Multiplanar multisequence MR imaging of the abdomen was performed both before and after the administration of intravenous contrast.  CONTRAST:  65mL GADAVIST GADOBUTROL 1 MMOL/ML IV SOLN  COMPARISON:  MRI, 05/06/2019, chest CT 01/15/2019  FINDINGS: Lower chest: Cluster of nodules at the LEFT lung base (image 9/5)  not clearly seen on comparison CT.  Hepatobiliary: Enhancing lesion in the dome of the liver is poorly demarcated noncontrast exam (image 6/16) and measures approximately 16 mm x 13 mm. No change from comparison exam. TRUFISP imaging (series 10) lesion is better delineated and measures 11 mm (unchanged).  Gallbladder normal.  Common bile duct normal  Pancreas: Cystic lesion in the head of the pancreas measuring 8 mm (image 24/5) and is increased from 6 mm. No clear communication with the pancreatic duct. The pancreatic duct is normal caliber. No pancreatic inflammation  Spleen: Normal spleen  Adrenals/urinary tract: Adrenal glands normal.  Partially exophytic from the mid RIGHT kidney is an 9 x 9 mm enhancing lesion (image 49/12) which is not changed from 9 mm x 9 mm on comparison MRI 05/06/2019.  Additional  nonenhancing Bosniak I renal cysts in LEFT and RIGHT kidney also unchanged.  Stomach/Bowel: Stomach, small bowel, appendix, and cecum are normal. The colon and rectosigmoid colon are normal.  Vascular/Lymphatic: Abdominal aorta is normal caliber. No periportal or retroperitoneal adenopathy. No pelvic adenopathy.  Other: No free fluid.  Musculoskeletal: No aggressive osseous lesion.  IMPRESSION: 1. Stable small enhancing 9 mm lesion partially exophytic from the mid RIGHT kidney most consistent solid renal neoplasm. 2. Multiple additional bilateral Bosniak 1 renal cysts 3. Mild enlargement of small 8 mm cystic lesion in the head of the pancreas. Recommend follow-up MRI 12 months per consensus criteria. This recommendation follows ACR consensus guidelines: Management of Incidental Pancreatic Cysts: A White Paper of the ACR Incidental Findings Committee. J Am Coll Radiol 2017;14:911-923. 4. New nodules at the LEFT lung base. Recommend dedicated CT of the thorax without contrast to compared to CT 01/15/2019. 5. Stable enhancing lesion in the in the dome liver. Stability favors benign lesion.  These results will be called to the ordering clinician or representative by the Radiologist Assistant, and communication documented in the PACS or [employer].   Electronically Signed   By: Genevive Bi M.D.   On: 04/03/2020 16:55  CLINICAL DATA:  Abnormal x-ray, follow-up nodule  EXAM: CT CHEST WITHOUT CONTRAST  TECHNIQUE: Multidetector CT imaging of the chest was performed following the standard protocol without IV contrast.  COMPARISON:  01/15/2019  FINDINGS: Cardiovascular: Calcified atheromatous plaque throughout the thoracic aorta. Moderate in terms of severity without aortic dilation. Limited assessment of vascular structures in the chest due to lack of intravenous contrast. Dilated main pulmonary artery to 3.2 cm. Slight increase in size of a small  pericardial effusion. Heart size is stable with enlargement of LEFT atrium and LEFT ventricle likely.  Mediastinum/Nodes: Enlargement of RIGHT thyroid lobe with calcification. Heterogeneity measuring approximately 2.9 x 2.6 cm no visible adenopathy at the thoracic inlet on noncontrast imaging. No axillary lymphadenopathy. Esophagus is unremarkable.  Lungs/Pleura: Basilar atelectasis. Subtle nodular densities at the LEFT lung base have developed since the prior study.  LEFT lower lobe nodule (image 98, series 3) 3 mm previously 6-7 mm greatest size. New areas of nodularity in the dependent LEFT chest with tree-in-bud opacities extending peripherally.  There was another nodule in the LEFT lung base on the prior study measuring as much as 8 mm. This has resolved.  Upper Abdomen: Hemorrhagic cyst in the upper pole the LEFT kidney previously characterized on MRI is slightly increased in size compared to the more remote CT studies unchanged compared to most recent MRI of April 03, 2020  Musculoskeletal: Spinal degenerative changes without acute or destructive bone process.  IMPRESSION: 1. Shifting  areas of nodularity in the LEFT lung base likely infectious or inflammatory. Resolution of the dominant area of concern in the LEFT lung base. 2. Hemorrhagic cyst in the upper pole the LEFT kidney previously characterized on MRI is slightly increased in size compared to the more remote CT studies. 3. Enlargement of the RIGHT thyroid lobe with calcification. Recommend thyroid ultrasound (ref: J Am Coll Radiol. 2015 Feb;12(2): 143-50). 4. Aortic atherosclerosis.  Aortic Atherosclerosis (ICD10-I70.0).   Electronically Signed   By: Zetta Bills M.D.   On: 04/18/2020 16:20  I have personally reviewed the images and agree with radiologist interpretation.   Assessment & Plan:     No follow-ups on file.  [facility] [address] [place], [place] [address] [phone]  I, Lucas Mallow, am acting as a scribe for Dr. Hollice Espy,  {Add Scribe Attestation Statement}

## 2020-05-02 ENCOUNTER — Ambulatory Visit: Payer: Medicare Other | Admitting: Urology

## 2020-05-14 ENCOUNTER — Other Ambulatory Visit: Payer: Self-pay

## 2020-05-14 ENCOUNTER — Encounter: Payer: Self-pay | Admitting: Internal Medicine

## 2020-05-14 ENCOUNTER — Telehealth: Payer: Self-pay

## 2020-05-14 ENCOUNTER — Ambulatory Visit (INDEPENDENT_AMBULATORY_CARE_PROVIDER_SITE_OTHER): Payer: Medicare Other | Admitting: Internal Medicine

## 2020-05-14 VITALS — BP 112/59 | HR 63 | Temp 96.5°F | Resp 16 | Ht 64.0 in | Wt 230.6 lb

## 2020-05-14 DIAGNOSIS — I10 Essential (primary) hypertension: Secondary | ICD-10-CM | POA: Diagnosis not present

## 2020-05-14 DIAGNOSIS — I429 Cardiomyopathy, unspecified: Secondary | ICD-10-CM | POA: Diagnosis not present

## 2020-05-14 DIAGNOSIS — Z6839 Body mass index (BMI) 39.0-39.9, adult: Secondary | ICD-10-CM | POA: Diagnosis not present

## 2020-05-14 DIAGNOSIS — Z9989 Dependence on other enabling machines and devices: Secondary | ICD-10-CM

## 2020-05-14 DIAGNOSIS — G4733 Obstructive sleep apnea (adult) (pediatric): Secondary | ICD-10-CM | POA: Diagnosis not present

## 2020-05-14 NOTE — Telephone Encounter (Signed)
Faxed cpap supply order to [facility]. Beth

## 2020-05-14 NOTE — Progress Notes (Signed)
[facility] [address] [place], [place] [address]  Pulmonary Sleep Medicine   Office Visit Note  Patient Name: Shelby [name] DOB: [birth year] MRN [id number]  Date of Service: 05/14/2020  Complaints/HPI: Pt is here for 6 month follow up for pulmonary.  She has a history of OSA that she uses cpap for.  She Has lost a clip to her mask and could not wear her mask last night.  She will need this replaced.  Based on download from last month she has a 97% compliance.  She does have an overall ahi of 6.5. She reports feeling good, and sleeping for more than 6 hours when wearing her machine.     ROS  General: (-) fever, (-) chills, (-) night sweats, (-) weakness Skin: (-) rashes, (-) itching,. Eyes: (-) visual changes, (-) redness, (-) itching. Nose and Sinuses: (-) nasal stuffiness or itchiness, (-) postnasal drip, (-) nosebleeds, (-) sinus trouble. Mouth and Throat: (-) sore throat, (-) hoarseness. Neck: (-) swollen glands, (-) enlarged thyroid, (-) neck pain. Respiratory: - cough, (-) bloody sputum, - shortness of breath, - wheezing. Cardiovascular: - ankle swelling, (-) chest pain. Lymphatic: (-) lymph node enlargement. Neurologic: (-) numbness, (-) tingling. Psychiatric: (-) anxiety, (-) depression   Current Medication: Outpatient Encounter Medications as of 05/14/2020  Medication Sig Note   amLODipine (NORVASC) 5 MG tablet Take 10 mg by mouth daily.     ascorbic acid (VITAMIN C) 500 MG tablet Take 500 mg by mouth daily.    aspirin EC 81 MG tablet Take 81 mg by mouth daily.    atorvastatin (LIPITOR) 80 MG tablet Take 80 mg by mouth daily.     brimonidine (ALPHAGAN) 0.2 % ophthalmic solution Place 1 drop into both eyes 2 (two) times daily.     candesartan (ATACAND) 32 MG tablet Take by mouth daily.     carvedilol (COREG) 25 MG tablet Take by mouth 2 (two) times daily with a meal.     donepezil (ARICEPT) 10 MG tablet Take 10 mg by mouth at bedtime.     ferrous sulfate 325 (65 FE) MG tablet Take by mouth. Takes one pill every 2-3 days to minimize constipation    fesoterodine (TOVIAZ) 8 MG TB24 tablet Take 1 tablet (8 mg total) by mouth daily.    gabapentin (NEURONTIN) 100 MG capsule Take 100 mg by mouth 3 (three) times daily. 04/24/2020: Taking twice / day   magnesium oxide (MAG-OX) 400 MG tablet Take 400 mg by mouth daily.     Multiple Vitamin (MULTI-VITAMINS) TABS Take 1 tablet by mouth daily.     Omega-3 Fatty Acids (FISH OIL) 1000 MG CAPS Take 1,000 mg by mouth daily.    travoprost, benzalkonium, (TRAVATAN) 0.004 % ophthalmic solution Place 1 drop into both eyes at bedtime.     No facility-administered encounter medications on file as of 05/14/2020.    Surgical History: Past Surgical History:  Procedure Laterality Date   ABDOMINAL HYSTERECTOMY     COLONOSCOPY     COLONOSCOPY N/A 04/01/2018   Procedure: COLONOSCOPY;  Surgeon: Christena Deem, MD;  Location: [facility] ENDOSCOPY;  Service: Endoscopy;  Laterality: N/A;   COLONOSCOPY WITH PROPOFOL N/A 12/29/2017   Procedure: COLONOSCOPY WITH PROPOFOL;  Surgeon: Christena Deem, MD;  Location: [facility] ENDOSCOPY;  Service: Endoscopy;  Laterality: N/A;   ESOPHAGOGASTRODUODENOSCOPY (EGD) WITH PROPOFOL N/A 12/29/2017   Procedure: ESOPHAGOGASTRODUODENOSCOPY (EGD) WITH PROPOFOL;  Surgeon: Christena Deem, MD;  Location: [facility] ENDOSCOPY;  Service: Endoscopy;  Laterality: N/A;   ESOPHAGOGASTRODUODENOSCOPY (EGD) WITH PROPOFOL N/A 04/01/2018   Procedure: ESOPHAGOGASTRODUODENOSCOPY (EGD) WITH PROPOFOL;  Surgeon: Lollie Sails, MD;  Location: [facility] ENDOSCOPY;  Service: Endoscopy;  Laterality: N/A;   EUS N/A 05/20/2018   Procedure: ESOPHAGEAL ENDOSCOPIC ULTRASOUND (EUS) RADIAL;  Surgeon: Holly Bodily, MD;  Location: [facility] ENDOSCOPY;  Service: Gastroenterology;  Laterality: N/A;    Medical History: Past Medical History:  Diagnosis Date   Anemia    Cardiomyopathy ([place])    Ejection  Fraction 30-35% per ECHO 2016   CHF (congestive heart failure) (HCC)    Diabetes ([place])    Dysrhythmia    Hyperlipidemia    Hypertension    Peripheral neuropathy    Peripheral neuropathy    Sleep apnea     Family History: Family History  Problem Relation Age of Onset   Diabetes Mother    Heart disease Mother    Bladder Cancer Neg Hx    Kidney cancer Neg Hx     Social History: Social History   Socioeconomic History   Marital status: Widowed    Spouse name: Not on file   Number of children: Not on file   Years of education: Not on file   Highest education level: Not on file  Occupational History   Not on file  Tobacco Use   Smoking status: Never Smoker   Smokeless tobacco: Never Used  Substance and Sexual Activity   Alcohol use: No   Drug use: No   Sexual activity: Yes    Birth control/protection: Post-menopausal  Other Topics Concern   Not on file  Social History Narrative   Not on file   Social Determinants of Health   Financial Resource Strain:    Difficulty of Paying Living Expenses:   Food Insecurity:    Worried About Charity fundraiser in the Last Year:    Arboriculturist in the Last Year:   Transportation Needs:    Film/video editor (Medical):    Lack of Transportation (Non-Medical):   Physical Activity:    Days of Exercise per Week:    Minutes of Exercise per Session:   Stress:    Feeling of Stress :   Social Connections:    Frequency of Communication with Friends and Family:    Frequency of Social Gatherings with Friends and Family:    Attends Religious Services:    Active Member of Clubs or Organizations:    Attends Music therapist:    Marital Status:   Intimate Partner Violence:    Fear of Current or Ex-Partner:    Emotionally Abused:    Physically Abused:    Sexually Abused:     Vital Signs: Blood pressure (!) 112/59, pulse 63, temperature (!) 96.5 F (35.8 C), resp. rate  16, height 5\' 4"  (1.626 m), weight 230 lb 9.6 oz (104.6 kg), SpO2 93 %.  Examination: General Appearance: The patient is well-developed, well-nourished, and in no distress. Skin: Gross inspection of skin unremarkable. Head: normocephalic, no gross deformities. Eyes: no gross deformities noted. ENT: ears appear grossly normal no exudates. Neck: Supple. No thyromegaly. No LAD. Respiratory: clear bilaterally. Cardiovascular: Normal S1 and S2 without murmur or rub. Extremities: No cyanosis. pulses are equal. Neurologic: Alert and oriented. No involuntary movements.  LABS: Recent Results (from the past 2160 hour(s))  Glucose, capillary     Status: None   Collection Time: 04/24/20  8:35 AM  Result Value Ref Range   Glucose-Capillary 95 70 -  99 mg/dL    Comment: Glucose reference range applies only to samples taken after fasting for at least 8 hours.    Radiology: CT CHEST WO CONTRAST  Result Date: 04/18/2020 CLINICAL DATA:  Abnormal x-ray, follow-up nodule EXAM: CT CHEST WITHOUT CONTRAST TECHNIQUE: Multidetector CT imaging of the chest was performed following the standard protocol without IV contrast. COMPARISON:  01/15/2019 FINDINGS: Cardiovascular: Calcified atheromatous plaque throughout the thoracic aorta. Moderate in terms of severity without aortic dilation. Limited assessment of vascular structures in the chest due to lack of intravenous contrast. Dilated main pulmonary artery to 3.2 cm. Slight increase in size of a small pericardial effusion. Heart size is stable with enlargement of LEFT atrium and LEFT ventricle likely. Mediastinum/Nodes: Enlargement of RIGHT thyroid lobe with calcification. Heterogeneity measuring approximately 2.9 x 2.6 cm no visible adenopathy at the thoracic inlet on noncontrast imaging. No axillary lymphadenopathy. Esophagus is unremarkable. Lungs/Pleura: Basilar atelectasis. Subtle nodular densities at the LEFT lung base have developed since the prior study. LEFT  lower lobe nodule (image 98, series 3) 3 mm previously 6-7 mm greatest size. New areas of nodularity in the dependent LEFT chest with tree-in-bud opacities extending peripherally. There was another nodule in the LEFT lung base on the prior study measuring as much as 8 mm. This has resolved. Upper Abdomen: Hemorrhagic cyst in the upper pole the LEFT kidney previously characterized on MRI is slightly increased in size compared to the more remote CT studies unchanged compared to most recent MRI of April 03, 2020 Musculoskeletal: Spinal degenerative changes without acute or destructive bone process. IMPRESSION: 1. Shifting areas of nodularity in the LEFT lung base likely infectious or inflammatory. Resolution of the dominant area of concern in the LEFT lung base. 2. Hemorrhagic cyst in the upper pole the LEFT kidney previously characterized on MRI is slightly increased in size compared to the more remote CT studies. 3. Enlargement of the RIGHT thyroid lobe with calcification. Recommend thyroid ultrasound (ref: J Am Coll Radiol. 2015 Feb;12(2): 143-50). 4. Aortic atherosclerosis. Aortic Atherosclerosis (ICD10-I70.0). Electronically Signed   By: Donzetta Kohut M.D.   On: 04/18/2020 16:20    No results found.  CT CHEST WO CONTRAST  Result Date: 04/18/2020 CLINICAL DATA:  Abnormal x-ray, follow-up nodule EXAM: CT CHEST WITHOUT CONTRAST TECHNIQUE: Multidetector CT imaging of the chest was performed following the standard protocol without IV contrast. COMPARISON:  01/15/2019 FINDINGS: Cardiovascular: Calcified atheromatous plaque throughout the thoracic aorta. Moderate in terms of severity without aortic dilation. Limited assessment of vascular structures in the chest due to lack of intravenous contrast. Dilated main pulmonary artery to 3.2 cm. Slight increase in size of a small pericardial effusion. Heart size is stable with enlargement of LEFT atrium and LEFT ventricle likely. Mediastinum/Nodes: Enlargement of RIGHT  thyroid lobe with calcification. Heterogeneity measuring approximately 2.9 x 2.6 cm no visible adenopathy at the thoracic inlet on noncontrast imaging. No axillary lymphadenopathy. Esophagus is unremarkable. Lungs/Pleura: Basilar atelectasis. Subtle nodular densities at the LEFT lung base have developed since the prior study. LEFT lower lobe nodule (image 98, series 3) 3 mm previously 6-7 mm greatest size. New areas of nodularity in the dependent LEFT chest with tree-in-bud opacities extending peripherally. There was another nodule in the LEFT lung base on the prior study measuring as much as 8 mm. This has resolved. Upper Abdomen: Hemorrhagic cyst in the upper pole the LEFT kidney previously characterized on MRI is slightly increased in size compared to the more remote CT studies unchanged compared to  most recent MRI of April 03, 2020 Musculoskeletal: Spinal degenerative changes without acute or destructive bone process. IMPRESSION: 1. Shifting areas of nodularity in the LEFT lung base likely infectious or inflammatory. Resolution of the dominant area of concern in the LEFT lung base. 2. Hemorrhagic cyst in the upper pole the LEFT kidney previously characterized on MRI is slightly increased in size compared to the more remote CT studies. 3. Enlargement of the RIGHT thyroid lobe with calcification. Recommend thyroid ultrasound (ref: J Am Coll Radiol. 2015 Feb;12(2): 143-50). 4. Aortic atherosclerosis. Aortic Atherosclerosis (ICD10-I70.0). Electronically Signed   By: Donzetta Kohut M.D.   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 OSA on CPAP Will get patient replacement parts for mask or new mask.  Continue to use cpap as directed.   2. Benign essential hypertension Stable, continue current management.   3. Cardiomyopathy, unspecified type [facility]) Managed by cardiology, continue to follow.  4. BMI 39.0-39.9,adult Obesity Counseling: Risk Assessment: An assessment of behavioral risk factors was made today and includes lack of exercise sedentary lifestyle, lack of portion control and poor dietary habits.  Risk Modification Advice: She was counseled on portion control guidelines. Restricting daily caloric intake to 1800. The detrimental long term effects of obesity on her health and ongoing poor compliance was also discussed with the patient.    General Counseling: I have discussed the findings of the evaluation and examination with Shelby [name].  I have also discussed any further diagnostic evaluation thatmay be needed or ordered today. Shelby [name] verbalizes understanding of the findings of todays visit. We also reviewed her medications today and discussed drug interactions and side effects including but not limited excessive drowsiness and altered mental states. We also discussed that there is always a risk not just to her but also people around her. she has been encouraged to call the office with any questions or concerns that should arise related to todays visit.  No orders of the defined types were placed in this encounter.    Time spent: 30  I have personally obtained a history, examined the patient, evaluated laboratory and imaging results,  formulated the assessment and plan and placed orders.    Shelby Pax, MD [facility] Pulmonary and Critical Care Sleep medicine

## 2020-05-17 NOTE — Progress Notes (Addendum)
05/18/20 3:56 PM   Shelby [name] 1940-12-21 [id number]  Referring provider: Mickey Farber, MD [address] [facility] [place],  [place] [address] Chief Complaint  Patient presents with  . Follow-up    follow-up    HPI: Shelby [name] is a 79 y.o. F who returns today for follow-up MRI of her renal mass.  She was initially seen and evaluated for right ureteral calculus which she subsequently passed. On this imaging, she did have multiple indeterminate renal lesions. She underwent further evaluation in the form of MRI of the abdomen with and without contrast for further characterization.  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 No lymphadenopathy or vein involvement.  Repeat MRI in 04/2019 shows essentially stable 10 mm enhancing renal lesion in the interpolar right kidney. She has Bosniak 1 and 2 renal cyst in addition to this.  She also has a stable 1.5 cm enhancing lesion at the dome of the bladder which is felt to be likely benign.  MRI Abdomen w wo contrast from 04/03/20 revealed stable small enhancing 9 mm lesion partially exophytic from the mid RIGHT kidney most consistent solid renal neoplasm. Multiple additional bilateral Bosniak 1 renal cysts also noted along w/ new nodules at the LEFT lung base. CT of Chest from 04/18/20 revealed shifting areas of nodularity in the LEFT lung base likely infectious or inflammatory. Resolution of the dominant area of concern in the LEFT lung base.  Overall, she is doing fair.  She has multiple medical comorbidities as listed below.  She reports of bothersome urge incontinence and urinary frequency.  This is increasing and becoming more bothersome.  She has to wear safety pads and diapers.  No flank pain or  gross hematuria.  No weight loss. No breathing problems.   PMH: Past Medical History:  Diagnosis Date  . Anemia   . Cardiomyopathy (HCC)    Ejection Fraction 30-35% per ECHO 2016  . CHF (congestive heart failure) (HCC)   . Diabetes (HCC)   . Dysrhythmia   . Hyperlipidemia   . Hypertension   . Peripheral neuropathy   . Peripheral neuropathy   . Sleep apnea     Surgical History: Past Surgical History:  Procedure Laterality Date  . ABDOMINAL HYSTERECTOMY    . COLONOSCOPY    . COLONOSCOPY N/A 04/01/2018   Procedure: COLONOSCOPY;  Surgeon: Christena Deem, MD;  Location: [facility] ENDOSCOPY;  Service: Endoscopy;  Laterality: N/A;  . COLONOSCOPY WITH PROPOFOL N/A 12/29/2017   Procedure: COLONOSCOPY WITH PROPOFOL;  Surgeon: Christena Deem, MD;  Location: [facility] ENDOSCOPY;  Service: Endoscopy;  Laterality: N/A;  . ESOPHAGOGASTRODUODENOSCOPY (EGD) WITH PROPOFOL N/A 12/29/2017   Procedure: ESOPHAGOGASTRODUODENOSCOPY (EGD) WITH PROPOFOL;  Surgeon: Christena Deem, MD;  Location: [facility] ENDOSCOPY;  Service: Endoscopy;  Laterality: N/A;  . ESOPHAGOGASTRODUODENOSCOPY (EGD) WITH PROPOFOL N/A 04/01/2018   Procedure: ESOPHAGOGASTRODUODENOSCOPY (EGD) WITH PROPOFOL;  Surgeon: Christena Deem, MD;  Location: [facility] ENDOSCOPY;  Service: Endoscopy;  Laterality: N/A;  . EUS N/A 05/20/2018   Procedure: ESOPHAGEAL ENDOSCOPIC ULTRASOUND (EUS) RADIAL;  Surgeon: Bearl Mulberry, MD;  Location: [facility] ENDOSCOPY;  Service: Gastroenterology;  Laterality: N/A;    Home Medications:  Allergies as of 05/18/2020      Reactions   Mirabegron Other (See Comments)   Elevated BP   Furosemide Other (  See Comments)   Legs swelling      Medication List       Accurate as of May 18, 2020  3:56 PM. If you have any questions, ask your nurse or doctor.        amLODipine 5 MG tablet Commonly known as: NORVASC Take 10 mg by mouth daily.   ascorbic acid 500 MG tablet Commonly known as: VITAMIN C Take 500 mg by  mouth daily.   aspirin EC 81 MG tablet Take 81 mg by mouth daily.   atorvastatin 80 MG tablet Commonly known as: LIPITOR Take 80 mg by mouth daily.   brimonidine 0.2 % ophthalmic solution Commonly known as: ALPHAGAN Place 1 drop into both eyes 2 (two) times daily.   candesartan 32 MG tablet Commonly known as: ATACAND Take by mouth daily.   carvedilol 25 MG tablet Commonly known as: COREG Take by mouth 2 (two) times daily with a meal.   donepezil 10 MG tablet Commonly known as: ARICEPT Take 10 mg by mouth at bedtime.   ferrous sulfate 325 (65 FE) MG tablet Take by mouth. Takes one pill every 2-3 days to minimize constipation   fesoterodine 8 MG Tb24 tablet Commonly known as: TOVIAZ Take 1 tablet (8 mg total) by mouth daily.   Fish Oil 1000 MG Caps Take 1,000 mg by mouth daily.   gabapentin 100 MG capsule Commonly known as: NEURONTIN Take 100 mg by mouth 3 (three) times daily.   magnesium oxide 400 MG tablet Commonly known as: MAG-OX Take 400 mg by mouth daily.   Multi-Vitamins Tabs Take 1 tablet by mouth daily.   travoprost (benzalkonium) 0.004 % ophthalmic solution Commonly known as: TRAVATAN Place 1 drop into both eyes at bedtime.       Allergies:  Allergies  Allergen Reactions  . Mirabegron Other (See Comments)    Elevated BP  . Furosemide Other (See Comments)    Legs swelling     Family History: Family History  Problem Relation Age of Onset  . Diabetes Mother   . Heart disease Mother   . Bladder Cancer Neg Hx   . Kidney cancer Neg Hx     Social History:  reports that she has never smoked. She has never used smokeless tobacco. She reports that she does not drink alcohol and does not use drugs.   Physical Exam: BP (!) 93/54   Pulse 69   Ht 5\' 4"  (1.626 m)   Wt 234 lb (106.1 kg)   BMI 40.17 kg/m   Constitutional:  Alert and oriented, No acute distress. HEENT: [place] AT, moist mucus membranes.  Trachea midline, no masses. Cardiovascular:  No clubbing, cyanosis, or edema. Respiratory: Normal respiratory effort, no increased work of breathing. Skin: No rashes, bruises or suspicious lesions. Neurologic: Grossly intact, no focal deficits, moving all 4 extremities. Psychiatric: Normal mood and affect.  Pertinent Imaging: CLINICAL DATA:  Follow-up RIGHT renal lesion.  EXAM: MRI ABDOMEN WITHOUT AND WITH CONTRAST  TECHNIQUE: Multiplanar multisequence MR imaging of the abdomen was performed both before and after the administration of intravenous contrast.  CONTRAST:  39mL GADAVIST GADOBUTROL 1 MMOL/ML IV SOLN  COMPARISON:  MRI, 05/06/2019, chest CT 01/15/2019  FINDINGS: Lower chest: Cluster of nodules at the LEFT lung base (image 9/5) not clearly seen on comparison CT.  Hepatobiliary: Enhancing lesion in the dome of the liver is poorly demarcated noncontrast exam (image 6/16) and measures approximately 16 mm x 13 mm. No change from comparison exam. TRUFISP imaging (  series 10) lesion is better delineated and measures 11 mm (unchanged).  Gallbladder normal.  Common bile duct normal  Pancreas: Cystic lesion in the head of the pancreas measuring 8 mm (image 24/5) and is increased from 6 mm. No clear communication with the pancreatic duct. The pancreatic duct is normal caliber. No pancreatic inflammation  Spleen: Normal spleen  Adrenals/urinary tract: Adrenal glands normal.  Partially exophytic from the mid RIGHT kidney is an 9 x 9 mm enhancing lesion (image 49/12) which is not changed from 9 mm x 9 mm on comparison MRI 05/06/2019.  Additional nonenhancing Bosniak I renal cysts in LEFT and RIGHT kidney also unchanged.  Stomach/Bowel: Stomach, small bowel, appendix, and cecum are normal. The colon and rectosigmoid colon are normal.  Vascular/Lymphatic: Abdominal aorta is normal caliber. No periportal or retroperitoneal adenopathy. No pelvic adenopathy.  Other: No free fluid.  Musculoskeletal:  No aggressive osseous lesion.  IMPRESSION: 1. Stable small enhancing 9 mm lesion partially exophytic from the mid RIGHT kidney most consistent solid renal neoplasm. 2. Multiple additional bilateral Bosniak 1 renal cysts 3. Mild enlargement of small 8 mm cystic lesion in the head of the pancreas. Recommend follow-up MRI 12 months per consensus criteria. This recommendation follows ACR consensus guidelines: Management of Incidental Pancreatic Cysts: A White Paper of the ACR Incidental Findings Committee. J Am Coll Radiol 4010;27:253-664. 4. New nodules at the LEFT lung base. Recommend dedicated CT of the thorax without contrast to compared to CT 01/15/2019. 5. Stable enhancing lesion in the in the dome liver. Stability favors benign lesion.  These results will be called to the ordering clinician or representative by the Radiologist Assistant, and communication documented in the PACS or [employer].   Electronically Signed   By: Suzy Bouchard M.D.   On: 04/03/2020 16:55  CLINICAL DATA:  Abnormal x-ray, follow-up nodule  EXAM: CT CHEST WITHOUT CONTRAST  TECHNIQUE: Multidetector CT imaging of the chest was performed following the standard protocol without IV contrast.  COMPARISON:  01/15/2019  FINDINGS: Cardiovascular: Calcified atheromatous plaque throughout the thoracic aorta. Moderate in terms of severity without aortic dilation. Limited assessment of vascular structures in the chest due to lack of intravenous contrast. Dilated main pulmonary artery to 3.2 cm. Slight increase in size of a small pericardial effusion. Heart size is stable with enlargement of LEFT atrium and LEFT ventricle likely.  Mediastinum/Nodes: Enlargement of RIGHT thyroid lobe with calcification. Heterogeneity measuring approximately 2.9 x 2.6 cm no visible adenopathy at the thoracic inlet on noncontrast imaging. No axillary lymphadenopathy. Esophagus is  unremarkable.  Lungs/Pleura: Basilar atelectasis. Subtle nodular densities at the LEFT lung base have developed since the prior study.  LEFT lower lobe nodule (image 98, series 3) 3 mm previously 6-7 mm greatest size. New areas of nodularity in the dependent LEFT chest with tree-in-bud opacities extending peripherally.  There was another nodule in the LEFT lung base on the prior study measuring as much as 8 mm. This has resolved.  Upper Abdomen: Hemorrhagic cyst in the upper pole the LEFT kidney previously characterized on MRI is slightly increased in size compared to the more remote CT studies unchanged compared to most recent MRI of April 03, 2020  Musculoskeletal: Spinal degenerative changes without acute or destructive bone process.  IMPRESSION: 1. Shifting areas of nodularity in the LEFT lung base likely infectious or inflammatory. Resolution of the dominant area of concern in the LEFT lung base. 2. Hemorrhagic cyst in the upper pole the LEFT kidney previously characterized on MRI  is slightly increased in size compared to the more remote CT studies. 3. Enlargement of the RIGHT thyroid lobe with calcification. Recommend thyroid ultrasound (ref: J Am Coll Radiol. 2015 Feb;12(2): 143-50). 4. Aortic atherosclerosis.  Aortic Atherosclerosis (ICD10-I70.0).   Electronically Signed   By: Donzetta Kohut M.D.   On: 04/18/2020 16:20  I have personally reviewed the images and agree with radiologist interpretation.   Assessment & Plan:    1. Renal mass  Solid renal mass stable since last year Given the stability of the lesion as well as her age and comorbidities, continue surveillance Return in 6 months w/ RUS prior in [place]  If able to see the stone on renal ultrasound, will likely transition to annual renal ultrasound (de-escalate imaging modality)  2. Lung nodule  CT scan consistent w/ inflammatory rather than malignancy  Advised to f/u w/ PCP if she  experiences any respiratory issues   3. Urinary urge incontinence/frequency Trial of Myrbetriq 25 mg samples x 1 month given to pt Her allergy state that she is a allergic to Myrbetriq and that her blood pressure rises, today her blood pressure is actually on the low side and the overall blood pressure rise from Myrbetriq is typically minimal thus I feel it is safe for her to try this again Advised to call and let us know if she would like a prescription   F/u 6 months with [facility] [address] [place], [place] [address] [phone]  I, Donne Hazel, am acting as a scribe for Dr. [name],  I have reviewed the above documentation for accuracy and completeness, and I agree with the above.   [name], MD

## 2020-05-18 ENCOUNTER — Other Ambulatory Visit: Payer: Self-pay

## 2020-05-18 ENCOUNTER — Ambulatory Visit (INDEPENDENT_AMBULATORY_CARE_PROVIDER_SITE_OTHER): Payer: Medicare Other | Admitting: Urology

## 2020-05-18 ENCOUNTER — Encounter: Payer: Self-pay | Admitting: Urology

## 2020-05-18 VITALS — BP 93/54 | HR 69 | Ht 64.0 in | Wt 234.0 lb

## 2020-05-18 DIAGNOSIS — N2889 Other specified disorders of kidney and ureter: Secondary | ICD-10-CM | POA: Diagnosis not present

## 2020-05-18 DIAGNOSIS — R911 Solitary pulmonary nodule: Secondary | ICD-10-CM

## 2020-05-18 DIAGNOSIS — R3915 Urgency of urination: Secondary | ICD-10-CM | POA: Diagnosis not present

## 2020-06-08 ENCOUNTER — Telehealth: Payer: Self-pay | Admitting: Urology

## 2020-06-08 NOTE — Telephone Encounter (Signed)
Pt called office and wants to know if she can get more samples of Myrbetriq 25 mg.  She said it worked really well.  She has enough left for another week.

## 2020-06-12 NOTE — Telephone Encounter (Signed)
Returned call unable to leave message.

## 2020-06-19 ENCOUNTER — Telehealth: Payer: Self-pay | Admitting: *Deleted

## 2020-06-19 MED ORDER — MIRABEGRON ER 25 MG PO TB24: 25.0000 mg | ORAL_TABLET | Freq: Every day | ORAL | 1 refills | Status: DC

## 2020-06-19 MED ORDER — MIRABEGRON ER 25 MG PO TB24: 25.0000 mg | ORAL_TABLET | Freq: Every day | ORAL | 0 refills | Status: DC

## 2020-06-19 NOTE — Telephone Encounter (Signed)
Patient walked in asking for samples or refills of Myrbetriq 25mg , per last note from Dr. ok to send in rx. Pt requested rx for local 30 day supply and mail order. rx sent.   3. Urinary urge incontinence/frequency Trial of Myrbetriq 25 mg samples x 1 month given to pt Her allergy state that she is a allergic to Myrbetriq and that her blood pressure rises, today her blood pressure is actually on the low side and the overall blood pressure rise from Myrbetriq is typically minimal thus I feel it is safe for her to try this again Advised to call and let Apolinar Junes know if she would like a prescription

## 2020-06-24 DIAGNOSIS — M5441 Lumbago with sciatica, right side: Secondary | ICD-10-CM | POA: Insufficient documentation

## 2020-11-06 ENCOUNTER — Ambulatory Visit: Payer: Medicare Other | Attending: Urology

## 2020-11-15 ENCOUNTER — Ambulatory Visit: Payer: Medicare Other | Admitting: Internal Medicine

## 2020-11-16 ENCOUNTER — Other Ambulatory Visit: Payer: Self-pay

## 2020-11-16 ENCOUNTER — Ambulatory Visit: Payer: Medicare Other | Admitting: Urology

## 2020-11-16 MED ORDER — MIRABEGRON ER 25 MG PO TB24: 25.0000 mg | ORAL_TABLET | Freq: Every day | ORAL | 1 refills | Status: DC

## 2020-11-19 ENCOUNTER — Telehealth: Payer: Self-pay

## 2020-11-19 NOTE — Telephone Encounter (Signed)
Rescheduled missed ov on 11-15-20. clt

## 2020-11-26 ENCOUNTER — Other Ambulatory Visit: Payer: Self-pay

## 2020-11-26 ENCOUNTER — Ambulatory Visit
Admission: RE | Admit: 2020-11-26 | Discharge: 2020-11-26 | Disposition: A | Discharge status: Home / Self Care | Payer: Medicare Other | Source: Ambulatory Visit | Attending: Urology | Admitting: Urology

## 2020-11-26 DIAGNOSIS — N2889 Other specified disorders of kidney and ureter: Secondary | ICD-10-CM

## 2020-12-04 ENCOUNTER — Ambulatory Visit: Payer: Medicare Other | Admitting: Internal Medicine

## 2020-12-06 ENCOUNTER — Ambulatory Visit (INDEPENDENT_AMBULATORY_CARE_PROVIDER_SITE_OTHER): Payer: Medicare Other | Admitting: Internal Medicine

## 2020-12-06 ENCOUNTER — Encounter: Payer: Self-pay | Admitting: Internal Medicine

## 2020-12-06 VITALS — BP 128/92 | HR 75 | Temp 97.2°F | Resp 16 | Ht 64.0 in | Wt 222.8 lb

## 2020-12-06 DIAGNOSIS — G4733 Obstructive sleep apnea (adult) (pediatric): Secondary | ICD-10-CM | POA: Diagnosis not present

## 2020-12-06 DIAGNOSIS — Z9989 Dependence on other enabling machines and devices: Secondary | ICD-10-CM

## 2020-12-06 DIAGNOSIS — I5022 Chronic systolic (congestive) heart failure: Secondary | ICD-10-CM

## 2020-12-06 DIAGNOSIS — Z7189 Other specified counseling: Secondary | ICD-10-CM

## 2020-12-06 NOTE — Progress Notes (Signed)
[facility] [address] [place], [place] [address]  Pulmonary Sleep Medicine   Office Visit Note  Patient Name: Shelby [name] DOB: 08-Jul-1941 MRN [id number]  Date of Service: 12/06/2020  Complaints/HPI:  Patient is here for routine pulmonary follow-up Has not worn her CPAP in over a month as she thinks she has pulled her tubing off and cannot get it back together The air is blowing out of the machine and making a lot of noise, due to noise she has not turned it back on to wear--has not had anyone look at machine since noise started CHF-Followed closely by cardiology   ROS  General: (-) fever, (-) chills, (-) night sweats, (-) weakness Skin: (-) rashes, (-) itching,. Eyes: (-) visual changes, (-) redness, (-) itching. Nose and Sinuses: (-) nasal stuffiness or itchiness, (-) postnasal drip, (-) nosebleeds, (-) sinus trouble. Mouth and Throat: (-) sore throat, (-) hoarseness. Neck: (-) swollen glands, (-) enlarged thyroid, (-) neck pain. Respiratory: - cough, (-) bloody sputum, - shortness of breath, - wheezing. Cardiovascular: - ankle swelling, (-) chest pain. Lymphatic: (-) lymph node enlargement. Neurologic: (-) numbness, (-) tingling. Psychiatric: (-) anxiety, (-) depression   Current Medication: Outpatient Encounter Medications as of 12/06/2020  Medication Sig  . amLODipine (NORVASC) 5 MG tablet Take 10 mg by mouth daily.   Marland Kitchen ascorbic acid (VITAMIN C) 500 MG tablet Take 500 mg by mouth daily.  Marland Kitchen aspirin EC 81 MG tablet Take 81 mg by mouth daily.  Marland Kitchen atorvastatin (LIPITOR) 80 MG tablet Take 80 mg by mouth daily.   . brimonidine (ALPHAGAN) 0.2 % ophthalmic solution Place 1 drop into both eyes 2 (two) times daily.   . candesartan (ATACAND) 32 MG tablet Take by mouth daily.   . carvedilol (COREG) 25 MG tablet Take by mouth 2 (two) times daily with a meal.   . donepezil (ARICEPT) 10 MG tablet Take 10 mg by mouth at bedtime.  . ferrous sulfate 325 (65  FE) MG tablet Take by mouth. Takes one pill every 2-3 days to minimize constipation  . fesoterodine (TOVIAZ) 8 MG TB24 tablet Take 1 tablet (8 mg total) by mouth daily.  Marland Kitchen gabapentin (NEURONTIN) 100 MG capsule Take 100 mg by mouth 3 (three) times daily.  . magnesium oxide (MAG-OX) 400 MG tablet Take 400 mg by mouth daily.   . mirabegron ER (MYRBETRIQ) 25 MG TB24 tablet Take 1 tablet (25 mg total) by mouth daily.  . Multiple Vitamin (MULTI-VITAMINS) TABS Take 1 tablet by mouth daily.   . Omega-3 Fatty Acids (FISH OIL) 1000 MG CAPS Take 1,000 mg by mouth daily.  . travoprost, benzalkonium, (TRAVATAN) 0.004 % ophthalmic solution Place 1 drop into both eyes at bedtime.    No facility-administered encounter medications on file as of 12/06/2020.    Surgical History: Past Surgical History:  Procedure Laterality Date  . ABDOMINAL HYSTERECTOMY    . COLONOSCOPY    . COLONOSCOPY N/A 04/01/2018   Procedure: COLONOSCOPY;  Surgeon: Christena Deem, MD;  Location: [facility] ENDOSCOPY;  Service: Endoscopy;  Laterality: N/A;  . COLONOSCOPY WITH PROPOFOL N/A 12/29/2017   Procedure: COLONOSCOPY WITH PROPOFOL;  Surgeon: Christena Deem, MD;  Location: [facility] ENDOSCOPY;  Service: Endoscopy;  Laterality: N/A;  . ESOPHAGOGASTRODUODENOSCOPY (EGD) WITH PROPOFOL N/A 12/29/2017   Procedure: ESOPHAGOGASTRODUODENOSCOPY (EGD) WITH PROPOFOL;  Surgeon: Christena Deem, MD;  Location: [facility] ENDOSCOPY;  Service: Endoscopy;  Laterality: N/A;  . ESOPHAGOGASTRODUODENOSCOPY (EGD) WITH PROPOFOL N/A 04/01/2018   Procedure: ESOPHAGOGASTRODUODENOSCOPY (  EGD) WITH PROPOFOL;  Surgeon: Christena Deem, MD;  Location: [facility] ENDOSCOPY;  Service: Endoscopy;  Laterality: N/A;  . EUS N/A 05/20/2018   Procedure: ESOPHAGEAL ENDOSCOPIC ULTRASOUND (EUS) RADIAL;  Surgeon: Bearl Mulberry, MD;  Location: [facility] ENDOSCOPY;  Service: Gastroenterology;  Laterality: N/A;    Medical History: Past Medical History:  Diagnosis Date  . Anemia   .  Cardiomyopathy (HCC)    Ejection Fraction 30-35% per ECHO 2016  . CHF (congestive heart failure) (HCC)   . Diabetes (HCC)   . Dysrhythmia   . Hyperlipidemia   . Hypertension   . Peripheral neuropathy   . Peripheral neuropathy   . Sleep apnea     Family History: Family History  Problem Relation Age of Onset  . Diabetes Mother   . Heart disease Mother   . Bladder Cancer Neg Hx   . Kidney cancer Neg Hx     Social History: Social History   Socioeconomic History  . Marital status: Widowed    Spouse name: Not on file  . Number of children: Not on file  . Years of education: Not on file  . Highest education level: Not on file  Occupational History  . Not on file  Tobacco Use  . Smoking status: Never Smoker  . Smokeless tobacco: Never Used  Vaping Use  . Vaping Use: Never used  Substance and Sexual Activity  . Alcohol use: No  . Drug use: No  . Sexual activity: Yes    Birth control/protection: Post-menopausal  Other Topics Concern  . Not on file  Social History Narrative  . Not on file   Social Determinants of Health   Financial Resource Strain: Not on file  Food Insecurity: Not on file  Transportation Needs: Not on file  Physical Activity: Not on file  Stress: Not on file  Social Connections: Not on file  Intimate Partner Violence: Not on file    Vital Signs: Blood pressure (!) 128/92, pulse 75, temperature (!) 97.2 F (36.2 C), resp. rate 16, height 5\' 4"  (1.626 m), weight 222 lb 12.8 oz (101.1 kg), SpO2 96 %.  Examination: General Appearance: The patient is well-developed, well-nourished, and in no distress. Skin: Gross inspection of skin unremarkable. Head: normocephalic, no gross deformities. Eyes: no gross deformities noted. ENT: ears appear grossly normal no exudates. Neck: Supple. No thyromegaly. No LAD. Respiratory: Clear throughout, no rhonchi, wheezing or rales noted. Cardiovascular: Normal S1 and S2 without murmur or rub. Extremities: No  cyanosis. pulses are equal. Neurologic: Alert and oriented. No involuntary movements.  LABS: No results found for this or any previous visit (from the past 2160 hour(s)).  Radiology: 2161 RENAL  Result Date: 11/27/2020 CLINICAL DATA:  Renal mass. EXAM: RENAL / URINARY TRACT ULTRASOUND COMPLETE COMPARISON:  CT chest 04/18/2020. MRI abdomen 04/03/2020. Renal ultrasound 02/25/2018. FINDINGS: Right Kidney: Renal measurements: 10.2 x 4.3 x 4.5 cm = volume: 102.5 mL. Mild increased echogenicity. 1.2 x 1.0 x 1.2 cm exophytic possibly solid mass noted in the right kidney. This may represent previously identified enhancing mass on prior MRI of 04/03/2020. Slight enlargement cannot be excluded. Repeat renal MRI for further evaluation/comparison suggested. Tiny right renal simple cysts again noted. Left Kidney: Renal measurements: 10.0 x 4.7 x 5.5 cm = volume: 135.8 mL. Mild increased echogenicity. Mild left hydronephrosis cannot be excluded. Multiple left renal simple cyst again noted, the largest measures 2.9 x 2.6 x 3.1 cm Bladder: Appears normal for degree of bladder distention. Other: None. IMPRESSION: 1.  1.2 x 1.0 x 1.2 cm exophytic possibly solid mass noted in the right kidney. This may represent previously identified enhancing mass on prior MRI of 04/03/2020. Slight enlargement cannot be excluded. Repeat renal MRI for further evaluation/comparison suggested. 2. Mild increased echogenicity both kidneys suggesting chronic medical renal disease. 3. Multiple bilateral renal simple cysts again noted. 4. Mild left hydronephrosis cannot be excluded. Electronically Signed   By: Maisie Fus  Register   On: 11/27/2020 08:13    No results found.  US RENAL  Result Date: 11/27/2020 CLINICAL DATA:  Renal mass. EXAM: RENAL / URINARY TRACT ULTRASOUND COMPLETE COMPARISON:  CT chest 04/18/2020. MRI abdomen 04/03/2020. Renal ultrasound 02/25/2018. FINDINGS: Right Kidney: Renal measurements: 10.2 x 4.3 x 4.5 cm = volume: 102.5  mL. Mild increased echogenicity. 1.2 x 1.0 x 1.2 cm exophytic possibly solid mass noted in the right kidney. This may represent previously identified enhancing mass on prior MRI of 04/03/2020. Slight enlargement cannot be excluded. Repeat renal MRI for further evaluation/comparison suggested. Tiny right renal simple cysts again noted. Left Kidney: Renal measurements: 10.0 x 4.7 x 5.5 cm = volume: 135.8 mL. Mild increased echogenicity. Mild left hydronephrosis cannot be excluded. Multiple left renal simple cyst again noted, the largest measures 2.9 x 2.6 x 3.1 cm Bladder: Appears normal for degree of bladder distention. Other: None. IMPRESSION: 1. 1.2 x 1.0 x 1.2 cm exophytic possibly solid mass noted in the right kidney. This may represent previously identified enhancing mass on prior MRI of 04/03/2020. Slight enlargement cannot be excluded. Repeat renal MRI for further evaluation/comparison suggested. 2. Mild increased echogenicity both kidneys suggesting chronic medical renal disease. 3. Multiple bilateral renal simple cysts again noted. 4. Mild left hydronephrosis cannot be excluded. Electronically Signed   By: Maisie Fus  Register   On: 11/27/2020 08:13      Assessment and Plan: Patient Active Problem List   Diagnosis Date Noted  . Lymphedema 09/03/2018  . Chronic systolic heart failure (HCC) 03/08/2018  . Encounter for dental exam and cleaning w/o abnormal findings 01/14/2018  . High risk medication use 12/11/2015  . Left lumbar radiculopathy 12/05/2014  . Anemia 06/12/2014  . Cardiomyopathy (HCC) 06/12/2014  . Benign essential hypertension 06/12/2014  . Hyperlipidemia 06/12/2014  . Obstructive sleep apnea 06/12/2014  . Peripheral neuropathy 06/12/2014  . Proteinuria 06/12/2014  . 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HCC) 06/12/2014  . 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 Vitamin B 12 deficiency 06/12/2014  . Vitamin D deficiency  06/12/2014    1. OSA on CPAP Advised to bring machine into office one day next week so I can take a look and reattach missing parts. Discussed importance of CPAP therapy and to notify office right away if something like this happens again.  2. CPAP use counseling Discussed importance of adequate CPAP use as well as proper care and cleaning techniques of machine and all supplies.  3. Chronic systolic heart failure (HCC) Symptoms appear stable today, followed closely by cardiology  General Counseling: I have discussed the findings of the evaluation and examination with Santina Evans.  I have also discussed any further diagnostic evaluation thatmay be needed or ordered today. Tayleigh verbalizes understanding of the findings of todays visit. We also reviewed her medications today and discussed drug interactions and side effects including but not limited excessive drowsiness and altered mental states. We also discussed that there is always a risk not just to her but also people around her. she has been  encouraged to call the office with any questions or concerns that should arise related to todays visit.    Time spent: 20  I have personally obtained a history, examined the patient, evaluated laboratory and imaging results, formulated the assessment and plan and placed orders. This patient was seen by Brent Generalaylor Foster AGNP-C in Collaboration with Dr. Freda MunroSaadat Khan as a part of collaborative care agreement.    Yevonne PaxSaadat A Khan, MD [facility] Pulmonary and Critical Care Sleep medicine

## 2020-12-10 ENCOUNTER — Encounter: Payer: Self-pay | Admitting: Internal Medicine

## 2020-12-10 NOTE — Patient Instructions (Signed)

## 2020-12-12 ENCOUNTER — Ambulatory Visit (INDEPENDENT_AMBULATORY_CARE_PROVIDER_SITE_OTHER): Payer: Medicare Other

## 2020-12-12 DIAGNOSIS — G4733 Obstructive sleep apnea (adult) (pediatric): Secondary | ICD-10-CM

## 2020-12-12 NOTE — Progress Notes (Signed)
She has not wore cpap in 90 days, reinstructed her on how to wear mask, she will come back in 3 months for download and will call me with problems before.

## 2020-12-28 ENCOUNTER — Other Ambulatory Visit: Payer: Self-pay

## 2020-12-28 ENCOUNTER — Ambulatory Visit: Payer: Medicare Other | Admitting: Urology

## 2020-12-28 ENCOUNTER — Telehealth (INDEPENDENT_AMBULATORY_CARE_PROVIDER_SITE_OTHER): Admitting: Urology

## 2020-12-28 DIAGNOSIS — R3915 Urgency of urination: Secondary | ICD-10-CM

## 2020-12-28 DIAGNOSIS — K862 Cyst of pancreas: Secondary | ICD-10-CM

## 2020-12-28 DIAGNOSIS — E041 Nontoxic single thyroid nodule: Secondary | ICD-10-CM

## 2020-12-28 DIAGNOSIS — N2889 Other specified disorders of kidney and ureter: Secondary | ICD-10-CM | POA: Diagnosis not present

## 2020-12-28 MED ORDER — MIRABEGRON ER 50 MG PO TB24: 50.0000 mg | ORAL_TABLET | Freq: Every day | ORAL | 3 refills | Status: DC

## 2020-12-28 NOTE — Progress Notes (Signed)
This service is provided via telemedicine   No vital signs collected/recorded due to the encounter was a telemedicine visit.     Patient consents to a telephone visit:  Yes    Names of all persons participating in the telemedicine service and their role in the encounter:  Nerissa Constantin O`Sullivan, RMA   

## 2020-12-28 NOTE — Progress Notes (Signed)
Virtual Visit via Telephone Note  I connected with Shelby [name] on 12/28/20 at  8:30 AM EST by telephone and verified that I am speaking with the correct person using two identifiers.  Location: Patient: home Provider: office   I discussed the limitations, risks, security and privacy concerns of performing an evaluation and management service by telephone and the availability of in person appointments. I also discussed with the patient that there may be a patient responsible charge related to this service. The patient expressed understanding and agreed to proceed.   History of Present Illness: 80 year old female with incidental renal mass on surveillance as well as OAB who presents today via virtual visit.  She does not have access to a smart phone.  She was unable to come in today due to winter weather.  She follows up today with a renal ultrasound.  This was performed on 11/27/2020.  This indicates that 1.2 x 1.0 x 1.2 exophytic solid renal mass in the right kidney.  The may have been an interval slight enlargement since 03/2020 MRI.  She has a personal history of urinary urgency frequency.  At last visit, she was given samples of Myrbetriq 25 mg which she found to be helpful.  The prescription was sent into her pharmacy ultimately.  She also has multiple medical comorbidities along with numerous incidental findings on imaging.  This includes a thyroid nodule for which she previously underwent thyroid ultrasound in 2019 as well as lung findings and a pancreatic cyst.  She also reports today that she would like to have Myrbetriq 25 reordered.  Taking this medication has helped her significantly with her urgency frequency.  She has not had issues with her blood pressure.  She is pleased with her voiding symptoms.  She has a follow-up next month with Dr. Harrington Challenger.  She has multiple medical comorbidities.   Observations/Objective: Pleasant  Assessment and Plan:  1. 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 Urinary urgency Improved with Myrbetriq 25 mg, 90-day prescription sent to pharmacy with multiple refills  3. Thyroid nodule Incidental finding on chest CT, status post thyroid ultrasound in 2019  Given this is outside the scope of urology, will defer follow-up of this to Dr. Audelia Acton.  She was advised to bring this to his attention at her appointment in February  4. Pancreatic cyst Same as above, will defer to primary care physician  Per radiology recommendations, she is due for repeat MRI in 03/2021 for slight interval growth of a pancreatic cyst    Follow Up Instructions: 6 mo with RUS   I discussed the assessment and treatment plan with the patient. The patient was provided an opportunity to ask questions and all were answered. The patient agreed with the plan and demonstrated an understanding of the instructions.   The patient was advised to call back or seek an in-person evaluation if the symptoms worsen or if the condition fails to improve as anticipated.   I provided 15 minutes of non-face-to-face time during this encounter.   [name], MD  \

## 2021-02-14 ENCOUNTER — Other Ambulatory Visit: Payer: Self-pay | Admitting: Internal Medicine

## 2021-02-14 DIAGNOSIS — K862 Cyst of pancreas: Secondary | ICD-10-CM

## 2021-02-27 ENCOUNTER — Ambulatory Visit

## 2021-03-04 ENCOUNTER — Emergency Department
Admission: EM | Admit: 2021-03-04 | Discharge: 2021-03-04 | Disposition: A | Discharge status: Home / Self Care | Payer: Medicare Other | Attending: Emergency Medicine | Admitting: Emergency Medicine

## 2021-03-04 ENCOUNTER — Emergency Department: Payer: Medicare Other

## 2021-03-04 ENCOUNTER — Other Ambulatory Visit: Payer: Self-pay

## 2021-03-04 DIAGNOSIS — I110 Hypertensive heart disease with heart failure: Secondary | ICD-10-CM | POA: Insufficient documentation

## 2021-03-04 DIAGNOSIS — I5022 Chronic systolic (congestive) heart failure: Secondary | ICD-10-CM | POA: Insufficient documentation

## 2021-03-04 DIAGNOSIS — Z7982 Long term (current) use of aspirin: Secondary | ICD-10-CM | POA: Insufficient documentation

## 2021-03-04 DIAGNOSIS — R42 Dizziness and giddiness: Secondary | ICD-10-CM | POA: Insufficient documentation

## 2021-03-04 DIAGNOSIS — N3000 Acute cystitis without hematuria: Secondary | ICD-10-CM | POA: Diagnosis not present

## 2021-03-04 DIAGNOSIS — Z79899 Other long term (current) drug therapy: Secondary | ICD-10-CM | POA: Diagnosis not present

## 2021-03-04 DIAGNOSIS — E119 Type 2 diabetes mellitus without complications: Secondary | ICD-10-CM | POA: Diagnosis not present

## 2021-03-04 LAB — CBG MONITORING, ED: Glucose-Capillary: 82 mg/dL (ref 70–99)

## 2021-03-04 LAB — BASIC METABOLIC PANEL
Anion gap: 6 (ref 5–15)
BUN: 29 mg/dL — ABNORMAL HIGH (ref 8–23)
CO2: 27 mmol/L (ref 22–32)
Calcium: 9.5 mg/dL (ref 8.9–10.3)
Chloride: 109 mmol/L (ref 98–111)
Creatinine, Ser: 1.58 mg/dL — ABNORMAL HIGH (ref 0.44–1.00)
GFR, Estimated: 34 mL/min — ABNORMAL LOW (ref >60)
Glucose, Bld: 109 mg/dL — ABNORMAL HIGH (ref 70–99)
Potassium: 4.2 mmol/L (ref 3.5–5.1)
Sodium: 142 mmol/L (ref 135–145)

## 2021-03-04 LAB — CBC
HCT: 38.2 % (ref 36.0–46.0)
Hemoglobin: 11.9 g/dL — ABNORMAL LOW (ref 12.0–15.0)
MCH: 29.5 pg (ref 26.0–34.0)
MCHC: 31.2 g/dL (ref 30.0–36.0)
MCV: 94.6 fL (ref 80.0–100.0)
Platelets: 186 10*3/uL (ref 150–400)
RBC: 4.04 MIL/uL (ref 3.87–5.11)
RDW: 15.5 % (ref 11.5–15.5)
WBC: 6.9 10*3/uL (ref 4.0–10.5)
nRBC: 0 % (ref 0.0–0.2)

## 2021-03-04 LAB — URINALYSIS, COMPLETE (UACMP) WITH MICROSCOPIC
Bilirubin Urine: NEGATIVE
Glucose, UA: NEGATIVE mg/dL
Hgb urine dipstick: NEGATIVE
Ketones, ur: NEGATIVE mg/dL
Nitrite: POSITIVE — AB
Protein, ur: NEGATIVE mg/dL
Specific Gravity, Urine: 1.024 (ref 1.005–1.030)
pH: 5 (ref 5.0–8.0)

## 2021-03-04 MED ORDER — CEFDINIR 300 MG PO CAPS: 300.0000 mg | ORAL_CAPSULE | Freq: Two times a day (BID) | ORAL | 0 refills | Status: AC

## 2021-03-04 MED ORDER — CEFDINIR 300 MG PO CAPS
300.0000 mg | ORAL_CAPSULE | Freq: Once | ORAL | Status: AC
  Administered 2021-03-04: 300 mg via ORAL
  Filled 2021-03-04: qty 1

## 2021-03-04 NOTE — ED Notes (Signed)
See triage note  Presents with some dizziness this am  Was [facility] with h/a   No fever

## 2021-03-04 NOTE — ED Provider Notes (Signed)
[facility] Emergency Department Provider Note   ____________________________________________   Event Date/Time   First MD Initiated Contact with Patient 03/04/21 1330     (approximate)  I have reviewed the triage vital signs and the nursing notes.   HISTORY  Chief Complaint Dizziness    HPI Shelby [name] is a 80 y.o. female with a stated past medical history of CHF, diabetes, hypertension, and peripheral neuropathy who presents for headache and balance issues over the past 3 days have been worsening since onset.  Patient denies any exacerbating or relieving factors for this feeling of lightheadedness.  Patient does endorse associated urinary frequency.  Patient states that she does not know if she is taking any medications for diuresis.  Patient currently denies any vision changes, tinnitus, difficulty speaking, facial droop, sore throat, chest pain, shortness of breath, abdominal pain, nausea/vomiting/diarrhea, or weakness/numbness/paresthesias in any extremity         Past Medical History:  Diagnosis Date  . Anemia   . Cardiomyopathy (HCC)    Ejection Fraction 30-35% per ECHO 2016  . CHF (congestive heart failure) (HCC)   . Diabetes (HCC)   . Dysrhythmia   . Hyperlipidemia   . Hypertension   . Peripheral neuropathy   . Peripheral neuropathy   . Sleep apnea     Patient Active Problem List   Diagnosis Date Noted  . Lymphedema 09/03/2018  . Chronic systolic heart failure (HCC) 03/08/2018  . Encounter for dental exam and cleaning w/o abnormal findings 01/14/2018  . High risk medication use 12/11/2015  . Left lumbar radiculopathy 12/05/2014  . Anemia 06/12/2014  . Cardiomyopathy (HCC) 06/12/2014  . Benign essential hypertension 06/12/2014  . Hyperlipidemia 06/12/2014  . Obstructive sleep apnea 06/12/2014  . Peripheral neuropathy 06/12/2014  . Proteinuria 06/12/2014  . Type 2 diabetes mellitus with other diabetic kidney  complication (HCC) 06/12/2014  . 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 Vitamin B 12 deficiency 06/12/2014  . Vitamin D deficiency 06/12/2014    Past Surgical History:  Procedure Laterality Date  . ABDOMINAL HYSTERECTOMY    . COLONOSCOPY    . COLONOSCOPY N/A 04/01/2018   Procedure: COLONOSCOPY;  Surgeon: Christena Deem, MD;  Location: [facility] ENDOSCOPY;  Service: Endoscopy;  Laterality: N/A;  . COLONOSCOPY WITH PROPOFOL N/A 12/29/2017   Procedure: COLONOSCOPY WITH PROPOFOL;  Surgeon: Christena Deem, MD;  Location: [facility] ENDOSCOPY;  Service: Endoscopy;  Laterality: N/A;  . ESOPHAGOGASTRODUODENOSCOPY (EGD) WITH PROPOFOL N/A 12/29/2017   Procedure: ESOPHAGOGASTRODUODENOSCOPY (EGD) WITH PROPOFOL;  Surgeon: Christena Deem, MD;  Location: [facility] ENDOSCOPY;  Service: Endoscopy;  Laterality: N/A;  . ESOPHAGOGASTRODUODENOSCOPY (EGD) WITH PROPOFOL N/A 04/01/2018   Procedure: ESOPHAGOGASTRODUODENOSCOPY (EGD) WITH PROPOFOL;  Surgeon: Christena Deem, MD;  Location: [facility] ENDOSCOPY;  Service: Endoscopy;  Laterality: N/A;  . EUS N/A 05/20/2018   Procedure: ESOPHAGEAL ENDOSCOPIC ULTRASOUND (EUS) RADIAL;  Surgeon: Bearl Mulberry, MD;  Location: [facility] ENDOSCOPY;  Service: Gastroenterology;  Laterality: N/A;    Prior to Admission medications   Medication Sig Start Date End Date Taking? Authorizing Provider  cefdinir (OMNICEF) 300 MG capsule Take 1 capsule (300 mg total) by mouth 2 (two) times daily for 5 days. 03/04/21 03/09/21 Yes Merwyn Katos, MD  amLODipine (NORVASC) 5 MG tablet Take 10 mg by mouth daily.  12/11/15   [provider]  ascorbic acid (VITAMIN C) 500 MG tablet Take 500 mg by mouth daily.    [provider]  aspirin  EC 81 MG tablet Take 81 mg by mouth daily.    [provider]  atorvastatin (LIPITOR) 80 MG tablet Take 80 mg by mouth daily.  12/11/15   [provider]  brimonidine (ALPHAGAN) 0.2 % ophthalmic solution Place  1 drop into both eyes 2 (two) times daily.     [provider]  candesartan (ATACAND) 32 MG tablet Take by mouth daily.  07/15/16   [provider]  carvedilol (COREG) 25 MG tablet Take by mouth 2 (two) times daily with a meal.  07/15/16   [provider]  donepezil (ARICEPT) 10 MG tablet Take 10 mg by mouth at bedtime.    [provider]  ferrous sulfate 325 (65 FE) MG tablet Take by mouth. Takes one pill every 2-3 days to minimize constipation    [provider]  fesoterodine (TOVIAZ) 8 MG TB24 tablet Take 1 tablet (8 mg total) by mouth daily. 03/11/18   Michiel Cowboy A, PA-C  magnesium oxide (MAG-OX) 400 MG tablet Take 400 mg by mouth daily.     [provider]  mirabegron ER (MYRBETRIQ) 50 MG TB24 tablet Take 1 tablet (50 mg total) by mouth daily. 12/28/20   [name], MD  Multiple Vitamin (MULTI-VITAMINS) TABS Take 1 tablet by mouth daily.     [provider]  Omega-3 Fatty Acids (FISH OIL) 1000 MG CAPS Take 1,000 mg by mouth daily.    [provider]  travoprost, benzalkonium, (TRAVATAN) 0.004 % ophthalmic solution Place 1 drop into both eyes at bedtime.     [provider]    Allergies Furosemide  Family History  Problem Relation Age of Onset  . Diabetes Mother   . Heart disease Mother   . Bladder Cancer Neg Hx   . Kidney cancer Neg Hx     Social History Social History   Tobacco Use  . Smoking status: Never Smoker  . Smokeless tobacco: Never Used  Vaping Use  . Vaping Use: Never used  Substance Use Topics  . Alcohol use: No  . Drug use: No    Review of Systems Constitutional: No fever/chills Eyes: No visual changes. ENT: No sore throat. Cardiovascular: Denies chest pain. Respiratory: Denies shortness of breath. Gastrointestinal: No abdominal pain.  No nausea, no vomiting.  No diarrhea. Genitourinary: positive for dysuria. Musculoskeletal: Negative for acute arthralgias Skin:  Negative for rash. Neurological: Negative for headaches, weakness/numbness/paresthesias in any extremity Psychiatric: Negative for suicidal ideation/homicidal ideation   ____________________________________________   PHYSICAL EXAM:  VITAL SIGNS: ED Triage Vitals  Enc Vitals Group     BP 03/04/21 1246 105/66     Pulse Rate 03/04/21 1246 68     Resp 03/04/21 1246 18     Temp 03/04/21 1246 98.1 F (36.7 C)     Temp Source 03/04/21 1246 Oral     SpO2 03/04/21 1246 97 %     Weight 03/04/21 1244 226 lb (102.5 kg)     Height 03/04/21 1244 5\' 4"  (1.626 m)     Head Circumference --      Peak Flow --      Pain Score 03/04/21 1243 6     Pain Loc --      Pain Edu? --      Excl. in GC? --    Constitutional: Alert and oriented. Well appearing and in no acute distress. Eyes: Conjunctivae are normal. PERRL. Head: Atraumatic. Nose: No congestion/rhinnorhea. Mouth/Throat: Mucous membranes are moist. Neck: No stridor Cardiovascular: Grossly  normal heart sounds.  Good peripheral circulation. Respiratory: Normal respiratory effort.  No retractions. Gastrointestinal: Soft and nontender. No distention. Musculoskeletal: No obvious deformities Neurologic:  Normal speech and language. No gross focal neurologic deficits are appreciated. Skin:  Skin is warm and dry. No rash noted. Psychiatric: Mood and affect are normal. Speech and behavior are normal.  ____________________________________________   LABS (all labs ordered are listed, but only abnormal results are displayed)  Labs Reviewed  BASIC METABOLIC PANEL - Abnormal; Notable for the following components:      Result Value   Glucose, Bld 109 (*)    BUN 29 (*)    Creatinine, Ser 1.58 (*)    GFR, Estimated 34 (*)    All other components within normal limits  CBC - Abnormal; Notable for the following components:   Hemoglobin 11.9 (*)    All other components within normal limits  URINALYSIS, COMPLETE (UACMP) WITH MICROSCOPIC -  Abnormal; Notable for the following components:   Color, Urine YELLOW (*)    APPearance CLEAR (*)    Nitrite POSITIVE (*)    Leukocytes,Ua SMALL (*)    Bacteria, UA MANY (*)    All other components within normal limits  CBG MONITORING, ED   ____________________________________________  EKG  ED ECG REPORT I, Merwyn Katos, the attending physician, personally viewed and interpreted this ECG.  Date: 03/04/2021 EKG Time: 1253 Rate: 64 Rhythm: normal sinus rhythm QRS Axis: normal Intervals: normal ST/T Wave abnormalities: normal Narrative Interpretation: no evidence of acute ischemia  ____________________________________________  RADIOLOGY  ED MD interpretation: CT of the head without contrast shows no evidence of acute abnormalities including no intracranial hemorrhage, calvarial fractures, significant edema, or obvious masses  Official radiology report(s): CT Head Wo Contrast  Result Date: 03/04/2021 CLINICAL DATA:  Headache and dizziness. EXAM: CT HEAD WITHOUT CONTRAST TECHNIQUE: Contiguous axial images were obtained from the base of the skull through the vertex without intravenous contrast. COMPARISON:  None. FINDINGS: Brain: No evidence of acute infarction, hemorrhage, hydrocephalus, extra-axial collection or mass lesion/mass effect. Scattered mild periventricular and subcortical white matter hypodensities are nonspecific, but favored to reflect chronic microvascular ischemic changes. Vascular: Atherosclerotic vascular calcification of the carotid siphons. No hyperdense vessel. Skull: Normal. Negative for fracture or focal lesion. Sinuses/Orbits: No acute finding. Partial opacification of the left mastoid air cells. Other: None. IMPRESSION: 1. No acute intracranial abnormality. 2. Mild chronic microvascular ischemic changes. Electronically Signed   By: Obie Dredge M.D.   On: 03/04/2021 14:09    ____________________________________________   PROCEDURES  Procedure(s)  performed (including Critical Care):  .1-3 Lead EKG Interpretation Performed by: Merwyn Katos, MD Authorized by: Merwyn Katos, MD     Interpretation: normal     ECG rate:  67   ECG rate assessment: normal     Rhythm: sinus rhythm     Ectopy: none     Conduction: normal       ____________________________________________   INITIAL IMPRESSION / ASSESSMENT AND PLAN / ED COURSE  As part of my medical decision making, I reviewed the following data within the electronic MEDICAL RECORD NUMBER Nursing notes reviewed and incorporated, Labs reviewed, EKG interpreted, Old chart reviewed, Radiograph reviewed and Notes from prior ED visits reviewed and incorporated      Not Pregnant. Unlikely TOA, Ovarian Torsion, PID, gonorrhea/chlamydia. Low suspicion for Infected Urolithiasis, AAA, Cholecystitis, Pancreatitis, SBO, Appendicitis, or other acute abdomen.  Rx: Cefdinir 300 mg BID for 5 days Disposition: Discharge home. SRP discussed. Advise  follow up with primary care provider within 24-72 hours.      ____________________________________________   FINAL CLINICAL IMPRESSION(S) / ED DIAGNOSES  Final diagnoses:  Dizziness  Acute cystitis without hematuria     ED Discharge Orders         Ordered    cefdinir (OMNICEF) 300 MG capsule  2 times daily        03/04/21 1502           Note:  This document was prepared using Dragon voice recognition software and may include unintentional dictation errors.   Merwyn KatosBradler, Cheri Ayotte K, MD 03/04/21 [phone]

## 2021-03-04 NOTE — ED Triage Notes (Signed)
Pt sent over from [facility] with c/o HA with dizziness , feeling off balance for the past 3 days. Pt is a/ox4 on arrival with no acute distress noted.

## 2021-03-13 ENCOUNTER — Ambulatory Visit

## 2021-03-13 ENCOUNTER — Other Ambulatory Visit: Payer: Self-pay

## 2021-03-13 ENCOUNTER — Other Ambulatory Visit: Payer: Self-pay | Admitting: Hospice and Palliative Medicine

## 2021-03-13 ENCOUNTER — Ambulatory Visit (INDEPENDENT_AMBULATORY_CARE_PROVIDER_SITE_OTHER): Payer: Medicare Other

## 2021-03-13 DIAGNOSIS — G4733 Obstructive sleep apnea (adult) (pediatric): Secondary | ICD-10-CM | POA: Diagnosis not present

## 2021-03-13 NOTE — Progress Notes (Signed)
She has not worn cpap since last Sept She wore it 7 days in Sept 2021   95 percentile pressure 12    95th percentile leak 8.9   apnea index 31.1 /hr  apnea-hypopnea index  33.2 /hr   total days used  >4 hr 7 days  total days used <4 hr 6 days  Had her put mask on because she stated it leaked she did not have mask strapped to fit on her. Tightened to sit against her but not tight she will try to see if this helps

## 2021-03-20 ENCOUNTER — Ambulatory Visit: Admitting: Hospice and Palliative Medicine

## 2021-04-02 ENCOUNTER — Ambulatory Visit
Admission: RE | Admit: 2021-04-02 | Discharge: 2021-04-02 | Disposition: A | Discharge status: Home / Self Care | Payer: Medicare Other | Source: Ambulatory Visit | Attending: Internal Medicine | Admitting: Internal Medicine

## 2021-04-02 ENCOUNTER — Other Ambulatory Visit: Payer: Self-pay

## 2021-04-02 DIAGNOSIS — K862 Cyst of pancreas: Secondary | ICD-10-CM | POA: Insufficient documentation

## 2021-04-02 MED ORDER — GADOBUTROL 1 MMOL/ML IV SOLN
10.0000 mL | Freq: Once | INTRAVENOUS | Status: AC | PRN
  Administered 2021-04-02: 10 mL via INTRAVENOUS

## 2021-04-24 ENCOUNTER — Ambulatory Visit: Payer: Medicare Other

## 2021-06-26 ENCOUNTER — Other Ambulatory Visit: Payer: Self-pay

## 2021-06-26 ENCOUNTER — Ambulatory Visit
Admission: RE | Admit: 2021-06-26 | Discharge: 2021-06-26 | Disposition: A | Discharge status: Home / Self Care | Payer: Medicare (Managed Care) | Source: Ambulatory Visit | Attending: Urology | Admitting: Urology

## 2021-06-26 DIAGNOSIS — N281 Cyst of kidney, acquired: Secondary | ICD-10-CM | POA: Diagnosis not present

## 2021-06-26 DIAGNOSIS — R3915 Urgency of urination: Secondary | ICD-10-CM | POA: Diagnosis not present

## 2021-06-28 ENCOUNTER — Ambulatory Visit: Payer: Self-pay | Admitting: Urology

## 2021-06-28 ENCOUNTER — Telehealth: Payer: Self-pay | Admitting: Urology

## 2021-06-28 DIAGNOSIS — G2581 Restless legs syndrome: Secondary | ICD-10-CM | POA: Insufficient documentation

## 2021-06-28 NOTE — Telephone Encounter (Signed)
This patient no showed for follow-up renal mass.  I did review the MRI she had in April as well as her renal ultrasound.  Overall, the mass appears stable in size and we recommend continued surveillance  Lets go ahead and have her follow-up in 1 year with a MRI of the abdomen with and without contrast prior.  She has any questions or concerns, feel free to reschedule her appointment.  [name], MD

## 2021-06-28 NOTE — Progress Notes (Deleted)
06/28/2021 1:16 PM   Shelby [name] [birth year] [id number]  Referring provider: Mickey Farber, MD [address] [facility] [place],  [place] [address]  No chief complaint on file.   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     PMH: Past Medical History:  Diagnosis Date   Anemia    Cardiomyopathy (HCC)    Ejection Fraction 30-35% per ECHO 2016   CHF (congestive heart failure) (HCC)    Diabetes (HCC)    Dysrhythmia    Hyperlipidemia    Hypertension    Peripheral neuropathy    Peripheral neuropathy    Sleep apnea     Surgical History: Past Surgical History:  Procedure Laterality Date   ABDOMINAL HYSTERECTOMY     COLONOSCOPY     COLONOSCOPY N/A 04/01/2018   Procedure: COLONOSCOPY;  Surgeon: Christena Deem, MD;  Location: [facility] ENDOSCOPY;  Service: Endoscopy;  Laterality: N/A;   COLONOSCOPY WITH PROPOFOL N/A 12/29/2017   Procedure: COLONOSCOPY WITH PROPOFOL;  Surgeon: Christena Deem, MD;  Location: [facility] ENDOSCOPY;  Service: Endoscopy;  Laterality: N/A;   ESOPHAGOGASTRODUODENOSCOPY (EGD) WITH PROPOFOL N/A 12/29/2017   Procedure: ESOPHAGOGASTRODUODENOSCOPY (EGD) WITH PROPOFOL;  Surgeon: Christena Deem, MD;  Location: [facility] ENDOSCOPY;  Service: Endoscopy;  Laterality: N/A;   ESOPHAGOGASTRODUODENOSCOPY (EGD) WITH PROPOFOL N/A 04/01/2018   Procedure: ESOPHAGOGASTRODUODENOSCOPY (EGD) WITH PROPOFOL;  Surgeon: Christena Deem, MD;  Location: [facility] ENDOSCOPY;  Service: Endoscopy;  Laterality: N/A;   EUS N/A 05/20/2018   Procedure: ESOPHAGEAL ENDOSCOPIC ULTRASOUND (EUS) RADIAL;  Surgeon: Bearl Mulberry, MD;  Location: [facility] ENDOSCOPY;  Service: Gastroenterology;  Laterality: N/A;    Home Medications:  Allergies as of 06/28/2021       Reactions   Furosemide Other (See Comments)   Legs swelling        Medication List        Accurate as of June 28, 2021  1:16 PM.  If you have any questions, ask your nurse or doctor.          amLODipine 5 MG tablet Commonly known as: NORVASC Take 10 mg by mouth daily.   ascorbic acid 500 MG tablet Commonly known as: VITAMIN C Take 500 mg by mouth daily.   aspirin EC 81 MG tablet Take 81 mg by mouth daily.   atorvastatin 80 MG tablet Commonly known as: LIPITOR Take 80 mg by mouth daily.   brimonidine 0.2 % ophthalmic solution Commonly known as: ALPHAGAN Place 1 drop into both eyes 2 (two) times daily.   candesartan 32 MG tablet Commonly known as: ATACAND Take by mouth daily.   carvedilol 25 MG tablet Commonly known as: COREG Take by mouth 2 (two) times daily with a meal.   donepezil 10 MG tablet Commonly known as: ARICEPT Take 10 mg by mouth at bedtime.   ferrous sulfate 325 (65 FE) MG tablet Take by mouth. Takes one pill every 2-3 days to minimize constipation   fesoterodine 8 MG Tb24 tablet Commonly known as: TOVIAZ Take 1 tablet (8 mg total) by mouth daily.   Fish Oil 1000 MG Caps Take 1,000 mg by mouth daily.   magnesium oxide 400 MG tablet Commonly known as: MAG-OX Take 400 mg by mouth daily.   mirabegron ER 50 MG Tb24 tablet Commonly known as: MYRBETRIQ Take 1 tablet (50 mg total) by mouth daily.   Multi-Vitamins Tabs Take 1 tablet by mouth daily.  travoprost (benzalkonium) 0.004 % ophthalmic solution Commonly known as: TRAVATAN Place 1 drop into both eyes at bedtime.        Allergies:  Allergies  Allergen Reactions   Furosemide Other (See Comments)    Legs swelling     Family History: Family History  Problem Relation Age of Onset   Diabetes Mother    Heart disease Mother    Bladder Cancer Neg Hx    Kidney cancer Neg Hx     Social History:  reports that she has never smoked. She has never used smokeless tobacco. She reports that she does not drink alcohol and does not use drugs.   Physical Exam: There were no vitals taken for this visit.   Constitutional:  Alert and oriented, No acute distress. HEENT: [place] AT, moist mucus membranes.  Trachea midline, no masses. Cardiovascular: No clubbing, cyanosis, or edema. Respiratory: Normal respiratory effort, no increased work of breathing. GI: Abdomen is soft, nontender, nondistended, no abdominal masses GU: No CVA tenderness Lymph: No cervical or inguinal lymphadenopathy. Skin: No rashes, bruises or suspicious lesions. Neurologic: Grossly intact, no focal deficits, moving all 4 extremities. Psychiatric: Normal mood and affect.  Laboratory Data: Lab Results  Component Value Date   WBC 6.9 03/04/2021   HGB 11.9 (L) 03/04/2021   HCT 38.2 03/04/2021   MCV 94.6 03/04/2021   PLT 186 03/04/2021    Lab Results  Component Value Date   CREATININE 1.58 (H) 03/04/2021    No results found for: PSA  No results found for: TESTOSTERONE  No results found for: HGBA1C  Urinalysis    Component Value Date/Time   COLORURINE YELLOW (A) 03/04/2021 1410   APPEARANCEUR CLEAR (A) 03/04/2021 1410   APPEARANCEUR Clear 02/16/2018 0910   LABSPEC 1.024 03/04/2021 1410   PHURINE 5.0 03/04/2021 1410   GLUCOSEU NEGATIVE 03/04/2021 1410   HGBUR NEGATIVE 03/04/2021 1410   BILIRUBINUR NEGATIVE 03/04/2021 1410   BILIRUBINUR Negative 02/16/2018 0910   KETONESUR NEGATIVE 03/04/2021 1410   PROTEINUR NEGATIVE 03/04/2021 1410   NITRITE POSITIVE (A) 03/04/2021 1410   LEUKOCYTESUR SMALL (A) 03/04/2021 1410    Lab Results  Component Value Date   LABMICR See below: 02/16/2018   WBCUA None seen 02/16/2018   RBCUA None seen 02/16/2018   LABEPIT 0-10 02/16/2018   BACTERIA MANY (A) 03/04/2021    Pertinent Imaging: *** No results found for this or any previous visit.  Results for orders placed during the hospital encounter of 11/19/18  US Venous Img Lower Bilateral  Narrative CLINICAL DATA:  Bilateral lower extremity pain and edema for the past 6 months. Evaluate for DVT.  EXAM: BILATERAL  LOWER EXTREMITY VENOUS DOPPLER ULTRASOUND  TECHNIQUE: Gray-scale sonography with graded compression, as well as color Doppler and duplex ultrasound were performed to evaluate the lower extremity deep venous systems from the level of the common femoral vein and including the common femoral, femoral, profunda femoral, popliteal and calf veins including the posterior tibial, peroneal and gastrocnemius veins when visible. The superficial great saphenous vein was also interrogated. Spectral Doppler was utilized to evaluate flow at rest and with distal augmentation maneuvers in the common femoral, femoral and popliteal veins.  COMPARISON:  None.  FINDINGS: RIGHT LOWER EXTREMITY  Common Femoral Vein: No evidence of thrombus. Normal compressibility, respiratory phasicity and response to augmentation.  Saphenofemoral Junction: No evidence of thrombus. Normal compressibility and flow on color Doppler imaging.  Profunda Femoral Vein: No evidence of thrombus. Normal compressibility and flow on color Doppler imaging.  Femoral Vein: No evidence of thrombus. Normal compressibility, respiratory phasicity and response to augmentation.  Popliteal Vein: No evidence of thrombus. Normal compressibility, respiratory phasicity and response to augmentation.  Calf Veins: No evidence of thrombus. Normal compressibility and flow on color Doppler imaging.  Superficial Great Saphenous Vein: No evidence of thrombus. Normal compressibility.  Venous Reflux:  None.  Other Findings:  None.  LEFT LOWER EXTREMITY  Common Femoral Vein: No evidence of thrombus. Normal compressibility, respiratory phasicity and response to augmentation.  Saphenofemoral Junction: No evidence of thrombus. Normal compressibility and flow on color Doppler imaging.  Profunda Femoral Vein: No evidence of thrombus. Normal compressibility and flow on color Doppler imaging.  Femoral Vein: No evidence of thrombus. Normal  compressibility, respiratory phasicity and response to augmentation.  Popliteal Vein: No evidence of thrombus. Normal compressibility, respiratory phasicity and response to augmentation.  Calf Veins: No evidence of thrombus. Normal compressibility and flow on color Doppler imaging.  Superficial Great Saphenous Vein: No evidence of thrombus. Normal compressibility.  Venous Reflux:  None.  Other Findings:  None.  IMPRESSION: No evidence of DVT within either lower extremity.   Electronically Signed By: Simonne Come M.D. On: 11/19/2018 10:16  No results found for this or any previous visit.  No results found for this or any previous visit.  Results for orders placed during the hospital encounter of 06/26/21  US RENAL  Narrative CLINICAL DATA:  Urinary urgency  EXAM: RENAL / URINARY TRACT ULTRASOUND COMPLETE  COMPARISON:  None.  FINDINGS: Right Kidney:  Renal measurements: 10.0 x 4.3 x 5.1 cm = volume: 115 mL. Echogenicity within normal limits. No mass or hydronephrosis visualized. Multiple simple cortical cysts are identified within the right kidney measuring up to 14 mm within the interpolar region.  Left Kidney:  Renal measurements: 10.1 x 5.2 x 4.4 cm = volume: 100 mL. Echogenicity within normal limits. No mass or hydronephrosis visualized. Multiple simple cortical and parapelvic cysts are identified measuring up to 32 mm within the upper pole.  Bladder:  The bladder is decompressed  Other:  None.  IMPRESSION: Multiple bilateral benign, simple cortical cysts. Otherwise unremarkable renal sonogram.   Electronically Signed By: Helyn Numbers MD On: 06/27/2021 01:22  No results found for this or any previous visit.  No results found for this or any previous visit.  Results for orders placed during the hospital encounter of 01/23/18  CT RENAL STONE STUDY  Narrative CLINICAL DATA:  Lower abdominal cramping and hematuria. No history of kidney  stones.  EXAM: CT ABDOMEN AND PELVIS WITHOUT CONTRAST  TECHNIQUE: Multidetector CT imaging of the abdomen and pelvis was performed following the standard protocol without IV contrast.  COMPARISON:  None.  FINDINGS: Lower chest: 6 mm pulmonary nodule in the left lower lobe (series 4, image 10). Mild left atrial enlargement with small pericardial effusion.  Hepatobiliary: No focal liver abnormality. Cholelithiasis. No gallbladder wall thickening or biliary dilatation.  Pancreas: Unremarkable. No pancreatic ductal dilatation or surrounding inflammatory changes.  Spleen: Normal in size without focal abnormality.  Adrenals/Urinary Tract: The adrenal glands are unremarkable. Two subcentimeter hyperdense lesions arising from the lower pole of the left kidney are too small to characterize. Punctate bilateral renal calculi. There is a 3 mm calculus in the distal left ureter with resultant mild left hydroureteronephrosis and mild left perinephric fat stranding.  Stomach/Bowel: Small hiatal hernia. The stomach is otherwise within normal limits. No evidence of bowel wall thickening, distention, or surrounding inflammatory changes. Mild left-sided colonic diverticulosis.  Appendix is not definitively visualized, however there are no secondary signs of inflammatory process in the right lower quadrant.  Vascular/Lymphatic: Aortic atherosclerosis. No enlarged abdominal or pelvic lymph nodes.  Reproductive: Status post hysterectomy. No adnexal masses.  Other: Small bilateral fat containing inguinal hernias. No free fluid or pneumoperitoneum.  Musculoskeletal: No acute or significant osseous findings.  IMPRESSION: 1. 3 mm calculus in the distal left ureter with resultant mild left hydroureteronephrosis. 2. Additional punctate nonobstructive bilateral nephrolithiasis. 3. Cholelithiasis. 4. 6 mm pulmonary nodule in the left lower lobe. Non-contrast chest CT at 6-12 months is  recommended. If the nodule is stable at time of repeat CT, then future CT at 18-24 months (from today's scan) is considered optional for low-risk patients, but is recommended for high-risk patients. This recommendation follows the consensus statement: Guidelines for Management of Incidental Pulmonary Nodules Detected on CT Images: From the Fleischner Society 2017; Radiology 2017; 284:228-243. 5.  Aortic atherosclerosis (ICD10-I70.0).   Electronically Signed By: Obie Dredge M.D. On: 01/23/2018 11:22   Assessment & Plan:    There are no diagnoses linked to this encounter.  No follow-ups on file.  [name], MD  [facility] [address] [place], [place] [address] [phone]

## 2021-07-09 ENCOUNTER — Other Ambulatory Visit: Payer: Self-pay

## 2021-07-09 DIAGNOSIS — N2889 Other specified disorders of kidney and ureter: Secondary | ICD-10-CM

## 2021-07-09 NOTE — Telephone Encounter (Signed)
Sw pt. She verbalized understanding. Appt made, mri ordered.

## 2021-11-14 ENCOUNTER — Ambulatory Visit
Admission: EM | Admit: 2021-11-14 | Discharge: 2021-11-14 | Disposition: A | Discharge status: Home / Self Care | Payer: Medicare Other | Attending: Internal Medicine | Admitting: Internal Medicine

## 2021-11-14 ENCOUNTER — Encounter: Payer: Self-pay | Admitting: Emergency Medicine

## 2021-11-14 ENCOUNTER — Other Ambulatory Visit: Payer: Self-pay

## 2021-11-14 DIAGNOSIS — M461 Sacroiliitis, not elsewhere classified: Secondary | ICD-10-CM | POA: Diagnosis not present

## 2021-11-14 DIAGNOSIS — J3089 Other allergic rhinitis: Secondary | ICD-10-CM

## 2021-11-14 MED ORDER — MELOXICAM 7.5 MG PO TABS: 7.5000 mg | ORAL_TABLET | Freq: Every day | ORAL | 0 refills | Status: DC

## 2021-11-14 MED ORDER — FLUTICASONE PROPIONATE 50 MCG/ACT NA SUSP: 1.0000 | Freq: Two times a day (BID) | NASAL | 0 refills | Status: DC

## 2021-11-14 NOTE — ED Triage Notes (Signed)
Pt presents today with c/o of runny nose/congestion (x1 week), bilateral hand weakness (dropping things) x 1 wk, bilateral hip pain x 10 days.

## 2021-11-14 NOTE — ED Provider Notes (Signed)
[facility]    CSN: [id number] Arrival date & time: 11/14/21  [id number]      History   Chief Complaint Chief Complaint  Patient presents with   Nasal Congestion   Extremity Weakness   Hip Pain    HPI Shelby [name] is a 80 y.o. female presents with multiple complaints 1- Onset  of rhinitis x 1 week. Denies fever. Has been taking Claritin but has not helped. Denies sneezing or having itchy.   2- Bilateral hand weakness and has been droping things x 1 week. Anything she holds provokes worse tremor and she ends up dropping things. Deenies paresthesia or numbness or pain of her hands. She has had a tremor for years, but has never dont this before. Denies repetitive hand use.   3- Bilateral hip low back pain x 10 days. Pain is located in her groin. Pain is aggravated with walking. Heat helps and has even fallen asleep with it and burned her back. She denies falling or walking more than usual before this started.    Has apt to see her new PCP 12/12  Past Medical History:  Diagnosis Date   Anemia    Cardiomyopathy ([place])    Ejection Fraction 30-35% per ECHO 2016   CHF (congestive heart failure) ([place])    Diabetes ([place])    Dysrhythmia    Hyperlipidemia    Hypertension    Peripheral neuropathy    Peripheral neuropathy    Sleep apnea     Patient Active Problem List   Diagnosis Date Noted   Lymphedema [id number]   Chronic systolic heart failure ([place]) 03/08/2018   Encounter for dental exam and cleaning w/o abnormal findings 01/14/2018   High risk medication use 12/11/2015   Left lumbar radiculopathy 12/05/2014   Anemia 06/12/2014   Cardiomyopathy ([place]) 06/12/2014   Benign essential hypertension 06/12/2014   Hyperlipidemia 06/12/2014   Obstructive sleep apnea 06/12/2014   Peripheral neuropathy 06/12/2014   Proteinuria 06/12/2014   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[place]) [id number]   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[place]) 06/12/2014   Vitamin B 12 deficiency 06/12/2014   Vitamin D deficiency 06/12/2014    Past Surgical History:  Procedure Laterality Date   ABDOMINAL HYSTERECTOMY     COLONOSCOPY     COLONOSCOPY N/A 04/01/2018   Procedure: COLONOSCOPY;  Surgeon: Lollie Sails, MD;  Location: [facility] ENDOSCOPY;  Service: Endoscopy;  Laterality: N/A;   COLONOSCOPY WITH PROPOFOL N/A 12/29/2017   Procedure: COLONOSCOPY WITH PROPOFOL;  Surgeon: Lollie Sails, MD;  Location: [facility] ENDOSCOPY;  Service: Endoscopy;  Laterality: N/A;   ESOPHAGOGASTRODUODENOSCOPY (EGD) WITH PROPOFOL N/A 12/29/2017   Procedure: ESOPHAGOGASTRODUODENOSCOPY (EGD) WITH PROPOFOL;  Surgeon: Lollie Sails, MD;  Location: [facility] ENDOSCOPY;  Service: Endoscopy;  Laterality: N/A;   ESOPHAGOGASTRODUODENOSCOPY (EGD) WITH PROPOFOL N/A 04/01/2018   Procedure: ESOPHAGOGASTRODUODENOSCOPY (EGD) WITH PROPOFOL;  Surgeon: Lollie Sails, MD;  Location: [facility] ENDOSCOPY;  Service: Endoscopy;  Laterality: N/A;   EUS N/A 05/20/2018   Procedure: ESOPHAGEAL ENDOSCOPIC ULTRASOUND (EUS) RADIAL;  Surgeon: Holly Bodily, MD;  Location: [facility] ENDOSCOPY;  Service: Gastroenterology;  Laterality: N/A;    OB History   No obstetric history on file.      Home Medications    Prior to Admission medications   Medication Sig Start Date End Date Taking? Authorizing Provider  amLODipine (NORVASC) 5 MG tablet Take 10 mg by mouth daily.  12/11/15   [provider]  ascorbic  acid (VITAMIN C) 500 MG tablet Take 500 mg by mouth daily.    [provider]  aspirin EC 81 MG tablet Take 81 mg by mouth daily.    [provider]  atorvastatin (LIPITOR) 80 MG tablet Take 80 mg by mouth daily.  12/11/15   [provider]  brimonidine (ALPHAGAN) 0.2 % ophthalmic solution Place 1 drop into both eyes 2 (two) times daily.     [provider]  candesartan (ATACAND) 32 MG tablet Take by mouth daily.  07/15/16   [provider]  carvedilol (COREG) 25 MG tablet Take by mouth 2 (two) times daily with a meal.  07/15/16   [provider]  donepezil (ARICEPT) 10 MG tablet Take 10 mg by mouth at bedtime.    [provider]  ferrous sulfate 325 (65 FE) MG tablet Take by mouth. Takes one pill every 2-3 days to minimize constipation    [provider]  fesoterodine (TOVIAZ) 8 MG TB24 tablet Take 1 tablet (8 mg total) by mouth daily. 03/11/18   Michiel Cowboy A, PA-C  magnesium oxide (MAG-OX) 400 MG tablet Take 400 mg by mouth daily.     [provider]  mirabegron ER (MYRBETRIQ) 50 MG TB24 tablet Take 1 tablet (50 mg total) by mouth daily. 12/28/20   [name], MD  Multiple Vitamin (MULTI-VITAMINS) TABS Take 1 tablet by mouth daily.     [provider]  Omega-3 Fatty Acids (FISH OIL) 1000 MG CAPS Take 1,000 mg by mouth daily.    [provider]  travoprost, benzalkonium, (TRAVATAN) 0.004 % ophthalmic solution Place 1 drop into both eyes at bedtime.     [provider]    Family History Family History  Problem Relation Age of Onset   Diabetes Mother    Heart disease Mother    Bladder Cancer Neg Hx    Kidney cancer Neg Hx     Social History Social History   Tobacco Use   Smoking status: Never   Smokeless tobacco: Never  Vaping Use   Vaping Use: Never used  Substance Use Topics   Alcohol use: No   Drug use: No     Allergies   Mirabegron and Furosemide   Review of Systems Review of Systems  Constitutional:  Negative for fever.  HENT:  Positive for postnasal drip and rhinorrhea. Negative for congestion, ear discharge, ear pain, sneezing and voice change.   Eyes:  Negative for discharge and itching.  Respiratory:  Negative for cough.   Gastrointestinal:  Negative for abdominal pain.  Musculoskeletal:  Positive for arthralgias and back pain.  Skin:  Negative for rash.  Neurological:  Positive for weakness. Negative for numbness.     Physical Exam Triage Vital Signs ED Triage Vitals  Enc Vitals Group     BP 11/14/21 0856 (!) 155/85     Pulse Rate 11/14/21 0856 65     Resp 11/14/21 0856 18     Temp 11/14/21 0856 98.2 F (36.8 C)     Temp Source 11/14/21 0856 Oral     SpO2 11/14/21 0856 95 %     Weight --      Height --      Head Circumference --      Peak Flow --      Pain Score 11/14/21 0853 6     Pain Loc --      Pain Edu? --      Excl. in GC? --  No data found.  Updated Vital Signs BP (!) 155/85 (BP Location: Left Arm)   Pulse 65   Temp 98.2 F (36.8 C) (Oral)   Resp 18   SpO2 95%   Visual Acuity Right Eye Distance:   Left Eye Distance:   Bilateral Distance:    Right Eye Near:   Left Eye Near:    Bilateral Near:     Physical Exam Constitutional:      General: She is not in acute distress.    Appearance: She is obese. She is not ill-appearing or diaphoretic.  HENT:     Head: Normocephalic.     Right Ear: Tympanic membrane, ear canal and external ear normal.     Left Ear: Tympanic membrane, ear canal and external ear normal.     Nose: Rhinorrhea present.     Comments: clear Eyes:     General: No scleral icterus.    Conjunctiva/sclera: Conjunctivae normal.  Pulmonary:     Effort: Pulmonary effort is normal.  Musculoskeletal:        General: Normal range of motion.     Cervical back: Neck supple.     Comments: HIPS- legs are equal lengths. No pain with ROM on hips or back BACK- has mild local tenderness on SI region R<L, lateral and anterior flexion provoked her pain. Neg SLR  Skin:    General: Skin is warm and dry.     Findings: No bruising, erythema or rash.  Neurological:     Mental Status: She is alert and oriented to person, place, and time.     Motor: No weakness.     Deep Tendon Reflexes: Reflexes normal.     Comments: Uses a cane to ambulate I had her extend her arms out and she did not have a tremor. Finger grip test for 10 seconds and strength was 5/5 bilaterally  and no tremor or weakness noted.   Psychiatric:        Mood and Affect: Mood normal.        Behavior: Behavior normal.        Thought Content: Thought content normal.        Judgment: Judgment normal.     UC Treatments / Results  Labs (all labs ordered are listed, but only abnormal results are displayed) Labs Reviewed - No data to display  EKG   Radiology No results found.  Procedures Procedures (including critical care time)  Medications Ordered in UC Medications - No data to display  Initial Impression / Assessment and Plan / UC Course  I have reviewed the triage vital signs and the nursing notes. I reviewed her old lumbar spine MRI from 2013 and shows facet arthropathy and disc disease.  Has allergic rhinitis Sacroiliitis Hand weakness and tremor not present today and has normal exam I placed her on Flonase and Mobic as noted. Needs to keep apt with PCP next week     Final Clinical Impressions(s) / UC Diagnoses   Final diagnoses:  None   Discharge Instructions   None    ED Prescriptions   None    PDMP not reviewed this encounter.   Shelby Mattocks, [place] 11/14/21 [phone]

## 2021-11-18 DIAGNOSIS — R251 Tremor, unspecified: Secondary | ICD-10-CM | POA: Insufficient documentation

## 2021-11-18 DIAGNOSIS — J3089 Other allergic rhinitis: Secondary | ICD-10-CM | POA: Insufficient documentation

## 2022-02-17 DIAGNOSIS — M8589 Other specified disorders of bone density and structure, multiple sites: Secondary | ICD-10-CM | POA: Insufficient documentation

## 2022-04-01 IMAGING — US US RENAL
1 series · 13 of 25 positions shown · non-contrast
Comparison: CT chest 04/18/2020. MRI abdomen 04/03/2020. Renal
ultrasound 02/25/2018.

CLINICAL DATA: Renal mass.

EXAM:
RENAL / URINARY TRACT ULTRASOUND COMPLETE

[Series 1: us renal · 0.23mm/px · 13 of 51 slices shown]
[im 1/51]
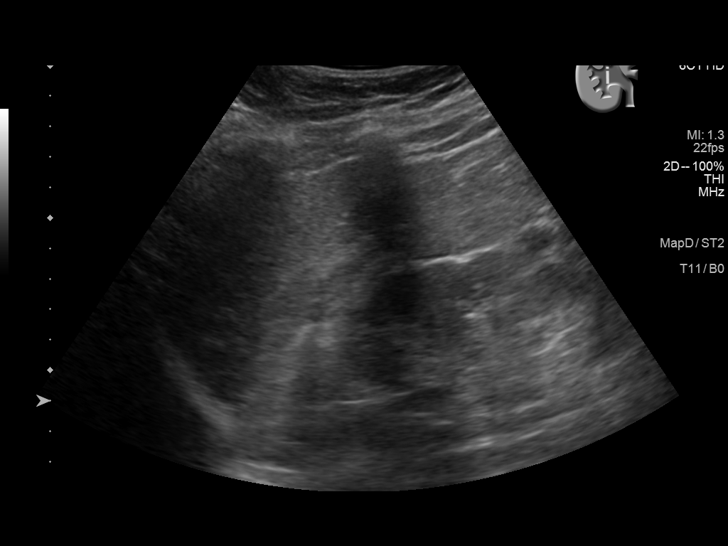
[im 5/51]
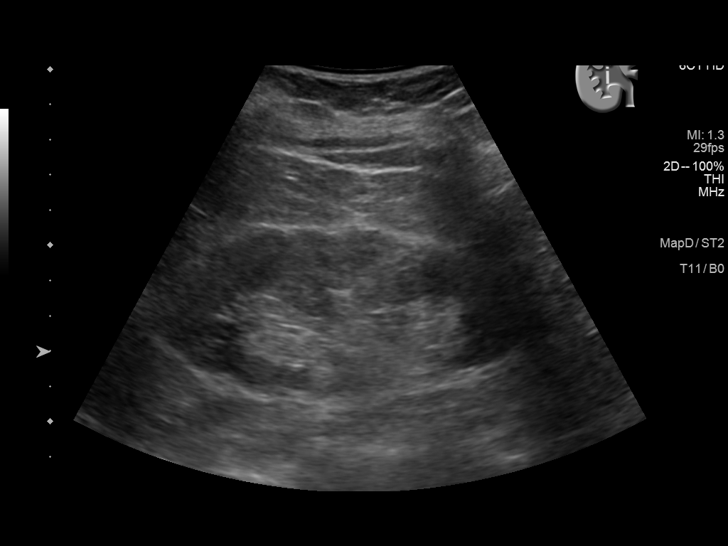
[im 9/51]
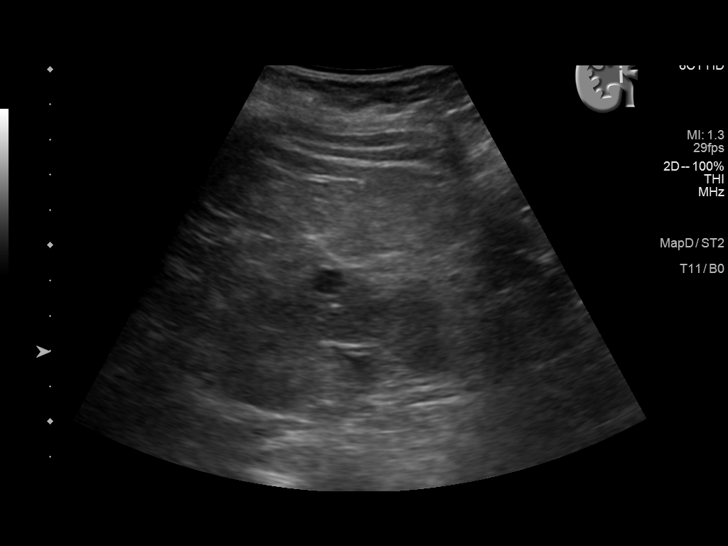
[im 13/51]
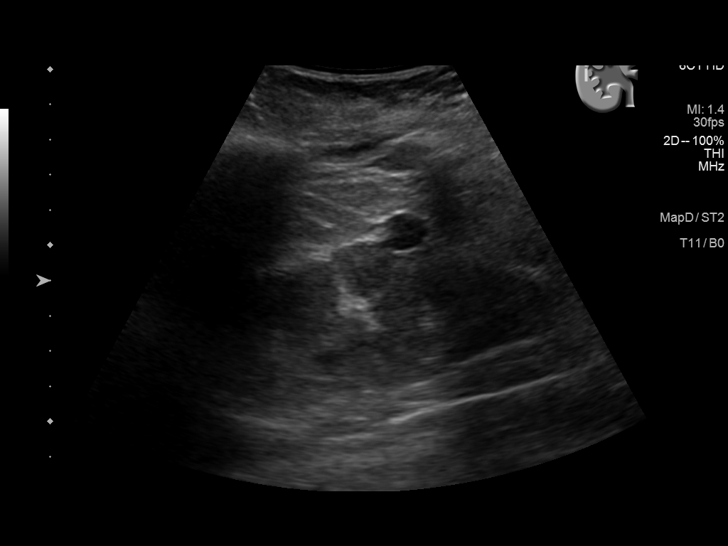
[im 17/51]
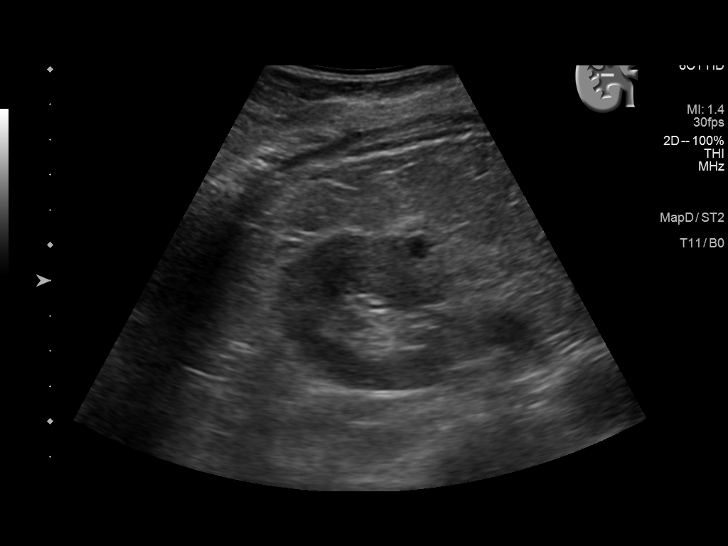
[im 21/51]
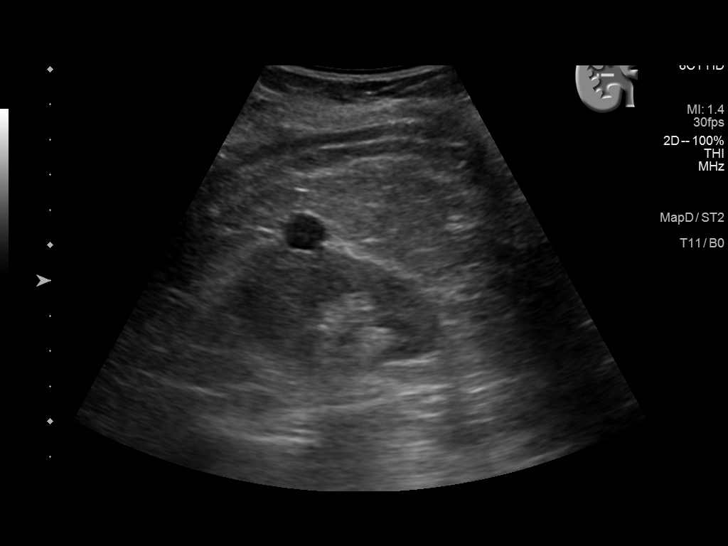
[im 26/51]
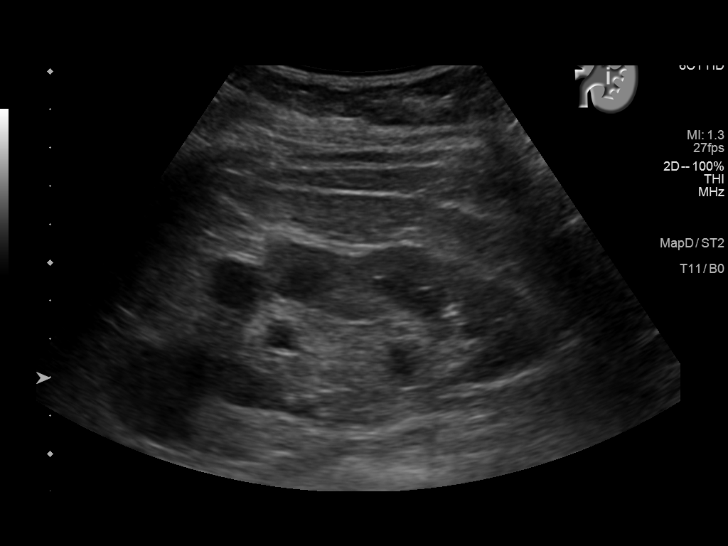
[im 30/51]
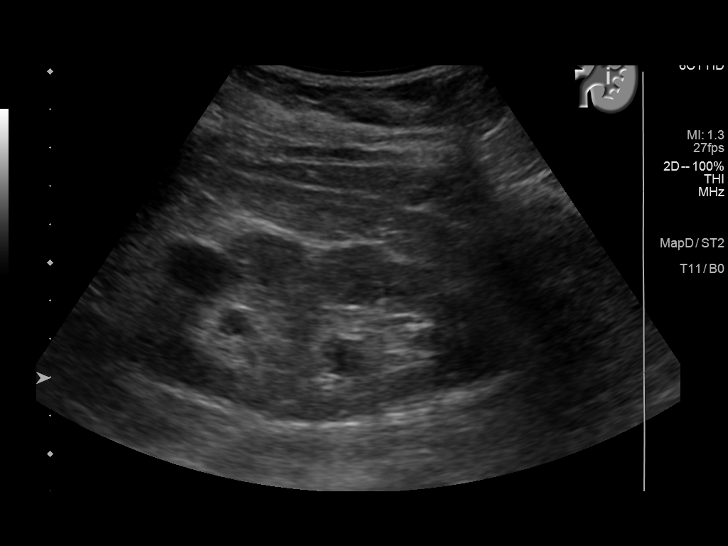
[im 34/51]
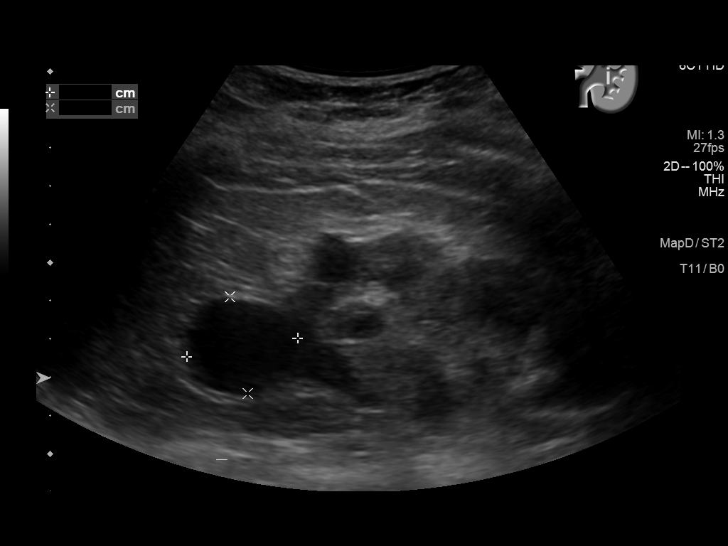
[im 38/51]
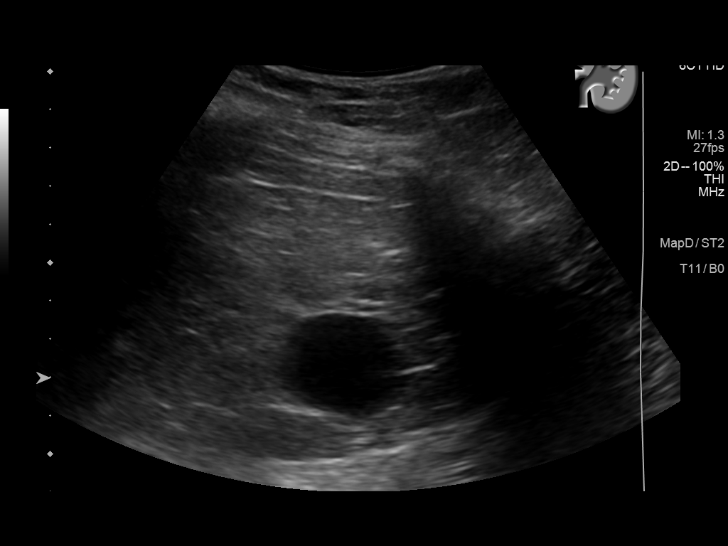
[im 42/51]
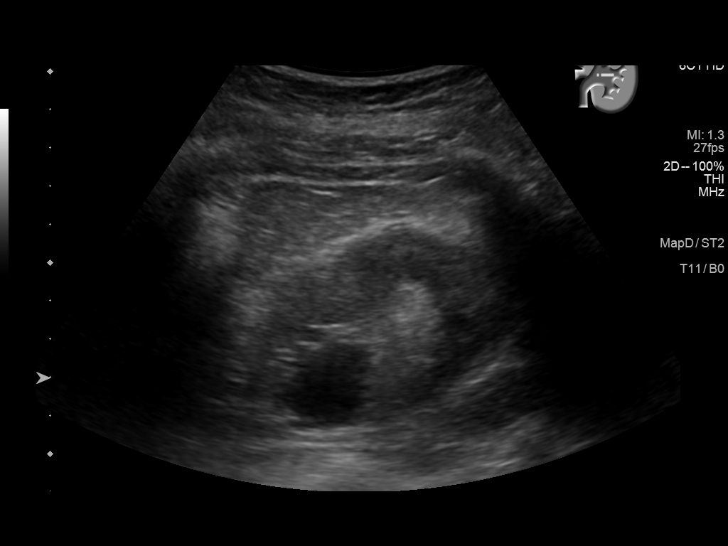
[im 46/51]
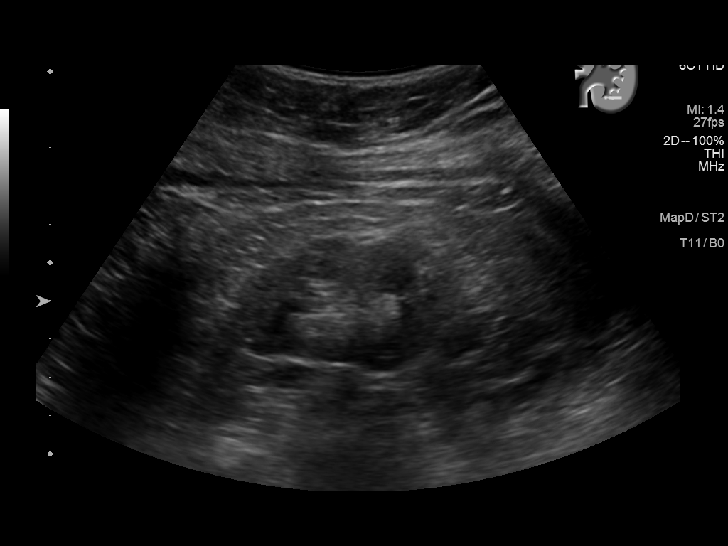
[im 51/51]
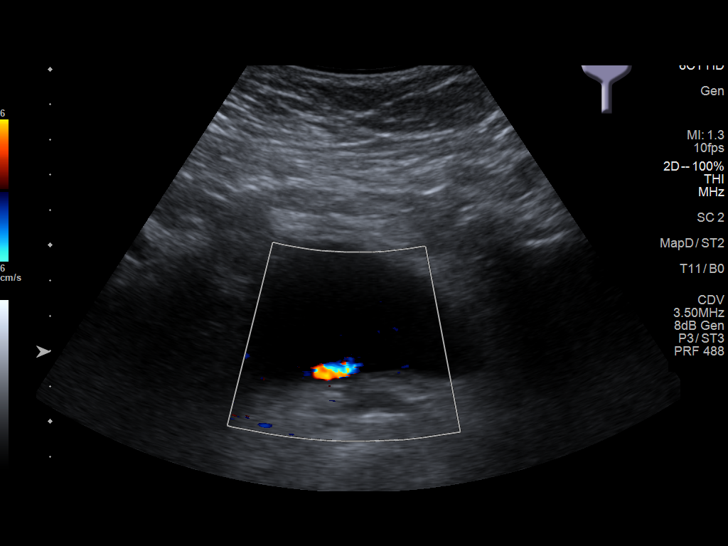

[13 of 25 positions shown; findings below may reference images not displayed]

FINDINGS: Right Kidney:

Renal measurements: 10.2 x 4.3 x 4.5 cm = volume: 102.5 mL. Mild
increased echogenicity. 1.2 x 1.0 x 1.2 cm exophytic possibly solid
mass noted in the right kidney. This may represent previously
identified enhancing mass on prior MRI of 04/03/2020. Slight
enlargement cannot be excluded. Repeat renal MRI for further
evaluation/comparison suggested. Tiny right renal simple cysts again
noted.

Left Kidney:

Renal measurements: 10.0 x 4.7 x 5.5 cm = volume: 135.8 mL. Mild
increased echogenicity. Mild left hydronephrosis cannot be excluded.
Multiple left renal simple cyst again noted, the largest measures
2.9 x 2.6 x 3.1 cm

Bladder:

Appears normal for degree of bladder distention.

Other:

None.
IMPRESSION: 1. 1.2 x 1.0 x 1.2 cm exophytic possibly solid mass noted in the
right kidney. This may represent previously identified enhancing
mass on prior MRI of 04/03/2020. Slight enlargement cannot be
excluded. Repeat renal MRI for further evaluation/comparison
suggested.
2. Mild increased echogenicity both kidneys suggesting chronic
medical renal disease.
3. Multiple bilateral renal simple cysts again noted.
4. Mild left hydronephrosis cannot be excluded.

## 2022-05-01 NOTE — Addendum Note (Signed)
Encounter addended by: Novella Olive on: 05/01/2022 9:57 AM  Actions taken: Letter saved

## 2022-05-22 DIAGNOSIS — L989 Disorder of the skin and subcutaneous tissue, unspecified: Secondary | ICD-10-CM | POA: Insufficient documentation

## 2022-05-22 DIAGNOSIS — K862 Cyst of pancreas: Secondary | ICD-10-CM | POA: Insufficient documentation

## 2022-05-22 DIAGNOSIS — H401124 Primary open-angle glaucoma, left eye, indeterminate stage: Secondary | ICD-10-CM | POA: Insufficient documentation

## 2022-07-11 ENCOUNTER — Ambulatory Visit: Payer: Self-pay | Admitting: Urology

## 2022-07-16 ENCOUNTER — Other Ambulatory Visit: Payer: Self-pay | Admitting: Gerontology

## 2022-07-16 DIAGNOSIS — N2889 Other specified disorders of kidney and ureter: Secondary | ICD-10-CM

## 2022-07-16 DIAGNOSIS — K862 Cyst of pancreas: Secondary | ICD-10-CM

## 2022-07-18 ENCOUNTER — Other Ambulatory Visit: Payer: Self-pay

## 2022-07-18 ENCOUNTER — Ambulatory Visit: Admitting: Urology

## 2022-07-18 DIAGNOSIS — N2889 Other specified disorders of kidney and ureter: Secondary | ICD-10-CM

## 2022-07-18 NOTE — Progress Notes (Incomplete)
07/18/22 6:24 AM   Shelby [name] March 26, 1941 [id number]  Referring provider:  Mickey Farber, MD [address] [place],  [place] [address] No chief complaint on file.      HPI: Shelby [name] is a 81 y.o.female with a personal history of incidental renal mass on surveillance as well as OAB, who presents today for a 1  year follow up with MRI.   She underwent a RUS on 11/27/2020 that visualized a 1.2 x 1.0 x 1.2 exophytic solid renal mass in the right kidney which had slight interval growth.  For her urinary urgency she is managed on myrbetriq, 25 mg.   She had multiple bilateral benign simple cortical cysts on RUS in 06/2021.       PMH: Past Medical History:  Diagnosis Date   Anemia    Cardiomyopathy (HCC)    Ejection Fraction 30-35% per ECHO 2016   CHF (congestive heart failure) (HCC)    Diabetes (HCC)    Dysrhythmia    Hyperlipidemia    Hypertension    Peripheral neuropathy    Peripheral neuropathy    Sleep apnea     Surgical History: Past Surgical History:  Procedure Laterality Date   ABDOMINAL HYSTERECTOMY     COLONOSCOPY     COLONOSCOPY N/A 04/01/2018   Procedure: COLONOSCOPY;  Surgeon: Christena Deem, MD;  Location: [facility] ENDOSCOPY;  Service: Endoscopy;  Laterality: N/A;   COLONOSCOPY WITH PROPOFOL N/A 12/29/2017   Procedure: COLONOSCOPY WITH PROPOFOL;  Surgeon: Christena Deem, MD;  Location: [facility] ENDOSCOPY;  Service: Endoscopy;  Laterality: N/A;   ESOPHAGOGASTRODUODENOSCOPY (EGD) WITH PROPOFOL N/A 12/29/2017   Procedure: ESOPHAGOGASTRODUODENOSCOPY (EGD) WITH PROPOFOL;  Surgeon: Christena Deem, MD;  Location: [facility] ENDOSCOPY;  Service: Endoscopy;  Laterality: N/A;   ESOPHAGOGASTRODUODENOSCOPY (EGD) WITH PROPOFOL N/A 04/01/2018   Procedure: ESOPHAGOGASTRODUODENOSCOPY (EGD) WITH PROPOFOL;  Surgeon: Christena Deem, MD;  Location: [facility] ENDOSCOPY;  Service: Endoscopy;  Laterality: N/A;   EUS N/A 05/20/2018   Procedure:  ESOPHAGEAL ENDOSCOPIC ULTRASOUND (EUS) RADIAL;  Surgeon: Bearl Mulberry, MD;  Location: [facility] ENDOSCOPY;  Service: Gastroenterology;  Laterality: N/A;    Home Medications:  Allergies as of 07/18/2022       Reactions   Mirabegron Other (See Comments)   Elevated BP   Furosemide Other (See Comments)   Legs swelling        Medication List        Accurate as of July 18, 2022  6:24 AM. If you have any questions, ask your nurse or doctor.          amLODipine 5 MG tablet Commonly known as: NORVASC Take 10 mg by mouth daily.   ascorbic acid 500 MG tablet Commonly known as: VITAMIN C Take 500 mg by mouth daily.   aspirin EC 81 MG tablet Take 81 mg by mouth daily.   atorvastatin 80 MG tablet Commonly known as: LIPITOR Take 80 mg by mouth daily.   brimonidine 0.2 % ophthalmic solution Commonly known as: ALPHAGAN Place 1 drop into both eyes 2 (two) times daily.   candesartan 32 MG tablet Commonly known as: ATACAND Take by mouth daily.   carvedilol 25 MG tablet Commonly known as: COREG Take by mouth 2 (two) times daily with a meal.   donepezil 10 MG tablet Commonly known as: ARICEPT Take 10 mg by mouth at bedtime.   ferrous sulfate 325 (65 FE) MG tablet Take by mouth. Takes one pill every 2-3 days to minimize  constipation   fesoterodine 8 MG Tb24 tablet Commonly known as: TOVIAZ Take 1 tablet (8 mg total) by mouth daily.   Fish Oil 1000 MG Caps Take 1,000 mg by mouth daily.   fluticasone 50 MCG/ACT nasal spray Commonly known as: FLONASE Place 1 spray into both nostrils 2 (two) times daily.   magnesium oxide 400 MG tablet Commonly known as: MAG-OX Take 400 mg by mouth daily.   meloxicam 7.5 MG tablet Commonly known as: Mobic Take 1 tablet (7.5 mg total) by mouth daily.   mirabegron ER 50 MG Tb24 tablet Commonly known as: MYRBETRIQ Take 1 tablet (50 mg total) by mouth daily.   Multi-Vitamins Tabs Take 1 tablet by mouth daily.   travoprost  (benzalkonium) 0.004 % ophthalmic solution Commonly known as: TRAVATAN Place 1 drop into both eyes at bedtime.        Allergies:  Allergies  Allergen Reactions   Mirabegron Other (See Comments)    Elevated BP   Furosemide Other (See Comments)    Legs swelling     Family History: Family History  Problem Relation Age of Onset   Diabetes Mother    Heart disease Mother    Bladder Cancer Neg Hx    Kidney cancer Neg Hx     Social History:  reports that she has never smoked. She has never used smokeless tobacco. She reports that she does not drink alcohol and does not use drugs.   Physical Exam: There were no vitals taken for this visit.  Constitutional:  Alert and oriented, No acute distress. HEENT: [place] AT, moist mucus membranes.  Trachea midline, no masses. Cardiovascular: No clubbing, cyanosis, or edema. Respiratory: Normal respiratory effort, no increased work of breathing. Skin: No rashes, bruises or suspicious lesions. Neurologic: Grossly intact, no focal deficits, moving all 4 extremities. Psychiatric: Normal mood and affect.  Laboratory Data:  Lab Results  Component Value Date   CREATININE 1.58 (H) 03/04/2021   No results found for: "HGBA1C"  Urinalysis   Pertinent Imaging:    Assessment & Plan:     No follow-ups on file.  I,Kailey Littlejohn,acting as a Neurosurgeon for [name], MD.,have documented all relevant documentation on the behalf of [name], MD,as directed by  [name], MD while in the presence of [name], MD.   [facility] [address], [address] [place], [place] [address] [phone]

## 2022-08-21 ENCOUNTER — Ambulatory Visit
Admission: RE | Admit: 2022-08-21 | Discharge: 2022-08-21 | Disposition: A | Discharge status: Home / Self Care | Payer: Medicare Other | Source: Ambulatory Visit | Attending: Urology | Admitting: Urology

## 2022-08-21 DIAGNOSIS — N2889 Other specified disorders of kidney and ureter: Secondary | ICD-10-CM | POA: Diagnosis present

## 2022-08-21 MED ORDER — GADOBUTROL 1 MMOL/ML IV SOLN
10.0000 mL | Freq: Once | INTRAVENOUS | Status: AC | PRN
  Administered 2022-08-21: 10 mL via INTRAVENOUS

## 2022-09-04 NOTE — Progress Notes (Signed)
09/05/2022 9:01 AM   Shelby [name] 11-02-1941 [id number]  Referring provider: Mickey Farber, MD [address] [place],  [place] [address]  Chief Complaint  Patient presents with   Follow-up    1 year follow-up    HPI: 81 year old female with a incidental small renal mass on with urinary frequency who returns today for follow-up with MRI.  Lesion in question was first imaged on CT with subsequent MRI performed on 07/27/2019 revealing an exophytic right posterior lateral renal lesion measuring 11 mm with internal enhancement suspicious for small renal cell carcinoma.  She has had additional Bosniak 1 and 2 renal cyst.  This is been followed serially with subsequent imaging.  Most recent imaging in the form of MRI completed 08/21/2022 shows similar 9 mm solid enhancing right interpolar lesion which has been stable.  She is also had an stable 8 mm pancreatic cyst.  She does have urinary urgency frequency on Myrbetriq 50 mg.  That was the most recent medication that was prescribed.  That being said, she has both Toviaz and Myrbetriq on her medication list and does not know which of the 2 she has been taking.  She describes as a little brown pill that she takes every morning.  She finds that has been extremely effective.  Per our record, it looks like Myrbetriq was the last medication prescribed.   PMH: Past Medical History:  Diagnosis Date   Anemia    Cardiomyopathy (HCC)    Ejection Fraction 30-35% per ECHO 2016   CHF (congestive heart failure) (HCC)    Diabetes (HCC)    Dysrhythmia    Hyperlipidemia    Hypertension    Peripheral neuropathy    Peripheral neuropathy    Sleep apnea     Surgical History: Past Surgical History:  Procedure Laterality Date   ABDOMINAL HYSTERECTOMY     COLONOSCOPY     COLONOSCOPY N/A 04/01/2018   Procedure: COLONOSCOPY;  Surgeon: Christena Deem, MD;  Location: [facility] ENDOSCOPY;  Service: Endoscopy;  Laterality: N/A;    COLONOSCOPY WITH PROPOFOL N/A 12/29/2017   Procedure: COLONOSCOPY WITH PROPOFOL;  Surgeon: Christena Deem, MD;  Location: [facility] ENDOSCOPY;  Service: Endoscopy;  Laterality: N/A;   ESOPHAGOGASTRODUODENOSCOPY (EGD) WITH PROPOFOL N/A 12/29/2017   Procedure: ESOPHAGOGASTRODUODENOSCOPY (EGD) WITH PROPOFOL;  Surgeon: Christena Deem, MD;  Location: [facility] ENDOSCOPY;  Service: Endoscopy;  Laterality: N/A;   ESOPHAGOGASTRODUODENOSCOPY (EGD) WITH PROPOFOL N/A 04/01/2018   Procedure: ESOPHAGOGASTRODUODENOSCOPY (EGD) WITH PROPOFOL;  Surgeon: Christena Deem, MD;  Location: [facility] ENDOSCOPY;  Service: Endoscopy;  Laterality: N/A;   EUS N/A 05/20/2018   Procedure: ESOPHAGEAL ENDOSCOPIC ULTRASOUND (EUS) RADIAL;  Surgeon: Bearl Mulberry, MD;  Location: [facility] ENDOSCOPY;  Service: Gastroenterology;  Laterality: N/A;    Home Medications:  Allergies as of 09/05/2022       Reactions   Mirabegron Other (See Comments)   Elevated BP   Furosemide Other (See Comments)   Legs swelling        Medication List        Accurate as of September 05, 2022  9:01 AM. If you have any questions, ask your nurse or doctor.          amLODipine 5 MG tablet Commonly known as: NORVASC Take 10 mg by mouth daily.   ascorbic acid 500 MG tablet Commonly known as: VITAMIN C Take 500 mg by mouth daily.   aspirin EC 81 MG tablet Take 81 mg by mouth daily.   atorvastatin  80 MG tablet Commonly known as: LIPITOR Take 80 mg by mouth daily.   brimonidine 0.2 % ophthalmic solution Commonly known as: ALPHAGAN Place 1 drop into both eyes 2 (two) times daily.   candesartan 32 MG tablet Commonly known as: ATACAND Take by mouth daily.   carvedilol 25 MG tablet Commonly known as: COREG Take by mouth 2 (two) times daily with a meal.   donepezil 10 MG tablet Commonly known as: ARICEPT Take 10 mg by mouth at bedtime.   ferrous sulfate 325 (65 FE) MG tablet Take by mouth. Takes one pill every 2-3 days to minimize  constipation   fesoterodine 8 MG Tb24 tablet Commonly known as: TOVIAZ Take 1 tablet (8 mg total) by mouth daily.   Fish Oil 1000 MG Caps Take 1,000 mg by mouth daily.   fluticasone 50 MCG/ACT nasal spray Commonly known as: FLONASE Place 1 spray into both nostrils 2 (two) times daily.   magnesium oxide 400 MG tablet Commonly known as: MAG-OX Take 400 mg by mouth daily.   meloxicam 7.5 MG tablet Commonly known as: Mobic Take 1 tablet (7.5 mg total) by mouth daily.   mirabegron ER 50 MG Tb24 tablet Commonly known as: MYRBETRIQ Take 1 tablet (50 mg total) by mouth daily.   Multi-Vitamins Tabs Take 1 tablet by mouth daily.   travoprost (benzalkonium) 0.004 % ophthalmic solution Commonly known as: TRAVATAN Place 1 drop into both eyes at bedtime.        Allergies:  Allergies  Allergen Reactions   Mirabegron Other (See Comments)    Elevated BP   Furosemide Other (See Comments)    Legs swelling     Family History: Family History  Problem Relation Age of Onset   Diabetes Mother    Heart disease Mother    Bladder Cancer Neg Hx    Kidney cancer Neg Hx     Social History:  reports that she has never smoked. She has never used smokeless tobacco. She reports that she does not drink alcohol and does not use drugs.   Physical Exam: There were no vitals taken for this visit.  Constitutional:  Alert and oriented, No acute distress. HEENT: [place] AT, moist mucus membranes.  Trachea midline, no masses. Neurologic: Grossly intact, no focal deficits, moving all 4 extremities. Psychiatric: Normal mood and affect.  Laboratory Data: Lab Results  Component Value Date   WBC 6.9 03/04/2021   HGB 11.9 (L) 03/04/2021   HCT 38.2 03/04/2021   MCV 94.6 03/04/2021   PLT 186 03/04/2021    Lab Results  Component Value Date   CREATININE 1.58 (H) 03/04/2021    Pertinent Imaging: IMPRESSION: 1. 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 2. 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 Recommend follow up pre and post contrast MRI/MRCP in 1 year. This recommendation follows ACR consensus guidelines: Management of Incidental Pancreatic Cysts: A White Paper of the ACR Incidental Findings Committee. J Am Coll Radiol 2017;14:911-923. 3. Stable enhancing lesion in the dome of the liver which is difficult to accurately characterize given its size and location but favored to reflect a hepatic hemangioma, continued attention on follow-up imaging suggested.     Electronically Signed   By: Maudry Mayhew M.D.   On: 08/22/2022 13:45  The above MRI images were personally  reviewed, agree with radiologic interpretation.  Assessment & Plan:    1. Renal mass Stable approximately 1 cm solid enhancing right renal mass, suspicious for very indolent RCC  Given his stability, recommend continued surveillance.  We will try to de-escalate the imaging modality again, plan for renal ultrasound next year in lieu of MRI.  She is agreeable this plan.  2. Urinary urgency Presumably on Myrbetriq 50 mg which is quite effective and the most appropriate medication given her history of some mild dementia  She will go home and review her medications, call back later this afternoon to confirm that in fact this is the medication she is been taking for OAB which is effective   Return in about 1 year (around 09/06/2023) for RUS.  Hollice Espy, MD  [facility] [address], [place], [place] [address] [phone]

## 2022-09-05 ENCOUNTER — Ambulatory Visit (INDEPENDENT_AMBULATORY_CARE_PROVIDER_SITE_OTHER): Payer: Medicare Other | Admitting: Urology

## 2022-09-05 VITALS — BP 152/83 | HR 79 | Ht 64.0 in | Wt 209.0 lb

## 2022-09-05 DIAGNOSIS — R3915 Urgency of urination: Secondary | ICD-10-CM

## 2022-09-05 DIAGNOSIS — N2889 Other specified disorders of kidney and ureter: Secondary | ICD-10-CM

## 2023-01-31 ENCOUNTER — Emergency Department: Payer: Medicare Other

## 2023-01-31 ENCOUNTER — Observation Stay: Payer: Medicare Other

## 2023-01-31 ENCOUNTER — Encounter: Payer: Self-pay | Admitting: Internal Medicine

## 2023-01-31 ENCOUNTER — Inpatient Hospital Stay
Admission: EM | Admit: 2023-01-31 | Discharge: 2023-02-02 | DRG: 292 | Disposition: A | Discharge status: Home / Self Care | Payer: Medicare Other | Attending: Student | Admitting: Student

## 2023-01-31 ENCOUNTER — Other Ambulatory Visit: Payer: Self-pay

## 2023-01-31 DIAGNOSIS — Z1152 Encounter for screening for COVID-19: Secondary | ICD-10-CM

## 2023-01-31 DIAGNOSIS — N179 Acute kidney failure, unspecified: Secondary | ICD-10-CM | POA: Insufficient documentation

## 2023-01-31 DIAGNOSIS — Z8249 Family history of ischemic heart disease and other diseases of the circulatory system: Secondary | ICD-10-CM

## 2023-01-31 DIAGNOSIS — R0902 Hypoxemia: Secondary | ICD-10-CM | POA: Insufficient documentation

## 2023-01-31 DIAGNOSIS — I10 Essential (primary) hypertension: Secondary | ICD-10-CM | POA: Diagnosis not present

## 2023-01-31 DIAGNOSIS — N183 Chronic kidney disease, stage 3 unspecified: Secondary | ICD-10-CM | POA: Diagnosis present

## 2023-01-31 DIAGNOSIS — F0390 Unspecified dementia without behavioral disturbance: Secondary | ICD-10-CM | POA: Diagnosis present

## 2023-01-31 DIAGNOSIS — M25552 Pain in left hip: Secondary | ICD-10-CM | POA: Diagnosis present

## 2023-01-31 DIAGNOSIS — I5032 Chronic diastolic (congestive) heart failure: Secondary | ICD-10-CM | POA: Diagnosis present

## 2023-01-31 DIAGNOSIS — E785 Hyperlipidemia, unspecified: Secondary | ICD-10-CM | POA: Diagnosis present

## 2023-01-31 DIAGNOSIS — N1832 Chronic kidney disease, stage 3b: Secondary | ICD-10-CM | POA: Diagnosis not present

## 2023-01-31 DIAGNOSIS — I130 Hypertensive heart and chronic kidney disease with heart failure and stage 1 through stage 4 chronic kidney disease, or unspecified chronic kidney disease: Secondary | ICD-10-CM | POA: Diagnosis not present

## 2023-01-31 DIAGNOSIS — E669 Obesity, unspecified: Secondary | ICD-10-CM | POA: Diagnosis present

## 2023-01-31 DIAGNOSIS — Z90710 Acquired absence of both cervix and uterus: Secondary | ICD-10-CM

## 2023-01-31 DIAGNOSIS — Z79899 Other long term (current) drug therapy: Secondary | ICD-10-CM

## 2023-01-31 DIAGNOSIS — R0602 Shortness of breath: Secondary | ICD-10-CM | POA: Diagnosis not present

## 2023-01-31 DIAGNOSIS — Z6835 Body mass index (BMI) 35.0-35.9, adult: Secondary | ICD-10-CM

## 2023-01-31 DIAGNOSIS — E1122 Type 2 diabetes mellitus with diabetic chronic kidney disease: Secondary | ICD-10-CM | POA: Diagnosis present

## 2023-01-31 DIAGNOSIS — Z833 Family history of diabetes mellitus: Secondary | ICD-10-CM

## 2023-01-31 DIAGNOSIS — Z7982 Long term (current) use of aspirin: Secondary | ICD-10-CM

## 2023-01-31 DIAGNOSIS — G4733 Obstructive sleep apnea (adult) (pediatric): Secondary | ICD-10-CM | POA: Diagnosis present

## 2023-01-31 DIAGNOSIS — R7989 Other specified abnormal findings of blood chemistry: Secondary | ICD-10-CM | POA: Insufficient documentation

## 2023-01-31 HISTORY — DX: Dyspnea, unspecified: R06.00

## 2023-01-31 LAB — CBC
HCT: 40.9 % (ref 36.0–46.0)
Hemoglobin: 12.6 g/dL (ref 12.0–15.0)
MCH: 30.1 pg (ref 26.0–34.0)
MCHC: 30.8 g/dL (ref 30.0–36.0)
MCV: 97.6 fL (ref 80.0–100.0)
Platelets: 187 10*3/uL (ref 150–400)
RBC: 4.19 MIL/uL (ref 3.87–5.11)
RDW: 14.5 % (ref 11.5–15.5)
WBC: 5.8 10*3/uL (ref 4.0–10.5)
nRBC: 0 % (ref 0.0–0.2)

## 2023-01-31 LAB — URINALYSIS, ROUTINE W REFLEX MICROSCOPIC
Bilirubin Urine: NEGATIVE
Glucose, UA: NEGATIVE mg/dL
Hgb urine dipstick: NEGATIVE
Ketones, ur: NEGATIVE mg/dL
Leukocytes,Ua: NEGATIVE
Nitrite: NEGATIVE
Protein, ur: NEGATIVE mg/dL
Specific Gravity, Urine: 1.017 (ref 1.005–1.030)
pH: 5 (ref 5.0–8.0)

## 2023-01-31 LAB — COMPREHENSIVE METABOLIC PANEL
ALT: 26 U/L (ref 0–44)
AST: 38 U/L (ref 15–41)
Albumin: 4.1 g/dL (ref 3.5–5.0)
Alkaline Phosphatase: 52 U/L (ref 38–126)
Anion gap: 8 (ref 5–15)
BUN: 36 mg/dL — ABNORMAL HIGH (ref 8–23)
CO2: 37 mmol/L — ABNORMAL HIGH (ref 22–32)
Calcium: 10.8 mg/dL — ABNORMAL HIGH (ref 8.9–10.3)
Chloride: 100 mmol/L (ref 98–111)
Creatinine, Ser: 1.79 mg/dL — ABNORMAL HIGH (ref 0.44–1.00)
GFR, Estimated: 28 mL/min — ABNORMAL LOW (ref >60)
Glucose, Bld: 108 mg/dL — ABNORMAL HIGH (ref 70–99)
Potassium: 4.1 mmol/L (ref 3.5–5.1)
Sodium: 145 mmol/L (ref 135–145)
Total Bilirubin: 0.6 mg/dL (ref 0.3–1.2)
Total Protein: 7.4 g/dL (ref 6.5–8.1)

## 2023-01-31 LAB — RESP PANEL BY RT-PCR (RSV, FLU A&B, COVID)  RVPGX2
Influenza A by PCR: NEGATIVE
Influenza B by PCR: NEGATIVE
Resp Syncytial Virus by PCR: NEGATIVE
SARS Coronavirus 2 by RT PCR: NEGATIVE

## 2023-01-31 LAB — BLOOD GAS, VENOUS
Acid-Base Excess: 2 mmol/L (ref 0.0–2.0)
Bicarbonate: 27.3 mmol/L (ref 20.0–28.0)
O2 Saturation: 46.8 %
Patient temperature: 37
pCO2, Ven: 44 mmHg (ref 44–60)
pH, Ven: 7.4 (ref 7.25–7.43)
pO2, Ven: 33 mmHg (ref 32–45)

## 2023-01-31 LAB — TROPONIN I (HIGH SENSITIVITY)
Troponin I (High Sensitivity): 14 ng/L (ref <18)
Troponin I (High Sensitivity): 11 ng/L (ref <18)

## 2023-01-31 LAB — BRAIN NATRIURETIC PEPTIDE: B Natriuretic Peptide: 14.6 pg/mL (ref 0.0–100.0)

## 2023-01-31 LAB — D-DIMER, QUANTITATIVE: D-Dimer, Quant: 3.04 ug/mL-FEU — ABNORMAL HIGH (ref 0.00–0.50)

## 2023-01-31 MED ORDER — ONDANSETRON HCL 4 MG/2ML IJ SOLN: 4.0000 mg | Freq: Three times a day (TID) | INTRAMUSCULAR | Status: DC | PRN

## 2023-01-31 MED ORDER — GABAPENTIN 300 MG PO CAPS
300.0000 mg | ORAL_CAPSULE | Freq: Three times a day (TID) | ORAL | Status: DC
  Administered 2023-01-31 – 2023-02-02 (×5): 300 mg via ORAL
  Filled 2023-01-31 (×6): qty 1

## 2023-01-31 MED ORDER — TRAVOPROST (BAK FREE) 0.004 % OP SOLN
1.0000 [drp] | Freq: Every day | OPHTHALMIC | Status: DC
  Administered 2023-01-31 – 2023-02-01 (×2): 1 [drp] via OPHTHALMIC
  Filled 2023-01-31: qty 2.5

## 2023-01-31 MED ORDER — DONEPEZIL HCL 5 MG PO TABS
10.0000 mg | ORAL_TABLET | Freq: Every day | ORAL | Status: DC
  Administered 2023-01-31 – 2023-02-01 (×2): 10 mg via ORAL
  Filled 2023-01-31 (×2): qty 2

## 2023-01-31 MED ORDER — HEPARIN (PORCINE) 25000 UT/250ML-% IV SOLN
1300.0000 [IU]/h | INTRAVENOUS | Status: DC
  Administered 2023-01-31: 1300 [IU]/h via INTRAVENOUS
  Filled 2023-01-31: qty 250

## 2023-01-31 MED ORDER — FERROUS SULFATE 325 (65 FE) MG PO TABS
325.0000 mg | ORAL_TABLET | ORAL | Status: DC
  Administered 2023-02-02: 325 mg via ORAL
  Filled 2023-01-31: qty 1

## 2023-01-31 MED ORDER — HEPARIN BOLUS VIA INFUSION
5400.0000 [IU] | Freq: Once | INTRAVENOUS | Status: AC
  Administered 2023-01-31: 5400 [IU] via INTRAVENOUS
  Filled 2023-01-31: qty 5400

## 2023-01-31 MED ORDER — HYDRALAZINE HCL 20 MG/ML IJ SOLN: 5.0000 mg | INTRAMUSCULAR | Status: DC | PRN

## 2023-01-31 MED ORDER — ALBUTEROL SULFATE (2.5 MG/3ML) 0.083% IN NEBU: 2.5000 mg | INHALATION_SOLUTION | RESPIRATORY_TRACT | Status: DC | PRN

## 2023-01-31 MED ORDER — VITAMIN C 500 MG PO TABS
500.0000 mg | ORAL_TABLET | Freq: Every day | ORAL | Status: DC
  Administered 2023-02-01 – 2023-02-02 (×2): 500 mg via ORAL
  Filled 2023-01-31 (×2): qty 1

## 2023-01-31 MED ORDER — AMLODIPINE BESYLATE 10 MG PO TABS
10.0000 mg | ORAL_TABLET | Freq: Every day | ORAL | Status: DC
  Administered 2023-02-01 – 2023-02-02 (×2): 10 mg via ORAL
  Filled 2023-01-31 (×2): qty 1

## 2023-01-31 MED ORDER — ALBUTEROL SULFATE HFA 108 (90 BASE) MCG/ACT IN AERS: 2.0000 | INHALATION_SPRAY | RESPIRATORY_TRACT | Status: DC | PRN

## 2023-01-31 MED ORDER — OMEGA-3-ACID ETHYL ESTERS 1 G PO CAPS
1.0000 g | ORAL_CAPSULE | Freq: Every day | ORAL | Status: DC
  Administered 2023-01-31 – 2023-02-02 (×3): 1 g via ORAL
  Filled 2023-01-31 (×4): qty 1

## 2023-01-31 MED ORDER — MAGNESIUM OXIDE 400 MG PO TABS
400.0000 mg | ORAL_TABLET | Freq: Every day | ORAL | Status: DC
  Administered 2023-02-01 – 2023-02-02 (×2): 400 mg via ORAL
  Filled 2023-01-31 (×4): qty 1

## 2023-01-31 MED ORDER — MIRABEGRON ER 25 MG PO TB24
25.0000 mg | ORAL_TABLET | Freq: Every day | ORAL | Status: DC
  Administered 2023-01-31 – 2023-02-02 (×3): 25 mg via ORAL
  Filled 2023-01-31 (×4): qty 1

## 2023-01-31 MED ORDER — ADULT MULTIVITAMIN W/MINERALS CH
1.0000 | ORAL_TABLET | Freq: Every day | ORAL | Status: DC
  Administered 2023-02-01 – 2023-02-02 (×2): 1 via ORAL
  Filled 2023-01-31 (×2): qty 1

## 2023-01-31 MED ORDER — CARVEDILOL 25 MG PO TABS
25.0000 mg | ORAL_TABLET | Freq: Two times a day (BID) | ORAL | Status: DC
  Administered 2023-02-01 – 2023-02-02 (×3): 25 mg via ORAL
  Filled 2023-01-31 (×3): qty 1

## 2023-01-31 MED ORDER — ENOXAPARIN SODIUM 30 MG/0.3ML IJ SOSY
30.0000 mg | PREFILLED_SYRINGE | INTRAMUSCULAR | Status: DC
  Administered 2023-01-31: 30 mg via SUBCUTANEOUS
  Filled 2023-01-31: qty 0.3

## 2023-01-31 MED ORDER — ATORVASTATIN CALCIUM 20 MG PO TABS
80.0000 mg | ORAL_TABLET | Freq: Every day | ORAL | Status: DC
  Administered 2023-01-31 – 2023-02-02 (×3): 80 mg via ORAL
  Filled 2023-01-31 (×3): qty 4

## 2023-01-31 MED ORDER — DM-GUAIFENESIN ER 30-600 MG PO TB12: 1.0000 | ORAL_TABLET | Freq: Two times a day (BID) | ORAL | Status: DC | PRN

## 2023-01-31 MED ORDER — ACETAMINOPHEN 325 MG PO TABS: 650.0000 mg | ORAL_TABLET | Freq: Four times a day (QID) | ORAL | Status: DC | PRN

## 2023-01-31 NOTE — Progress Notes (Signed)
PATIENT IS HER DEPENDENT SISTERS TOTAL CAREGIVER. STATES CARING FOR HER SISTER IS BECOMING TOO EXHAUSTIVE AND WOULD APPRECIATE INFORMATION REGARDING WHAT ASSISTANCE IS AVAILABLE TO HELP HER CARE FOR HER SISTER WHO HAS ALZHEIMER'S DEMENTIA.

## 2023-01-31 NOTE — ED Notes (Signed)
Advised nurse that patient has ready bed 

## 2023-01-31 NOTE — ED Triage Notes (Signed)
Pt states coming in for weakness for the last several days. Pt's oxygen was 82% in triage. Pt states she does not use oxygen at home. Pt placed on 2lpm and oxygen went up to 95%. Pt denies shortness of breath or difficulty breathing. Pt states getting tired easily though

## 2023-01-31 NOTE — H&P (Signed)
History and Physical    Mayanna Hetz [id number] DOB: 13-Nov-1941 DOA: 01/31/2023  Referring MD/NP/PA:   PCP: Ezequiel Kayser, MD   Patient coming from:  The patient is coming from home.      Chief Complaint: SOB  HPI: Shelby [name] is a 82 y.o. female with medical history significant of dCHF, hypertension, hyperlipidemia, diet-controlled diabetes, dementia, CKD 3A, anemia, OSA not using CPAP, obesity obesity BMI 35.87, who presents with shortness breath.  Patient states that she has has shortness of breath for about 2 weeks.  She has mild dry cough and mild chest heaviness, denies active chest pain.  No fever or chills.  Patient is not using oxygen normally, but was found to have oxygen desaturation to 82% on room air, which improved to 92 to 94% on 2 L oxygen.  Patient has mild sore throat and runny nose.  No nausea,vomiting, diarrhea or abdominal pain.  No symptoms of UTI.  No leg edema. She has a generalized weakness. She tripped yesterday hitting her left hip on the couch but did not actually fall to the ground.  No head injury.  Data reviewed independently and ED Course: pt was found to have WBC 5.8, positive D-dimer 3.04, negative PCR for COVID, flu and RSV, worsening renal function, temperature normal, blood pressure 118/62, heart rate 59, RR 20.  Chest x-ray negative.  X-ray of left hip is negative for acute issues.  Patient is placed on TeleMed follow-up patient  EKG: I have personally reviewed.  Sinus rhythm, QTc 432, LAD, poor IV progression   Review of Systems:   General: no fevers, chills, no body weight gain, has fatigue HEENT: no blurry vision, hearing changes or sore throat Respiratory: has dyspnea, coughing, no wheezing CV: no chest pain, no palpitations GI: no nausea, vomiting, abdominal pain, diarrhea, constipation GU: no dysuria, burning on urination, increased urinary frequency, hematuria  Ext: no leg edema Neuro: no unilateral weakness,  numbness, or tingling, no vision change or hearing loss Skin: no rash, no skin tear. MSK: No muscle spasm, no deformity, no limitation of range of movement in spin Heme: No easy bruising.  Travel history: No recent long distant travel.   Allergy:  Allergies  Allergen Reactions   Furosemide Other (See Comments)    Legs swelling     Past Medical History:  Diagnosis Date   Anemia    Cardiomyopathy ([place])    Ejection Fraction 30-35% per ECHO 2016   CHF (congestive heart failure) ([place])    Diabetes ([place])    Dyspnea    Dysrhythmia    Hyperlipidemia    Hypertension    Peripheral neuropathy    Peripheral neuropathy    Sleep apnea     Past Surgical History:  Procedure Laterality Date   ABDOMINAL HYSTERECTOMY     COLONOSCOPY     COLONOSCOPY N/A 04/01/2018   Procedure: COLONOSCOPY;  Surgeon: Lollie Sails, MD;  Location: [facility] ENDOSCOPY;  Service: Endoscopy;  Laterality: N/A;   COLONOSCOPY WITH PROPOFOL N/A 12/29/2017   Procedure: COLONOSCOPY WITH PROPOFOL;  Surgeon: Lollie Sails, MD;  Location: [facility] ENDOSCOPY;  Service: Endoscopy;  Laterality: N/A;   ESOPHAGOGASTRODUODENOSCOPY (EGD) WITH PROPOFOL N/A 12/29/2017   Procedure: ESOPHAGOGASTRODUODENOSCOPY (EGD) WITH PROPOFOL;  Surgeon: Lollie Sails, MD;  Location: [facility] ENDOSCOPY;  Service: Endoscopy;  Laterality: N/A;   ESOPHAGOGASTRODUODENOSCOPY (EGD) WITH PROPOFOL N/A 04/01/2018   Procedure: ESOPHAGOGASTRODUODENOSCOPY (EGD) WITH PROPOFOL;  Surgeon: Lollie Sails, MD;  Location: [facility] ENDOSCOPY;  Service: Endoscopy;  Laterality: N/A;   EUS N/A 05/20/2018   Procedure: ESOPHAGEAL ENDOSCOPIC ULTRASOUND (EUS) RADIAL;  Surgeon: Holly Bodily, MD;  Location: [facility] ENDOSCOPY;  Service: Gastroenterology;  Laterality: N/A;    Social History:  reports that she has never smoked. She has never used smokeless tobacco. She reports that she does not drink alcohol and does not use drugs.  Family History:  Family History   Problem Relation Age of Onset   Diabetes Mother    Heart disease Mother    Bladder Cancer Neg Hx    Kidney cancer Neg Hx      Prior to Admission medications   Medication Sig Start Date End Date Taking? Authorizing Provider  amLODipine (NORVASC) 5 MG tablet Take 10 mg by mouth daily.  12/11/15   [provider]  ascorbic acid (VITAMIN C) 500 MG tablet Take 500 mg by mouth daily.    [provider]  aspirin EC 81 MG tablet Take 81 mg by mouth daily.    [provider]  atorvastatin (LIPITOR) 80 MG tablet Take 80 mg by mouth daily.  12/11/15   [provider]  brimonidine (ALPHAGAN) 0.2 % ophthalmic solution Place 1 drop into both eyes 2 (two) times daily.     [provider]  candesartan (ATACAND) 32 MG tablet Take by mouth daily.  07/15/16   [provider]  carvedilol (COREG) 25 MG tablet Take by mouth 2 (two) times daily with a meal.  07/15/16   [provider]  donepezil (ARICEPT) 10 MG tablet Take 10 mg by mouth at bedtime.    [provider]  ferrous sulfate 325 (65 FE) MG tablet Take by mouth. Takes one pill every 2-3 days to minimize constipation    [provider]  fesoterodine (TOVIAZ) 8 MG TB24 tablet Take 1 tablet (8 mg total) by mouth daily. 03/11/18   [place]an, Larene Beach A, PA-C  fluticasone (FLONASE) 50 MCG/ACT nasal spray Place 1 spray into both nostrils 2 (two) times daily. 11/14/21   Rodriguez-Southworth, Sunday Spillers, PA-C  magnesium oxide (MAG-OX) 400 MG tablet Take 400 mg by mouth daily.     [provider]  meloxicam (MOBIC) 7.5 MG tablet Take 1 tablet (7.5 mg total) by mouth daily. 11/14/21   Rodriguez-Southworth, Sunday Spillers, PA-C  mirabegron ER (MYRBETRIQ) 50 MG TB24 tablet Take 1 tablet (50 mg total) by mouth daily. 12/28/20   Hollice Espy, MD  Multiple Vitamin (MULTI-VITAMINS) TABS Take 1 tablet by mouth daily.     [provider]  Omega-3 Fatty Acids (FISH OIL) 1000 MG CAPS Take 1,000 mg by  mouth daily.    [provider]  travoprost, benzalkonium, (TRAVATAN) 0.004 % ophthalmic solution Place 1 drop into both eyes at bedtime.     [provider]    Physical Exam: Vitals:   01/31/23 1210 01/31/23 1500 01/31/23 1515 01/31/23 1536  BP:  104/60  101/72  Pulse: (!) 59 62  63  Resp:  16  20  Temp:  98.2 F (36.8 C)  97.6 F (36.4 C)  TempSrc:      SpO2: 92% 91% 96% 96%  Weight:      Height:       General: Not in acute distress HEENT:       Eyes: PERRL, EOMI, no scleral icterus.       ENT: No discharge from the ears and nose, no pharynx injection, no tonsillar enlargement.        Neck: No JVD, no bruit,  no mass felt. Heme: No neck lymph node enlargement. Cardiac: S1/S2, RRR, No murmurs, No gallops or rubs. Respiratory: No rales, wheezing, rhonchi or rubs. GI: Soft, nondistended, nontender, no rebound pain, no organomegaly, BS present. GU: No hematuria Ext: No pitting leg edema bilaterally. 1+DP/PT pulse bilaterally. Musculoskeletal: No joint deformities, No joint redness or warmth, no limitation of ROM in spin. Skin: No rashes.  Neuro: Alert, oriented X3, cranial nerves II-XII grossly intact, moves all extremities normally.  Psych: Patient is not psychotic, no suicidal or hemocidal ideation.  Labs on Admission: I have personally reviewed following labs and imaging studies  CBC: Recent Labs  Lab 01/31/23 0906  WBC 5.8  HGB 12.6  HCT 40.9  MCV 97.6  PLT [id number]   Basic Metabolic Panel: Recent Labs  Lab 01/31/23 0906  NA 145  K 4.1  CL 100  CO2 37*  GLUCOSE 108*  BUN 36*  CREATININE 1.79*  CALCIUM 10.8*   GFR: Estimated Creatinine Clearance: 28.4 mL/min (A) (by C-G formula based on SCr of 1.79 mg/dL (H)). Liver Function Tests: Recent Labs  Lab 01/31/23 0906  AST 38  ALT 26  ALKPHOS 52  BILITOT 0.6  PROT 7.4  ALBUMIN 4.1   No results for input(s): "LIPASE", "AMYLASE" in the last 168 hours. No results for input(s): "AMMONIA" in  the last 168 hours. Coagulation Profile: No results for input(s): "INR", "PROTIME" in the last 168 hours. Cardiac Enzymes: No results for input(s): "CKTOTAL", "CKMB", "CKMBINDEX", "TROPONINI" in the last 168 hours. BNP (last 3 results) No results for input(s): "PROBNP" in the last 8760 hours. HbA1C: No results for input(s): "HGBA1C" in the last 72 hours. CBG: No results for input(s): "GLUCAP" in the last 168 hours. Lipid Profile: No results for input(s): "CHOL", "HDL", "LDLCALC", "TRIG", "CHOLHDL", "LDLDIRECT" in the last 72 hours. Thyroid Function Tests: No results for input(s): "TSH", "T4TOTAL", "FREET4", "T3FREE", "THYROIDAB" in the last 72 hours. Anemia Panel: No results for input(s): "VITAMINB12", "FOLATE", "FERRITIN", "TIBC", "IRON", "RETICCTPCT" in the last 72 hours. Urine analysis:    Component Value Date/Time   COLORURINE YELLOW (A) 03/04/2021 1410   APPEARANCEUR CLEAR (A) 03/04/2021 1410   APPEARANCEUR Clear 02/16/2018 0910   LABSPEC 1.024 03/04/2021 1410   PHURINE 5.0 03/04/2021 1410   GLUCOSEU NEGATIVE 03/04/2021 1410   HGBUR NEGATIVE 03/04/2021 1410   BILIRUBINUR NEGATIVE 03/04/2021 1410   BILIRUBINUR Negative 02/16/2018 0910   KETONESUR NEGATIVE 03/04/2021 1410   PROTEINUR NEGATIVE 03/04/2021 1410   NITRITE POSITIVE (A) 03/04/2021 1410   LEUKOCYTESUR SMALL (A) 03/04/2021 1410   Sepsis Labs: '@LABRCNTIP'$ (procalcitonin:4,lacticidven:4) ) Recent Results (from the past 240 hour(s))  Resp panel by RT-PCR (RSV, Flu A&B, Covid) Anterior Nasal Swab     Status: None   Collection Time: 01/31/23  9:06 AM   Specimen: Anterior Nasal Swab  Result Value Ref Range Status   SARS Coronavirus 2 by RT PCR NEGATIVE NEGATIVE Final    Comment: (NOTE) SARS-CoV-2 target nucleic acids are NOT DETECTED.  The SARS-CoV-2 RNA is generally detectable in upper respiratory specimens during the acute phase of infection. The lowest concentration of SARS-CoV-2 viral copies this assay can  detect is 138 copies/mL. A negative result does not preclude SARS-Cov-2 infection and should not be used as the sole basis for treatment or other patient management decisions. A negative result may occur with  improper specimen collection/handling, submission of specimen other than nasopharyngeal swab, presence of viral mutation(s) within the areas targeted by this assay, and inadequate number of viral  copies(<138 copies/mL). A negative result must be combined with clinical observations, patient history, and epidemiological information. The expected result is Negative.  Fact Sheet for Patients:  EntrepreneurPulse.com.au  Fact Sheet for Healthcare Providers:  IncredibleEmployment.be  This test is no t yet approved or cleared by the [place] FDA and  has been authorized for detection and/or diagnosis of SARS-CoV-2 by FDA under an Emergency Use Authorization (EUA). This EUA will remain  in effect (meaning this test can be used) for the duration of the COVID-19 declaration under Section 564(b)(1) of the Act, 21 U.S.C.section 360bbb-3(b)(1), unless the authorization is terminated  or revoked sooner.       Influenza A by PCR NEGATIVE NEGATIVE Final   Influenza B by PCR NEGATIVE NEGATIVE Final    Comment: (NOTE) The Xpert Xpress SARS-CoV-2/FLU/RSV plus assay is intended as an aid in the diagnosis of influenza from Nasopharyngeal swab specimens and should not be used as a sole basis for treatment. Nasal washings and aspirates are unacceptable for Xpert Xpress SARS-CoV-2/FLU/RSV testing.  Fact Sheet for Patients: EntrepreneurPulse.com.au  Fact Sheet for Healthcare Providers: IncredibleEmployment.be  This test is not yet approved or cleared by the [place] FDA and has been authorized for detection and/or diagnosis of SARS-CoV-2 by FDA under an Emergency Use Authorization (EUA). This EUA will remain in  effect (meaning this test can be used) for the duration of the COVID-19 declaration under Section 564(b)(1) of the Act, 21 U.S.C. section 360bbb-3(b)(1), unless the authorization is terminated or revoked.     Resp Syncytial Virus by PCR NEGATIVE NEGATIVE Final    Comment: (NOTE) Fact Sheet for Patients: EntrepreneurPulse.com.au  Fact Sheet for Healthcare Providers: IncredibleEmployment.be  This test is not yet approved or cleared by the [place] FDA and has been authorized for detection and/or diagnosis of SARS-CoV-2 by FDA under an Emergency Use Authorization (EUA). This EUA will remain in effect (meaning this test can be used) for the duration of the COVID-19 declaration under Section 564(b)(1) of the Act, 21 U.S.C. section 360bbb-3(b)(1), unless the authorization is terminated or revoked.  Performed at [facility], [address] [place], [place] [address]      Radiological Exams on Admission: US Venous Img Lower Bilateral (DVT)  Result Date: 01/31/2023 CLINICAL DATA:  Positive D-dimer EXAM: BILATERAL LOWER EXTREMITY VENOUS DOPPLER ULTRASOUND TECHNIQUE: Gray-scale sonography with graded compression, as well as color Doppler and duplex ultrasound were performed to evaluate the lower extremity deep venous systems from the level of the common femoral vein and including the common femoral, femoral, profunda femoral, popliteal and calf veins including the posterior tibial, peroneal and gastrocnemius veins when visible. The superficial great saphenous vein was also interrogated. Spectral Doppler was utilized to evaluate flow at rest and with distal augmentation maneuvers in the common femoral, femoral and popliteal veins. COMPARISON:  None Available. FINDINGS: RIGHT LOWER EXTREMITY Common Femoral Vein: No evidence of thrombus. Normal compressibility, respiratory phasicity and response to augmentation. Saphenofemoral Junction: No evidence  of thrombus. Normal compressibility and flow on color Doppler imaging. Profunda Femoral Vein: No evidence of thrombus. Normal compressibility and flow on color Doppler imaging. Femoral Vein: No evidence of thrombus. Normal compressibility, respiratory phasicity and response to augmentation. Popliteal Vein: No evidence of thrombus. Normal compressibility, respiratory phasicity and response to augmentation. Calf Veins: No evidence of thrombus. Normal compressibility and flow on color Doppler imaging. Superficial Great Saphenous Vein: No evidence of thrombus. Normal compressibility. Venous Reflux:  None. Other Findings:  None. LEFT LOWER EXTREMITY  Common Femoral Vein: No evidence of thrombus. Normal compressibility, respiratory phasicity and response to augmentation. Saphenofemoral Junction: No evidence of thrombus. Normal compressibility and flow on color Doppler imaging. Profunda Femoral Vein: No evidence of thrombus. Normal compressibility and flow on color Doppler imaging. Femoral Vein: No evidence of thrombus. Normal compressibility, respiratory phasicity and response to augmentation. Popliteal Vein: No evidence of thrombus. Normal compressibility, respiratory phasicity and response to augmentation. Calf Veins: No evidence of thrombus. Normal compressibility and flow on color Doppler imaging. Superficial Great Saphenous Vein: No evidence of thrombus. Normal compressibility. Venous Reflux:  None. Other Findings:  None. IMPRESSION: No evidence of deep venous thrombosis in either lower extremity. Electronically Signed   By: Jacqulynn Cadet M.D.   On: 01/31/2023 15:38   DG Hip Unilat W or Wo Pelvis 2-3 Views Left  Result Date: 01/31/2023 CLINICAL DATA:  LEFT hip pain after injury. EXAM: DG HIP (WITH OR WITHOUT PELVIS) 2-3V LEFT COMPARISON:  None Available. FINDINGS: Single-view of the pelvis and two views of the LEFT hip are provided. Osseous alignment is normal. Bone mineralization is normal. No fracture line  or displaced fracture fragment is seen. No significant degenerative change at the LEFT hip. Soft tissues about the pelvis and LEFT hip are unremarkable. Degenerative spondylosis is seen within the lower lumbar spine, at least moderate in degree, incompletely imaged. IMPRESSION: 1. No acute findings. No osseous fracture or dislocation. 2. 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 Electronically Signed   By: Franki Cabot M.D.   On: 01/31/2023 10:47   DG Chest 2 View  Result Date: 01/31/2023 CLINICAL DATA:  Shortness of breath, weakness. EXAM: CHEST - 2 VIEW COMPARISON:  Chest x-ray dated 01/15/2019 FINDINGS: Heart size and mediastinal contours are stable. Lungs are clear. No pleural effusion or pneumothorax is seen. Stable chronic elevation of the RIGHT hemidiaphragm. Degenerative spondylosis of the mid thoracic spine, mild to moderate in degree. Osseous structures about the chest are otherwise unremarkable. IMPRESSION: No active cardiopulmonary disease. No evidence of pneumonia or pulmonary edema. Electronically Signed   By: Franki Cabot M.D.   On: 01/31/2023 09:19      Assessment/Plan Principal Problem:   SOB (shortness of breath) Active Problems:   Benign essential hypertension   Hyperlipidemia   Chronic diastolic CHF (congestive heart failure) (HCC)   Chronic kidney disease, stage 3b (HCC)   Hypercalcemia   Dementia (HCC)   Obesity (BMI 30-39.9)   Assessment and Plan:   SOB (shortness of breath): Patient has mild shortness of breath, no respiratory distress, has 2 L new oxygen requirement.  Etiology is not clear.  Chest x-ray negative, no leukocytosis, no pneumonia.  BNP normal 14.6, no leg edema, does not seem to have CHF exacerbation.  D-dimer positive, will need to rule out PE  -Placed on TeleMed follow-up patient -Follow-up VQ scan - f/u Lower extremity venous Doppler --> negative -As needed albuterol and  Mucinex -Nasal cannula oxygen to maintain oxygen saturation above 93% -will start IV heparin empirically while pending VQ scan  Benign essential hypertension: -Amlodipine, Coreg -IV hydralazine as needed  Hyperlipidemia -Lipitor  Chronic diastolic CHF (congestive heart failure) ([place]): 2D echo on 07/25/2019 showed EF of 55-60%.  Patient does not have leg edema.  BNP normal 14.6.  CHF is compensated. -Hold torsemide due to worsening renal function  Chronic kidney disease, stage 3b [facility]): Recent baseline creatinine 1.3-1.5.  Her creatinine is 1.79, BUN 36, GFR 28.  Patient has  slightly worsening renal function. -Hold torsemide  Hypercalcemia: Mild, calcium level 10.8.  Possibly due to dehydration -Hold Lasix -Repeated BMP morning  Dementia ([place]): No behavior disturbance.  Patient is oriented x 3. -Donepezil  Obesity (BMI 30-39.9): Body weight 94.8, BMI 35.87 -Healthy diet and exercise -Encourage losing weight    DVT ppx:  On VI heparin  Code Status: Full code  Family Communication:  Yes, patient's son  by phone  Disposition Plan:  Anticipate discharge back to previous environment  Consults called:  none  Admission status and Level of care: Telemetry Medical:   for obs     Dispo: The patient is from: Home              Anticipated d/c is to: Home              Anticipated d/c date is: 1 day              Patient currently is not medically stable to d/c.    Severity of Illness:  The appropriate patient status for this patient is OBSERVATION. Observation status is judged to be reasonable and necessary in order to provide the required intensity of service to ensure the patient's safety. The patient's presenting symptoms, physical exam findings, and initial radiographic and laboratory data in the context of their medical condition is felt to place them at decreased risk for further clinical deterioration. Furthermore, it is anticipated that the patient will be medically  stable for discharge from the hospital within 2 midnights of admission.        Date of Service 01/31/2023    Ivor Costa [facility]   If 7PM-7AM, please contact night-coverage [email or web address] 01/31/2023, 6:57 PM

## 2023-01-31 NOTE — ED Provider Notes (Signed)
[facility] Provider Note    Event Date/Time   First MD Initiated Contact with Patient 01/31/23 [phone]     (approximate)   History   Weakness (Generalized/)   HPI  Sakoya Hinkel is a 82 y.o. female past medical history of CHF last EF in 2020 55-60%, diabetes, anemia, hyperlipidemia who presents with generalized weakness.  Patient tells me that for several weeks she has been feeling tired occasionally gets short of breath.  She has had some more difficulty ambulating but is using a cane currently.  She tripped yesterday hitting her left hip on the couch but did not actually fall to the ground.  Does have some left hip pain but is ambulating okay.  She denies significant cough does have mild runny nose and some sore throat.  Denies chest pain abdominal pain vomiting diarrhea.  Does have decreased appetite and has had some weight loss.  Denies blood in her stool occasionally notes dark school after she eats multiple grapes with this is not consistent.     Past Medical History:  Diagnosis Date   Anemia    Cardiomyopathy ([place])    Ejection Fraction 30-35% per ECHO 2016   CHF (congestive heart failure) ([place])    Diabetes ([place])    Dysrhythmia    Hyperlipidemia    Hypertension    Peripheral neuropathy    Peripheral neuropathy    Sleep apnea     Patient Active Problem List   Diagnosis Date Noted   SOB (shortness of breath) 01/31/2023   Chronic diastolic CHF (congestive heart failure) ([place]) 01/31/2023   Dementia ([place]) 01/31/2023   Chronic kidney disease, stage 3b ([place]) 01/31/2023   Hypercalcemia 01/31/2023   Obesity (BMI 30-39.9) 01/31/2023   Lymphedema [id number]   Chronic systolic heart failure ([place]) 03/08/2018   Encounter for dental exam and cleaning w/o abnormal findings 01/14/2018   High risk medication use 12/11/2015   Left lumbar radiculopathy 12/05/2014   Anemia 06/12/2014   Cardiomyopathy ([place]) 06/12/2014   Benign essential hypertension  06/12/2014   Hyperlipidemia 06/12/2014   Obstructive sleep apnea 06/12/2014   Peripheral neuropathy 06/12/2014   Proteinuria 06/12/2014   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[place]) [id number]   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 --      Excl. in Power? --     Most recent vital signs: Vitals:   01/31/23 1515 01/31/23 1536  BP:  101/72  Pulse:  63  Resp:  20  Temp:  97.6 F (36.4 C)  SpO2: 96% 96%     General: Awake, no distress.  CV:  Good peripheral perfusion.  No peripheral edema Resp:  Normal effort. Lungs are clear, nml wob Abd:  No distention. Soft, nontender   Neuro:             Awake, Alert, Oriented x 3  Other:     ED Results / Procedures / Treatments  Labs (all labs ordered are listed, but only abnormal results are displayed) Labs Reviewed  COMPREHENSIVE METABOLIC PANEL - Abnormal; Notable for the following components:      Result Value   CO2 37 (*)    Glucose, Bld 108 (*)    BUN 36 (*)    Creatinine, Ser 1.79 (*)    Calcium 10.8 (*)    GFR, Estimated 28 (*)    All other components within normal limits  D-DIMER, QUANTITATIVE - Abnormal; Notable for the following components:   D-Dimer, Quant 3.04 (*)    All other components within normal limits  RESP PANEL BY RT-PCR (RSV, FLU A&B, COVID)  RVPGX2  CBC  BRAIN NATRIURETIC PEPTIDE  BLOOD GAS, VENOUS   URINALYSIS, ROUTINE W REFLEX MICROSCOPIC  CBG MONITORING, ED  TROPONIN I (HIGH SENSITIVITY)  TROPONIN I (HIGH SENSITIVITY)     EKG  EKG reviewed inter myself shows normal sinus rhythm normal axis normal intervals T wave flattening throughout the precordium   RADIOLOGY Reviewed interpreted the x-ray of the left hip which is negative for fracture   PROCEDURES:  Critical Care performed: No  .Critical Care  Performed by: Rada Hay, MD Authorized by: Rada Hay, MD   Critical care provider statement:    Critical care time (minutes):  30   Critical care was time spent personally by me on the following activities:  Development of treatment plan with patient or surrogate, discussions with consultants, evaluation of patient's response to treatment, examination of patient, ordering and review of laboratory studies, ordering and review of radiographic studies, ordering and performing treatments and interventions, pulse oximetry, re-evaluation of patient's condition and review of old charts   The patient is on the cardiac monitor to evaluate for evidence of arrhythmia and/or significant heart rate changes.   MEDICATIONS ORDERED IN ED: Medications  dextromethorphan-guaiFENesin (MUCINEX DM) 30-600 MG per 12 hr tablet 1 tablet (has no administration in time range)  ondansetron (ZOFRAN) injection 4 mg (has no administration in time range)  acetaminophen (TYLENOL) tablet 650 mg (has no administration in time range)  albuterol (PROVENTIL) (2.5 MG/3ML) 0.083% nebulizer solution 2.5 mg (has no administration in time range)  enoxaparin (LOVENOX) injection 30 mg (has no administration in time range)  amLODipine (NORVASC) tablet 10 mg (has no administration in time range)  atorvastatin (LIPITOR) tablet 80 mg (has no administration in time range)  carvedilol (COREG) tablet 25 mg (has no administration in time range)  donepezil (ARICEPT) tablet 10 mg (has no administration in time  range)  magnesium oxide (MAG-OX) tablet 400 mg (has no administration in time range)  mirabegron ER (MYRBETRIQ) tablet 25 mg (has no administration in time range)  ferrous sulfate tablet 325 mg (has no administration in time range)  gabapentin (NEURONTIN) capsule 300 mg (has no administration in time range)  ascorbic acid (VITAMIN C) tablet 500 mg (has no administration in time range)  multivitamin with minerals tablet 1 tablet (has no administration in time range)  omega-3 acid ethyl esters (LOVAZA) capsule 1 g (has no administration in time range)  Travoprost (BAK Free) (TRAVATAN) 0.004 % ophthalmic solution SOLN 1 drop (has no administration in  time range)  hydrALAZINE (APRESOLINE) injection 5 mg (has no administration in time range)     IMPRESSION / MDM / ASSESSMENT AND PLAN / ED COURSE  I reviewed the triage vital signs and the nursing notes.                              Patient's presentation is most consistent with acute presentation with potential threat to life or bodily function.  Differential diagnosis includes, but is not limited to, anemia, renal failure, malignancy, ACS, volume overload, oral illness  The patient is a 82 year old who presents with subacute weakness and dyspnea.  She has been feeling very fatigued for several weeks she also has some dyspnea on exertion.  Yesterday she tripped falling into her left hip and has had some pain there and this is why she specifically comes to the ED today.  Initially she is hypoxic with a room air sat documented at 82% placed on 2 L.  When I am in the room she is satting 99% on 2 L so I took her off and she is maintaining her saturations.  She denies current dyspnea but does states she feels short of breath with ambulation she has not had chest pain.  She has had some poor appetite little bit of nasal congestion but most of this has been subacute.  Think she is losing some weight.  The rest of her vitals are reassuring.  On exam  overall she looks well lung sounds are clear she has no peripheral edema hip is mildly tender on the left but she is able to range of low suspicion for fracture abdomen is benign.  EKG is without clear ischemic changes chest x-ray clear obtained CBC CMP troponin and BMP which are overall reassuring.  Troponin is negative BNP is normal.  Patient's bicarb is elevated which I suspect is from chronic CO2 retention.  Cr is 1.79 which is not far from baseline.  COVID and flu test is negative.    When I went to reassess the patient again she was off of the pulse ox.  Placed her back on initial sat is 86%.  I watched her for a while and asked her to take some deep breaths this rise to about 93%.  I then had the patient ambulate with nursing and she desatted to 86% but was not dyspneic.  Overall given this ongoing hypoxia I do not feel that she will be appropriate to be discharged.  Will send a VBG and d dimer. She will need admission.   VBG does not show hypercarbia.  D-dimer is positive.  Patient's GFR is 28 so think it is best to get a nuclear medicine VQ scan.  Hospitalist will admit the patient.      FINAL CLINICAL IMPRESSION(S) / ED DIAGNOSES   Final diagnoses:  Hypoxia     Rx / DC Orders   ED Discharge Orders     None        Note:  This document was prepared using Dragon voice recognition software and may include unintentional dictation errors.   Rada Hay, MD 01/31/23 1600

## 2023-01-31 NOTE — Consult Note (Signed)
ANTICOAGULATION CONSULT NOTE - Initial Consult  Pharmacy Consult for heparin infusion Indication: pulmonary embolus  Allergies  Allergen Reactions   Furosemide Other (See Comments)    Legs swelling     Patient Measurements: Height: '5\' 4"'$  (162.6 cm) Weight: 94.8 kg (208 lb 15.9 oz) IBW/kg (Calculated) : 54.7 Heparin Dosing Weight: 76.3kg   Vital Signs: Temp: 97.6 F (36.4 C) (02/24 1536) Temp Source: Oral (02/24 0841) BP: 101/72 (02/24 1536) Pulse Rate: 63 (02/24 1536)  Labs: Recent Labs    01/31/23 0906 01/31/23 1146  HGB 12.6  --   HCT 40.9  --   PLT 187  --   CREATININE 1.79*  --   TROPONINIHS 14 11    Estimated Creatinine Clearance: 28.4 mL/min (A) (by C-G formula based on SCr of 1.79 mg/dL (H)).   Medical History: Past Medical History:  Diagnosis Date   Anemia    Cardiomyopathy ([place])    Ejection Fraction 30-35% per ECHO 2016   CHF (congestive heart failure) ([facility])    Diabetes ([place])    Dyspnea    Dysrhythmia    Hyperlipidemia    Hypertension    Peripheral neuropathy    Peripheral neuropathy    Sleep apnea     Medications:  PTA: N/A Inpatient: Heparin infusion (2/24 >>) Allergies: No AC/APT related allergies  Assessment: 82 year old female with PMH obesity, OSA not using CPAP, presents to ED with shortness of breath with desaturation to 82% on room air, which improved 94% on 2L O2. D dimer positive and VQ scan pending. Pharmacy consulted for empiric IV heparin while pending VQ scan given concern for PE.   Date Time aPTT/HL Rate/Comment   Goal of Therapy:  Heparin level 0.3-0.7 units/ml Monitor platelets by anticoagulation protocol: Yes   Plan:  Give 5400 units bolus x1; then start heparin infusion at 1300 units/hr Check anti-Xa level in 8 hours and daily once consecutively therapeutic. Continue to monitor H&H and platelets daily while on heparin gtt.  Darrick Penna Clinical Pharmacist 01/31/2023 7:23 PM

## 2023-02-01 DIAGNOSIS — Z6835 Body mass index (BMI) 35.0-35.9, adult: Secondary | ICD-10-CM | POA: Diagnosis not present

## 2023-02-01 DIAGNOSIS — F02A0 Dementia in other diseases classified elsewhere, mild, without behavioral disturbance, psychotic disturbance, mood disturbance, and anxiety: Secondary | ICD-10-CM

## 2023-02-01 DIAGNOSIS — E785 Hyperlipidemia, unspecified: Secondary | ICD-10-CM | POA: Diagnosis not present

## 2023-02-01 DIAGNOSIS — I5032 Chronic diastolic (congestive) heart failure: Secondary | ICD-10-CM | POA: Diagnosis not present

## 2023-02-01 DIAGNOSIS — Z8249 Family history of ischemic heart disease and other diseases of the circulatory system: Secondary | ICD-10-CM | POA: Diagnosis not present

## 2023-02-01 DIAGNOSIS — R0902 Hypoxemia: Secondary | ICD-10-CM | POA: Diagnosis present

## 2023-02-01 DIAGNOSIS — Z79899 Other long term (current) drug therapy: Secondary | ICD-10-CM | POA: Diagnosis not present

## 2023-02-01 DIAGNOSIS — Z1152 Encounter for screening for COVID-19: Secondary | ICD-10-CM | POA: Diagnosis not present

## 2023-02-01 DIAGNOSIS — G309 Alzheimer's disease, unspecified: Secondary | ICD-10-CM

## 2023-02-01 DIAGNOSIS — M25552 Pain in left hip: Secondary | ICD-10-CM | POA: Diagnosis present

## 2023-02-01 DIAGNOSIS — R0602 Shortness of breath: Secondary | ICD-10-CM | POA: Diagnosis not present

## 2023-02-01 DIAGNOSIS — E669 Obesity, unspecified: Secondary | ICD-10-CM | POA: Diagnosis present

## 2023-02-01 DIAGNOSIS — Z833 Family history of diabetes mellitus: Secondary | ICD-10-CM | POA: Diagnosis not present

## 2023-02-01 DIAGNOSIS — N179 Acute kidney failure, unspecified: Secondary | ICD-10-CM

## 2023-02-01 DIAGNOSIS — E1122 Type 2 diabetes mellitus with diabetic chronic kidney disease: Secondary | ICD-10-CM | POA: Diagnosis present

## 2023-02-01 DIAGNOSIS — I130 Hypertensive heart and chronic kidney disease with heart failure and stage 1 through stage 4 chronic kidney disease, or unspecified chronic kidney disease: Secondary | ICD-10-CM | POA: Diagnosis present

## 2023-02-01 DIAGNOSIS — R7989 Other specified abnormal findings of blood chemistry: Secondary | ICD-10-CM | POA: Insufficient documentation

## 2023-02-01 DIAGNOSIS — Z90710 Acquired absence of both cervix and uterus: Secondary | ICD-10-CM | POA: Diagnosis not present

## 2023-02-01 DIAGNOSIS — Z7982 Long term (current) use of aspirin: Secondary | ICD-10-CM | POA: Diagnosis not present

## 2023-02-01 DIAGNOSIS — F0390 Unspecified dementia without behavioral disturbance: Secondary | ICD-10-CM | POA: Diagnosis present

## 2023-02-01 DIAGNOSIS — N1832 Chronic kidney disease, stage 3b: Secondary | ICD-10-CM | POA: Diagnosis present

## 2023-02-01 DIAGNOSIS — G4733 Obstructive sleep apnea (adult) (pediatric): Secondary | ICD-10-CM | POA: Diagnosis present

## 2023-02-01 LAB — HEPARIN LEVEL (UNFRACTIONATED)
Heparin Unfractionated: >1.1 IU/mL — ABNORMAL HIGH (ref 0.30–0.70)
Heparin Unfractionated: 0.75 IU/mL — ABNORMAL HIGH (ref 0.30–0.70)

## 2023-02-01 LAB — BASIC METABOLIC PANEL
Anion gap: 8 (ref 5–15)
BUN: 35 mg/dL — ABNORMAL HIGH (ref 8–23)
CO2: 34 mmol/L — ABNORMAL HIGH (ref 22–32)
Calcium: 10.4 mg/dL — ABNORMAL HIGH (ref 8.9–10.3)
Chloride: 99 mmol/L (ref 98–111)
Creatinine, Ser: 1.48 mg/dL — ABNORMAL HIGH (ref 0.44–1.00)
GFR, Estimated: 36 mL/min — ABNORMAL LOW (ref >60)
Glucose, Bld: 117 mg/dL — ABNORMAL HIGH (ref 70–99)
Potassium: 3.7 mmol/L (ref 3.5–5.1)
Sodium: 141 mmol/L (ref 135–145)

## 2023-02-01 LAB — CBC
HCT: 38.4 % (ref 36.0–46.0)
Hemoglobin: 12.3 g/dL (ref 12.0–15.0)
MCH: 30.9 pg (ref 26.0–34.0)
MCHC: 32 g/dL (ref 30.0–36.0)
MCV: 96.5 fL (ref 80.0–100.0)
Platelets: 166 10*3/uL (ref 150–400)
RBC: 3.98 MIL/uL (ref 3.87–5.11)
RDW: 14.3 % (ref 11.5–15.5)
WBC: 6.5 10*3/uL (ref 4.0–10.5)
nRBC: 0 % (ref 0.0–0.2)

## 2023-02-01 LAB — APTT: aPTT: 91 seconds — ABNORMAL HIGH (ref 24–36)

## 2023-02-01 MED ORDER — HEPARIN (PORCINE) 25000 UT/250ML-% IV SOLN
800.0000 [IU]/h | INTRAVENOUS | Status: DC
  Administered 2023-02-01: 1050 [IU]/h via INTRAVENOUS
  Filled 2023-02-01: qty 250

## 2023-02-01 NOTE — Consult Note (Signed)
ANTICOAGULATION CONSULT NOTE - Initial Consult  Pharmacy Consult for heparin infusion Indication: pulmonary embolus  Allergies  Allergen Reactions   Furosemide Other (See Comments)    Legs swelling     Patient Measurements: Height: '5\' 4"'$  (162.6 cm) Weight: 94.8 kg (208 lb 15.9 oz) IBW/kg (Calculated) : 54.7 Heparin Dosing Weight: 76.3kg   Vital Signs: Temp: 98 F (36.7 C) (02/25 0411) Temp Source: Oral (02/25 0411) BP: 113/55 (02/25 0411) Pulse Rate: 69 (02/25 0411)  Labs: Recent Labs    01/31/23 0906 01/31/23 1146 02/01/23 0347  HGB 12.6  --  12.3  HCT 40.9  --  38.4  PLT 187  --  166  HEPARINUNFRC  --   --  >1.10*  CREATININE 1.79*  --  1.48*  TROPONINIHS 14 11  --      Estimated Creatinine Clearance: 34.4 mL/min (A) (by C-G formula based on SCr of 1.48 mg/dL (H)).   Medical History: Past Medical History:  Diagnosis Date   Anemia    Cardiomyopathy ([place])    Ejection Fraction 30-35% per ECHO 2016   CHF (congestive heart failure) ([facility])    Diabetes ([place])    Dyspnea    Dysrhythmia    Hyperlipidemia    Hypertension    Peripheral neuropathy    Peripheral neuropathy    Sleep apnea     Medications:  PTA: N/A Inpatient: Heparin infusion (2/24 >>) Allergies: No AC/APT related allergies  Assessment: 82 year old female with PMH obesity, OSA not using CPAP, presents to ED with shortness of breath with desaturation to 82% on room air, which improved 94% on 2L O2. D dimer positive and VQ scan pending. Pharmacy consulted for empiric IV heparin while pending VQ scan given concern for PE.   Date Time aPTT/HL Rate/Comment  2/25 0347 HL > 1.1 Supratherapeutic  Goal of Therapy:  Heparin level 0.3-0.7 units/ml Monitor platelets by anticoagulation protocol: Yes   Plan:  Contacted RN.  Hold infusion for 1 hr. Restart heparin infusion at 1050 units/hr Will check HL in 8 hr after restart CBC daily while on heparin  Renda Rolls, PharmD, [facility] 02/01/2023 5:15  AM

## 2023-02-01 NOTE — Progress Notes (Signed)
PT Cancellation Note  Patient Details Name: Shelby [name] MRN: [id number] DOB: 05-19-1941   Cancelled Treatment:    Reason Eval/Treat Not Completed: Patient not medically ready. Pt awaiting further testing to rule out PE at this time. Heparin infusion was initiated late yesterday evening (7:23pm), which has not been 24 hours yet. Per department protocol, will hold PT for now. PT will continue to follow-up with pt acutely and await medical appropriateness prior to initiating evaluation.    [place] 02/01/2023, 11:21 AM

## 2023-02-01 NOTE — Consult Note (Signed)
ANTICOAGULATION CONSULT NOTE - Initial Consult  Pharmacy Consult for heparin infusion Indication: pulmonary embolus  Allergies  Allergen Reactions   Furosemide Other (See Comments)    Legs swelling     Patient Measurements: Height: '5\' 4"'$  (162.6 cm) Weight: 94.8 kg (208 lb 15.9 oz) IBW/kg (Calculated) : 54.7 Heparin Dosing Weight: 76.3kg   Vital Signs: Temp: 97.8 F (36.6 C) (02/25 0734) Temp Source: Oral (02/25 0734) BP: 114/60 (02/25 0734) Pulse Rate: 70 (02/25 0734)  Labs: Recent Labs    01/31/23 0906 01/31/23 1146 02/01/23 0347 02/01/23 1423  HGB 12.6  --  12.3  --   HCT 40.9  --  38.4  --   PLT 187  --  166  --   APTT  --   --   --  91*  HEPARINUNFRC  --   --  >1.10* 0.75*  CREATININE 1.79*  --  1.48*  --   TROPONINIHS 14 11  --   --      Estimated Creatinine Clearance: 34.4 mL/min (A) (by C-G formula based on SCr of 1.48 mg/dL (H)).   Medical History: Past Medical History:  Diagnosis Date   Anemia    Cardiomyopathy ([place])    Ejection Fraction 30-35% per ECHO 2016   CHF (congestive heart failure) ([facility])    Diabetes ([place])    Dyspnea    Dysrhythmia    Hyperlipidemia    Hypertension    Peripheral neuropathy    Peripheral neuropathy    Sleep apnea     Medications:  PTA: N/A Inpatient: Heparin infusion (2/24 >>) Allergies: No AC/APT related allergies  Assessment: 82 year old female with PMH obesity, OSA not using CPAP, presents to ED with shortness of breath with desaturation to 82% on room air, which improved 94% on 2L O2. D dimer positive and VQ scan pending. Pharmacy consulted for empiric IV heparin while pending VQ scan given concern for PE.   Date Time aPTT/HL Rate/Comment  2/25 0347 HL > 1.1 Supratherapeutic 2/25 1243 0.75  Supratherapeutic    Goal of Therapy:  Heparin level 0.3-0.7 units/ml Monitor platelets by anticoagulation protocol: Yes   Plan:  Heparin supatherapeutic x 1 Decrease heparin infusion to 950 units/hr Will check  aPTT in 8 hours following rate change CBC daily while on heparin  Dorothe Pea, PharmD, BCPS Clinical Pharmacist   02/01/2023 3:12 PM

## 2023-02-01 NOTE — Care Management Obs Status (Signed)
[place] NOTIFICATION   Patient Details  Name: Shelby [name] MRN: [id number] Date of Birth: 06-Feb-1941   Medicare Observation Status Notification Given:  Yes    Izola Price, RN 02/01/2023, 3:36 PM

## 2023-02-01 NOTE — Progress Notes (Signed)
OT Cancellation Note  Patient Details Name: Shelby [name] MRN: [id number] DOB: 03-Jun-1941   Cancelled Treatment:    Reason Eval/Treat Not Completed: Other (comment) (Pt waiting on further PE work up (VQ scan), of note, Heparin infusion was initiated 01/31/23 at 19:23, which has not been 24 hours yet. Per department protocol, will hold OT at this time, re-evaluate when pt is medically ready)  Shanon Payor, OTD OTR/L  02/01/23, 1:47 PM

## 2023-02-01 NOTE — Progress Notes (Signed)
PROGRESS NOTE  Tahjanae [name] [id number] DOB: 08-23-1941   PCP: Ezequiel Kayser, MD  Patient is from: Home  DOA: 01/31/2023 LOS: 0  Chief complaints Chief Complaint  Patient presents with   Weakness    Generalized      Brief Narrative / Interim history: 82 year old F with PMH of diastolic CHF, HTN, HLD, diet-controlled DM2, CKD-3B, OSA not on CPAP, obesity and possible cognitive impairment presenting with shortness of breath and hypoxemia to 82% that has improved to lower 90s on 2 L by nasal cannula and admitted for hypoxia and dyspnea.  Vital stable.  No leukocytosis.  CXR without acute finding.  COVID-19, influenza and RSV PCR nonreactive.  D-dimer elevated to 3.04.  Patient was started on IV heparin.  LE venous Doppler and VQ scan ordered.    Subjective: Seen and examined earlier this morning.  No major events overnight of this morning.  Reports feeling well this morning.  She does not recall about shortness of breath but reports some dizziness, weakness and shaking on presentation.  She feels well today.  Eager to go home.  Objective: Vitals:   01/31/23 2037 02/01/23 0411 02/01/23 0500 02/01/23 0734  BP: (!) 110/59 (!) 113/55  114/60  Pulse: 65 69  70  Resp:  18  18  Temp:  98 F (36.7 C)  97.8 F (36.6 C)  TempSrc:  Oral  Oral  SpO2: 94% 96%  95%  Weight:   94.8 kg   Height:        Examination:  GENERAL: No apparent distress.  Nontoxic. HEENT: MMM.  Vision and hearing grossly intact.  NECK: Supple.  No apparent JVD.  RESP:  No IWOB.  Fair aeration bilaterally. CVS:  RRR. Heart sounds normal.  ABD/GI/GU: BS+. Abd soft, NTND.  MSK/EXT:  Moves extremities. No apparent deformity. No edema.  SKIN: no apparent skin lesion or wound NEURO: Awake, alert and oriented appropriately.  No apparent focal neuro deficit. PSYCH: Calm. Normal affect.   Procedures:  None  Microbiology summarized: [id number], influenza and RSV PCR nonreactive.  Assessment and  plan: Principal Problem:   SOB (shortness of breath) Active Problems:   Benign essential hypertension   Hyperlipidemia   Chronic diastolic CHF (congestive heart failure) (HCC)   Chronic kidney disease, stage 3b (HCC)   Hypercalcemia   Dementia (HCC)   Obesity (BMI 30-39.9)  Hypoxemia/dyspnea: POA.  Reportedly desaturated to 82% on RA and started on 2 L by nasal cannula.  Workup including basic labs and chest x-ray without acute finding.  Appears euvolemic on exam.  D-dimer elevated.  Lower extremity venous Doppler negative.  Patient was started on IV heparin pending VQ scan.  Patient feels well today.  Denies shortness of breath or chest pain.  Currently on room air. -Continue IV heparin pending VQ scan  -Incentive spirometry, ambulatory saturation, PT/OT  Chronic diastolic CHF:: 2D echo on 07/25/2019 showed EF of 55-60%.  Patient does not have leg edema.  BNP normal 14.6.  CHF is compensated.  Appears euvolemic on exam.  Seems she has AKI on presentation. -Continue holding home torsemide -Closely monitor respiratory and fluid status  AKI on CKD-3B:: b/c Cr 1.3-1.5.  Recent Labs    01/31/23 0906 02/01/23 0347  BUN 36* 35*  CREATININE 1.79* 1.48*  -Continue holding home torsemide and Atacand -Avoid nephrotoxic meds.  Monitor   Benign essential hypertension: Normotensive. -Continue amlodipine, Coreg -Continue holding torsemide and Atacand. -Continue IV hydralazine as needed   Hyperlipidemia -Continue Lipitor  Hypercalcemia: Likely secondary to dehydration.  Improved. -Continue holding diuretics -Recheck in the morning   Dementia without behavioral disturbance: She is oriented x 4. -Continue home Aricept  Elevated D-dimer: Lower extremity venous Doppler negative. -Continue IV heparin pending VQ scan   Obesity Body mass index is 35.87 kg/m.           DVT prophylaxis:  On full dose anticoagulation  Code Status: Full code Family Communication: None at  bedside Level of care: Telemetry Medical Status is: Observation The patient remains OBS appropriate and will d/c before 2 midnights.   Final disposition: TBD Consultants:  None  35 minutes with more than 50% spent in reviewing records, counseling patient/family and coordinating care.   Sch Meds:  Scheduled Meds:  amLODipine  10 mg Oral Daily   ascorbic acid  500 mg Oral Daily   atorvastatin  80 mg Oral Daily   carvedilol  25 mg Oral BID WC   donepezil  10 mg Oral QHS   [START ON 02/02/2023] ferrous sulfate  325 mg Oral QODAY   gabapentin  300 mg Oral TID   magnesium oxide  400 mg Oral Daily   mirabegron ER  25 mg Oral Daily   multivitamin with minerals  1 tablet Oral Daily   omega-3 acid ethyl esters  1 g Oral Daily   Travoprost (BAK Free)  1 drop Both Eyes QHS   Continuous Infusions:  heparin 1,050 Units/hr (02/01/23 0633)   PRN Meds:.acetaminophen, albuterol, dextromethorphan-guaiFENesin, hydrALAZINE, ondansetron (ZOFRAN) IV  Antimicrobials: Anti-infectives (From admission, onward)    None        I have personally reviewed the following labs and images: CBC: Recent Labs  Lab 01/31/23 0906 02/01/23 0347  WBC 5.8 6.5  HGB 12.6 12.3  HCT 40.9 38.4  MCV 97.6 96.5  PLT 187 166   BMP &GFR Recent Labs  Lab 01/31/23 0906 02/01/23 0347  NA 145 141  K 4.1 3.7  CL 100 99  CO2 37* 34*  GLUCOSE 108* 117*  BUN 36* 35*  CREATININE 1.79* 1.48*  CALCIUM 10.8* 10.4*   Estimated Creatinine Clearance: 34.4 mL/min (A) (by C-G formula based on SCr of 1.48 mg/dL (H)). Liver & Pancreas: Recent Labs  Lab 01/31/23 0906  AST 38  ALT 26  ALKPHOS 52  BILITOT 0.6  PROT 7.4  ALBUMIN 4.1   No results for input(s): "LIPASE", "AMYLASE" in the last 168 hours. No results for input(s): "AMMONIA" in the last 168 hours. Diabetic: No results for input(s): "HGBA1C" in the last 72 hours. No results for input(s): "GLUCAP" in the last 168 hours. Cardiac Enzymes: No results  for input(s): "CKTOTAL", "CKMB", "CKMBINDEX", "TROPONINI" in the last 168 hours. No results for input(s): "PROBNP" in the last 8760 hours. Coagulation Profile: No results for input(s): "INR", "PROTIME" in the last 168 hours. Thyroid Function Tests: No results for input(s): "TSH", "T4TOTAL", "FREET4", "T3FREE", "THYROIDAB" in the last 72 hours. Lipid Profile: No results for input(s): "CHOL", "HDL", "LDLCALC", "TRIG", "CHOLHDL", "LDLDIRECT" in the last 72 hours. Anemia Panel: No results for input(s): "VITAMINB12", "FOLATE", "FERRITIN", "TIBC", "IRON", "RETICCTPCT" in the last 72 hours. Urine analysis:    Component Value Date/Time   COLORURINE YELLOW (A) 01/31/2023 0906   APPEARANCEUR CLEAR (A) 01/31/2023 0906   APPEARANCEUR Clear 02/16/2018 0910   LABSPEC 1.017 01/31/2023 0906   PHURINE 5.0 01/31/2023 0906   GLUCOSEU NEGATIVE 01/31/2023 0906   HGBUR NEGATIVE 01/31/2023 0906   BILIRUBINUR NEGATIVE 01/31/2023 0906   BILIRUBINUR Negative  02/16/2018 0910   KETONESUR NEGATIVE 01/31/2023 0906   PROTEINUR NEGATIVE 01/31/2023 0906   NITRITE NEGATIVE 01/31/2023 0906   LEUKOCYTESUR NEGATIVE 01/31/2023 0906   Sepsis Labs: Invalid input(s): "PROCALCITONIN", "LACTICIDVEN"  Microbiology: Recent Results (from the past 240 hour(s))  Resp panel by RT-PCR (RSV, Flu A&B, Covid) Anterior Nasal Swab     Status: None   Collection Time: 01/31/23  9:06 AM   Specimen: Anterior Nasal Swab  Result Value Ref Range Status   SARS Coronavirus 2 by RT PCR NEGATIVE NEGATIVE Final    Comment: (NOTE) SARS-CoV-2 target nucleic acids are NOT DETECTED.  The SARS-CoV-2 RNA is generally detectable in upper respiratory specimens during the acute phase of infection. The lowest concentration of SARS-CoV-2 viral copies this assay can detect is 138 copies/mL. A negative result does not preclude SARS-Cov-2 infection and should not be used as the sole basis for treatment or other patient management decisions. A negative  result may occur with  improper specimen collection/handling, submission of specimen other than nasopharyngeal swab, presence of viral mutation(s) within the areas targeted by this assay, and inadequate number of viral copies(<138 copies/mL). A negative result must be combined with clinical observations, patient history, and epidemiological information. The expected result is Negative.  Fact Sheet for Patients:  EntrepreneurPulse.com.au  Fact Sheet for Healthcare Providers:  IncredibleEmployment.be  This test is no t yet approved or cleared by the [place] FDA and  has been authorized for detection and/or diagnosis of SARS-CoV-2 by FDA under an Emergency Use Authorization (EUA). This EUA will remain  in effect (meaning this test can be used) for the duration of the COVID-19 declaration under Section 564(b)(1) of the Act, 21 U.S.C.section 360bbb-3(b)(1), unless the authorization is terminated  or revoked sooner.       Influenza A by PCR NEGATIVE NEGATIVE Final   Influenza B by PCR NEGATIVE NEGATIVE Final    Comment: (NOTE) The Xpert Xpress SARS-CoV-2/FLU/RSV plus assay is intended as an aid in the diagnosis of influenza from Nasopharyngeal swab specimens and should not be used as a sole basis for treatment. Nasal washings and aspirates are unacceptable for Xpert Xpress SARS-CoV-2/FLU/RSV testing.  Fact Sheet for Patients: EntrepreneurPulse.com.au  Fact Sheet for Healthcare Providers: IncredibleEmployment.be  This test is not yet approved or cleared by the [place] FDA and has been authorized for detection and/or diagnosis of SARS-CoV-2 by FDA under an Emergency Use Authorization (EUA). This EUA will remain in effect (meaning this test can be used) for the duration of the COVID-19 declaration under Section 564(b)(1) of the Act, 21 U.S.C. section 360bbb-3(b)(1), unless the authorization is  terminated or revoked.     Resp Syncytial Virus by PCR NEGATIVE NEGATIVE Final    Comment: (NOTE) Fact Sheet for Patients: EntrepreneurPulse.com.au  Fact Sheet for Healthcare Providers: IncredibleEmployment.be  This test is not yet approved or cleared by the [place] FDA and has been authorized for detection and/or diagnosis of SARS-CoV-2 by FDA under an Emergency Use Authorization (EUA). This EUA will remain in effect (meaning this test can be used) for the duration of the COVID-19 declaration under Section 564(b)(1) of the Act, 21 U.S.C. section 360bbb-3(b)(1), unless the authorization is terminated or revoked.  Performed at [facility], [address] [place], [place] [address]     Radiology Studies: US Venous Img Lower Bilateral (DVT)  Result Date: 01/31/2023 CLINICAL DATA:  Positive D-dimer EXAM: BILATERAL LOWER EXTREMITY VENOUS DOPPLER ULTRASOUND TECHNIQUE: Gray-scale sonography with graded compression, as well as color  Doppler and duplex ultrasound were performed to evaluate the lower extremity deep venous systems from the level of the common femoral vein and including the common femoral, femoral, profunda femoral, popliteal and calf veins including the posterior tibial, peroneal and gastrocnemius veins when visible. The superficial great saphenous vein was also interrogated. Spectral Doppler was utilized to evaluate flow at rest and with distal augmentation maneuvers in the common femoral, femoral and popliteal veins. COMPARISON:  None Available. FINDINGS: RIGHT LOWER EXTREMITY Common Femoral Vein: No evidence of thrombus. Normal compressibility, respiratory phasicity and response to augmentation. Saphenofemoral Junction: No evidence of thrombus. Normal compressibility and flow on color Doppler imaging. Profunda Femoral Vein: No evidence of thrombus. Normal compressibility and flow on color Doppler imaging. Femoral Vein: No  evidence of thrombus. Normal compressibility, respiratory phasicity and response to augmentation. Popliteal Vein: No evidence of thrombus. Normal compressibility, respiratory phasicity and response to augmentation. Calf Veins: No evidence of thrombus. Normal compressibility and flow on color Doppler imaging. Superficial Great Saphenous Vein: No evidence of thrombus. Normal compressibility. Venous Reflux:  None. Other Findings:  None. LEFT LOWER EXTREMITY Common Femoral Vein: No evidence of thrombus. Normal compressibility, respiratory phasicity and response to augmentation. Saphenofemoral Junction: No evidence of thrombus. Normal compressibility and flow on color Doppler imaging. Profunda Femoral Vein: No evidence of thrombus. Normal compressibility and flow on color Doppler imaging. Femoral Vein: No evidence of thrombus. Normal compressibility, respiratory phasicity and response to augmentation. Popliteal Vein: No evidence of thrombus. Normal compressibility, respiratory phasicity and response to augmentation. Calf Veins: No evidence of thrombus. Normal compressibility and flow on color Doppler imaging. Superficial Great Saphenous Vein: No evidence of thrombus. Normal compressibility. Venous Reflux:  None. Other Findings:  None. IMPRESSION: No evidence of deep venous thrombosis in either lower extremity. Electronically Signed   By: Jacqulynn Cadet M.D.   On: 01/31/2023 15:38      Margie Urbanowicz [address] [place]  If 7PM-7AM, please contact night-coverage [email or web address] 02/01/2023, 1:25 PM

## 2023-02-02 ENCOUNTER — Inpatient Hospital Stay: Payer: Medicare Other

## 2023-02-02 DIAGNOSIS — E785 Hyperlipidemia, unspecified: Secondary | ICD-10-CM | POA: Diagnosis not present

## 2023-02-02 DIAGNOSIS — R0602 Shortness of breath: Secondary | ICD-10-CM | POA: Diagnosis not present

## 2023-02-02 DIAGNOSIS — I5032 Chronic diastolic (congestive) heart failure: Secondary | ICD-10-CM | POA: Diagnosis not present

## 2023-02-02 DIAGNOSIS — R0902 Hypoxemia: Secondary | ICD-10-CM | POA: Diagnosis not present

## 2023-02-02 LAB — RENAL FUNCTION PANEL
Albumin: 3.4 g/dL — ABNORMAL LOW (ref 3.5–5.0)
Anion gap: 9 (ref 5–15)
BUN: 31 mg/dL — ABNORMAL HIGH (ref 8–23)
CO2: 32 mmol/L (ref 22–32)
Calcium: 10.6 mg/dL — ABNORMAL HIGH (ref 8.9–10.3)
Chloride: 98 mmol/L (ref 98–111)
Creatinine, Ser: 1.44 mg/dL — ABNORMAL HIGH (ref 0.44–1.00)
GFR, Estimated: 37 mL/min — ABNORMAL LOW (ref >60)
Glucose, Bld: 118 mg/dL — ABNORMAL HIGH (ref 70–99)
Phosphorus: 2.7 mg/dL (ref 2.5–4.6)
Potassium: 3.6 mmol/L (ref 3.5–5.1)
Sodium: 139 mmol/L (ref 135–145)

## 2023-02-02 LAB — MAGNESIUM: Magnesium: 1.8 mg/dL (ref 1.7–2.4)

## 2023-02-02 LAB — CBC
HCT: 36.4 % (ref 36.0–46.0)
Hemoglobin: 11.8 g/dL — ABNORMAL LOW (ref 12.0–15.0)
MCH: 31 pg (ref 26.0–34.0)
MCHC: 32.4 g/dL (ref 30.0–36.0)
MCV: 95.5 fL (ref 80.0–100.0)
Platelets: 167 10*3/uL (ref 150–400)
RBC: 3.81 MIL/uL — ABNORMAL LOW (ref 3.87–5.11)
RDW: 14.1 % (ref 11.5–15.5)
WBC: 5.8 10*3/uL (ref 4.0–10.5)
nRBC: 0 % (ref 0.0–0.2)

## 2023-02-02 LAB — HEPARIN LEVEL (UNFRACTIONATED)
Heparin Unfractionated: 0.63 IU/mL (ref 0.30–0.70)
Heparin Unfractionated: 0.9 IU/mL — ABNORMAL HIGH (ref 0.30–0.70)

## 2023-02-02 MED ORDER — ENOXAPARIN SODIUM 60 MG/0.6ML IJ SOSY: 0.5000 mg/kg | PREFILLED_SYRINGE | INTRAMUSCULAR | Status: DC

## 2023-02-02 MED ORDER — TECHNETIUM TO 99M ALBUMIN AGGREGATED
4.0000 | Freq: Once | INTRAVENOUS | Status: AC | PRN
  Administered 2023-02-02: 4.29 via INTRAVENOUS

## 2023-02-02 MED ORDER — TORSEMIDE 20 MG PO TABS: 20.0000 mg | ORAL_TABLET | Freq: Every day | ORAL | Status: DC

## 2023-02-02 MED ORDER — ORAL CARE MOUTH RINSE: 15.0000 mL | OROMUCOSAL | Status: DC | PRN

## 2023-02-02 NOTE — Discharge Summary (Signed)
Physician Discharge Summary  Shelby [name] [id number] DOB: [birth year] DOA: 01/31/2023  PCP: Ezequiel Kayser, MD  Admit date: 01/31/2023 Discharge date: 02/02/2023 Admitted From: Home Disposition: Home Recommendations for Outpatient Follow-up:  Follow up with PCP in 1 week Check CMP and CBC at follow-up Consider further evaluation for mild hypercalcemia if calcium remains elevated Please follow up on the following pending results: None  Home Health: PT/OT Equipment/Devices: None  Discharge Condition: Stable CODE STATUS: Full code  Follow-up Information     Ezequiel Kayser, MD Follow up on 02/10/2023.   Specialty: Internal Medicine Why: Go at 11:30am. Contact information: [place] [address] [phone]                 Hospital course 82 year old F with PMH of diastolic CHF, HTN, HLD, diet-controlled DM2, CKD-3B, OSA not on CPAP, obesity and possible cognitive impairment presenting with shortness of breath and hypoxemia to 82% that has improved to lower 90s on 2 L by nasal cannula and admitted for hypoxia and dyspnea.  Vital stable.  No leukocytosis.  CXR without acute finding.  COVID-19, influenza and RSV PCR nonreactive.  D-dimer elevated to 3.04.  Patient was started on IV heparin.  LE venous Doppler and VQ scan ordered.  Patient felt well and back to baseline the next day.  Renal function improved.  Lower extremity venous Doppler negative for DVT.  Continued on IV heparin pending VQ scan.  On the day of discharge, patient remained stable.  Family feels she is back to baseline.  VQ scan negative for PE.  See individual problem list below for more.   Problems addressed during this hospitalization Principal Problem:   SOB (shortness of breath) Active Problems:   Benign essential hypertension   Hyperlipidemia   Chronic diastolic CHF (congestive heart failure) (HCC)   Chronic kidney disease, stage 3b (HCC)    Hypercalcemia   Dementia (HCC)   Obesity (BMI 30-39.9)   Hypoxemia   AKI (acute kidney injury) ([place])   Elevated d-dimer   Hypoxemia/dyspnea: POA.  Reportedly desaturated to 82% on RA and started on 2 L by [place].  Workup including basic labs and chest x-ray without acute finding.  Appears euvolemic on exam.  D-dimer elevated.  However, lower extremity venous Doppler and VQ scan negative.  Hypoxemia and dyspnea resolved.   Chronic diastolic CHF:: 2D echo on 07/25/2019 showed EF of 55-60%.  Patient does not have leg edema.  BNP normal 14.6.  CHF is compensated.  Appears euvolemic on exam.  Seems she has AKI on presentation. -Advised to hold torsemide for 4 days.   AKI on CKD-3B:: b/l Cr 1.3-1.5.  AKI resolved. Recent Labs    01/31/23 0906 02/01/23 0347 02/02/23 0412  BUN 36* 35* 31*  CREATININE 1.79* 1.48* 1.44*  -Discontinued Atacand on discharge.  No longer taking. -Advised to hold torsemide for 4 more days. -Recheck renal function at follow-up   Benign essential hypertension: Normotensive -Continue amlodipine and Coreg. -Discontinued Atacand.  Advised to hold torsemide for 4 days.   Hyperlipidemia -Continue Lipitor   Hypercalcemia: Likely secondary to dehydration.  Improved. -Consider further workup if not back to baseline   Dementia without behavioral disturbance: She is oriented x 4. -Continue home Aricept   Elevated D-dimer: LE venous Doppler and VQ scan negative for VTE.  Generalized weakness -HH PT/OT ordered.   Obesity Body mass index is 35.84 kg/m.  Vital signs Vitals:   02/01/23 1934 02/02/23 0457 02/02/23 0500 02/02/23 0809  BP: (!) 98/57 114/63  126/64  Pulse: 64 (!) 57  68  Temp: 98.2 F (36.8 C) 98 F (36.7 C)  98.3 F (36.8 C)  Resp: '20 20  18  '$ Height:      Weight:   94.7 kg   SpO2: 92% 99%  95%  TempSrc:  Oral  Oral  BMI (Calculated):   35.82      Discharge exam  GENERAL: No apparent distress.  Nontoxic. HEENT: MMM.  Vision  and hearing grossly intact.  NECK: Supple.  No apparent JVD.  RESP:  No IWOB.  Fair aeration bilaterally. CVS:  RRR. Heart sounds normal.  ABD/GI/GU: BS+. Abd soft, NTND.  MSK/EXT:  Moves extremities. No apparent deformity. No edema.  SKIN: no apparent skin lesion or wound NEURO: Awake and alert. Oriented x 4.  No apparent focal neuro deficit. PSYCH: Calm. Normal affect.   Discharge Instructions Discharge Instructions     Diet - low sodium heart healthy   Complete by: As directed    Discharge instructions   Complete by: As directed    It has been a pleasure taking care of you!  You were hospitalized shortness of breath, dizziness or lightheadedness likely from dehydration and some degree of acute kidney injury.  Your imaging did not show lung infection or blood clot.  Your kidney function improved with IV fluid.  Will recommend holding your torsemide for 4 days.  Please review your new medication list and the directions on your medications before you take them.  Follow-up with your primary care doctor in 1 to 2 weeks or sooner if needed.   Take care,   Increase activity slowly   Complete by: As directed       Allergies as of 02/02/2023       Reactions   Furosemide Other (See Comments)   Legs swelling        Medication List     STOP taking these medications    candesartan 32 MG tablet Commonly known as: ATACAND   fesoterodine 8 MG Tb24 tablet Commonly known as: TOVIAZ   meloxicam 7.5 MG tablet Commonly known as: Mobic       TAKE these medications    amLODipine 5 MG tablet Commonly known as: NORVASC Take 10 mg by mouth daily.   ascorbic acid 500 MG tablet Commonly known as: VITAMIN C Take 500 mg by mouth daily.   aspirin EC 81 MG tablet Take 81 mg by mouth daily.   atorvastatin 80 MG tablet Commonly known as: LIPITOR Take 80 mg by mouth daily.   brimonidine 0.2 % ophthalmic solution Commonly known as: ALPHAGAN Place 1 drop into both eyes 2 (two)  times daily.   carvedilol 25 MG tablet Commonly known as: COREG Take by mouth 2 (two) times daily with a meal.   donepezil 10 MG tablet Commonly known as: ARICEPT Take 10 mg by mouth at bedtime.   ferrous sulfate 325 (65 FE) MG tablet Take by mouth. Takes one pill every 2-3 days to minimize constipation   Fish Oil 1000 MG Caps Take 1,000 mg by mouth daily.   fluticasone 50 MCG/ACT nasal spray Commonly known as: FLONASE Place 1 spray into both nostrils 2 (two) times daily.   gabapentin 300 MG capsule Commonly known as: NEURONTIN Take 300 mg by mouth 3 (three) times daily.   magnesium oxide 400 MG tablet Commonly known as: MAG-OX Take  400 mg by mouth daily.   mirabegron ER 50 MG Tb24 tablet Commonly known as: MYRBETRIQ Take 1 tablet (50 mg total) by mouth daily. What changed: how much to take   Multi-Vitamins Tabs Take 1 tablet by mouth daily.   torsemide 20 MG tablet Commonly known as: DEMADEX Take 1 tablet (20 mg total) by mouth daily. Start taking on: February 05, 2023 What changed: These instructions start on February 05, 2023. If you are unsure what to do until then, ask your doctor or other care provider.   travoprost (benzalkonium) 0.004 % ophthalmic solution Commonly known as: TRAVATAN Place 1 drop into both eyes at bedtime.        Consultations: None  Procedures/Studies:   NM Pulmonary Perfusion  Result Date: 02/02/2023 CLINICAL DATA:  Elevated D-dimer, recent trauma, question pulmonary embolism EXAM: NUCLEAR MEDICINE PERFUSION LUNG SCAN TECHNIQUE: Perfusion images were obtained in multiple projections after intravenous injection of radiopharmaceutical. Ventilation scans intentionally deferred if perfusion scan and chest x-ray adequate for interpretation during COVID 19 epidemic. RADIOPHARMACEUTICALS:  4.29 mCi Tc-57mMAA IV COMPARISON:  None Radiographic correlation: Chest radiograph 02/02/2023 FINDINGS: Eventration RIGHT diaphragm. Otherwise normal  perfusion lung scan. No perfusion defects. IMPRESSION: Pulmonary embolism absent. Electronically Signed   By: MLavonia DanaM.D.   On: 02/02/2023 10:23   DG Chest 1 View  Result Date: 02/02/2023 CLINICAL DATA:  Shortness of breath EXAM: CHEST  1 VIEW COMPARISON:  01/31/2023 FINDINGS: The patient has not taken a deep inspiration. Heart size is unchanged. Aortic atherosclerosis and tortuosity as seen previously. The upper lungs remain clear. Slightly increased markings at the lung bases that could be due to a poor inspiration or slight basilar atelectasis. IMPRESSION: Slightly increased markings at the lung bases that could be due to a poor inspiration or slight basilar atelectasis. Electronically Signed   By: MNelson ChimesM.D.   On: 02/02/2023 09:03   UKoreaVenous Img Lower Bilateral (DVT)  Result Date: 01/31/2023 CLINICAL DATA:  Positive D-dimer EXAM: BILATERAL LOWER EXTREMITY VENOUS DOPPLER ULTRASOUND TECHNIQUE: Gray-scale sonography with graded compression, as well as color Doppler and duplex ultrasound were performed to evaluate the lower extremity deep venous systems from the level of the common femoral vein and including the common femoral, femoral, profunda femoral, popliteal and calf veins including the posterior tibial, peroneal and gastrocnemius veins when visible. The superficial great saphenous vein was also interrogated. Spectral Doppler was utilized to evaluate flow at rest and with distal augmentation maneuvers in the common femoral, femoral and popliteal veins. COMPARISON:  None Available. FINDINGS: RIGHT LOWER EXTREMITY Common Femoral Vein: No evidence of thrombus. Normal compressibility, respiratory phasicity and response to augmentation. Saphenofemoral Junction: No evidence of thrombus. Normal compressibility and flow on color Doppler imaging. Profunda Femoral Vein: No evidence of thrombus. Normal compressibility and flow on color Doppler imaging. Femoral Vein: No evidence of thrombus. Normal  compressibility, respiratory phasicity and response to augmentation. Popliteal Vein: No evidence of thrombus. Normal compressibility, respiratory phasicity and response to augmentation. Calf Veins: No evidence of thrombus. Normal compressibility and flow on color Doppler imaging. Superficial Great Saphenous Vein: No evidence of thrombus. Normal compressibility. Venous Reflux:  None. Other Findings:  None. LEFT LOWER EXTREMITY Common Femoral Vein: No evidence of thrombus. Normal compressibility, respiratory phasicity and response to augmentation. Saphenofemoral Junction: No evidence of thrombus. Normal compressibility and flow on color Doppler imaging. Profunda Femoral Vein: No evidence of thrombus. Normal compressibility and flow on color Doppler imaging. Femoral Vein: No evidence of thrombus.  Normal compressibility, respiratory phasicity and response to augmentation. Popliteal Vein: No evidence of thrombus. Normal compressibility, respiratory phasicity and response to augmentation. Calf Veins: No evidence of thrombus. Normal compressibility and flow on color Doppler imaging. Superficial Great Saphenous Vein: No evidence of thrombus. Normal compressibility. Venous Reflux:  None. Other Findings:  None. IMPRESSION: No evidence of deep venous thrombosis in either lower extremity. Electronically Signed   By: Jacqulynn Cadet M.D.   On: 01/31/2023 15:38   DG Hip Unilat W or Wo Pelvis 2-3 Views Left  Result Date: 01/31/2023 CLINICAL DATA:  LEFT hip pain after injury. EXAM: DG HIP (WITH OR WITHOUT PELVIS) 2-3V LEFT COMPARISON:  None Available. FINDINGS: Single-view of the pelvis and two views of the LEFT hip are provided. Osseous alignment is normal. Bone mineralization is normal. No fracture line or displaced fracture fragment is seen. No significant degenerative change at the LEFT hip. Soft tissues about the pelvis and LEFT hip are unremarkable. Degenerative spondylosis is seen within the lower lumbar spine, at  least moderate in degree, incompletely imaged. IMPRESSION: 1. No acute findings. No osseous fracture or dislocation. 2. 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 Electronically Signed   By: Franki Cabot M.D.   On: 01/31/2023 10:47   DG Chest 2 View  Result Date: 01/31/2023 CLINICAL DATA:  Shortness of breath, weakness. EXAM: CHEST - 2 VIEW COMPARISON:  Chest x-ray dated 01/15/2019 FINDINGS: Heart size and mediastinal contours are stable. Lungs are clear. No pleural effusion or pneumothorax is seen. Stable chronic elevation of the RIGHT hemidiaphragm. Degenerative spondylosis of the mid thoracic spine, mild to moderate in degree. Osseous structures about the chest are otherwise unremarkable. IMPRESSION: No active cardiopulmonary disease. No evidence of pneumonia or pulmonary edema. Electronically Signed   By: Franki Cabot M.D.   On: 01/31/2023 09:19       The results of significant diagnostics from this hospitalization (including imaging, microbiology, ancillary and laboratory) are listed below for reference.     Microbiology: Recent Results (from the past 240 hour(s))  Resp panel by RT-PCR (RSV, Flu A&B, Covid) Anterior Nasal Swab     Status: None   Collection Time: 01/31/23  9:06 AM   Specimen: Anterior Nasal Swab  Result Value Ref Range Status   SARS Coronavirus 2 by RT PCR NEGATIVE NEGATIVE Final    Comment: (NOTE) SARS-CoV-2 target nucleic acids are NOT DETECTED.  The SARS-CoV-2 RNA is generally detectable in upper respiratory specimens during the acute phase of infection. The lowest concentration of SARS-CoV-2 viral copies this assay can detect is 138 copies/mL. A negative result does not preclude SARS-Cov-2 infection and should not be used as the sole basis for treatment or other patient management decisions. A negative result may occur with  improper specimen collection/handling, submission of specimen other than  nasopharyngeal swab, presence of viral mutation(s) within the areas targeted by this assay, and inadequate number of viral copies(<138 copies/mL). A negative result must be combined with clinical observations, patient history, and epidemiological information. The expected result is Negative.  Fact Sheet for Patients:  EntrepreneurPulse.com.au  Fact Sheet for Healthcare Providers:  IncredibleEmployment.be  This test is no t yet approved or cleared by the [place] FDA and  has been authorized for detection and/or diagnosis of SARS-CoV-2 by FDA under an Emergency Use Authorization (EUA). This EUA will remain  in effect (meaning this test can be used) for the duration of the COVID-19 declaration under Section 564(b)(1)  of the Act, 21 U.S.C.section 360bbb-3(b)(1), unless the authorization is terminated  or revoked sooner.       Influenza A by PCR NEGATIVE NEGATIVE Final   Influenza B by PCR NEGATIVE NEGATIVE Final    Comment: (NOTE) The Xpert Xpress SARS-CoV-2/FLU/RSV plus assay is intended as an aid in the diagnosis of influenza from Nasopharyngeal swab specimens and should not be used as a sole basis for treatment. Nasal washings and aspirates are unacceptable for Xpert Xpress SARS-CoV-2/FLU/RSV testing.  Fact Sheet for Patients: EntrepreneurPulse.com.au  Fact Sheet for Healthcare Providers: IncredibleEmployment.be  This test is not yet approved or cleared by the [place] FDA and has been authorized for detection and/or diagnosis of SARS-CoV-2 by FDA under an Emergency Use Authorization (EUA). This EUA will remain in effect (meaning this test can be used) for the duration of the COVID-19 declaration under Section 564(b)(1) of the Act, 21 U.S.C. section 360bbb-3(b)(1), unless the authorization is terminated or revoked.     Resp Syncytial Virus by PCR NEGATIVE NEGATIVE Final    Comment:  (NOTE) Fact Sheet for Patients: EntrepreneurPulse.com.au  Fact Sheet for Healthcare Providers: IncredibleEmployment.be  This test is not yet approved or cleared by the [place] FDA and has been authorized for detection and/or diagnosis of SARS-CoV-2 by FDA under an Emergency Use Authorization (EUA). This EUA will remain in effect (meaning this test can be used) for the duration of the COVID-19 declaration under Section 564(b)(1) of the Act, 21 U.S.C. section 360bbb-3(b)(1), unless the authorization is terminated or revoked.  Performed at [facility], [address] [place], [place] [address]      Labs:  CBC: Recent Labs  Lab 01/31/23 [phone] 02/01/23 0347 02/02/23 0412  WBC 5.8 6.5 5.8  HGB 12.6 12.3 11.8*  HCT 40.9 38.4 36.4  MCV 97.6 96.5 95.5  PLT 187 166 167   BMP &GFR Recent Labs  Lab 01/31/23 0906 02/01/23 0347 02/02/23 0412  NA 145 141 139  K 4.1 3.7 3.6  CL 100 99 98  CO2 37* 34* 32  GLUCOSE 108* 117* 118*  BUN 36* 35* 31*  CREATININE 1.79* 1.48* 1.44*  CALCIUM 10.8* 10.4* 10.6*  MG  --   --  1.8  PHOS  --   --  2.7   Estimated Creatinine Clearance: 35.4 mL/min (A) (by C-G formula based on SCr of 1.44 mg/dL (H)). Liver & Pancreas: Recent Labs  Lab 01/31/23 0906 02/02/23 0412  AST 38  --   ALT 26  --   ALKPHOS 52  --   BILITOT 0.6  --   PROT 7.4  --   ALBUMIN 4.1 3.4*   No results for input(s): "LIPASE", "AMYLASE" in the last 168 hours. No results for input(s): "AMMONIA" in the last 168 hours. Diabetic: No results for input(s): "HGBA1C" in the last 72 hours. No results for input(s): "GLUCAP" in the last 168 hours. Cardiac Enzymes: No results for input(s): "CKTOTAL", "CKMB", "CKMBINDEX", "TROPONINI" in the last 168 hours. No results for input(s): "PROBNP" in the last 8760 hours. Coagulation Profile: No results for input(s): "INR", "PROTIME" in the last 168 hours. Thyroid Function  Tests: No results for input(s): "TSH", "T4TOTAL", "FREET4", "T3FREE", "THYROIDAB" in the last 72 hours. Lipid Profile: No results for input(s): "CHOL", "HDL", "LDLCALC", "TRIG", "CHOLHDL", "LDLDIRECT" in the last 72 hours. Anemia Panel: No results for input(s): "VITAMINB12", "FOLATE", "FERRITIN", "TIBC", "IRON", "RETICCTPCT" in the last 72 hours. Urine analysis:    Component Value Date/Time   COLORURINE YELLOW (  A) 01/31/2023 0906   APPEARANCEUR CLEAR (A) 01/31/2023 0906   APPEARANCEUR Clear 02/16/2018 0910   LABSPEC 1.017 01/31/2023 0906   PHURINE 5.0 01/31/2023 0906   GLUCOSEU NEGATIVE 01/31/2023 0906   HGBUR NEGATIVE 01/31/2023 0906   BILIRUBINUR NEGATIVE 01/31/2023 0906   BILIRUBINUR Negative 02/16/2018 0910   KETONESUR NEGATIVE 01/31/2023 0906   PROTEINUR NEGATIVE 01/31/2023 0906   NITRITE NEGATIVE 01/31/2023 0906   LEUKOCYTESUR NEGATIVE 01/31/2023 0906   Sepsis Labs: Invalid input(s): "PROCALCITONIN", "LACTICIDVEN"   SIGNED:  Mercy Riding, MD  [facility] 02/02/2023, 3:02 PM

## 2023-02-02 NOTE — TOC CM/SW Note (Addendum)
Patient has orders to discharge home today. Nurse secretary will print off [facility] list to go home with patient. No further concerns. CSW signing off.  Dayton Scrape, [place]  2:25 pm: CSW tried calling patient before she left but line was busy. Patient left before CSW could talk with her about home health. Tried calling her cell phone. No answer and voicemail not set up.  Dayton Scrape, [place]

## 2023-02-02 NOTE — Evaluation (Signed)
Occupational Therapy Evaluation Patient Details Name: Shelby [name] MRN: [id number] DOB: 04-12-1941 Today's Date: 02/02/2023   History of Present Illness Pt is a 82 y/o F admitted with shortness of breath and hypoxia. Medical hx of DM2, CHF, HTN, OSA, possible cognitive impairment.   Clinical Impression   Patient received for OT evaluation. See flowsheet below for details of function. Generally, patient requiring SBA for bed mobility, SBA with QC for functional mobility, and SBA for ADLs. Patient will benefit from continued OT while in acute care.       Recommendations for follow up therapy are one component of a multi-disciplinary discharge planning process, led by the attending physician.  Recommendations may be updated based on patient status, additional functional criteria and insurance authorization.   Follow Up Recommendations  Home health OT     Assistance Recommended at Discharge Intermittent Supervision/Assistance  Patient can return home with the following A little help with walking and/or transfers;A little help with bathing/dressing/bathroom;Assistance with cooking/housework;Direct supervision/assist for medications management;Direct supervision/assist for financial management;Assist for transportation;Help with stairs or ramp for entrance    Functional Status Assessment  Patient has had a recent decline in their functional status and demonstrates the ability to make significant improvements in function in a reasonable and predictable amount of time.  Equipment Recommendations  None recommended by OT    Recommendations for Other Services       Precautions / Restrictions Precautions Precautions: Fall Restrictions Weight Bearing Restrictions: No      Mobility Bed Mobility               General bed mobility comments: Pt received seated EOB    Transfers Overall transfer level: Modified independent Equipment used: Quad cane                General transfer comment: Able to t/f to standing independently.      Balance                                           ADL either performed or assessed with clinical judgement   ADL Overall ADL's : Needs assistance/impaired                                       General ADL Comments: Pt will benefit from CGA for mobility at home (on heparin drip currently and therefore at risk for bleeding if falls; given hx of fall would benefit from increased hands-on for safety). Able to perform figure four position; able to mobilize to bathroom; did remember her cane when walking out of bathroom (cues to turn and look for it after pt said "where's my cane", as it was behind her); cues while standing at sink for hand hygiene, as pt still had soap on her R hand when she went to dry her hands. Education provided to family on fall risk. Unable to complete dressing ADL today (although pt pending d/c today) given that pt still has IV running.     Vision         Perception     Praxis      Pertinent Vitals/Pain Pain Assessment Pain Assessment: No/denies pain     Hand Dominance     Extremity/Trunk Assessment Upper Extremity Assessment Upper Extremity Assessment: Overall WFL for tasks assessed  Lower Extremity Assessment Lower Extremity Assessment: Defer to PT evaluation       Communication Communication Communication: No difficulties   Cognition Arousal/Alertness: Awake/alert Behavior During Therapy: WFL for tasks assessed/performed Overall Cognitive Status: History of cognitive impairments - at baseline                                 General Comments: Medical record states that pt has questionable cognitive impairment. Family in room not very forthcoming, but nodding in agreement when OT asks if pt is at her cognitive baseline. Pt is able to name the day of the week, the month, the fact that it is the end of the month, and the year;  unable to name the date.     General Comments  Pt walked to bathroom with SBA; a bit discoordinated with use of QC. OT managing IV pole. Pt and family very focused on d/c home today.    Exercises     Shoulder Instructions      Home Living Family/patient expects to be discharged to:: Private residence Living Arrangements: Other relatives (pt is caregiver for dependent sister) Available Help at Discharge: Family (pt states that her neice can assist; neice in room and not saying much this date, but does say "yes" when OT asks if she can be present at d/c to help prevent falls (pt on Heparin drip currently)) Type of Home: House Home Access: Stairs to enter [employer] of Steps: 2         Bathroom Shower/Tub: Occupational psychologist: Handicapped height     Home Equipment: [employer] - quad;Shower seat;Toilet riser;Grab bars - toilet          Prior Functioning/Environment Prior Level of Function : Independent/Modified Independent             Mobility Comments: uses quad cane; states that her last fall was two weeks ago, she fell on her L hip ADLs Comments: States she is (I) with ADLs. Pt states that she gets Meals on Wheels. Family assists with groceries (pt stating she "doesn't drive much"; family not correcting); typically takes trash out, does laundry, etc.        OT Problem List: Impaired balance (sitting and/or standing);Decreased safety awareness      OT Treatment/Interventions: Self-care/ADL training;Therapeutic exercise;DME and/or AE instruction;Therapeutic activities    OT Goals(Current goals can be found in the care plan section) Acute Rehab OT Goals Patient Stated Goal: Go home OT Goal Formulation: With patient/family Time For Goal Achievement: 02/16/23 Potential to Achieve Goals: Good ADL Goals Pt Will Perform Lower Body Dressing: with modified independence;sit to/from stand  OT Frequency: Min 2X/week    Co-evaluation               AM-PAC OT "6 Clicks" Daily Activity     Outcome Measure Help from another person eating meals?: None Help from another person taking care of personal grooming?: None Help from another person toileting, which includes using toliet, bedpan, or urinal?: None Help from another person bathing (including washing, rinsing, drying)?: A Little Help from another person to put on and taking off regular upper body clothing?: None Help from another person to put on and taking off regular lower body clothing?: A Little 6 Click Score: 22   End of Session Equipment Utilized During Treatment: Other (comment) (quad cane) Nurse Communication: Mobility status;Other (comment) (readiness for d/c.)  Activity Tolerance: Patient tolerated  treatment well Patient left: Other (comment);with bed alarm set;with call bell/phone within reach;with family/visitor present (seated EOB)  OT Visit Diagnosis: Unsteadiness on feet (R26.81)                Time: [id number] OT Time Calculation (min): 17 min Charges:  OT General Charges $OT Visit: 1 Visit OT Evaluation $OT Eval Moderate Complexity: [place], MS, OTR/L   Vania Rea 02/02/2023, 1:51 PM

## 2023-02-02 NOTE — Plan of Care (Signed)
Shelby [name] Shelby [name]

## 2023-02-02 NOTE — Consult Note (Signed)
ANTICOAGULATION CONSULT NOTE  Pharmacy Consult for heparin infusion Indication: pulmonary embolus  Allergies  Allergen Reactions   Furosemide Other (See Comments)    Legs swelling     Patient Measurements: Height: '5\' 4"'$  (162.6 cm) Weight: 94.8 kg (208 lb 15.9 oz) IBW/kg (Calculated) : 54.7 Heparin Dosing Weight: 76.3kg   Vital Signs: Temp: 98.2 F (36.8 C) (02/25 1934) Temp Source: Oral (02/25 1537) BP: 98/57 (02/25 1934) Pulse Rate: 64 (02/25 1934)  Labs: Recent Labs    01/31/23 0906 01/31/23 1146 02/01/23 0347 02/01/23 1423 02/01/23 2354  HGB 12.6  --  12.3  --   --   HCT 40.9  --  38.4  --   --   PLT 187  --  166  --   --   APTT  --   --   --  91*  --   HEPARINUNFRC  --   --  >1.10* 0.75* 0.90*  CREATININE 1.79*  --  1.48*  --   --   TROPONINIHS 14 11  --   --   --      Estimated Creatinine Clearance: 34.4 mL/min (A) (by C-G formula based on SCr of 1.48 mg/dL (H)).   Medical History: Past Medical History:  Diagnosis Date   Anemia    Cardiomyopathy ([place])    Ejection Fraction 30-35% per ECHO 2016   CHF (congestive heart failure) (HCC)    Diabetes ([place])    Dyspnea    Dysrhythmia    Hyperlipidemia    Hypertension    Peripheral neuropathy    Peripheral neuropathy    Sleep apnea     Medications:  PTA: N/A Inpatient: Heparin infusion (2/24 >>) Allergies: No AC/APT related allergies  Assessment: 82 year old female with PMH obesity, OSA not using CPAP, presents to ED with shortness of breath with desaturation to 82% on room air, which improved 94% on 2L O2. D dimer positive and VQ scan pending. Pharmacy consulted for empiric IV heparin while pending VQ scan given concern for PE.   Date Time aPTT/HL Rate/Comment  2/25 0347 HL > 1.1 Supratherapeutic 2/25 1243 0.75  Supratherapeutic   2/25 2354 0.90  Supratherapeutic  Goal of Therapy:  Heparin level 0.3-0.7 units/ml Monitor platelets by anticoagulation protocol: Yes   Plan:  Decrease heparin  infusion to 800 units/hr Will check aPTT in 8 hours following rate change CBC daily while on heparin  Renda Rolls, PharmD, [facility] 02/02/2023 1:02 AM

## 2023-02-02 NOTE — Progress Notes (Signed)
Patient discharge education completed with patient and family. IV's removed without complications. Personal home meds recovered from pharmacy and received by patient. All belongings gathered. Patient transported to the exit via wheel chair accompanied by staff/volunteer. Patient discharged to the care of the family in stable condition.

## 2023-02-02 NOTE — Evaluation (Signed)
Physical Therapy Evaluation Patient Details Name: Shelby [name] MRN: [id number] DOB: [birth year] Today's Date: 02/02/2023  History of Present Illness  Pt is a 82 y.o. female presenting to hospital 01/31/23 with c/o generalized weakness and occasionally SOB; s/p fall yesterday hitting L hip on couch (did not fall to ground).  Noted to be hypoxic in ED.  Pt admitted with hypoxia and dyspnea.  PMH includes CHF, DM, anemia, HLD, anemia, peripheral neuropathy, sleep apnea, lymphedema.  Clinical Impression  Prior to hospital admission, pt was modified independent ambulating with quad cane; lives with (and is caretaker for) her dependent sister.  No c/o pain during session.  Currently pt is modified independent with transfers and CGA with ambulation 200 feet with quad cane use.  No loss of balance noted during sessions activities.  Pt was noted with generalized weakness and decreased activity tolerance compared to baseline (pt requesting HHPT).  Pt would currently benefit from skilled PT to address noted impairments and functional limitations (see below for any additional details).  Upon hospital discharge, pt would benefit from ongoing therapy.    Recommendations for follow up therapy are one component of a multi-disciplinary discharge planning process, led by the attending physician.  Recommendations may be updated based on patient status, additional functional criteria and insurance authorization.  Follow Up Recommendations Home health PT      Assistance Recommended at Discharge PRN  Patient can return home with the following  A little help with walking and/or transfers;A little help with bathing/dressing/bathroom;Assistance with cooking/housework;Assist for transportation;Help with stairs or ramp for entrance    Equipment Recommendations Other (comment) (pt has own quad cane)  Recommendations for Other Services       Functional Status Assessment Patient has had a recent decline in their  functional status and demonstrates the ability to make significant improvements in function in a reasonable and predictable amount of time.     Precautions / Restrictions Precautions Precautions: Fall Restrictions Weight Bearing Restrictions: No      Mobility  Bed Mobility               General bed mobility comments: Deferred (pt sitting on edge of bed beginning/end of session)    Transfers Overall transfer level: Modified independent Equipment used: Quad cane               General transfer comment: fairly strong transfer from bed x2 trials and from toilet x1 trial    Ambulation/Gait Ambulation/Gait assistance: Min guard Gait Distance (Feet): 200 Feet Assistive device: Quad cane   Gait velocity: decreased     General Gait Details: steady step through gait pattern  Stairs Stairs:  (Deferred d/t pt on heparin drip)          Wheelchair Mobility    Modified Rankin (Stroke Patients Only)       Balance Overall balance assessment: Needs assistance Sitting-balance support: No upper extremity supported, Feet supported Sitting balance-Leahy Scale: Normal Sitting balance - Comments: steady sitting reaching outside BOS   Standing balance support: Single extremity supported, During functional activity, Reliant on assistive device for balance Standing balance-Leahy Scale: Good Standing balance comment: steady ambulating with quad cane                             Pertinent Vitals/Pain Pain Assessment Pain Assessment: No/denies pain Vitals (HR and O2 on room air) stable and WFL throughout treatment session.    Home Living Family/patient expects to  be discharged to:: Private residence Living Arrangements: Other relatives (Pt is caregiver for her dependent sister) Available Help at Discharge: Family;Available PRN/intermittently Type of Home: House Home Access: Stairs to enter Entrance Stairs-Rails: Right Entrance Stairs-Number of Steps: 2    Home Layout: One level Home Equipment: Cane - quad;Shower seat;Toilet riser;Grab bars - toilet;Grab bars - tub/shower      Prior Function Prior Level of Function : Independent/Modified Independent             Mobility Comments: Ambulates with quad cane.  Recent fall onto L hip. ADLs Comments: Per OT eval "States she is (I) with ADLs. Pt states that she gets Meals on Wheels. Family assists with groceries (pt stating she "doesn't drive much"; family not correcting); typically takes trash out, does Medical sales representative, etc."     Hand Dominance        Extremity/Trunk Assessment   Upper Extremity Assessment Upper Extremity Assessment: Overall WFL for tasks assessed    Lower Extremity Assessment Lower Extremity Assessment: Generalized weakness    Cervical / Trunk Assessment Cervical / Trunk Assessment: Normal  Communication   Communication: No difficulties  Cognition Arousal/Alertness: Awake/alert Behavior During Therapy: WFL for tasks assessed/performed Overall Cognitive Status: History of cognitive impairments - at baseline                                 General Comments: Pt oriented to person, month, year, place, and general situation.        General Comments General comments (skin integrity, edema, etc.): Pt's son, niece, and nieces daughter present part or all of session.    Exercises     Assessment/Plan    PT Assessment Patient needs continued PT services  PT Problem List Decreased strength;Decreased activity tolerance;Decreased balance;Decreased mobility       PT Treatment Interventions DME instruction;Gait training;Stair training;Functional mobility training;Therapeutic activities;Therapeutic exercise;Balance training;Patient/family education    PT Goals (Current goals can be found in the Care Plan section)  Acute Rehab PT Goals Patient Stated Goal: to go home today PT Goal Formulation: With patient Time For Goal Achievement: 02/16/23 Potential to  Achieve Goals: Good    Frequency Min 2X/week     Co-evaluation               AM-PAC PT "6 Clicks" Mobility  Outcome Measure Help needed turning from your back to your side while in a flat bed without using bedrails?: None Help needed moving from lying on your back to sitting on the side of a flat bed without using bedrails?: A Little Help needed moving to and from a bed to a chair (including a wheelchair)?: A Little Help needed standing up from a chair using your arms (e.g., wheelchair or bedside chair)?: A Little Help needed to walk in hospital room?: A Little Help needed climbing 3-5 steps with a railing? : A Little 6 Click Score: 19    End of Session Equipment Utilized During Treatment: Gait belt Activity Tolerance: Patient tolerated treatment well Patient left: with call bell/phone within reach;with bed alarm set;with family/visitor present (sitting on edge of bed eating lunch) Nurse Communication: Mobility status;Precautions PT Visit Diagnosis: Muscle weakness (generalized) (M62.81);History of falling (Z91.81)    Time: [id number] PT Time Calculation (min) (ACUTE ONLY): 23 min   Charges:   PT Evaluation $PT Eval Low Complexity: 1 Low PT Treatments $Therapeutic Activity: 8-22 mins       Leitha Bleak, PT 02/02/23,  4:09 PM

## 2023-02-02 NOTE — Consult Note (Signed)
ANTICOAGULATION CONSULT NOTE  Pharmacy Consult for Heparin Infusion Indication: pulmonary embolus  Patient Measurements: Height: '5\' 4"'$  (162.6 cm) Weight: 94.7 kg (208 lb 12.4 oz) IBW/kg (Calculated) : 54.7 Heparin Dosing Weight: 76.3kg   Labs: Recent Labs    01/31/23 0906 01/31/23 0906 01/31/23 1146 02/01/23 0347 02/01/23 1423 02/01/23 2354 02/02/23 0412 02/02/23 1020  HGB 12.6  --   --  12.3  --   --  11.8*  --   HCT 40.9  --   --  38.4  --   --  36.4  --   PLT 187  --   --  166  --   --  167  --   APTT  --   --   --   --  91*  --   --   --   HEPARINUNFRC  --    < >  --  >1.10* 0.75* 0.90*  --  0.63  CREATININE 1.79*  --   --  1.48*  --   --  1.44*  --   TROPONINIHS 14  --  11  --   --   --   --   --    < > = values in this interval not displayed.    Estimated Creatinine Clearance: 35.4 mL/min (A) (by C-G formula based on SCr of 1.44 mg/dL (H)).  Medical History: Past Medical History:  Diagnosis Date   Anemia    Cardiomyopathy ([place])    Ejection Fraction 30-35% per ECHO 2016   CHF (congestive heart failure) (HCC)    Diabetes ([place])    Dyspnea    Dysrhythmia    Hyperlipidemia    Hypertension    Peripheral neuropathy    Peripheral neuropathy    Sleep apnea     Medications:  PTA: N/A Inpatient: Heparin infusion (2/24 >>) Allergies: No AC/APT related allergies  Assessment: 82 year old female with PMH obesity, OSA not using CPAP, presents to ED with shortness of breath with desaturation to 82% on room air, which improved 94% on 2L O2. D dimer positive and VQ scan pending. Pharmacy consulted for empiric IV heparin while pending VQ scan given concern for PE.   VQ scan negative  Date Time aPTT/HL Rate/Comment  2/25 0347 HL > 1.1 Suprathera 2/25 1243 0.75  Suprathera 2/25 2354 0.90  Suprathera 2/26 1020 0.63  Thera x 1  Goal of Therapy:  Heparin level 0.3-0.7 units/ml Monitor platelets by anticoagulation protocol: Yes   Plan:  --Heparin level is  therapeutic x 1 --Continue heparin infusion at 800 units/hr --Re-check confirmatory HL in 8 hours --Daily CBC per protocol while on IV heparin --Follow-up anticoagulation given VQ scan results  Benita Gutter 02/02/2023 11:18 AM

## 2023-02-05 DIAGNOSIS — R531 Weakness: Secondary | ICD-10-CM | POA: Insufficient documentation

## 2023-03-30 DIAGNOSIS — N3281 Overactive bladder: Secondary | ICD-10-CM | POA: Insufficient documentation

## 2023-06-16 ENCOUNTER — Encounter: Payer: Medicare Other | Admitting: Family

## 2023-06-16 ENCOUNTER — Telehealth: Payer: Self-pay | Admitting: Family

## 2023-06-16 NOTE — Progress Notes (Deleted)
Advanced Heart Failure Clinic Note   Referring Physician: PCP: Mickey Farber, MD PCP-Cardiologist: None   HPI:  Shelby [name] is an 82 y/o female with a history of DM, hyperlipidemia, HTN, anemia, obstructive sleep apnea, neuropathy and chronic heart failure.   Echo 07/25/2019: EF of 55-60%. Echo 02/13/15: EF of 35% along with moderate MR and mild PHTN.   Function study on 03/16/15 showed an EF of 52%.   Has not been admitted or been in the ED in the last 6 months.      She presents today for an initial HF visit (was last seen >3 years ago) with a chief complaint of      Review of Systems: [y] = yes, [ ]  = no   General: Weight gain [ ] ; Weight loss [ ] ; Anorexia [ ] ; Fatigue [ ] ; Fever [ ] ; Chills [ ] ; Weakness [ ]   Cardiac: Chest pain/pressure [ ] ; Resting SOB [ ] ; Exertional SOB [ ] ; Orthopnea [ ] ; Pedal Edema [ ] ; Palpitations [ ] ; Syncope [ ] ; Presyncope [ ] ; Paroxysmal nocturnal dyspnea[ ]   Pulmonary: Cough [ ] ; Wheezing[ ] ; Hemoptysis[ ] ; Sputum [ ] ; Snoring [ ]   GI: Vomiting[ ] ; Dysphagia[ ] ; Melena[ ] ; Hematochezia [ ] ; Heartburn[ ] ; Abdominal pain [ ] ; Constipation [ ] ; Diarrhea [ ] ; BRBPR [ ]   GU: Hematuria[ ] ; Dysuria [ ] ; Nocturia[ ]   Vascular: Pain in legs with walking [ ] ; Pain in feet with lying flat [ ] ; Non-healing sores [ ] ; Stroke [ ] ; TIA [ ] ; Slurred speech [ ] ;  Neuro: Headaches[ ] ; Vertigo[ ] ; Seizures[ ] ; Paresthesias[ ] ;Blurred vision [ ] ; Diplopia [ ] ; Vision changes [ ]   Ortho/Skin: Arthritis [ ] ; Joint pain [ ] ; Muscle pain [ ] ; Joint swelling [ ] ; Back Pain [ ] ; Rash [ ]   Psych: Depression[ ] ; Anxiety[ ]   Heme: Bleeding problems [ ] ; Clotting disorders [ ] ; Anemia [ ]   Endocrine: Diabetes [ ] ; Thyroid dysfunction[ ]    Past Medical History:  Diagnosis Date   Anemia    Cardiomyopathy (HCC)    Ejection Fraction 30-35% per ECHO 2016   CHF (congestive heart failure) (HCC)    Diabetes (HCC)    Dyspnea    Dysrhythmia    Hyperlipidemia    Hypertension     Peripheral neuropathy    Peripheral neuropathy    Sleep apnea     Current Outpatient Medications  Medication Sig Dispense Refill   amLODipine (NORVASC) 5 MG tablet Take 10 mg by mouth daily.      ascorbic acid (VITAMIN C) 500 MG tablet Take 500 mg by mouth daily.     aspirin EC 81 MG tablet Take 81 mg by mouth daily. (Patient not taking: Reported on 01/31/2023)     atorvastatin (LIPITOR) 80 MG tablet Take 80 mg by mouth daily.      brimonidine (ALPHAGAN) 0.2 % ophthalmic solution Place 1 drop into both eyes 2 (two) times daily.  (Patient not taking: Reported on 01/31/2023)     carvedilol (COREG) 25 MG tablet Take by mouth 2 (two) times daily with a meal.      donepezil (ARICEPT) 10 MG tablet Take 10 mg by mouth at bedtime.     ferrous sulfate 325 (65 FE) MG tablet Take by mouth. Takes one pill every 2-3 days to minimize constipation     fluticasone (FLONASE) 50 MCG/ACT nasal spray Place 1 spray into both nostrils 2 (two) times daily. (Patient not taking: Reported on 01/31/2023)  18 g 0   gabapentin (NEURONTIN) 300 MG capsule Take 300 mg by mouth 3 (three) times daily.     magnesium oxide (MAG-OX) 400 MG tablet Take 400 mg by mouth daily.      mirabegron ER (MYRBETRIQ) 50 MG TB24 tablet Take 1 tablet (50 mg total) by mouth daily. (Patient taking differently: Take 25 mg by mouth daily.) 90 tablet 3   Multiple Vitamin (MULTI-VITAMINS) TABS Take 1 tablet by mouth daily.      Omega-3 Fatty Acids (FISH OIL) 1000 MG CAPS Take 1,000 mg by mouth daily.     torsemide (DEMADEX) 20 MG tablet Take 1 tablet (20 mg total) by mouth daily.     travoprost, benzalkonium, (TRAVATAN) 0.004 % ophthalmic solution Place 1 drop into both eyes at bedtime.      No current facility-administered medications for this visit.    Allergies  Allergen Reactions   Furosemide Other (See Comments)    Legs swelling       Social History   Socioeconomic History   Marital status: Widowed    Spouse name: Not on file    Number of children: Not on file   Years of education: Not on file   Highest education level: Not on file  Occupational History   Not on file  Tobacco Use   Smoking status: Never   Smokeless tobacco: Never  Vaping Use   Vaping Use: Never used  Substance and Sexual Activity   Alcohol use: No   Drug use: No   Sexual activity: Not Currently    Birth control/protection: Post-menopausal  Other Topics Concern   Not on file  Social History Narrative   Not on file   Social Determinants of Health   Financial Resource Strain: Low Risk  (03/08/2018)   Overall Financial Resource Strain (CARDIA)    Difficulty of Paying Living Expenses: Not very hard  Food Insecurity: No Food Insecurity (01/31/2023)   Hunger Vital Sign    Worried About Running Out of Food in the Last Year: Never true    Ran Out of Food in the Last Year: Never true  Transportation Needs: No Transportation Needs (01/31/2023)   PRAPARE - Administrator, Civil Service (Medical): No    Lack of Transportation (Non-Medical): No  Physical Activity: Inactive (03/08/2018)   Exercise Vital Sign    Days of Exercise per Week: 0 days    Minutes of Exercise per Session: 0 min  Stress: No Stress Concern Present (03/08/2018)   [employer] of Occupational Health - Occupational Stress Questionnaire    Feeling of Stress : Not at all  Social Connections: Moderately Integrated (03/08/2018)   Social Connection and Isolation Panel [NHANES]    Frequency of Communication with Friends and Family: More than three times a week    Frequency of Social Gatherings with Friends and Family: Once a week    Attends Religious Services: More than 4 times per year    Active Member of [employer] or Organizations: Yes    Attends Banker Meetings: More than 4 times per year    Marital Status: Widowed  Intimate Partner Violence: Not At Risk (01/31/2023)   Humiliation, Afraid, Rape, and Kick questionnaire    Fear of Current or Ex-Partner: No     Emotionally Abused: No    Physically Abused: No    Sexually Abused: No      Family History  Problem Relation Age of Onset   Diabetes Mother  Heart disease Mother    Bladder Cancer Neg Hx    Kidney cancer Neg Hx       PHYSICAL EXAM: General:  Well appearing. No respiratory difficulty HEENT: normal Neck: supple. no JVD. Carotids 2+ bilat; no bruits. No lymphadenopathy or thyromegaly appreciated. Cor: PMI nondisplaced. Regular rate & rhythm. No rubs, gallops or murmurs. Lungs: clear Abdomen: soft, nontender, nondistended. No hepatosplenomegaly. No bruits or masses. Good bowel sounds. Extremities: no cyanosis, clubbing, rash, edema Neuro: alert & oriented x 3, cranial nerves grossly intact. moves all 4 extremities w/o difficulty. Affect pleasant.  ECG:   ASSESSMENT & PLAN:  1: Chronic heart failure with now preserved ejection fraction- - NYHA class II - euvolemic today - not weighing daily; encouraged her to resume daily weighing so that she can call for an overnight weight gain of >2 pounds or a weekly weight gain of >5 pounds - weight up 3 pounds from last visit 6 months ago - trying to not add salt; reviewed the importance of closely following a 2000mg  sodium diet - drinks ~32 ounces of water daily along with a a soda or tea - saw cardiology (Fath) 06/09/18  - BNP 11/19/18 was 25.0 - wearing her compression boots as needed now - PharmD reconciled medications with the patient - she reports receiving both her COVID vaccines  2: HTN- - BP looks good today - saw PCP Harrington Challenger) 03/01/20 - BMP from 02/02/20 reviewed and showed potassium 4.2, creatinine 1.3 and GFR 48  3: Diabetes- - fasting glucose in clinic this morning was 95 - A1c on 06/15/2019 was 6.4%  4: Obstructive sleep apnea- - sleep study was July 2019 - saw pulmonology Welton Flakes) 10/27/2019 - continues to sleep well wearing her CPAP   Delma Freeze, FNP 06/16/23

## 2023-06-16 NOTE — Telephone Encounter (Signed)
Patient did not show for her Heart Failure Clinic appointment on 06/16/23.

## 2023-06-19 ENCOUNTER — Other Ambulatory Visit: Payer: Self-pay | Admitting: Internal Medicine

## 2023-06-19 DIAGNOSIS — R079 Chest pain, unspecified: Secondary | ICD-10-CM

## 2023-07-01 ENCOUNTER — Observation Stay
Admission: EM | Admit: 2023-07-01 | Discharge: 2023-07-01 | Discharge status: Against Medical Advice | Payer: Medicare Other | Attending: Internal Medicine | Admitting: Internal Medicine

## 2023-07-01 ENCOUNTER — Emergency Department: Payer: Medicare Other

## 2023-07-01 ENCOUNTER — Other Ambulatory Visit: Payer: Self-pay

## 2023-07-01 DIAGNOSIS — N179 Acute kidney failure, unspecified: Secondary | ICD-10-CM | POA: Insufficient documentation

## 2023-07-01 DIAGNOSIS — Z79899 Other long term (current) drug therapy: Secondary | ICD-10-CM | POA: Insufficient documentation

## 2023-07-01 DIAGNOSIS — I130 Hypertensive heart and chronic kidney disease with heart failure and stage 1 through stage 4 chronic kidney disease, or unspecified chronic kidney disease: Secondary | ICD-10-CM | POA: Diagnosis not present

## 2023-07-01 DIAGNOSIS — E669 Obesity, unspecified: Secondary | ICD-10-CM | POA: Diagnosis not present

## 2023-07-01 DIAGNOSIS — R569 Unspecified convulsions: Principal | ICD-10-CM

## 2023-07-01 DIAGNOSIS — E785 Hyperlipidemia, unspecified: Secondary | ICD-10-CM | POA: Diagnosis present

## 2023-07-01 DIAGNOSIS — I5032 Chronic diastolic (congestive) heart failure: Secondary | ICD-10-CM | POA: Diagnosis not present

## 2023-07-01 DIAGNOSIS — I959 Hypotension, unspecified: Secondary | ICD-10-CM | POA: Diagnosis not present

## 2023-07-01 DIAGNOSIS — Z7982 Long term (current) use of aspirin: Secondary | ICD-10-CM | POA: Insufficient documentation

## 2023-07-01 DIAGNOSIS — E1159 Type 2 diabetes mellitus with other circulatory complications: Secondary | ICD-10-CM | POA: Diagnosis present

## 2023-07-01 DIAGNOSIS — N1832 Chronic kidney disease, stage 3b: Secondary | ICD-10-CM | POA: Diagnosis not present

## 2023-07-01 DIAGNOSIS — F0390 Unspecified dementia without behavioral disturbance: Secondary | ICD-10-CM | POA: Insufficient documentation

## 2023-07-01 DIAGNOSIS — Z6836 Body mass index (BMI) 36.0-36.9, adult: Secondary | ICD-10-CM | POA: Insufficient documentation

## 2023-07-01 DIAGNOSIS — E1122 Type 2 diabetes mellitus with diabetic chronic kidney disease: Secondary | ICD-10-CM | POA: Diagnosis not present

## 2023-07-01 DIAGNOSIS — N183 Chronic kidney disease, stage 3 unspecified: Secondary | ICD-10-CM | POA: Diagnosis present

## 2023-07-01 DIAGNOSIS — R4020 Unspecified coma: Secondary | ICD-10-CM

## 2023-07-01 DIAGNOSIS — R55 Syncope and collapse: Principal | ICD-10-CM | POA: Insufficient documentation

## 2023-07-01 DIAGNOSIS — E1129 Type 2 diabetes mellitus with other diabetic kidney complication: Secondary | ICD-10-CM | POA: Diagnosis present

## 2023-07-01 LAB — BASIC METABOLIC PANEL
Anion gap: 9 (ref 5–15)
BUN: 34 mg/dL — ABNORMAL HIGH (ref 8–23)
CO2: 29 mmol/L (ref 22–32)
Calcium: 10.8 mg/dL — ABNORMAL HIGH (ref 8.9–10.3)
Chloride: 100 mmol/L (ref 98–111)
Creatinine, Ser: 2.3 mg/dL — ABNORMAL HIGH (ref 0.44–1.00)
GFR, Estimated: 21 mL/min — ABNORMAL LOW (ref >60)
Glucose, Bld: 135 mg/dL — ABNORMAL HIGH (ref 70–99)
Potassium: 3.7 mmol/L (ref 3.5–5.1)
Sodium: 138 mmol/L (ref 135–145)

## 2023-07-01 LAB — CBC
HCT: 41.9 % (ref 36.0–46.0)
Hemoglobin: 13.2 g/dL (ref 12.0–15.0)
MCH: 29.5 pg (ref 26.0–34.0)
MCHC: 31.5 g/dL (ref 30.0–36.0)
MCV: 93.5 fL (ref 80.0–100.0)
Platelets: 177 10*3/uL (ref 150–400)
RBC: 4.48 MIL/uL (ref 3.87–5.11)
RDW: 15.1 % (ref 11.5–15.5)
WBC: 5.6 10*3/uL (ref 4.0–10.5)
nRBC: 0 % (ref 0.0–0.2)

## 2023-07-01 LAB — BRAIN NATRIURETIC PEPTIDE: B Natriuretic Peptide: 11.9 pg/mL (ref 0.0–100.0)

## 2023-07-01 LAB — D-DIMER, QUANTITATIVE: D-Dimer, Quant: 3.22 ug/mL-FEU — ABNORMAL HIGH (ref 0.00–0.50)

## 2023-07-01 LAB — TROPONIN I (HIGH SENSITIVITY)
Troponin I (High Sensitivity): 13 ng/L (ref <18)
Troponin I (High Sensitivity): 11 ng/L (ref <18)

## 2023-07-01 LAB — MAGNESIUM: Magnesium: 1.7 mg/dL (ref 1.7–2.4)

## 2023-07-01 MED ORDER — ONDANSETRON HCL 4 MG/2ML IJ SOLN: 4.0000 mg | Freq: Three times a day (TID) | INTRAMUSCULAR | Status: DC | PRN

## 2023-07-01 MED ORDER — ALBUTEROL SULFATE (2.5 MG/3ML) 0.083% IN NEBU: 3.0000 mL | INHALATION_SOLUTION | RESPIRATORY_TRACT | Status: DC | PRN

## 2023-07-01 MED ORDER — LORAZEPAM 2 MG/ML IJ SOLN: 2.0000 mg | INTRAMUSCULAR | Status: DC | PRN

## 2023-07-01 MED ORDER — LACTATED RINGERS IV BOLUS
1000.0000 mL | Freq: Once | INTRAVENOUS | Status: AC
  Administered 2023-07-01: 1000 mL via INTRAVENOUS

## 2023-07-01 MED ORDER — ACETAMINOPHEN 325 MG PO TABS: 650.0000 mg | ORAL_TABLET | Freq: Four times a day (QID) | ORAL | Status: DC | PRN

## 2023-07-01 MED ORDER — SODIUM CHLORIDE 0.9 % IV SOLN: INTRAVENOUS | Status: DC

## 2023-07-01 MED ORDER — ENOXAPARIN SODIUM 30 MG/0.3ML IJ SOSY: 30.0000 mg | PREFILLED_SYRINGE | INTRAMUSCULAR | Status: DC

## 2023-07-01 MED ORDER — DM-GUAIFENESIN ER 30-600 MG PO TB12: 1.0000 | ORAL_TABLET | Freq: Two times a day (BID) | ORAL | Status: DC | PRN

## 2023-07-01 NOTE — ED Provider Notes (Signed)
[facility] Provider Note    Event Date/Time   First MD Initiated Contact with Patient 07/01/23 [phone]     (approximate)   History   Chief Complaint Seizures  HPI  Shelby [name] is a 82 y.o. female with past medical history of hypertension, hyperlipidemia, diabetes, CKD, and CHF who presents to the ED following seizure.  Patient states that she was on her way to the hospital for routine echocardiogram for follow-up of her known CHF.  On the way to the hospital, she began feeling lightheaded and weak.  Her niece dropped her off and she states that the symptoms worsened as she attempted to walk into the hospital.  She reportedly had a witnessed seizure-like episode by staff at [facility] clinic, at which point she fell to the ground and was brought to the ED for further evaluation.  Staff at [facility] describe diffuse shaking, patient arousable to sternal rub as she was brought to the ED.  Patient now more awake and alert, denies any chest pain or shortness of breath and denies any seizure history.  She denies any tongue biting or urinary incontinence with the episode.     Physical Exam   Triage Vital Signs: ED Triage Vitals  Encounter Vitals Group     BP 07/01/23 0924 (!) 82/43     Systolic BP Percentile --      Diastolic BP Percentile --      Pulse Rate 07/01/23 0922 65     Resp 07/01/23 0922 20     Temp 07/01/23 0924 98.1 F (36.7 C)     Temp Source 07/01/23 0922 Oral     SpO2 --      Weight 07/01/23 0924 185 lb (83.9 kg)     Height 07/01/23 0924 5' (1.524 m)     Head Circumference --      Peak Flow --      Pain Score --      Pain Loc --      Pain Education --      Exclude from Growth Chart --     Most recent vital signs: Vitals:   07/01/23 0941 07/01/23 0950  BP:  117/81  Pulse:  (!) 58  Resp:  12  Temp:    SpO2: 98% 99%    Constitutional: Somnolent but arousable to voice. Eyes: Conjunctivae are normal.  Pupils equal, round, and  reactive to light bilaterally. Head: Atraumatic. Nose: No congestion/rhinnorhea. Mouth/Throat: Mucous membranes are moist.  Cardiovascular: Normal rate, regular rhythm. Grossly normal heart sounds.  2+ radial pulses bilaterally. Respiratory: Normal respiratory effort.  No retractions. Lungs CTAB. Gastrointestinal: Soft and nontender. No distention. Musculoskeletal: No lower extremity tenderness nor edema.  Neurologic:  Normal speech and language. No gross focal neurologic deficits are appreciated.    ED Results / Procedures / Treatments   Labs (all labs ordered are listed, but only abnormal results are displayed) Labs Reviewed  BASIC METABOLIC PANEL - Abnormal; Notable for the following components:      Result Value   Glucose, Bld 135 (*)    BUN 34 (*)    Creatinine, Ser 2.30 (*)    Calcium 10.8 (*)    GFR, Estimated 21 (*)    All other components within normal limits  CBC  MAGNESIUM  TROPONIN I (HIGH SENSITIVITY)  TROPONIN I (HIGH SENSITIVITY)     EKG  ED ECG REPORT I, Chesley Noon, the attending physician, personally viewed and interpreted this ECG.  Date: 07/01/2023  EKG Time: 9:19  Rate: 56  Rhythm: sinus bradycardia  Axis: LAD  Intervals:none  ST&T Change: None  RADIOLOGY CT head reviewed and interpreted by me with no hemorrhage or midline shift.  PROCEDURES:  Critical Care performed: No  Procedures   MEDICATIONS ORDERED IN ED: Medications  lactated ringers bolus 1,000 mL (1,000 mLs Intravenous New Bag/Given 07/01/23 0939)     IMPRESSION / MDM / ASSESSMENT AND PLAN / ED COURSE  I reviewed the triage vital signs and the nursing notes.                              82 y.o. female with past medical history of hypertension, hyperlipidemia, diabetes, CKD, and CHF who presents to the ED following lightheadedness with episode of loss of consciousness while coming to the hospital for echocardiogram, seizure activity reported by witnesses.  Patient's  presentation is most consistent with acute presentation with potential threat to life or bodily function.  Differential diagnosis includes, but is not limited to, seizure, syncope, arrhythmia, ACS, PE, anemia, electrode abnormality, AKI, vasovagal episode, orthostatic hypotension.  On my assessment, patient is somnolent but easily arousable, reports ongoing lightheadedness and fatigue but denies any chest pain or shortness of breath currently.  Seizure activity described by witnesses, while seizure is possible it seems more likely that patient had a syncopal episode.  We will check CT head, EKG shows sinus bradycardia with no ischemic changes.  Patient with hypotension we will hydrate with IV fluids, labs and chest x-ray pending at this time.  Given seizure seems less likely, will hold off on any anticonvulsant medication.  CT head and chest x-ray are unremarkable, labs show AKI with no acute electrolyte abnormality.  No significant anemia or leukocytosis noted, troponin within normal limits.  Blood pressure now improved following IV fluids, case discussed with hospitalist for admission for further syncope/seizure workup.      FINAL CLINICAL IMPRESSION(S) / ED DIAGNOSES   Final diagnoses:  Seizure-like activity (HCC)  Loss of consciousness (HCC)  AKI (acute kidney injury) (HCC)     Rx / DC Orders   ED Discharge Orders     None        Note:  This document was prepared using Dragon voice recognition software and may include unintentional dictation errors.   Chesley Noon, MD 07/01/23 1101

## 2023-07-01 NOTE — Discharge Summary (Signed)
Physician Discharge Summary  Shelby [name] WUJ:[id number] DOB: March 30, 1941 DOA: 07/01/2023  PCP: Shelby Farber, MD  Admit date: 07/01/2023 Discharge date: 07/01/2023  Recommendations for Outpatient Follow-up:  None since pt left on AMA  Home Health: none Equipment/Devices: none  Discharge Condition:  CODE STATUS: full Diet recommendation: heart/carb diet  Brief/Interim Summary (HPI)  HPI: Shelby [name] is a 82 y.o. female with medical history significant of hypertension, hyperlipidemia, diabetes mellitus, diastolic CHF, dementia, CKD-3B, anemia, obesity, who presents with unresponsiveness.   Pt states that she was on her way to the hospital for routine echocardiogram for follow-up of her known CHF, and began feeling lightheaded and weak. Her niece states that the symptoms worsened as she attempted to walk into the hospital. She reportedly had seizure-like episode and unresponsiveness, described as whole body shaking in [facility]. Pt  fell to the ground and was brought to the ED for further evaluation. Per report, patient was arousable to sternal rub. When I saw pt in ED, pt is alert and orientated x 3.  Patient denies unilateral numbness or tinglings in extremities.  No facial droop or slurred speech.  Patient denies chest pain, cough, shortness of breath.  No nausea, vomiting, diarrhea or abdominal pain.  No symptoms of UTI.   Patient was found to have oxygen desaturation to 87% on room air, initially started on 2 L oxygen with 99% of saturation, then improved to 93-95% on room air later on when I saw patient in ED.  Blood pressure 88/60 initially, which improved to 117/81 after giving 1 L bolus of LR.   Data reviewed independently and ED Course: pt was found to have WBC 5.6, worsening renal function, calcium 10.8, temperature normal, heart rate 58, RR 20.  Chest x-ray showed right hemidiaphragm elevation without infiltration.  CT head negative.  Patient is placed on  head of bed for observation.     EKG: I have personally reviewed.  Sinus rhythm, QTc 441, LAD, incomplete right bundle blockage   Discharge Diagnoses and Hospital Course:   Principal Problem:   Syncope Active Problems:   Hypotension   Acute renal failure superimposed on stage 3b chronic kidney disease (HCC)   Hyperlipidemia   Chronic diastolic CHF (congestive heart failure) (HCC)   Chronic kidney disease, stage 3b (HCC)   Type II diabetes mellitus with renal manifestations (HCC)   HLD (hyperlipidemia)   Hypercalcemia   Dementia (HCC)   Obesity (BMI 30-39.9)   Syncope: Etiology is not clear.  CT head negative.  No focal neurodeficit on physical examination.  Patient had an episode of oxygen desaturation and hypotension, making PE is potential differential diagnosis.  Other differential diagnosis include vasovagal syncope, seizure, TIA/stroke, orthostatic status.  Plan was to get D-dimer, if it is positive, will do VQ scan to rule out PE.  Patient D-dimer came back +3.22.  Unfortunately patient left the hospital on AMA per nurse report.  I did not have chance to talk to patient. I also ordered EEG and 2D echo.   - Placed on tele for obs - Orthostatic vital signs  - EEG --> not done - 2d echo --> nod done  - Neuro checks  - IVF: 1L of LR, NS 75 cc/h   Hypotension -plan to hold BP and give IVF   Acute renal failure superimposed on stage 3b chronic kidney disease (HCC): Baseline creatinine 1.2 on 02/10/2023.  Her creatinine is 2.30, BUN 34, GFR 21 -IVF -Plan to hold diuretics   Hyperlipidemia -  Plan to continue her home Lipitor   Chronic diastolic CHF (congestive heart failure) (HCC): 2D echo 07/25/2019 showed EF of 55-60%.  Patient does not have leg edema or JVD.  Clinically dry. -Plan to hold torsemide   Chronic kidney disease, stage 3b [facility]): Patient has worsening renal function.  Baseline creatinine 1.2 on 02/10/2023.  Her creatinine is at 2.30, BUN 34, GFR 21 -Plan was to  treat with IV fluid and hold diuretics   Type II diabetes mellitus with renal manifestations [facility]): Patient seems to be not on medications.  Recent A1c 5.9, well-controlled. -Check CBG every morning   HLD (hyperlipidemia) -Plan to continue Lipitor   Hypercalcemia: Calcium 10.8, this is chronic issue. -f/u with BMP -on IVF   Dementia (HCC) -Continue donepezil   Obesity (BMI 30-39.9): Body weight 83.9 kg, BMI 36.13 -Encouraged losing weight -Exercise and healthy diet        Discharge Instructions   Allergies as of 07/01/2023       Reactions   Furosemide Other (See Comments)   Legs swelling        Medication List     ASK your doctor about these medications    amLODipine 5 MG tablet Commonly known as: NORVASC Take 10 mg by mouth daily.   ascorbic acid 500 MG tablet Commonly known as: VITAMIN C Take 500 mg by mouth daily.   aspirin EC 81 MG tablet Take 81 mg by mouth daily.   atorvastatin 80 MG tablet Commonly known as: LIPITOR Take 80 mg by mouth daily.   brimonidine 0.2 % ophthalmic solution Commonly known as: ALPHAGAN Place 1 drop into both eyes 2 (two) times daily.   carvedilol 25 MG tablet Commonly known as: COREG Take by mouth 2 (two) times daily with a meal.   donepezil 10 MG tablet Commonly known as: ARICEPT Take 10 mg by mouth at bedtime.   ferrous sulfate 325 (65 FE) MG tablet Take by mouth. Takes one pill every 2-3 days to minimize constipation   Fish Oil 1000 MG Caps Take 1,000 mg by mouth daily.   fluticasone 50 MCG/ACT nasal spray Commonly known as: FLONASE Place 1 spray into both nostrils 2 (two) times daily.   gabapentin 300 MG capsule Commonly known as: NEURONTIN Take 300 mg by mouth 3 (three) times daily.   magnesium oxide 400 MG tablet Commonly known as: MAG-OX Take 400 mg by mouth daily.   mirabegron ER 50 MG Tb24 tablet Commonly known as: MYRBETRIQ Take 1 tablet (50 mg total) by mouth daily.   Multi-Vitamins  Tabs Take 1 tablet by mouth daily.   torsemide 20 MG tablet Commonly known as: DEMADEX Take 1 tablet (20 mg total) by mouth daily.   travoprost (benzalkonium) 0.004 % ophthalmic solution Commonly known as: TRAVATAN Place 1 drop into both eyes at bedtime.   Travatan Z 0.004 % Soln ophthalmic solution Generic drug: Travoprost (BAK Free) Place 1 drop into both eyes 2 (two) times daily.        Allergies  Allergen Reactions   Furosemide Other (See Comments)    Legs swelling     Consultations: none   Procedures/Studies: CT Head Wo Contrast  Result Date: 07/01/2023 CLINICAL DATA:  82 year old female status post witnessed seizure when presenting for scheduled echocardiogram. EXAM: CT HEAD WITHOUT CONTRAST TECHNIQUE: Contiguous axial images were obtained from the base of the skull through the vertex without intravenous contrast. RADIATION DOSE REDUCTION: This exam was performed according to the departmental dose-optimization program which includes  automated exposure control, adjustment of the mA and/or kV according to patient size and/or use of iterative reconstruction technique. COMPARISON:  Head CT 03/04/2021. FINDINGS: Brain: Cerebral volume is not significantly changed since 2022. No midline shift, ventriculomegaly, mass effect, evidence of mass lesion, intracranial hemorrhage or evidence of cortically based acute infarction. Patchy bilateral white matter hypodensity, including some deep white matter capsule involvement, has not significantly changed. No cortical encephalomalacia identified. Vascular: No suspicious intracranial vascular hyperdensity. Calcified atherosclerosis at the skull base. Skull: Intact.  No acute osseous abnormality identified. Sinuses/Orbits: Visualized paranasal sinuses and mastoids are stable and well aerated. Other: Visualized orbits and scalp soft tissues are within normal limits. IMPRESSION: 1. No acute intracranial abnormality or acute traumatic injury  identified. 2. Stable non contrast CT appearance of chronic white matter disease. Electronically Signed   By: Odessa Fleming M.D.   On: 07/01/2023 10:35   DG Chest Portable 1 View  Result Date: 07/01/2023 CLINICAL DATA:  Syncope EXAM: PORTABLE CHEST 1 VIEW COMPARISON:  Chest radiograph 01/13/2023 FINDINGS: The cardiomediastinal silhouette is stable. There is unchanged asymmetric elevation of the right hemidiaphragm. There is no focal consolidation or pulmonary edema. There is no pleural effusion or pneumothorax There is no acute osseous abnormality. IMPRESSION: Unchanged asymmetric elevation of the right hemidiaphragm. No radiographic evidence of acute cardiopulmonary process. Electronically Signed   By: Lesia Hausen M.D.   On: 07/01/2023 10:17      Discharge Exam: Vitals:   07/01/23 0941 07/01/23 0950  BP:  117/81  Pulse:  (!) 58  Resp:  12  Temp:    SpO2: 98% 99%   Vitals:   07/01/23 0924 07/01/23 0938 07/01/23 0941 07/01/23 0950  BP: (!) 82/43   117/81  Pulse:    (!) 58  Resp:    12  Temp: 98.1 F (36.7 C)     TempSrc: Oral     SpO2:  (!) 87% 98% 99%  Weight: 83.9 kg     Height: 5' (1.524 m)      Did not have chance to do physical examination.     The results of significant diagnostics from this hospitalization (including imaging, microbiology, ancillary and laboratory) are listed below for reference.     Microbiology: No results found for this or any previous visit (from the past 240 hour(s)).   Labs: BNP (last 3 results) Recent Labs    01/31/23 0906 07/01/23 0930  BNP 14.6 11.9   Basic Metabolic Panel: Recent Labs  Lab 07/01/23 0930  NA 138  K 3.7  CL 100  CO2 29  GLUCOSE 135*  BUN 34*  CREATININE 2.30*  CALCIUM 10.8*  MG 1.7   Liver Function Tests: No results for input(s): "AST", "ALT", "ALKPHOS", "BILITOT", "PROT", "ALBUMIN" in the last 168 hours. No results for input(s): "LIPASE", "AMYLASE" in the last 168 hours. No results for input(s): "AMMONIA" in  the last 168 hours. CBC: Recent Labs  Lab 07/01/23 0930  WBC 5.6  HGB 13.2  HCT 41.9  MCV 93.5  PLT 177   Cardiac Enzymes: No results for input(s): "CKTOTAL", "CKMB", "CKMBINDEX", "TROPONINI" in the last 168 hours. BNP: Invalid input(s): "POCBNP" CBG: No results for input(s): "GLUCAP" in the last 168 hours. D-Dimer Recent Labs    07/01/23 1135 07/01/23 1238  DDIMER SPECIMEN HEMOLYZED. HEMOLYSIS MAY AFFECT INTEGRITY OF RESULTS. 3.22*   Hgb A1c No results for input(s): "HGBA1C" in the last 72 hours. Lipid Profile No results for input(s): "CHOL", "HDL", "LDLCALC", "TRIG", "CHOLHDL", "LDLDIRECT"  in the last 72 hours. Thyroid function studies No results for input(s): "TSH", "T4TOTAL", "T3FREE", "THYROIDAB" in the last 72 hours.  Invalid input(s): "FREET3" Anemia work up No results for input(s): "VITAMINB12", "FOLATE", "FERRITIN", "TIBC", "IRON", "RETICCTPCT" in the last 72 hours. Urinalysis    Component Value Date/Time   COLORURINE YELLOW (A) 01/31/2023 0906   APPEARANCEUR CLEAR (A) 01/31/2023 0906   APPEARANCEUR Clear 02/16/2018 0910   LABSPEC 1.017 01/31/2023 0906   PHURINE 5.0 01/31/2023 0906   GLUCOSEU NEGATIVE 01/31/2023 0906   HGBUR NEGATIVE 01/31/2023 0906   BILIRUBINUR NEGATIVE 01/31/2023 0906   BILIRUBINUR Negative 02/16/2018 0910   KETONESUR NEGATIVE 01/31/2023 0906   PROTEINUR NEGATIVE 01/31/2023 0906   NITRITE NEGATIVE 01/31/2023 0906   LEUKOCYTESUR NEGATIVE 01/31/2023 0906   Sepsis Labs Recent Labs  Lab 07/01/23 0930  WBC 5.6   Microbiology No results found for this or any previous visit (from the past 240 hour(s)).  Time coordinating discharge:  20 minutes.   SIGNED:  Lorretta Harp, MD [facility] 07/01/2023, 7:46 PM   If 7PM-7AM, please contact night-coverage [email or web address]

## 2023-07-01 NOTE — H&P (Signed)
History and Physical    Shelby [name] QIO:[id number] DOB: 03-22-1941 DOA: 07/01/2023  Referring MD/NP/PA:   PCP: Mickey Farber, MD   Patient coming from:  The patient is coming from home.     Chief Complaint: Shelby [name]  HPI: Shelby [name] is a 82 y.o. female with medical history significant of hypertension, hyperlipidemia, diabetes mellitus, diastolic CHF, dementia, CKD-3B, anemia, obesity, who presents with unresponsiveness.  Pt states that she was on her way to the hospital for routine echocardiogram for follow-up of her known CHF, and began feeling lightheaded and weak. Her niece states that the symptoms worsened as she attempted to walk into the hospital. She reportedly had seizure-like episode and unresponsiveness, described as whole body shaking in [place] clinic. Pt  fell to the ground and was brought to the ED for further evaluation. Per report, patient was arousable to sternal rub. When I saw pt in ED, pt is alert and orientated x 3.  Patient denies unilateral numbness or tinglings in extremities.  No facial droop or slurred speech.  Patient denies chest pain, cough, shortness of breath.  No nausea, vomiting, diarrhea or abdominal pain.  No symptoms of UTI.  Patient was found to have oxygen desaturation to 87% on room air, initially started on 2 L oxygen with 99% of saturation, then improved to 93-95% on room air later on when I saw patient in ED.  Blood pressure 88/60 initially, which improved to 117/81 after giving 1 L bolus of LR.  Data reviewed independently and ED Course: pt was found to have WBC 5.6, worsening renal function, calcium 10.8, temperature normal, heart rate 58, RR 20.  Chest x-ray showed right hemidiaphragm elevation without infiltration.  CT head negative.  Patient is placed on head of bed for observation.   EKG: I have personally reviewed.  Sinus rhythm, QTc 441, LAD, incomplete right bundle blockage   Review of Systems:   General: no  fevers, chills, no body weight gain, has fatigue HEENT: no blurry vision, hearing changes or sore throat Respiratory: no dyspnea, coughing, wheezing CV: no chest pain, no palpitations GI: no nausea, vomiting, abdominal pain, diarrhea, constipation GU: no dysuria, burning on urination, increased urinary frequency, hematuria  Ext: no leg edema Neuro: no unilateral weakness, numbness, or tingling, no vision change or hearing loss. Had unresponsiveness and body shaking Skin: no rash, no skin tear. MSK: No muscle spasm, no deformity, no limitation of range of movement in spin Heme: No easy bruising.  Travel history: No recent long distant travel.   Allergy:  Allergies  Allergen Reactions   Furosemide Other (See Comments)    Legs swelling     Past Medical History:  Diagnosis Date   Anemia    Cardiomyopathy (HCC)    Ejection Fraction 30-35% per ECHO 2016   CHF (congestive heart failure) (HCC)    Diabetes (HCC)    Dyspnea    Dysrhythmia    Hyperlipidemia    Hypertension    Peripheral neuropathy    Peripheral neuropathy    Sleep apnea     Past Surgical History:  Procedure Laterality Date   ABDOMINAL HYSTERECTOMY     COLONOSCOPY     COLONOSCOPY N/A 04/01/2018   Procedure: COLONOSCOPY;  Surgeon: Christena Deem, MD;  Location: [facility] ENDOSCOPY;  Service: Endoscopy;  Laterality: N/A;   COLONOSCOPY WITH PROPOFOL N/A 12/29/2017   Procedure: COLONOSCOPY WITH PROPOFOL;  Surgeon: Christena Deem, MD;  Location: [facility] ENDOSCOPY;  Service: Endoscopy;  Laterality: N/A;  ESOPHAGOGASTRODUODENOSCOPY (EGD) WITH PROPOFOL N/A 12/29/2017   Procedure: ESOPHAGOGASTRODUODENOSCOPY (EGD) WITH PROPOFOL;  Surgeon: Christena Deem, MD;  Location: [facility] ENDOSCOPY;  Service: Endoscopy;  Laterality: N/A;   ESOPHAGOGASTRODUODENOSCOPY (EGD) WITH PROPOFOL N/A 04/01/2018   Procedure: ESOPHAGOGASTRODUODENOSCOPY (EGD) WITH PROPOFOL;  Surgeon: Christena Deem, MD;  Location: [facility] ENDOSCOPY;  Service:  Endoscopy;  Laterality: N/A;   EUS N/A 05/20/2018   Procedure: ESOPHAGEAL ENDOSCOPIC ULTRASOUND (EUS) RADIAL;  Surgeon: Bearl Mulberry, MD;  Location: [facility] ENDOSCOPY;  Service: Gastroenterology;  Laterality: N/A;    Social History:  reports that she has never smoked. She has never used smokeless tobacco. She reports that she does not drink alcohol and does not use drugs.  Family History:  Family History  Problem Relation Age of Onset   Diabetes Mother    Heart disease Mother    Bladder Cancer Neg Hx    Kidney cancer Neg Hx      Prior to Admission medications   Medication Sig Start Date End Date Taking? Authorizing Provider  amLODipine (NORVASC) 5 MG tablet Take 10 mg by mouth daily.  12/11/15   [provider]  ascorbic acid (VITAMIN C) 500 MG tablet Take 500 mg by mouth daily.    [provider]  aspirin EC 81 MG tablet Take 81 mg by mouth daily. Patient not taking: Reported on 01/31/2023    [provider]  atorvastatin (LIPITOR) 80 MG tablet Take 80 mg by mouth daily.  12/11/15   [provider]  brimonidine (ALPHAGAN) 0.2 % ophthalmic solution Place 1 drop into both eyes 2 (two) times daily.  Patient not taking: Reported on 01/31/2023    [provider]  carvedilol (COREG) 25 MG tablet Take by mouth 2 (two) times daily with a meal.  07/15/16   [provider]  donepezil (ARICEPT) 10 MG tablet Take 10 mg by mouth at bedtime.    [provider]  ferrous sulfate 325 (65 FE) MG tablet Take by mouth. Takes one pill every 2-3 days to minimize constipation    [provider]  fluticasone (FLONASE) 50 MCG/ACT nasal spray Place 1 spray into both nostrils 2 (two) times daily. Patient not taking: Reported on 01/31/2023 11/14/21   Rodriguez-Southworth, Nettie Elm, PA-C  gabapentin (NEURONTIN) 300 MG capsule Take 300 mg by mouth 3 (three) times daily.    [provider]  magnesium oxide (MAG-OX) 400 MG tablet Take 400 mg by  mouth daily.     [provider]  mirabegron ER (MYRBETRIQ) 50 MG TB24 tablet Take 1 tablet (50 mg total) by mouth daily. Patient taking differently: Take 25 mg by mouth daily. 12/28/20   [name], MD  Multiple Vitamin (MULTI-VITAMINS) TABS Take 1 tablet by mouth daily.     [provider]  Omega-3 Fatty Acids (FISH OIL) 1000 MG CAPS Take 1,000 mg by mouth daily.    [provider]  torsemide (DEMADEX) 20 MG tablet Take 1 tablet (20 mg total) by mouth daily. 02/05/23   Almon Hercules, MD  travoprost, benzalkonium, (TRAVATAN) 0.004 % ophthalmic solution Place 1 drop into both eyes at bedtime.     [provider]    Physical Exam: Vitals:   07/01/23 0924 07/01/23 0938 07/01/23 0941 07/01/23 0950  BP: (!) 82/43   117/81  Pulse:    (!) 58  Resp:    12  Temp: 98.1 F (36.7 C)     TempSrc: Oral     SpO2:  (!) 87%  98% 99%  Weight: 83.9 kg     Height: 5' (1.524 m)      General: Not in acute distress.  Dry mucous membrane HEENT:       Eyes: PERRL, EOMI, no jaundice       ENT: No discharge from the ears and nose, no pharynx injection, no tonsillar enlargement.        Neck: No JVD, no bruit, no mass felt. Heme: No neck lymph node enlargement. Cardiac: S1/S2, RRR, No murmurs, No gallops or rubs. Respiratory: No rales, wheezing, rhonchi or rubs. GI: Soft, nondistended, nontender, no rebound pain, no organomegaly, BS present. GU: No hematuria Ext: No pitting leg edema bilaterally. 1+DP/PT pulse bilaterally. Musculoskeletal: No joint deformities, No joint redness or warmth, no limitation of ROM in spin. Skin: No rashes.  Neuro: Alert, oriented X3, cranial nerves II-XII grossly intact, moves all extremities normally. Muscle strength 5/5 in all extremities, sensation to light touch intact. Brachial reflex 2+ bilaterally. Psych: Patient is not psychotic, no suicidal or hemocidal ideation.  Labs on Admission: I have personally reviewed following labs and  imaging studies  CBC: Recent Labs  Lab 07/01/23 0930  WBC 5.6  HGB 13.2  HCT 41.9  MCV 93.5  PLT 177   Basic Metabolic Panel: Recent Labs  Lab 07/01/23 0930  NA 138  K 3.7  CL 100  CO2 29  GLUCOSE 135*  BUN 34*  CREATININE 2.30*  CALCIUM 10.8*  MG 1.7   GFR: Estimated Creatinine Clearance: 18.8 mL/min (A) (by C-G formula based on SCr of 2.3 mg/dL (H)). Liver Function Tests: No results for input(s): "AST", "ALT", "ALKPHOS", "BILITOT", "PROT", "ALBUMIN" in the last 168 hours. No results for input(s): "LIPASE", "AMYLASE" in the last 168 hours. No results for input(s): "AMMONIA" in the last 168 hours. Coagulation Profile: No results for input(s): "INR", "PROTIME" in the last 168 hours. Cardiac Enzymes: No results for input(s): "CKTOTAL", "CKMB", "CKMBINDEX", "TROPONINI" in the last 168 hours. BNP (last 3 results) No results for input(s): "PROBNP" in the last 8760 hours. HbA1C: No results for input(s): "HGBA1C" in the last 72 hours. CBG: No results for input(s): "GLUCAP" in the last 168 hours. Lipid Profile: No results for input(s): "CHOL", "HDL", "LDLCALC", "TRIG", "CHOLHDL", "LDLDIRECT" in the last 72 hours. Thyroid Function Tests: No results for input(s): "TSH", "T4TOTAL", "FREET4", "T3FREE", "THYROIDAB" in the last 72 hours. Anemia Panel: No results for input(s): "VITAMINB12", "FOLATE", "FERRITIN", "TIBC", "IRON", "RETICCTPCT" in the last 72 hours. Urine analysis:    Component Value Date/Time   COLORURINE YELLOW (A) 01/31/2023 0906   APPEARANCEUR CLEAR (A) 01/31/2023 0906   APPEARANCEUR Clear 02/16/2018 0910   LABSPEC 1.017 01/31/2023 0906   PHURINE 5.0 01/31/2023 0906   GLUCOSEU NEGATIVE 01/31/2023 0906   HGBUR NEGATIVE 01/31/2023 0906   BILIRUBINUR NEGATIVE 01/31/2023 0906   BILIRUBINUR Negative 02/16/2018 0910   KETONESUR NEGATIVE 01/31/2023 0906   PROTEINUR NEGATIVE 01/31/2023 0906   NITRITE NEGATIVE 01/31/2023 0906   LEUKOCYTESUR NEGATIVE 01/31/2023  0906   Sepsis Labs: @LABRCNTIP (procalcitonin:4,lacticidven:4) )No results found for this or any previous visit (from the past 240 hour(s)).   Radiological Exams on Admission: CT Head Wo Contrast  Result Date: 07/01/2023 CLINICAL DATA:  82 year old female status post witnessed seizure when presenting for scheduled echocardiogram. EXAM: CT HEAD WITHOUT CONTRAST TECHNIQUE: Contiguous axial images were obtained from the base of the skull through the vertex without intravenous contrast. RADIATION DOSE REDUCTION: This exam was performed according to the departmental dose-optimization program which includes automated  exposure control, adjustment of the mA and/or kV according to patient size and/or use of iterative reconstruction technique. COMPARISON:  Head CT 03/04/2021. FINDINGS: Brain: Cerebral volume is not significantly changed since 2022. No midline shift, ventriculomegaly, mass effect, evidence of mass lesion, intracranial hemorrhage or evidence of cortically based acute infarction. Patchy bilateral white matter hypodensity, including some deep white matter capsule involvement, has not significantly changed. No cortical encephalomalacia identified. Vascular: No suspicious intracranial vascular hyperdensity. Calcified atherosclerosis at the skull base. Skull: Intact.  No acute osseous abnormality identified. Sinuses/Orbits: Visualized paranasal sinuses and mastoids are stable and well aerated. Other: Visualized orbits and scalp soft tissues are within normal limits. IMPRESSION: 1. No acute intracranial abnormality or acute traumatic injury identified. 2. Stable non contrast CT appearance of chronic white matter disease. Electronically Signed   By: Odessa Fleming M.D.   On: 07/01/2023 10:35   DG Chest Portable 1 View  Result Date: 07/01/2023 CLINICAL DATA:  Syncope EXAM: PORTABLE CHEST 1 VIEW COMPARISON:  Chest radiograph 01/13/2023 FINDINGS: The cardiomediastinal silhouette is stable. There is unchanged  asymmetric elevation of the right hemidiaphragm. There is no focal consolidation or pulmonary edema. There is no pleural effusion or pneumothorax There is no acute osseous abnormality. IMPRESSION: Unchanged asymmetric elevation of the right hemidiaphragm. No radiographic evidence of acute cardiopulmonary process. Electronically Signed   By: Lesia Hausen M.D.   On: 07/01/2023 10:17      Assessment/Plan Principal Problem:   Syncope Active Problems:   Hypotension   Acute renal failure superimposed on stage 3b chronic kidney disease (HCC)   Hyperlipidemia   Chronic diastolic CHF (congestive heart failure) (HCC)   Chronic kidney disease, stage 3b (HCC)   Type II diabetes mellitus with renal manifestations (HCC)   HLD (hyperlipidemia)   Hypercalcemia   Dementia (HCC)   Obesity (BMI 30-39.9)   Assessment and Plan:   Syncope: Etiology is not clear.  CT head negative.  No focal neurodeficit on physical examination.  Patient had an episode of oxygen desaturation and hypotension, making PE is potential differential diagnosis.  Other differential diagnosis include vasovagal syncope, seizure, TIA/stroke, orthostatic status.  Plan was to get D-dimer, if it is positive, will do VQ scan to rule out PE.  Patient D-dimer came back +3.22.  Unfortunately patient left the hospital on AMA per nurse report.  I did not have chance to talk to patient. I also ordered EEG and 2D echo.  - Placed on tele for obs - Orthostatic vital signs  - EEG --> not done - 2d echo --> nod done  - Neuro checks  - IVF: 1L of LR, NS 75 cc/h  Hypotension -plan to hold BP and give IVF  Acute renal failure superimposed on stage 3b chronic kidney disease (HCC): Baseline creatinine 1.2 on 02/10/2023.  Her creatinine is 2.30, BUN 34, GFR 21 -IVF -Plan to hold diuretics  Hyperlipidemia -Plan to continue her home Lipitor  Chronic diastolic CHF (congestive heart failure) (HCC): 2D echo 07/25/2019 showed EF of 55-60%.  Patient does  not have leg edema or JVD.  Clinically dry. -Plan to hold torsemide  Chronic kidney disease, stage 3b [facility]): Patient has worsening renal function.  Baseline creatinine 1.2 on 02/10/2023.  Her creatinine is at 2.30, BUN 34, GFR 21 -Plan was to treat with IV fluid and hold diuretics  Type II diabetes mellitus with renal manifestations [facility]): Patient seems to be not on medications.  Recent A1c 5.9, well-controlled. -Check CBG every morning  HLD (hyperlipidemia) -  Plan to continue Lipitor  Hypercalcemia: Calcium 10.8, this is chronic issue. -f/u with BMP -on IVF  Dementia (HCC) -Continue donepezil  Obesity (BMI 30-39.9): Body weight 83.9 kg, BMI 36.13 -Encouraged losing weight -Exercise and healthy diet       DVT ppx:  SQ Lovenox  Code Status: Full code   Family Communication:  Yes, patient's niece at bed side.   Disposition Plan:  Anticipate discharge back to previous environment  Consults called:  nine  Admission status and Level of care: Telemetry Medical:    for obs    Dispo: The patient is from: Home              Anticipated d/c is to: Home              Anticipated d/c date is: 1 day              Patient currently is not medically stable to d/c.    Severity of Illness:  The appropriate patient status for this patient is OBSERVATION. Observation status is judged to be reasonable and necessary in order to provide the required intensity of service to ensure the patient's safety. The patient's presenting symptoms, physical exam findings, and initial radiographic and laboratory data in the context of their medical condition is felt to place them at decreased risk for further clinical deterioration. Furthermore, it is anticipated that the patient will be medically stable for discharge from the hospital within 2 midnights of admission.        Date of Service 07/01/2023    Lorretta Harp [facility]   If 7PM-7AM, please contact  night-coverage [email or web address] 07/01/2023, 7:36 PM

## 2023-07-01 NOTE — ED Triage Notes (Signed)
Pt to ED escorted to ED lobby by [facility] nurses who witnessed her having a seizure which walking through [facility] door for scheduled echocardiogram. Pt was unresponsive, came around with sternal rub. Pt brought to room, EDP and nurses at bedside. Pt now responding verbally and answering EDP questions. Pt denies hx seizures. Endorses feeling dizzy before fell down. Hx CHF.

## 2023-07-09 ENCOUNTER — Ambulatory Visit: Admission: RE | Admit: 2023-07-09 | Payer: Medicare Other | Source: Ambulatory Visit

## 2023-07-21 DIAGNOSIS — K5904 Chronic idiopathic constipation: Secondary | ICD-10-CM | POA: Insufficient documentation

## 2023-07-21 DIAGNOSIS — F411 Generalized anxiety disorder: Secondary | ICD-10-CM | POA: Insufficient documentation

## 2023-08-04 DIAGNOSIS — E210 Primary hyperparathyroidism: Secondary | ICD-10-CM | POA: Insufficient documentation

## 2023-08-14 ENCOUNTER — Telehealth (HOSPITAL_COMMUNITY): Payer: Self-pay | Admitting: Emergency Medicine

## 2023-08-14 MED ORDER — METOPROLOL TARTRATE 50 MG PO TABS: 50.0000 mg | ORAL_TABLET | Freq: Once | ORAL | 0 refills | Status: DC

## 2023-08-14 NOTE — Telephone Encounter (Signed)
Unable to leave vm Sara Wallace RN Navigator Cardiac Imaging [facility] [phone] Office  [phone] Cell  

## 2023-08-14 NOTE — Telephone Encounter (Signed)
50mg  metoprolol sent to pharm on file. Rockwell Alexandria RN Navigator Cardiac Imaging [facility] [phone] Office  [phone] Cell

## 2023-08-17 ENCOUNTER — Ambulatory Visit: Admission: RE | Admit: 2023-08-17 | Payer: Medicare Other | Source: Ambulatory Visit

## 2023-08-20 ENCOUNTER — Telehealth (HOSPITAL_COMMUNITY): Payer: Self-pay | Admitting: *Deleted

## 2023-08-20 NOTE — Telephone Encounter (Signed)
Reaching out to patient to offer assistance regarding upcoming cardiac imaging study; pt verbalizes understanding of appt date/time. Patient denies a contrast allergy and is aware to hold her torsemide.  Upon reviewing instructions, patient was overwhelmed and requested that I call her nurse, Dayton Scrape at [phone].  Patient's nurse called and instructions were review with her. [name] is aware patient is to take 50mg  metoprolol tartrate two hours prior and that the patient is to be NPO 1 hours prior to CCTA. The nurse stated she had my phone number on her phone and will reach out with any additional questions.  Larey Brick RN Navigator Cardiac Imaging [facility] [phone] office [phone] cell

## 2023-08-24 ENCOUNTER — Ambulatory Visit
Admission: RE | Admit: 2023-08-24 | Discharge: 2023-08-24 | Disposition: A | Discharge status: Home / Self Care | Payer: Medicare Other | Source: Ambulatory Visit | Attending: Internal Medicine | Admitting: Internal Medicine

## 2023-08-24 DIAGNOSIS — I509 Heart failure, unspecified: Secondary | ICD-10-CM | POA: Insufficient documentation

## 2023-08-24 DIAGNOSIS — I700 Atherosclerosis of aorta: Secondary | ICD-10-CM | POA: Insufficient documentation

## 2023-08-24 DIAGNOSIS — I110 Hypertensive heart disease with heart failure: Secondary | ICD-10-CM | POA: Insufficient documentation

## 2023-08-24 DIAGNOSIS — R079 Chest pain, unspecified: Secondary | ICD-10-CM | POA: Diagnosis present

## 2023-08-24 MED ORDER — IOHEXOL 350 MG/ML SOLN
80.0000 mL | Freq: Once | INTRAVENOUS | Status: AC | PRN
  Administered 2023-08-24: 70 mL via INTRAVENOUS

## 2023-08-24 MED ORDER — NITROGLYCERIN 0.4 MG SL SUBL
0.8000 mg | SUBLINGUAL_TABLET | Freq: Once | SUBLINGUAL | Status: AC
  Administered 2023-08-24: 0.8 mg via SUBLINGUAL
  Filled 2023-08-24: qty 25

## 2023-08-24 NOTE — Progress Notes (Signed)
Patient tolerated procedure well. Ambulate w/o difficulty. Denies any lightheadedness or being dizzy. Pt endorses slight headache after nitroglycerin administration, encouraged to take tylenol as needed q 6-8 hours (not exceeding 4000mg  daily) as well as plenty of water. Sitting in chair, pt is encouraged to drink additional water throughout the day and reason explained to patient. Patient verbalized understanding and all questions answered. ABC intact. No further needs at this time. Discharge from procedure area w/o issues.

## 2023-09-07 ENCOUNTER — Ambulatory Visit: Payer: Medicare Other | Admitting: Physical Therapy

## 2023-09-10 ENCOUNTER — Ambulatory Visit: Payer: Medicare Other | Admitting: Physical Therapy

## 2023-09-16 ENCOUNTER — Encounter: Payer: Medicare Other | Admitting: Physical Therapy

## 2023-09-21 ENCOUNTER — Encounter: Payer: Medicare Other | Admitting: Physical Therapy

## 2023-09-21 ENCOUNTER — Other Ambulatory Visit: Payer: Self-pay

## 2023-09-21 DIAGNOSIS — N2889 Other specified disorders of kidney and ureter: Secondary | ICD-10-CM

## 2023-09-22 ENCOUNTER — Encounter: Payer: Medicare Other | Admitting: Physical Therapy

## 2023-09-24 ENCOUNTER — Encounter: Payer: Medicare Other | Admitting: Physical Therapy

## 2023-09-28 ENCOUNTER — Ambulatory Visit
Admission: RE | Admit: 2023-09-28 | Discharge: 2023-09-28 | Disposition: A | Discharge status: Home / Self Care | Payer: Medicare Other | Source: Ambulatory Visit | Attending: Urology | Admitting: Urology

## 2023-09-28 DIAGNOSIS — N2889 Other specified disorders of kidney and ureter: Secondary | ICD-10-CM | POA: Diagnosis present

## 2023-09-29 ENCOUNTER — Encounter: Payer: Medicare Other | Admitting: Physical Therapy

## 2023-10-01 ENCOUNTER — Encounter: Payer: Medicare Other | Admitting: Physical Therapy

## 2023-10-02 ENCOUNTER — Ambulatory Visit: Admitting: Urology

## 2023-10-06 ENCOUNTER — Encounter: Payer: Medicare Other | Admitting: Physical Therapy

## 2023-10-08 ENCOUNTER — Encounter: Payer: Medicare Other | Admitting: Physical Therapy

## 2023-10-13 ENCOUNTER — Encounter: Payer: Medicare Other | Admitting: Physical Therapy

## 2023-10-15 ENCOUNTER — Encounter: Payer: Medicare Other | Admitting: Physical Therapy

## 2023-10-20 ENCOUNTER — Encounter: Payer: Medicare Other | Admitting: Physical Therapy

## 2023-10-22 ENCOUNTER — Encounter: Payer: Medicare Other | Admitting: Physical Therapy

## 2023-10-27 ENCOUNTER — Encounter: Payer: Medicare Other | Admitting: Physical Therapy

## 2023-10-29 ENCOUNTER — Encounter: Payer: Medicare Other | Admitting: Physical Therapy

## 2023-10-30 ENCOUNTER — Ambulatory Visit: Admitting: Urology

## 2023-10-30 NOTE — Progress Notes (Incomplete)
Marcelle Overlie Plume,acting as a scribe for [name], MD.,have documented all relevant documentation on the behalf of [name], MD,as directed by  [name], MD while in the presence of [name], MD.  10/30/2023 9:28 AM   Shelby [name] Sep 30, 1941 [id number]  Referring provider: Mickey Farber, MD [address] [place],  [place] [address]  No chief complaint on file.   HPI:  82 year-old female   PMH: Past Medical History:  Diagnosis Date   Anemia    Cardiomyopathy (HCC)    Ejection Fraction 30-35% per ECHO 2016   CHF (congestive heart failure) (HCC)    Diabetes (HCC)    Dyspnea    Dysrhythmia    Hyperlipidemia    Hypertension    Peripheral neuropathy    Peripheral neuropathy    Sleep apnea     Surgical History: Past Surgical History:  Procedure Laterality Date   ABDOMINAL HYSTERECTOMY     COLONOSCOPY     COLONOSCOPY N/A 04/01/2018   Procedure: COLONOSCOPY;  Surgeon: Christena Deem, MD;  Location: [facility] ENDOSCOPY;  Service: Endoscopy;  Laterality: N/A;   COLONOSCOPY WITH PROPOFOL N/A 12/29/2017   Procedure: COLONOSCOPY WITH PROPOFOL;  Surgeon: Christena Deem, MD;  Location: [facility] ENDOSCOPY;  Service: Endoscopy;  Laterality: N/A;   ESOPHAGOGASTRODUODENOSCOPY (EGD) WITH PROPOFOL N/A 12/29/2017   Procedure: ESOPHAGOGASTRODUODENOSCOPY (EGD) WITH PROPOFOL;  Surgeon: Christena Deem, MD;  Location: [facility] ENDOSCOPY;  Service: Endoscopy;  Laterality: N/A;   ESOPHAGOGASTRODUODENOSCOPY (EGD) WITH PROPOFOL N/A 04/01/2018   Procedure: ESOPHAGOGASTRODUODENOSCOPY (EGD) WITH PROPOFOL;  Surgeon: Christena Deem, MD;  Location: [facility] ENDOSCOPY;  Service: Endoscopy;  Laterality: N/A;   EUS N/A 05/20/2018   Procedure: ESOPHAGEAL ENDOSCOPIC ULTRASOUND (EUS) RADIAL;  Surgeon: Bearl Mulberry, MD;  Location: [facility] ENDOSCOPY;  Service: Gastroenterology;  Laterality: N/A;    Home Medications:  Allergies as of 10/30/2023       Reactions    Furosemide Other (See Comments)   Legs swelling        Medication List        Accurate as of October 30, 2023  9:28 AM. If you have any questions, ask your nurse or doctor.          amLODipine 5 MG tablet Commonly known as: NORVASC Take 10 mg by mouth daily.   ascorbic acid 500 MG tablet Commonly known as: VITAMIN C Take 500 mg by mouth daily.   aspirin EC 81 MG tablet Take 81 mg by mouth daily.   atorvastatin 80 MG tablet Commonly known as: LIPITOR Take 80 mg by mouth daily.   brimonidine 0.2 % ophthalmic solution Commonly known as: ALPHAGAN Place 1 drop into both eyes 2 (two) times daily.   carvedilol 25 MG tablet Commonly known as: COREG Take by mouth 2 (two) times daily with a meal.   donepezil 10 MG tablet Commonly known as: ARICEPT Take 10 mg by mouth at bedtime.   ferrous sulfate 325 (65 FE) MG tablet Take by mouth. Takes one pill every 2-3 days to minimize constipation   Fish Oil 1000 MG Caps Take 1,000 mg by mouth daily.   fluticasone 50 MCG/ACT nasal spray Commonly known as: FLONASE Place 1 spray into both nostrils 2 (two) times daily.   gabapentin 300 MG capsule Commonly known as: NEURONTIN Take 300 mg by mouth 3 (three) times daily.   magnesium oxide 400 MG tablet Commonly known as: MAG-OX Take 400 mg by mouth daily.   metoprolol tartrate  50 MG tablet Commonly known as: LOPRESSOR Take 1 tablet (50 mg total) by mouth once for 1 dose. Take 2 hr prior to CT scan   mirabegron ER 50 MG Tb24 tablet Commonly known as: MYRBETRIQ Take 1 tablet (50 mg total) by mouth daily.   Multi-Vitamins Tabs Take 1 tablet by mouth daily.   torsemide 20 MG tablet Commonly known as: DEMADEX Take 1 tablet (20 mg total) by mouth daily.   travoprost (benzalkonium) 0.004 % ophthalmic solution Commonly known as: TRAVATAN Place 1 drop into both eyes at bedtime.   Travatan Z 0.004 % Soln ophthalmic solution Generic drug: Travoprost (BAK Free) Place 1  drop into both eyes 2 (two) times daily.        Allergies:  Allergies  Allergen Reactions   Furosemide Other (See Comments)    Legs swelling     Family History: Family History  Problem Relation Age of Onset   Diabetes Mother    Heart disease Mother    Bladder Cancer Neg Hx    Kidney cancer Neg Hx     Social History:  reports that she has never smoked. She has never used smokeless tobacco. She reports that she does not drink alcohol and does not use drugs.   Physical Exam: There were no vitals taken for this visit.  Constitutional:  Alert and oriented, No acute distress. HEENT: [place] AT, moist mucus membranes.  Trachea midline, no masses. Neurologic: Grossly intact, no focal deficits, moving all 4 extremities. Psychiatric: Normal mood and affect.   Pertinent Imaging: EXAM: RENAL / URINARY TRACT ULTRASOUND COMPLETE  COMPARISON:  August 21, 2022  FINDINGS: Right Kidney:  Renal measurements: 9.4 x 4.4 x 4.7 cm = volume: 102 mL. Echogenicity within normal limits. No hydronephrosis visualized. Multiple cysts are noted measuring up to 15 mm. The enhancing 9 mm mass noted on prior MRIs is sonographically occult.  Left Kidney:  Renal measurements: 9.1 x 4.7 x 4.0 cm = volume: 88 mL. Echogenicity within normal limits. No hydronephrosis visualized. Multiple cysts are noted including a 3.8 cm mildly complicated cyst with internal echoes of the superior pole, previously characterized as a benign cyst on prior MRI (for which no dedicated imaging follow-up is recommended).  Bladder:  Appears normal for degree of bladder distention.  Other:  None.  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 Recommend further surveillance be performed by MRI.   Electronically Signed By: Meda Klinefelter M.D. On: 10/17/2023 12:25  This was personally reviewed and I agree with the radiologic interpretation.  Assessment &  Plan:    @DIAGMED @  No follow-ups on file.   [facility] [address], [address] [place], [place] [address] [phone]

## 2023-11-02 ENCOUNTER — Encounter: Payer: Medicare Other | Admitting: Physical Therapy

## 2023-11-04 ENCOUNTER — Encounter: Payer: Medicare Other | Admitting: Physical Therapy

## 2024-03-01 DIAGNOSIS — R569 Unspecified convulsions: Secondary | ICD-10-CM | POA: Insufficient documentation

## 2024-05-17 ENCOUNTER — Emergency Department

## 2024-05-17 ENCOUNTER — Observation Stay
Admission: EM | Admit: 2024-05-17 | Discharge: 2024-05-19 | Disposition: A | Discharge status: Home / Self Care | Attending: Internal Medicine | Admitting: Internal Medicine

## 2024-05-17 ENCOUNTER — Other Ambulatory Visit: Payer: Self-pay

## 2024-05-17 DIAGNOSIS — F0390 Unspecified dementia without behavioral disturbance: Secondary | ICD-10-CM | POA: Diagnosis not present

## 2024-05-17 DIAGNOSIS — N179 Acute kidney failure, unspecified: Secondary | ICD-10-CM | POA: Insufficient documentation

## 2024-05-17 DIAGNOSIS — Z7951 Long term (current) use of inhaled steroids: Secondary | ICD-10-CM | POA: Insufficient documentation

## 2024-05-17 DIAGNOSIS — Z7982 Long term (current) use of aspirin: Secondary | ICD-10-CM | POA: Insufficient documentation

## 2024-05-17 DIAGNOSIS — I5032 Chronic diastolic (congestive) heart failure: Secondary | ICD-10-CM | POA: Diagnosis not present

## 2024-05-17 DIAGNOSIS — E876 Hypokalemia: Secondary | ICD-10-CM | POA: Diagnosis not present

## 2024-05-17 DIAGNOSIS — E785 Hyperlipidemia, unspecified: Secondary | ICD-10-CM | POA: Diagnosis not present

## 2024-05-17 DIAGNOSIS — Z79899 Other long term (current) drug therapy: Secondary | ICD-10-CM | POA: Diagnosis not present

## 2024-05-17 DIAGNOSIS — R2689 Other abnormalities of gait and mobility: Secondary | ICD-10-CM | POA: Insufficient documentation

## 2024-05-17 DIAGNOSIS — R55 Syncope and collapse: Secondary | ICD-10-CM | POA: Diagnosis not present

## 2024-05-17 DIAGNOSIS — R4020 Unspecified coma: Principal | ICD-10-CM

## 2024-05-17 DIAGNOSIS — E1122 Type 2 diabetes mellitus with diabetic chronic kidney disease: Secondary | ICD-10-CM | POA: Diagnosis not present

## 2024-05-17 DIAGNOSIS — E1169 Type 2 diabetes mellitus with other specified complication: Secondary | ICD-10-CM

## 2024-05-17 DIAGNOSIS — E669 Obesity, unspecified: Secondary | ICD-10-CM | POA: Diagnosis not present

## 2024-05-17 DIAGNOSIS — I959 Hypotension, unspecified: Principal | ICD-10-CM | POA: Insufficient documentation

## 2024-05-17 DIAGNOSIS — Z6839 Body mass index (BMI) 39.0-39.9, adult: Secondary | ICD-10-CM | POA: Diagnosis not present

## 2024-05-17 DIAGNOSIS — I130 Hypertensive heart and chronic kidney disease with heart failure and stage 1 through stage 4 chronic kidney disease, or unspecified chronic kidney disease: Secondary | ICD-10-CM | POA: Diagnosis not present

## 2024-05-17 DIAGNOSIS — E1159 Type 2 diabetes mellitus with other circulatory complications: Secondary | ICD-10-CM | POA: Diagnosis present

## 2024-05-17 DIAGNOSIS — R531 Weakness: Secondary | ICD-10-CM | POA: Diagnosis present

## 2024-05-17 DIAGNOSIS — E1129 Type 2 diabetes mellitus with other diabetic kidney complication: Secondary | ICD-10-CM | POA: Diagnosis present

## 2024-05-17 DIAGNOSIS — N1832 Chronic kidney disease, stage 3b: Secondary | ICD-10-CM | POA: Insufficient documentation

## 2024-05-17 LAB — CBC WITH DIFFERENTIAL/PLATELET
Abs Immature Granulocytes: 0.02 10*3/uL (ref 0.00–0.07)
Basophils Absolute: 0 10*3/uL (ref 0.0–0.1)
Basophils Relative: 1 %
Eosinophils Absolute: 0.2 10*3/uL (ref 0.0–0.5)
Eosinophils Relative: 2 %
HCT: 43.4 % (ref 36.0–46.0)
Hemoglobin: 14 g/dL (ref 12.0–15.0)
Immature Granulocytes: 0 %
Lymphocytes Relative: 27 %
Lymphs Abs: 1.8 10*3/uL (ref 0.7–4.0)
MCH: 30.6 pg (ref 26.0–34.0)
MCHC: 32.3 g/dL (ref 30.0–36.0)
MCV: 94.8 fL (ref 80.0–100.0)
Monocytes Absolute: 0.6 10*3/uL (ref 0.1–1.0)
Monocytes Relative: 8 %
Neutro Abs: 4.1 10*3/uL (ref 1.7–7.7)
Neutrophils Relative %: 62 %
Platelets: 180 10*3/uL (ref 150–400)
RBC: 4.58 MIL/uL (ref 3.87–5.11)
RDW: 14.6 % (ref 11.5–15.5)
WBC: 6.6 10*3/uL (ref 4.0–10.5)
nRBC: 0 % (ref 0.0–0.2)

## 2024-05-17 LAB — COMPREHENSIVE METABOLIC PANEL WITH GFR
ALT: 17 U/L (ref 0–44)
AST: 28 U/L (ref 15–41)
Albumin: 4.1 g/dL (ref 3.5–5.0)
Alkaline Phosphatase: 50 U/L (ref 38–126)
Anion gap: 11 (ref 5–15)
BUN: 35 mg/dL — ABNORMAL HIGH (ref 8–23)
CO2: 30 mmol/L (ref 22–32)
Calcium: 11.7 mg/dL — ABNORMAL HIGH (ref 8.9–10.3)
Chloride: 100 mmol/L (ref 98–111)
Creatinine, Ser: 2.41 mg/dL — ABNORMAL HIGH (ref 0.44–1.00)
GFR, Estimated: 20 mL/min — ABNORMAL LOW (ref >60)
Glucose, Bld: 119 mg/dL — ABNORMAL HIGH (ref 70–99)
Potassium: 3.7 mmol/L (ref 3.5–5.1)
Sodium: 141 mmol/L (ref 135–145)
Total Bilirubin: 0.9 mg/dL (ref 0.0–1.2)
Total Protein: 7.5 g/dL (ref 6.5–8.1)

## 2024-05-17 LAB — URINALYSIS, W/ REFLEX TO CULTURE (INFECTION SUSPECTED)
Bilirubin Urine: NEGATIVE
Glucose, UA: NEGATIVE mg/dL
Hgb urine dipstick: NEGATIVE
Ketones, ur: NEGATIVE mg/dL
Leukocytes,Ua: NEGATIVE
Nitrite: NEGATIVE
Protein, ur: NEGATIVE mg/dL
Specific Gravity, Urine: 1.01 (ref 1.005–1.030)
Squamous Epithelial / HPF: 0 /HPF (ref 0–5)
pH: 5 (ref 5.0–8.0)

## 2024-05-17 LAB — TROPONIN I (HIGH SENSITIVITY)
Troponin I (High Sensitivity): 18 ng/L — ABNORMAL HIGH (ref <18)
Troponin I (High Sensitivity): 16 ng/L (ref <18)

## 2024-05-17 LAB — CBG MONITORING, ED: Glucose-Capillary: 114 mg/dL — ABNORMAL HIGH (ref 70–99)

## 2024-05-17 LAB — LACTIC ACID, PLASMA: Lactic Acid, Venous: 1.1 mmol/L (ref 0.5–1.9)

## 2024-05-17 MED ORDER — ONDANSETRON HCL 4 MG PO TABS
4.0000 mg | ORAL_TABLET | Freq: Four times a day (QID) | ORAL | Status: DC | PRN
Start: 2024-05-17 — End: 2024-05-19

## 2024-05-17 MED ORDER — ACETAMINOPHEN 325 MG PO TABS
650.0000 mg | ORAL_TABLET | Freq: Four times a day (QID) | ORAL | Status: DC | PRN
Start: 2024-05-17 — End: 2024-05-19

## 2024-05-17 MED ORDER — SODIUM CHLORIDE 0.9 % IV SOLN: INTRAVENOUS | Status: AC

## 2024-05-17 MED ORDER — ACETAMINOPHEN 650 MG RE SUPP: 650.0000 mg | Freq: Four times a day (QID) | RECTAL | Status: DC | PRN

## 2024-05-17 MED ORDER — INSULIN ASPART 100 UNIT/ML IJ SOLN: 0.0000 [IU] | Freq: Every day | INTRAMUSCULAR | Status: DC

## 2024-05-17 MED ORDER — SODIUM CHLORIDE 0.9% FLUSH
3.0000 mL | Freq: Two times a day (BID) | INTRAVENOUS | Status: DC
  Administered 2024-05-17 – 2024-05-19 (×3): 3 mL via INTRAVENOUS

## 2024-05-17 MED ORDER — DONEPEZIL HCL 5 MG PO TABS
10.0000 mg | ORAL_TABLET | Freq: Every day | ORAL | Status: DC
  Administered 2024-05-17 – 2024-05-18 (×2): 10 mg via ORAL
  Filled 2024-05-17 (×3): qty 2

## 2024-05-17 MED ORDER — INSULIN ASPART 100 UNIT/ML IJ SOLN
0.0000 [IU] | Freq: Three times a day (TID) | INTRAMUSCULAR | Status: DC
  Administered 2024-05-18: 1 [IU] via SUBCUTANEOUS
  Filled 2024-05-17: qty 1

## 2024-05-17 MED ORDER — ENOXAPARIN SODIUM 30 MG/0.3ML IJ SOSY
30.0000 mg | PREFILLED_SYRINGE | INTRAMUSCULAR | Status: DC
  Administered 2024-05-17 – 2024-05-18 (×2): 30 mg via SUBCUTANEOUS
  Filled 2024-05-17 (×2): qty 0.3

## 2024-05-17 MED ORDER — LACTATED RINGERS IV BOLUS
1000.0000 mL | Freq: Once | INTRAVENOUS | Status: AC
  Administered 2024-05-17: 1000 mL via INTRAVENOUS

## 2024-05-17 MED ORDER — ONDANSETRON HCL 4 MG/2ML IJ SOLN: 4.0000 mg | Freq: Four times a day (QID) | INTRAMUSCULAR | Status: DC | PRN

## 2024-05-17 NOTE — Hospital Course (Signed)
 Patient HTN, HLD, DM, HFpEF, dementia, CKD 3B, who presents to the ED with a witnessed syncopal episode.  According to the ED provider, her son noted that she was sleepier than usual during the course of the day and was eating less.  She went outside and he heard a noise and when he went outside he saw her sitting unconscious in a chair drooling.  There was no jerking activity and she was not incontinent.  She aroused after about 5 minutes.    Vitals were reassuring on arrival of EMS however upon evaluation in triage, initial blood pressure was 64/67.   Of note, during her hospitalization a year ago from 7/24, she had reported jerking movements and echocardiogram and EEG were ordered however she signed out AMA after she felt better.  They recommended holding her diuretics.  Labs notable for creatinine of 2.41 up from 1.44 a year ago when she was hospitalized with syncope of unknown unknown etiology.  Labs otherwise unremarkable.  EKG showing sinus at 65 without other abnormal.  CT head nonacute and chest x-ray nonacute.  Patient received 2 L LR bolus.  Admission requested

## 2024-05-17 NOTE — H&P (Signed)
 History and Physical    Patient: Shelby [name] XBM:[id number] DOB: 1941-03-25 DOA: 05/17/2024 DOS: the patient was seen and examined on 05/17/2024 PCP: Efraim Grange, NP  Patient coming from: Home  Chief Complaint:  Chief Complaint  Patient presents with   Weakness    HPI: Shelby [name] is a 83 y.o. female with medical history significant for Patient HTN, HLD, DM, HFpEF, dementia, CKD 3B, who presents to the ED with a witnessed syncopal episode.  According to the ED provider, her son noted that she was sleepier than usual during the course of the day and was eating less.  She went outside and he heard a noise and when he went outside he saw her sitting unconscious in a chair drooling.  There was no jerking activity and she was not incontinent.  She aroused after about 5 minutes.    Vitals were reassuring on arrival of EMS however upon evaluation in triage, initial blood pressure was 64/67.   Of note, during her hospitalization a year ago from 7/24, she had reported jerking movements and echocardiogram and EEG were ordered however she signed out AMA after she felt better.  They recommended holding her diuretics.  Labs notable for creatinine of 2.41 up from 1.44 a year ago when she was hospitalized with syncope of unknown unknown etiology.  Labs otherwise unremarkable.  EKG showing sinus at 65 without other abnormal.  CT head nonacute and chest x-ray nonacute.  Patient received 2 L LR bolus.  Admission requested     Past Medical History:  Diagnosis Date   Anemia    Cardiomyopathy (HCC)    Ejection Fraction 30-35% per ECHO 2016   CHF (congestive heart failure) (HCC)    Diabetes (HCC)    Dyspnea    Dysrhythmia    Hyperlipidemia    Hypertension    Peripheral neuropathy    Peripheral neuropathy    Sleep apnea    Past Surgical History:  Procedure Laterality Date   ABDOMINAL HYSTERECTOMY     COLONOSCOPY     COLONOSCOPY N/A 04/01/2018   Procedure: COLONOSCOPY;   Surgeon: Deveron Fly, MD;  Location: [facility] ENDOSCOPY;  Service: Endoscopy;  Laterality: N/A;   COLONOSCOPY WITH PROPOFOL  N/A 12/29/2017   Procedure: COLONOSCOPY WITH PROPOFOL ;  Surgeon: Deveron Fly, MD;  Location: [facility] ENDOSCOPY;  Service: Endoscopy;  Laterality: N/A;   ESOPHAGOGASTRODUODENOSCOPY (EGD) WITH PROPOFOL  N/A 12/29/2017   Procedure: ESOPHAGOGASTRODUODENOSCOPY (EGD) WITH PROPOFOL ;  Surgeon: Deveron Fly, MD;  Location: [facility] ENDOSCOPY;  Service: Endoscopy;  Laterality: N/A;   ESOPHAGOGASTRODUODENOSCOPY (EGD) WITH PROPOFOL  N/A 04/01/2018   Procedure: ESOPHAGOGASTRODUODENOSCOPY (EGD) WITH PROPOFOL ;  Surgeon: Deveron Fly, MD;  Location: [facility] ENDOSCOPY;  Service: Endoscopy;  Laterality: N/A;   EUS N/A 05/20/2018   Procedure: ESOPHAGEAL ENDOSCOPIC ULTRASOUND (EUS) RADIAL;  Surgeon: Eloisa Hait, MD;  Location: [facility] ENDOSCOPY;  Service: Gastroenterology;  Laterality: N/A;   Social History:  reports that she has never smoked. She has never used smokeless tobacco. She reports that she does not drink alcohol and does not use drugs.  Allergies  Allergen Reactions   Furosemide  Other (See Comments)    Legs swelling     Family History  Problem Relation Age of Onset   Diabetes Mother    Heart disease Mother    Bladder Cancer Neg Hx    Kidney cancer Neg Hx     Prior to Admission medications   Medication Sig Start Date End Date Taking? Authorizing Provider  amLODipine  (  NORVASC ) 5 MG tablet Take 10 mg by mouth daily.  Patient not taking: Reported on 07/01/2023 12/11/15   [provider]  ascorbic acid  (VITAMIN C ) 500 MG tablet Take 500 mg by mouth daily.    [provider]  aspirin EC 81 MG tablet Take 81 mg by mouth daily. Patient not taking: Reported on 07/01/2023    [provider]  atorvastatin  (LIPITOR) 80 MG tablet Take 80 mg by mouth daily.  12/11/15   [provider]  brimonidine (ALPHAGAN) 0.2 % ophthalmic solution Place  1 drop into both eyes 2 (two) times daily.    [provider]  carvedilol  (COREG ) 25 MG tablet Take by mouth 2 (two) times daily with a meal.  07/15/16   [provider]  donepezil  (ARICEPT ) 10 MG tablet Take 10 mg by mouth at bedtime.    [provider]  ferrous sulfate  325 (65 FE) MG tablet Take by mouth. Takes one pill every 2-3 days to minimize constipation    [provider]  fluticasone  (FLONASE ) 50 MCG/ACT nasal spray Place 1 spray into both nostrils 2 (two) times daily. Patient not taking: Reported on 07/01/2023 11/14/21   Rodriguez-Southworth, Sylvia, PA-C  gabapentin  (NEURONTIN ) 300 MG capsule Take 300 mg by mouth 3 (three) times daily.    [provider]  magnesium  oxide (MAG-OX) 400 MG tablet Take 400 mg by mouth daily.     [provider]  metoprolol  tartrate (LOPRESSOR ) 50 MG tablet Take 1 tablet (50 mg total) by mouth once for 1 dose. Take 2 hr prior to CT scan 08/14/23 08/14/23  Constancia Delton, MD  mirabegron  ER (MYRBETRIQ ) 50 MG TB24 tablet Take 1 tablet (50 mg total) by mouth daily. 12/28/20   Dustin Gimenez, MD  Multiple Vitamin (MULTI-VITAMINS) TABS Take 1 tablet by mouth daily.     [provider]  Omega-3 Fatty Acids (FISH OIL) 1000 MG CAPS Take 1,000 mg by mouth daily. Patient not taking: Reported on 07/01/2023    [provider]  torsemide  (DEMADEX ) 20 MG tablet Take 1 tablet (20 mg total) by mouth daily. 02/05/23   Gonfa, Taye T, MD  TRAVATAN  Z 0.004 % SOLN ophthalmic solution Place 1 drop into both eyes 2 (two) times daily.    [provider]  travoprost , benzalkonium, (TRAVATAN ) 0.004 % ophthalmic solution Place 1 drop into both eyes at bedtime.     [provider]    Physical Exam: Vitals:   05/17/24 1445 05/17/24 1518 05/17/24 1816 05/17/24 1908  BP: 111/63 133/77  112/72  Pulse: (!) 53 61  65  Resp: 20 15  14   Temp:   98 F (36.7 C)   TempSrc:      SpO2: 94% 95%  94%    Physical Exam Vitals and nursing note reviewed.  Constitutional:      General: She is not in acute distress. HENT:     Head: Normocephalic and atraumatic.  Cardiovascular:     Rate and Rhythm: Normal rate and regular rhythm.     Heart sounds: Normal heart sounds.  Pulmonary:     Effort: Pulmonary effort is normal.     Breath sounds: Normal breath sounds.  Abdominal:     Palpations: Abdomen is soft.     Tenderness: There is no abdominal tenderness.  Neurological:     Mental Status: Mental status is at baseline.     Labs on Admission: I have personally reviewed following labs and imaging studies  CBC:  Recent Labs  Lab 05/17/24 1433  WBC 6.6  NEUTROABS 4.1  HGB 14.0  HCT 43.4  MCV 94.8  PLT 180   Basic Metabolic Panel: Recent Labs  Lab 05/17/24 1433  NA 141  K 3.7  CL 100  CO2 30  GLUCOSE 119*  BUN 35*  CREATININE 2.41*  CALCIUM  11.7*   GFR: CrCl cannot be calculated (Unknown ideal weight.). Liver Function Tests: Recent Labs  Lab 05/17/24 1433  AST 28  ALT 17  ALKPHOS 50  BILITOT 0.9  PROT 7.5  ALBUMIN  4.1   No results for input(s): LIPASE, AMYLASE in the last 168 hours. No results for input(s): AMMONIA in the last 168 hours. Coagulation Profile: No results for input(s): INR, PROTIME in the last 168 hours. Cardiac Enzymes: No results for input(s): CKTOTAL, CKMB, CKMBINDEX, TROPONINI in the last 168 hours. BNP (last 3 results) No results for input(s): PROBNP in the last 8760 hours. HbA1C: No results for input(s): HGBA1C in the last 72 hours. CBG: No results for input(s): GLUCAP in the last 168 hours. Lipid Profile: No results for input(s): CHOL, HDL, LDLCALC, TRIG, CHOLHDL, LDLDIRECT in the last 72 hours. Thyroid  Function Tests: No results for input(s): TSH, T4TOTAL, FREET4, T3FREE, THYROIDAB in the last 72 hours. Anemia Panel: No results for input(s): VITAMINB12, FOLATE, FERRITIN, TIBC,  IRON, RETICCTPCT in the last 72 hours. Urine analysis:    Component Value Date/Time   COLORURINE YELLOW (A) 01/31/2023 0906   APPEARANCEUR CLEAR (A) 01/31/2023 0906   APPEARANCEUR Clear 02/16/2018 0910   LABSPEC 1.017 01/31/2023 0906   PHURINE 5.0 01/31/2023 0906   GLUCOSEU NEGATIVE 01/31/2023 0906   HGBUR NEGATIVE 01/31/2023 0906   BILIRUBINUR NEGATIVE 01/31/2023 0906   BILIRUBINUR Negative 02/16/2018 0910   KETONESUR NEGATIVE 01/31/2023 0906   PROTEINUR NEGATIVE 01/31/2023 0906   NITRITE NEGATIVE 01/31/2023 0906   LEUKOCYTESUR NEGATIVE 01/31/2023 0906    Radiological Exams on Admission: CT HEAD WO CONTRAST ( ) Result Date: 05/17/2024 CLINICAL DATA:  syncope vs seizure, recent fall EXAM: CT HEAD WITHOUT CONTRAST TECHNIQUE: Contiguous axial images were obtained from the base of the skull through the vertex without intravenous contrast. RADIATION DOSE REDUCTION: This exam was performed according to the departmental dose-optimization program which includes automated exposure control, adjustment of the mA and/or kV according to patient size and/or use of iterative reconstruction technique. COMPARISON:  07/01/2023 FINDINGS: Brain: No acute intracranial abnormality. Specifically, no hemorrhage, hydrocephalus, mass lesion, acute infarction, or significant intracranial injury. Vascular: No hyperdense vessel or unexpected calcification. Skull: No acute calvarial abnormality. Sinuses/Orbits: No acute findings Other: None IMPRESSION: No acute intracranial abnormality. Electronically Signed   By: Janeece Mechanic M.D.   On: 05/17/2024 17:33   DG Chest 1 View Result Date: 05/17/2024 CLINICAL DATA:  Hypotensive EXAM: CHEST  1 VIEW COMPARISON:  July 24 24 FINDINGS: The heart size and mediastinal contours are within normal limits. Both lungs are clear. The visualized skeletal structures are unremarkable. IMPRESSION: No active disease. Electronically Signed   By: Fredrich Jefferson M.D.   On: 05/17/2024 15:19    Data Reviewed for HPI: Relevant notes from primary care and specialist visits, past discharge summaries as available in EHR, including Care Everywhere. Prior diagnostic testing as pertinent to current admission diagnoses Updated medications and problem lists for reconciliation ED course, including vitals, labs, imaging, treatment and response to treatment Triage notes, nursing and pharmacy notes and ED provider's notes Notable results as noted above in HPI      Assessment and Plan:  Syncope: Etiology is not clear.  CT head negative.  No focal neurodeficit on physical examination.  Patient had an episode of oxygen desaturation and hypotension, making PE is potential differential diagnosis.  Other differential diagnosis include vasovagal syncope, seizure, TIA/stroke,  -D-Dimer - Placed on tele for obs - Orthostatic vital signs  - EEG  - 2d echo  - Neuro checks  - IVF: 1L of LR, NS 75 cc/h   Hypotension -plan to hold BP and give IVF   Acute renal failure superimposed on stage 3b chronic kidney disease (HCC):  -IVF -Plan to hold diuretics   Hyperlipidemia -Plan to continue her home Lipitor   Chronic diastolic CHF (congestive heart failure) (HCC): 2D echo 07/25/2019 showed EF of 55-60%.  Patient does not have leg edema or JVD.  Clinically dry. -Plan to hold torsemide    Type II diabetes mellitus with renal manifestations [facility]): Patient seems to be not on medications.  well-controlled. -Check CBG every morning   HLD (hyperlipidemia) -Plan to continue Lipitor  Dementia (HCC) -Continue donepezil    Obesity (BMI 30-39.9): Body weight 83.9 kg, BMI 36.13 -Encouraged losing weight -Exercise and healthy diet     DVT prophylaxis: Lovenox   Consults: none  Advance Care Planning:   Code Status: Prior   Family Communication: none  Disposition Plan: Back to previous home environment  Severity of Illness: The appropriate patient status for this patient is OBSERVATION.  Observation status is judged to be reasonable and necessary in order to provide the required intensity of service to ensure the patient's safety. The patient's presenting symptoms, physical exam findings, and initial radiographic and laboratory data in the context of their medical condition is felt to place them at decreased risk for further clinical deterioration. Furthermore, it is anticipated that the patient will be medically stable for discharge from the hospital within 2 midnights of admission.   Author: Lanetta Pion, MD 05/17/2024 8:13 PM  For on call review [email or web address].

## 2024-05-17 NOTE — ED Triage Notes (Signed)
 Pt here with weakness. Pt has been sleeping for the past couple days and c/o weakness. Pt hypotensive in triage at 64/47. Pt had a fall last week as well.

## 2024-05-17 NOTE — ED Triage Notes (Signed)
 First Nurse Note: Patient to ED via [facility] from home for near syncope. PT c/o being tired and weak. Family wanted pt evaluated.   130/90 60 HR 125 cbg 98 temp

## 2024-05-17 NOTE — ED Provider Notes (Signed)
 [facility] Provider Note    Event Date/Time   First MD Initiated Contact with Patient 05/17/24 1455     (approximate)   History   Weakness  First Nurse Note: Patient to ED via [facility] from home for near syncope. PT c/o being tired and weak. Family wanted pt evaluated.   130/90 60 HR 125 cbg 98 temp  Pt here with weakness. Pt has been sleeping for the past couple days and c/o weakness. Pt hypotensive in triage at 64/47. Pt had a fall last week as well.   HPI Shelby [name] is a 83 y.o. female  pmh multiple medical comorbidities including chf, anemia, CM, T2DM, dementia, CKD presents for evaluation after a near syncopal episode - pt is a limited historian, unsure why she is at the hospital. Collateral gathered from family bedside.  - pt was reportedly sleepier than usual today, did not want to get up when she normally does, had minimal po intake. Family went outside, heard patient make a noise, came back inside to find her nonresponsive. No convulsions, was seated in chair, drooling from mouth. Episode lasted about 5 minutes. Now back to baseline.  - had a fall about a week ago - family was visiting, planning to go home this coming week, otherwise lives alone.  - no recent infectious sx  Last echo on file 2020:  1. The left ventricle has normal systolic function, with an ejection  fraction of 55-60%. The cavity size was normal. There is mildly increased  left ventricular wall thickness. Left ventricular diastolic Doppler  parameters are consistent with impaired  relaxation. No evidence of left ventricular regional wall motion  abnormalities.   2. The right ventricle has normal systolic function. The cavity was  normal. There is no increase in right ventricular wall thickness.   3. The tricuspid valve is grossly normal.   4. The aortic valve is grossly normal.   5. The aorta is normal unless otherwise noted.       Physical Exam   Triage  Vital Signs: ED Triage Vitals  Encounter Vitals Group     BP 05/17/24 1429 (!) 64/47     Systolic BP Percentile --      Diastolic BP Percentile --      Pulse Rate 05/17/24 1428 62     Resp 05/17/24 1428 17     Temp 05/17/24 1428 98.4 F (36.9 C)     Temp Source 05/17/24 1428 Oral     SpO2 05/17/24 1428 100 %     Weight --      Height --      Head Circumference --      Peak Flow --      Pain Score 05/17/24 1428 0     Pain Loc --      Pain Education --      Exclude from Growth Chart --     Most recent vital signs: Vitals:   05/17/24 1816 05/17/24 1908  BP:  112/72  Pulse:  65  Resp:  14  Temp: 98 F (36.7 C)   SpO2:  94%     General: Awake, no distress.  HEENT:  normocephalic, atraumatic CV:  Good peripheral perfusion. RRR, RP 2+ Resp:  Normal effort. CTAB Abd:  No distention. Nontender to deep palpation throughout Neuro:  Aox1, CN II-XII intact, FNF wnl, finger taps fast b/l, 5/5 strength in bilateral finger extension/grip, arm flexion/extension, EHL/FHL. BUE AG 10+ sec no drift, BLE  AG 5+ sec no drift. SILT. Gait testing deferred    ED Results / Procedures / Treatments   Labs (all labs ordered are listed, but only abnormal results are displayed) Labs Reviewed  COMPREHENSIVE METABOLIC PANEL WITH GFR - Abnormal; Notable for the following components:      Result Value   Glucose, Bld 119 (*)    BUN 35 (*)    Creatinine, Ser 2.41 (*)    Calcium  11.7 (*)    GFR, Estimated 20 (*)    All other components within normal limits  TROPONIN I (HIGH SENSITIVITY) - Abnormal; Notable for the following components:   Troponin I (High Sensitivity) 18 (*)    All other components within normal limits  CULTURE, BLOOD (ROUTINE X 2)  CBC WITH DIFFERENTIAL/PLATELET  LACTIC ACID, PLASMA  URINALYSIS, W/ REFLEX TO CULTURE (INFECTION SUSPECTED)  TROPONIN I (HIGH SENSITIVITY)     EKG  See ED course below.    RADIOLOGY Radiology interpreted by myself and radiology reports  reviewed. CXR unremarkable. CTH also interpreted and radiology report reviewed, unremarkable     PROCEDURES:  Critical Care performed: No  Procedures   MEDICATIONS ORDERED IN ED: Medications  lactated ringers  bolus 1,000 mL (1,000 mLs Intravenous New Bag/Given 05/17/24 1502)     IMPRESSION / MDM / ASSESSMENT AND PLAN / ED COURSE  I reviewed the triage vital signs and the nursing notes.                              DDX/MDM/AP: Differential diagnosis includes, but is not limited to, arrhythmia, ACS, vasovagal episode, intracranial hemorrhage, consider but doubt seizure based on history. Consider underlying anemia, electrolyte abnl. No hypotension w/ EMS, single hypotensive reading at triage but subsequently normotensive -- consider erroneous read or transient hypotension.   Plan: - labs - ecg - cxr - cardiac monitor - [facility]  Patient's presentation is most consistent with acute presentation with potential threat to life or bodily function.  The patient is on the cardiac monitor to evaluate for evidence of arrhythmia and/or significant heart rate changes.  ED course below.  Fortunately workup reassuring here and patient with no recurrent hypotension.  Did have very mildly elevated troponin but normalized on repeat, suspect secondary to a prior episode of hypotension.  No obvious EKG ischemic changes.  Mild bump in creatinine, received IV fluid on arrival.  Given history of heart failure with prolonged syncopal episode, I do believe it would be reasonable to monitor patient for any arrhythmias.  Last echo 2020, can consider whether repeat may be indicated.  Admitted to hospital service for syncopal episode.  Clinical Course as of 05/17/24 1928  Tue May 17, 2024  1511 Ecg = nsr, rate 65, no ste/std, no repol abnl (isolated twi in lead III is nonspecific), +LAD, normal intervals. No e/o ischemia, arrhythmia on my read.  [MM]  1523 Cbc wnl [MM]  1547 Troponin very mildly elevated, new  from prior [MM]  1619 CMP reviewed, mild bump in creatinine compared to prior, otherwise unremarkable [MM]  1619 Lactate wnl [MM]  1744 Rpt trop downtrending [MM]  1744 CT head with no acute pathology on my interpretation, radiology report reviewed, below: IMPRESSION: No acute intracranial abnormality.   [MM]  1920 Patient reevaluated, has not yet been able to provide urine sample.  Remains asymptomatic here.  Discussed possible admission given syncope with history of underlying heart failure [MM]    Clinical Course  User Index [MM] Collis Deaner, MD     FINAL CLINICAL IMPRESSION(S) / ED DIAGNOSES   Final diagnoses:  Loss of consciousness (HCC)  Syncope, unspecified syncope type     Rx / DC Orders   ED Discharge Orders     None        Note:  This document was prepared using Dragon voice recognition software and may include unintentional dictation errors.   Collis Deaner, MD 05/17/24 Carlette Cheers

## 2024-05-18 ENCOUNTER — Observation Stay (HOSPITAL_BASED_OUTPATIENT_CLINIC_OR_DEPARTMENT_OTHER)
Admit: 2024-05-18 | Discharge: 2024-05-18 | Disposition: A | Discharge status: Home / Self Care | Attending: Internal Medicine | Admitting: Internal Medicine

## 2024-05-18 ENCOUNTER — Observation Stay

## 2024-05-18 DIAGNOSIS — I959 Hypotension, unspecified: Secondary | ICD-10-CM

## 2024-05-18 DIAGNOSIS — R55 Syncope and collapse: Secondary | ICD-10-CM

## 2024-05-18 LAB — CBG MONITORING, ED
Glucose-Capillary: 98 mg/dL (ref 70–99)
Glucose-Capillary: 132 mg/dL — ABNORMAL HIGH (ref 70–99)
Glucose-Capillary: 112 mg/dL — ABNORMAL HIGH (ref 70–99)

## 2024-05-18 LAB — BASIC METABOLIC PANEL WITH GFR
Anion gap: 6 (ref 5–15)
BUN: 40 mg/dL — ABNORMAL HIGH (ref 8–23)
CO2: 31 mmol/L (ref 22–32)
Calcium: 10.8 mg/dL — ABNORMAL HIGH (ref 8.9–10.3)
Chloride: 106 mmol/L (ref 98–111)
Creatinine, Ser: 2.04 mg/dL — ABNORMAL HIGH (ref 0.44–1.00)
GFR, Estimated: 24 mL/min — ABNORMAL LOW (ref >60)
Glucose, Bld: 99 mg/dL (ref 70–99)
Potassium: 3.3 mmol/L — ABNORMAL LOW (ref 3.5–5.1)
Sodium: 143 mmol/L (ref 135–145)

## 2024-05-18 LAB — CBC
HCT: 38 % (ref 36.0–46.0)
Hemoglobin: 12.4 g/dL (ref 12.0–15.0)
MCH: 31.2 pg (ref 26.0–34.0)
MCHC: 32.6 g/dL (ref 30.0–36.0)
MCV: 95.5 fL (ref 80.0–100.0)
Platelets: 152 10*3/uL (ref 150–400)
RBC: 3.98 MIL/uL (ref 3.87–5.11)
RDW: 14.6 % (ref 11.5–15.5)
WBC: 6.4 10*3/uL (ref 4.0–10.5)
nRBC: 0 % (ref 0.0–0.2)

## 2024-05-18 LAB — GLUCOSE, CAPILLARY
Glucose-Capillary: 112 mg/dL — ABNORMAL HIGH (ref 70–99)
Glucose-Capillary: 159 mg/dL — ABNORMAL HIGH (ref 70–99)

## 2024-05-18 LAB — HEMOGLOBIN A1C
Hgb A1c MFr Bld: 6 % — ABNORMAL HIGH (ref 4.8–5.6)
Mean Plasma Glucose: 126 mg/dL

## 2024-05-18 LAB — ECHOCARDIOGRAM COMPLETE
AR max vel: 2.06 cm2
AV Peak grad: 8.4 mmHg
Ao pk vel: 1.45 m/s
Area-P 1/2: 3.03 cm2
S' Lateral: 3 cm

## 2024-05-18 MED ORDER — POTASSIUM CHLORIDE CRYS ER 20 MEQ PO TBCR
20.0000 meq | EXTENDED_RELEASE_TABLET | Freq: Once | ORAL | Status: AC
  Administered 2024-05-18: 20 meq via ORAL
  Filled 2024-05-18: qty 1

## 2024-05-18 MED ORDER — SODIUM CHLORIDE 0.9 % IV SOLN: INTRAVENOUS | Status: DC

## 2024-05-18 NOTE — Evaluation (Signed)
 Occupational Therapy Evaluation Patient Details Name: Shelby [name] MRN: [id number] DOB: 12/28/1940 Today's Date: 05/18/2024   History of Present Illness   Pt is an 83 y.o. female who presents to the ED with a witnessed syncopal episode that could be result of vasovagal syncope, seizure, TIA/stroke, or PE. All imaging negative. PMH of HTN, HLD, DM, HFpEF, dementia, CKD 3B, anemia.     Clinical Impressions Pt was seen for OT evaluation this date. PTA, pt lives in a 1 level home alone with a ramp to enter. Her niece stays with her most of the day and pt is left alone at night. Reports her son visits often from out of town and may be moving to be closer to her. Pt is MOD I with ADLs and niece assists with all IADLs and meds. Pt reports ambulating household distances with her QC and typically staying in the car when going out.  Pt presents to acute OT demonstrating impaired ADL performance and functional mobility 2/2 weakness and low activity tolerance. Pt currently requires MOD I for bed mobility, SBA/supervision for STS and mobility with in the room  Pt with no LOB while up ambulating and able to perform dynamic standing tasks reaching for her bags to retrieve pants and don them seated EOB and via STS with supervision. Pt removed from 02 and sp02 98-100%. Orthostatic vitals taken during session:   BP- Lying BP- Sitting BP- Standing at 0 minutes BP-standing at 3 min BP after ambulation  05/18/24 0947 99/50 113/59 90/54 95/56  107/66     Pt was not symptomatic and reports she is at/near her baseline and ready to return home. Will follow acutely to maximize her strength and endurance to prevent further decline upon DC home. No OT follow up on DC.     If plan is discharge home, recommend the following:   A little help with walking and/or transfers;Help with stairs or ramp for entrance;Assistance with cooking/housework     Functional Status Assessment   Patient has had a recent  decline in their functional status and demonstrates the ability to make significant improvements in function in a reasonable and predictable amount of time.     Equipment Recommendations   None recommended by OT     Recommendations for Other Services         Precautions/Restrictions   Precautions Precautions: Fall (low fall) Recall of Precautions/Restrictions: Intact Restrictions Weight Bearing Restrictions Per Provider Order: No     Mobility Bed Mobility Overal bed mobility: Modified Independent                  Transfers Overall transfer level: Modified independent Equipment used: Quad cane               General transfer comment: SBA for STS from EOB to QC, able to stand extended time for BP check and ambulated around the room x2 without LOB and SBA      Balance Overall balance assessment: Needs assistance Sitting-balance support: Feet supported Sitting balance-Leahy Scale: Normal     Standing balance support: Single extremity supported, During functional activity Standing balance-Leahy Scale: Good Standing balance comment: no LOB donning pants or ambulating with her QC                           ADL either performed or assessed with clinical judgement   ADL Overall ADL's : Needs assistance/impaired  Lower Body Dressing: Supervision/safety;Sitting/lateral leans;Sit to/from stand   Toilet Transfer: Radiographer, therapeutic Details (indicate cue type and reason): simulated in room         Functional mobility during ADLs: Supervision/safety;Cane       Vision         Perception         Praxis         Pertinent Vitals/Pain Pain Assessment Pain Assessment: No/denies pain     Extremity/Trunk Assessment Upper Extremity Assessment Upper Extremity Assessment: Overall WFL for tasks assessed   Lower Extremity Assessment Lower Extremity Assessment: Overall WFL for tasks assessed        Communication Communication Communication: No apparent difficulties   Cognition Arousal: Alert Behavior During Therapy: WFL for tasks assessed/performed Cognition: Cognition impaired   Orientation impairments: Time, Situation         OT - Cognition Comments: oriented to person and place, states it is April, unsure of the year                 Following commands: Intact       Cueing  General Comments   Cueing Techniques: Verbal cues  no reports of dizziness   Exercises Other Exercises Other Exercises: Edu on role of OT in acute setting.   Shoulder Instructions      Home Living Family/patient expects to be discharged to:: Private residence Living Arrangements: Other relatives Available Help at Discharge: Family;Available PRN/intermittently (niece stays with her most of the day) Type of Home: House Home Access: Ramped entrance     Home Layout: One level     Bathroom Shower/Tub: Producer, television/film/video: Handicapped height     Home Equipment: Cane - quad;Shower seat;Grab bars - toilet;Grab bars - tub/shower;Toilet riser;Hand held shower head;BSC/3in1   Additional Comments: reports son lives in [place] and comes in often; reports niece stays with her most of the day and pt is alone at night      Prior Functioning/Environment Prior Level of Function : Independent/Modified Independent             Mobility Comments: ambulates with quad cane; denies falls; reports when she goes out she mostly sits in the car ADLs Comments: MOD I ADLs, niece manages household chores and her meds; keeps [facility] in her room    OT Problem List: Decreased strength;Decreased activity tolerance   OT Treatment/Interventions: Self-care/ADL training;Therapeutic exercise;Therapeutic activities;Energy conservation;DME and/or AE instruction;Balance training;Patient/family education      OT Goals(Current goals can be found in the care plan section)   Acute Rehab OT  Goals Patient Stated Goal: return home OT Goal Formulation: With patient Time For Goal Achievement: 06/01/24 Potential to Achieve Goals: Good ADL Goals Pt Will Perform Lower Body Bathing: with modified independence;sit to/from stand;sitting/lateral leans Pt Will Perform Lower Body Dressing: with modified independence;sitting/lateral leans;sit to/from stand Pt Will Transfer to Toilet: with modified independence;ambulating;regular height toilet;grab bars   OT Frequency:  Min 1X/week    Co-evaluation              AM-PAC OT 6 Clicks Daily Activity     Outcome Measure Help from another person eating meals?: None Help from another person taking care of personal grooming?: None Help from another person toileting, which includes using toliet, bedpan, or urinal?: A Little Help from another person bathing (including washing, rinsing, drying)?: A Little Help from another person to put on and taking off regular upper body clothing?: None Help from another person to  put on and taking off regular lower body clothing?: A Little 6 Click Score: 21   End of Session Equipment Utilized During Treatment:  (quad cane)  Activity Tolerance: Patient tolerated treatment well Patient left: in bed;with call bell/phone within reach (handoff to PT)  OT Visit Diagnosis: Other abnormalities of gait and mobility (R26.89)                Time: 0900-0930 OT Time Calculation (min): 30 min Charges:  OT General Charges $OT Visit: 1 Visit OT Evaluation $OT Eval Low Complexity: 1 Low OT Treatments $Self Care/Home Management : 8-22 mins Carly Applegate, OTR/L 05/18/24, 9:57 AM  Leonard Raker 05/18/2024, 9:53 AM

## 2024-05-18 NOTE — Evaluation (Signed)
 Physical Therapy Evaluation Patient Details Name: Shelby [name] MRN: [id number] DOB: 1941-01-27 Today's Date: 05/18/2024  History of Present Illness  Pt is an 83 y.o. female who presents to the ED with a witnessed syncopal episode that could be result of vasovagal syncope, seizure, TIA/stroke, or PE. All imaging negative. PMH of HTN, HLD, DM, HFpEF, dementia, CKD 3B, anemia.  Clinical Impression  Patient received sitting on bed. She is able to stand with supervision and ambulated ~60 feet with quad cane and supervision. Patient reports she is feeling about at her baseline and wants to go home. She has no acute concerns at this time and no skilled PT needs identified. PT signing off.          If plan is discharge home, recommend the following: Help with stairs or ramp for entrance;Assist for transportation   Can travel by private vehicle    yes    Equipment Recommendations None recommended by PT  Recommendations for Other Services       Functional Status Assessment Patient has not had a recent decline in their functional status     Precautions / Restrictions Precautions Precautions: Fall Recall of Precautions/Restrictions: Intact Restrictions Weight Bearing Restrictions Per Provider Order: No      Mobility  Bed Mobility Overal bed mobility: Modified Independent                  Transfers Overall transfer level: Modified independent Equipment used: Quad cane               General transfer comment: supervision    Ambulation/Gait Ambulation/Gait assistance: Supervision Gait Distance (Feet): 60 Feet Assistive device: Quad cane Gait Pattern/deviations: Step-through pattern, Decreased step length - right, Decreased step length - left, Decreased stride length Gait velocity: decr     General Gait Details: patient reports she feels back to baseline. Does not ambulate long distances. She required supervision only, no LOB  Stairs             Wheelchair Mobility     Tilt Bed    Modified Rankin (Stroke Patients Only)       Balance Overall balance assessment: Modified Independent Sitting-balance support: Feet supported Sitting balance-Leahy Scale: Normal     Standing balance support: Single extremity supported, During functional activity Standing balance-Leahy Scale: Good Standing balance comment: no LOB donning pants or ambulating with her QC                             Pertinent Vitals/Pain Pain Assessment Pain Assessment: No/denies pain    Home Living Family/patient expects to be discharged to:: Private residence Living Arrangements: Other relatives Available Help at Discharge: Family;Available PRN/intermittently Type of Home: House Home Access: Ramped entrance       Home Layout: One level Home Equipment: Cane - quad;Shower seat;Grab bars - toilet;Grab bars - tub/shower;Toilet riser;Hand held shower head;BSC/3in1 Additional Comments: reports son lives in [place] and comes in often; reports niece stays with her most of the day and pt is alone at night    Prior Function Prior Level of Function : Independent/Modified Independent             Mobility Comments: ambulates with quad cane; denies falls; reports when she goes out she mostly sits in the car ADLs Comments: MOD I ADLs, niece manages household chores and her meds; keeps [facility] in her room     Extremity/Trunk Assessment   Upper Extremity Assessment  Upper Extremity Assessment: Defer to OT evaluation    Lower Extremity Assessment Lower Extremity Assessment: Overall WFL for tasks assessed    Cervical / Trunk Assessment Cervical / Trunk Assessment: Normal  Communication   Communication Communication: No apparent difficulties    Cognition Arousal: Alert Behavior During Therapy: WFL for tasks assessed/performed   PT - Cognitive impairments: No apparent impairments                         Following commands:  Intact       Cueing Cueing Techniques: Verbal cues     General Comments General comments (skin integrity, edema, etc.): no reports of dizziness    Exercises     Assessment/Plan    PT Assessment Patient does not need any further PT services  PT Problem List         PT Treatment Interventions      PT Goals (Current goals can be found in the Care Plan section)  Acute Rehab PT Goals Patient Stated Goal: return home PT Goal Formulation: With patient Time For Goal Achievement: 05/21/24 Potential to Achieve Goals: Good    Frequency       Co-evaluation               AM-PAC PT 6 Clicks Mobility  Outcome Measure Help needed turning from your back to your side while in a flat bed without using bedrails?: None Help needed moving from lying on your back to sitting on the side of a flat bed without using bedrails?: None Help needed moving to and from a bed to a chair (including a wheelchair)?: None Help needed standing up from a chair using your arms (e.g., wheelchair or bedside chair)?: None Help needed to walk in hospital room?: None Help needed climbing 3-5 steps with a railing? : A Little 6 Click Score: 23    End of Session   Activity Tolerance: Patient tolerated treatment well Patient left: in chair;with call bell/phone within reach Nurse Communication: Mobility status      Time: 1610-9604 PT Time Calculation (min) (ACUTE ONLY): 8 min   Charges:   PT Evaluation $PT Eval Low Complexity: 1 Low   PT General Charges $$ ACUTE PT VISIT: 1 Visit         Zayd Bonet, PT, GCS 05/18/24,10:59 AM

## 2024-05-18 NOTE — Progress Notes (Signed)
 Eeg done

## 2024-05-18 NOTE — Progress Notes (Addendum)
 PROGRESS NOTE    Shelby [name]  ZOX:[id number] DOB: Dec 11, 1940 DOA: 05/17/2024 PCP: Efraim Grange, NP   Assessment & Plan:   Active Problems:   Syncope   Hypotension   Acute renal failure superimposed on stage 3b chronic kidney disease (HCC)   Chronic diastolic CHF (congestive heart failure) (HCC)   Type II diabetes mellitus with renal manifestations (HCC)   Dementia without behavioral disturbance (HCC)  Assessment and Plan: Syncope: etiology unclear, possibly secondary to orthostatic hypotension. Will start w/ TED hose. CT head negative. Echo ordered. Continue on IVFs. Saturating 90s on RA   Hypotension: WNL currently. Will continue to monitor closely. Continue on IVFs   AKI on CKDIIIb: Cr is trending down from day prior    HLD: continue on statin    Chronic diastolic CHF: 2D echo 07/25/2019 showed EF of 55-60%. Appears compensated. Monitor I/Os. Will monitor closely while on IVFs  Hypokalemia: potassium given   DM2: likely poorly controlled. Will check HbA1c. Continue on SSI w/ accuchecks    Dementia: no formal dx of dementia as per pt's son but pt's son does believe the pt has dementia. Continue on home dose of donepezil . Pt still drives but told the pt is not a good/safe idea to drive w/ likely poor memory    Obesity: BMI 36.13. Would benefit from weight loss          DVT prophylaxis: lovenox   Code Status: full  Family Communication: discussed pt's care w/ pt's son at bedside and answered his questions  Disposition Plan: likely d/c back home w/ family care  Level of care: Progressive Consultants:    Procedures:  Antimicrobials:    Subjective: Pt c/o fatigue   Objective: Vitals:   05/18/24 0600 05/18/24 0630 05/18/24 0800 05/18/24 0949  BP: (!) 112/58 111/60 (!) 143/62 107/66  Pulse: (!) 54 (!) 54 65   Resp: 14 13 (!) 21   Temp:   98.1 F (36.7 C)   TempSrc:   Oral   SpO2: 95% 95% 100%    No intake or output data in the 24 hours  ending 05/18/24 1012 There were no vitals filed for this visit.  Examination:  General exam: Appears calm and comfortable  Respiratory system: Clear to auscultation. Respiratory effort normal. Cardiovascular system: S1 & S2 +. No  rubs, gallops or clicks.  Gastrointestinal system: Abdomen is nondistended, soft and nontender. Normal bowel sounds heard. Central nervous system: Alert and awake. Moves all extremities  Psychiatry: Judgement and insight appears poor. Flat mood and affect    Data Reviewed: I have personally reviewed following labs and imaging studies  CBC: Recent Labs  Lab 05/17/24 1433 05/18/24 0438  WBC 6.6 6.4  NEUTROABS 4.1  --   HGB 14.0 12.4  HCT 43.4 38.0  MCV 94.8 95.5  PLT 180 152   Basic Metabolic Panel: Recent Labs  Lab 05/17/24 1433 05/18/24 0438  NA 141 143  K 3.7 3.3*  CL 100 106  CO2 30 31  GLUCOSE 119* 99  BUN 35* 40*  CREATININE 2.41* 2.04*  CALCIUM  11.7* 10.8*   GFR: CrCl cannot be calculated (Unknown ideal weight.). Liver Function Tests: Recent Labs  Lab 05/17/24 1433  AST 28  ALT 17  ALKPHOS 50  BILITOT 0.9  PROT 7.5  ALBUMIN  4.1   No results for input(s): LIPASE, AMYLASE in the last 168 hours. No results for input(s): AMMONIA in the last 168 hours. Coagulation Profile: No results for input(s): INR, PROTIME in  the last 168 hours. Cardiac Enzymes: No results for input(s): CKTOTAL, CKMB, CKMBINDEX, TROPONINI in the last 168 hours. BNP (last 3 results) No results for input(s): PROBNP in the last 8760 hours. HbA1C: No results for input(s): HGBA1C in the last 72 hours. CBG: Recent Labs  Lab 05/17/24 2115 05/18/24 0437 05/18/24 0901  GLUCAP 114* 98 132*   Lipid Profile: No results for input(s): CHOL, HDL, LDLCALC, TRIG, CHOLHDL, LDLDIRECT in the last 72 hours. Thyroid  Function Tests: No results for input(s): TSH, T4TOTAL, FREET4, T3FREE, THYROIDAB in the last 72  hours. Anemia Panel: No results for input(s): VITAMINB12, FOLATE, FERRITIN, TIBC, IRON, RETICCTPCT in the last 72 hours. Sepsis Labs: Recent Labs  Lab 05/17/24 1502  LATICACIDVEN 1.1    Recent Results (from the past 240 hours)  Culture, blood (routine x 2)     Status: None (Preliminary result)   Collection Time: 05/17/24  3:02 PM   Specimen: BLOOD  Result Value Ref Range Status   Specimen Description BLOOD LEFT ANTECUBITAL  Final   Special Requests   Final    BOTTLES DRAWN AEROBIC ONLY Blood Culture results may not be optimal due to an inadequate volume of blood received in culture bottles   Culture   Final    NO GROWTH < 24 HOURS Performed at [facility], [address], [place], [place] [address]    Report Status PENDING  Incomplete         Radiology Studies: CT HEAD WO CONTRAST ( ) Result Date: 05/17/2024 CLINICAL DATA:  syncope vs seizure, recent fall EXAM: CT HEAD WITHOUT CONTRAST TECHNIQUE: Contiguous axial images were obtained from the base of the skull through the vertex without intravenous contrast. RADIATION DOSE REDUCTION: This exam was performed according to the departmental dose-optimization program which includes automated exposure control, adjustment of the mA and/or kV according to patient size and/or use of iterative reconstruction technique. COMPARISON:  07/01/2023 FINDINGS: Brain: No acute intracranial abnormality. Specifically, no hemorrhage, hydrocephalus, mass lesion, acute infarction, or significant intracranial injury. Vascular: No hyperdense vessel or unexpected calcification. Skull: No acute calvarial abnormality. Sinuses/Orbits: No acute findings Other: None IMPRESSION: No acute intracranial abnormality. Electronically Signed   By: Janeece Mechanic M.D.   On: 05/17/2024 17:33   DG Chest 1 View Result Date: 05/17/2024 CLINICAL DATA:  Hypotensive EXAM: CHEST  1 VIEW COMPARISON:  July 24 24 FINDINGS: The heart size and mediastinal  contours are within normal limits. Both lungs are clear. The visualized skeletal structures are unremarkable. IMPRESSION: No active disease. Electronically Signed   By: Fredrich Jefferson M.D.   On: 05/17/2024 15:19        Scheduled Meds:  donepezil   10 mg Oral QHS   enoxaparin  (LOVENOX ) injection  30 mg Subcutaneous Q24H   insulin aspart  0-5 Units Subcutaneous QHS   insulin aspart  0-9 Units Subcutaneous TID WC   sodium chloride  flush  3 mL Intravenous Q12H   Continuous Infusions:   LOS: 0 days      Alphonsus Jeans, MD [facility] Pager [phone]  If 7PM-7AM, please contact night-coverage [email or web address] 05/18/2024, 10:12 AM

## 2024-05-18 NOTE — Progress Notes (Signed)
 Echocardiogram 2D Echocardiogram has been performed.  Shelby [name] 05/18/2024, 10:35 AM

## 2024-05-19 DIAGNOSIS — R55 Syncope and collapse: Secondary | ICD-10-CM | POA: Diagnosis not present

## 2024-05-19 DIAGNOSIS — R569 Unspecified convulsions: Secondary | ICD-10-CM | POA: Diagnosis not present

## 2024-05-19 DIAGNOSIS — I951 Orthostatic hypotension: Secondary | ICD-10-CM

## 2024-05-19 DIAGNOSIS — I959 Hypotension, unspecified: Secondary | ICD-10-CM | POA: Diagnosis not present

## 2024-05-19 LAB — GLUCOSE, CAPILLARY
Glucose-Capillary: 128 mg/dL — ABNORMAL HIGH (ref 70–99)
Glucose-Capillary: 120 mg/dL — ABNORMAL HIGH (ref 70–99)
Glucose-Capillary: 138 mg/dL — ABNORMAL HIGH (ref 70–99)

## 2024-05-19 LAB — MAGNESIUM: Magnesium: 1.7 mg/dL (ref 1.7–2.4)

## 2024-05-19 LAB — BASIC METABOLIC PANEL WITH GFR
Anion gap: 7 (ref 5–15)
BUN: 35 mg/dL — ABNORMAL HIGH (ref 8–23)
CO2: 27 mmol/L (ref 22–32)
Calcium: 10.9 mg/dL — ABNORMAL HIGH (ref 8.9–10.3)
Chloride: 107 mmol/L (ref 98–111)
Creatinine, Ser: 1.38 mg/dL — ABNORMAL HIGH (ref 0.44–1.00)
GFR, Estimated: 38 mL/min — ABNORMAL LOW (ref >60)
Glucose, Bld: 140 mg/dL — ABNORMAL HIGH (ref 70–99)
Potassium: 3.9 mmol/L (ref 3.5–5.1)
Sodium: 141 mmol/L (ref 135–145)

## 2024-05-19 MED ORDER — CARVEDILOL 25 MG PO TABS
25.0000 mg | ORAL_TABLET | ORAL | Status: AC
  Administered 2024-05-19: 25 mg via ORAL
  Filled 2024-05-19: qty 1

## 2024-05-19 NOTE — TOC CM/SW Note (Signed)
 Transition of Care [facility]) - Inpatient Brief Assessment   Patient Details  Name: Shelby [name] MRN: [id number] Date of Birth: 23-Nov-1941  Transition of [facility]) CM/SW Contact:    Odilia Bennett, LCSW Phone Number: 05/19/2024, 2:13 PM   Clinical Narrative: CSW reviewed chart. No TOC needs identified so far. CSW will continue to follow progress. Please place [facility] consult if any needs arise.  Transition of Care Asessment: Insurance and Status: Insurance coverage has been reviewed Patient has primary care physician: Yes Home environment has been reviewed: Single family home Prior level of function:: Modified independent Prior/Current Home Services: No current home services Social Drivers of Health Review: SDOH reviewed no interventions necessary Readmission risk has been reviewed: Yes Transition of care needs: no transition of care needs at this time

## 2024-05-19 NOTE — Discharge Summary (Signed)
 Physician Discharge Summary  Shelby [name] XWR:[id number] DOB: 1941/08/30 DOA: 05/17/2024  PCP: Efraim Grange, NP  Admit date: 05/17/2024 Discharge date: 05/19/2024  Admitted From: home  Disposition:  home   Recommendations for Outpatient Follow-up:  Follow up with PCP in 1-2 weeks F/u w/ cardio, Dr. Beau Bound, in 1-2 weeks  Home Health: no  Equipment/Devices:  Discharge Condition: stable  CODE STATUS: full  Diet recommendation: Heart Healthy / Carb Modified   Brief/Interim Summary: HPI was taken from Dr. Vallarie Gauze: Shelby [name] is a 83 y.o. female with medical history significant for Patient HTN, HLD, DM, HFpEF, dementia, CKD 3B, who presents to the ED with a witnessed syncopal episode.  According to the ED provider, her son noted that she was sleepier than usual during the course of the day and was eating less.  She went outside and he heard a noise and when he went outside he saw her sitting unconscious in a chair drooling.  There was no jerking activity and she was not incontinent.  She aroused after about 5 minutes.    Vitals were reassuring on arrival of EMS however upon evaluation in triage, initial blood pressure was 64/67.   Of note, during her hospitalization a year ago from 7/24, she had reported jerking movements and echocardiogram and EEG were ordered however she signed out AMA after she felt better.  They recommended holding her diuretics.   Labs notable for creatinine of 2.41 up from 1.44 a year ago when she was hospitalized with syncope of unknown unknown etiology.  Labs otherwise unremarkable.  EKG showing sinus at 65 without other abnormal.  CT head nonacute and chest x-ray nonacute.  Patient received 2 L LR bolus.  Admission requested   Discharge Diagnoses:  Principal Problem:   Hypotension Active Problems:   Syncope   Acute renal failure superimposed on stage 3b chronic kidney disease (HCC)   Chronic diastolic CHF (congestive heart failure)  (HCC)   Type II diabetes mellitus with renal manifestations (HCC)   Dementia without behavioral disturbance (HCC)  Syncope: etiology unclear, possibly secondary to orthostatic hypotension. Continue w/ TED hose. CT head negative. Echo showed EF 50-55%, grade I diastolic dysfunction & small pericardial effusion which was read by [facility] cardio and not [facility]. Pt follows w/ Dr. Beau Bound, so pt should f/u w/ Dr. Beau Bound and NOT [facility] cardio. Saturating 90s on RA. Zio patch will be placed by [facility] cardio prior to d/c.    Hypotension: resolved   AKI on CKDIIIb: Cr is trending down again today     HLD: continue on statin    Chronic diastolic CHF: 2D echo 07/25/2019 showed EF of 55-60%. Appears compensated. Monitor I/Os. F/u outpatient w/ [facility], Dr. Beau Bound   Hypokalemia: WNL today    DM2: well controlled, HbA1c 6.0. Continue on SSI w/ accuchecks    Dementia: no formal dx of dementia as per pt's son but pt's son does believe the pt has dementia. Continue on home dose of donepezil . Pt still drives but told the pt is not a good/safe idea to drive w/ likely poor memory    Obesity: BMI 36.13. Would benefit from weight loss   Discharge Instructions  Discharge Instructions     Diet - low sodium heart healthy   Complete by: As directed    Diet Carb Modified   Complete by: As directed    Discharge instructions   Complete by: As directed    A heart monitor will be mailed to  you w/ instructions on how to put it on. You will need to f/u w/ [facility] in 2-3 weeks to follow up on these results. F/u w/ PCP in 1-2 weeks   Discharge instructions   Complete by: As directed    F/u w/ [facility], Dr. Beau Bound, in 1-2 weeks   Increase activity slowly   Complete by: As directed       Allergies as of 05/19/2024       Reactions   Furosemide  Other (See Comments)   Legs swelling        Medication List     TAKE these medications    amLODipine  5 MG tablet Commonly known as: NORVASC  Take 10  mg by mouth daily.   ascorbic acid  500 MG tablet Commonly known as: VITAMIN C  Take 500 mg by mouth daily.   aspirin EC 81 MG tablet Take 81 mg by mouth daily.   atorvastatin  80 MG tablet Commonly known as: LIPITOR Take 80 mg by mouth daily.   brimonidine 0.2 % ophthalmic solution Commonly known as: ALPHAGAN Place 1 drop into both eyes 2 (two) times daily.   carvedilol  25 MG tablet Commonly known as: COREG  Take by mouth 2 (two) times daily with a meal.   donepezil  10 MG tablet Commonly known as: ARICEPT  Take 10 mg by mouth at bedtime.   Farxiga 10 MG Tabs tablet Generic drug: dapagliflozin propanediol Take 1 tablet by mouth daily.   ferrous sulfate  325 (65 FE) MG tablet Take by mouth. Takes one pill every 2-3 days to minimize constipation   Fish Oil 1000 MG Caps Take 1,000 mg by mouth daily.   gabapentin  300 MG capsule Commonly known as: NEURONTIN  Take 300 mg by mouth 3 (three) times daily.   hydrochlorothiazide 12.5 MG capsule Commonly known as: MICROZIDE Take 12.5 mg by mouth daily.   magnesium  oxide 400 MG tablet Commonly known as: MAG-OX Take 400 mg by mouth daily.   mirabegron  ER 50 MG Tb24 tablet Commonly known as: MYRBETRIQ  Take 1 tablet (50 mg total) by mouth daily.   Multi-Vitamins Tabs Take 1 tablet by mouth daily.   torsemide  20 MG tablet Commonly known as: DEMADEX  Take 1 tablet (20 mg total) by mouth daily.   travoprost  (benzalkonium) 0.004 % ophthalmic solution Commonly known as: TRAVATAN  Place 1 drop into both eyes at bedtime.   traZODone 50 MG tablet Commonly known as: DESYREL Take 1 tablet by mouth at bedtime.        Allergies  Allergen Reactions   Furosemide  Other (See Comments)    Legs swelling     Consultations:   Procedures/Studies: ECHOCARDIOGRAM COMPLETE Result Date: 05/18/2024    ECHOCARDIOGRAM REPORT   Patient Name:   Shelby [name] Date of Exam: 05/18/2024 Medical Rec #:  [id number]               Height:        60.0 in Accession #:    [id number]              Weight:       185.0 lb Date of Birth:  04-16-1941               BSA:          1.806 m Patient Age:    83 years                BP:           111/60 mmHg Patient Gender: F  HR:           56 bpm. Exam Location:  [facility] Procedure: 2D Echo, Cardiac Doppler and Color Doppler (Both Spectral and Color            Flow Doppler were utilized during procedure). Indications:     Syncope R55  History:         Patient has prior history of Echocardiogram examinations, most                  recent 07/26/2019. CHF and Cardiomyopathy, CKD, stage 3,                  Signs/Symptoms:Hypotension, Shortness of Breath and Syncope;                  Risk Factors:Hypertension, Sleep Apnea, Diabetes and                  Dyslipidemia.  Sonographer:     Terrilee Few RCS Referring Phys:  [id number] Lanetta Pion Diagnosing Phys: Sammy Crisp MD IMPRESSIONS  1. Left ventricular ejection fraction, by estimation, is 50 to 55%. The left ventricle has low normal function. The left ventricle has no regional wall motion abnormalities. There is mild left ventricular hypertrophy. Left ventricular diastolic parameters are consistent with Grade I diastolic dysfunction (impaired relaxation).  2. Right ventricular systolic function is mildly reduced. The right ventricular size is normal. There is normal pulmonary artery systolic pressure.  3. A small pericardial effusion is present. The pericardial effusion is anterior to the right ventricle. There is no evidence of cardiac tamponade.  4. The mitral valve is normal in structure. Trivial mitral valve regurgitation. No evidence of mitral stenosis.  5. The aortic valve is tricuspid. There is mild thickening of the aortic valve. Aortic valve regurgitation is not visualized. Aortic valve sclerosis is present, with no evidence of aortic valve stenosis.  6. The inferior vena cava is normal in size with greater than 50% respiratory  variability, suggesting right atrial pressure of 3 mmHg. FINDINGS  Left Ventricle: Left ventricular ejection fraction, by estimation, is 50 to 55%. The left ventricle has low normal function. The left ventricle has no regional wall motion abnormalities. The left ventricular internal cavity size was normal in size. There is mild left ventricular hypertrophy. Left ventricular diastolic parameters are consistent with Grade I diastolic dysfunction (impaired relaxation). Right Ventricle: The right ventricular size is normal. No increase in right ventricular wall thickness. Right ventricular systolic function is mildly reduced. There is normal pulmonary artery systolic pressure. The tricuspid regurgitant velocity is 2.07 m/s, and with an assumed right atrial pressure of 3 mmHg, the estimated right ventricular systolic pressure is 20.1 mmHg. Left Atrium: Left atrial size was normal in size. Right Atrium: Right atrial size was normal in size. Pericardium: A small pericardial effusion is present. The pericardial effusion is anterior to the right ventricle. There is no evidence of cardiac tamponade. Mitral Valve: The mitral valve is normal in structure. Trivial mitral valve regurgitation. No evidence of mitral valve stenosis. Tricuspid Valve: The tricuspid valve is normal in structure. Tricuspid valve regurgitation is mild. Aortic Valve: The aortic valve is tricuspid. There is mild thickening of the aortic valve. Aortic valve regurgitation is not visualized. Aortic valve sclerosis is present, with no evidence of aortic valve stenosis. Aortic valve peak gradient measures 8.4  mmHg. Pulmonic Valve: The pulmonic valve was normal in structure. Pulmonic valve regurgitation is trivial. No evidence of pulmonic stenosis. Aorta: The  aortic root and ascending aorta are structurally normal, with no evidence of dilitation. Pulmonary Artery: The pulmonary artery is not well seen. Venous: The inferior vena cava is normal in size with  greater than 50% respiratory variability, suggesting right atrial pressure of 3 mmHg. IAS/Shunts: The interatrial septum was not well visualized.  LEFT VENTRICLE PLAX 2D LVIDd:         4.10 cm   Diastology LVIDs:         3.00 cm   LV e' medial:    6.74 cm/s LV PW:         1.30 cm   LV E/e' medial:  8.2 LV IVS:        1.20 cm   LV e' lateral:   6.64 cm/s LVOT diam:     2.00 cm   LV E/e' lateral: 8.3 LV SV:         60 LV SV Index:   33 LVOT Area:     3.14 cm  RIGHT VENTRICLE             IVC RV S prime:     10.10 cm/s  IVC diam: 1.30 cm TAPSE (M-mode): 1.6 cm LEFT ATRIUM             Index        RIGHT ATRIUM           Index LA diam:        3.60 cm 1.99 cm/m   RA Area:     10.60 cm LA Vol (A2C):   36.4 ml 20.16 ml/m  RA Volume:   24.90 ml  13.79 ml/m LA Vol (A4C):   30.7 ml 17.00 ml/m LA Biplane Vol: 34.0 ml 18.83 ml/m  AORTIC VALVE AV Area (Vmax): 2.06 cm AV Vmax:        145.00 cm/s AV Peak Grad:   8.4 mmHg LVOT Vmax:      94.97 cm/s LVOT Vmean:     59.933 cm/s LVOT VTI:       0.191 m  AORTA Ao Root diam: 2.80 cm Ao Asc diam:  3.10 cm MITRAL VALVE               TRICUSPID VALVE MV Area (PHT): 3.03 cm    TR Peak grad:   17.1 mmHg MV Decel Time: 250 msec    TR Vmax:        207.00 cm/s MV E velocity: 55.00 cm/s MV A velocity: 81.80 cm/s  SHUNTS MV E/A ratio:  0.67        Systemic VTI:  0.19 m                            Systemic Diam: 2.00 cm Sammy Crisp MD Electronically signed by Sammy Crisp MD Signature Date/Time: 05/18/2024/11:46:07 AM    Final    CT HEAD WO CONTRAST ( ) Result Date: 05/17/2024 CLINICAL DATA:  syncope vs seizure, recent fall EXAM: CT HEAD WITHOUT CONTRAST TECHNIQUE: Contiguous axial images were obtained from the base of the skull through the vertex without intravenous contrast. RADIATION DOSE REDUCTION: This exam was performed according to the departmental dose-optimization program which includes automated exposure control, adjustment of the mA and/or kV according to patient size  and/or use of iterative reconstruction technique. COMPARISON:  07/01/2023 FINDINGS: Brain: No acute intracranial abnormality. Specifically, no hemorrhage, hydrocephalus, mass lesion, acute infarction, or significant intracranial injury. Vascular: No hyperdense vessel or unexpected calcification. Skull: No  acute calvarial abnormality. Sinuses/Orbits: No acute findings Other: None IMPRESSION: No acute intracranial abnormality. Electronically Signed   By: Janeece Mechanic M.D.   On: 05/17/2024 17:33   DG Chest 1 View Result Date: 05/17/2024 CLINICAL DATA:  Hypotensive EXAM: CHEST  1 VIEW COMPARISON:  July 24 24 FINDINGS: The heart size and mediastinal contours are within normal limits. Both lungs are clear. The visualized skeletal structures are unremarkable. IMPRESSION: No active disease. Electronically Signed   By: Fredrich Jefferson M.D.   On: 05/17/2024 15:19   (Echo, Carotid, EGD, Colonoscopy, ERCP)    Subjective:   Discharge Exam: Vitals:   05/19/24 0746 05/19/24 1113  BP: (!) 153/77 (!) 175/75  Pulse: 63 64  Resp: 18 18  Temp: 98.2 F (36.8 C) 98.5 F (36.9 C)  SpO2: 100% 100%   Vitals:   05/19/24 0448 05/19/24 0500 05/19/24 0746 05/19/24 1113  BP: (!) 158/70  (!) 153/77 (!) 175/75  Pulse: 62  63 64  Resp: 18  18 18   Temp: 98.2 F (36.8 C)  98.2 F (36.8 C) 98.5 F (36.9 C)  TempSrc: Oral     SpO2: 94%  100% 100%  Weight:  84 kg      General: Pt is alert, awake, not in acute distress Cardiovascular:  S1/S2 +, no rubs, no gallops Respiratory: CTA bilaterally, no wheezing, no rhonchi Abdominal: Soft, NT, obese, bowel sounds + Extremities: no cyanosis    The results of significant diagnostics from this hospitalization (including imaging, microbiology, ancillary and laboratory) are listed below for reference.     Microbiology: Recent Results (from the past 240 hours)  Culture, blood (routine x 2)     Status: None (Preliminary result)   Collection Time: 05/17/24  3:02 PM    Specimen: BLOOD  Result Value Ref Range Status   Specimen Description BLOOD LEFT ANTECUBITAL  Final   Special Requests   Final    BOTTLES DRAWN AEROBIC ONLY Blood Culture results may not be optimal due to an inadequate volume of blood received in culture bottles   Culture   Final    NO GROWTH 2 DAYS Performed at [facility], [address] [place], [place] [address]    Report Status PENDING  Incomplete     Labs: BNP (last 3 results) Recent Labs    07/01/23 0930  BNP 11.9   Basic Metabolic Panel: Recent Labs  Lab 05/17/24 1433 05/18/24 0438 05/19/24 0441  NA 141 143 141  K 3.7 3.3* 3.9  CL 100 106 107  CO2 30 31 27   GLUCOSE 119* 99 140*  BUN 35* 40* 35*  CREATININE 2.41* 2.04* 1.38*  CALCIUM  11.7* 10.8* 10.9*  MG  --   --  1.7   Liver Function Tests: Recent Labs  Lab 05/17/24 1433  AST 28  ALT 17  ALKPHOS 50  BILITOT 0.9  PROT 7.5  ALBUMIN  4.1   No results for input(s): LIPASE, AMYLASE in the last 168 hours. No results for input(s): AMMONIA in the last 168 hours. CBC: Recent Labs  Lab 05/17/24 1433 05/18/24 0438  WBC 6.6 6.4  NEUTROABS 4.1  --   HGB 14.0 12.4  HCT 43.4 38.0  MCV 94.8 95.5  PLT 180 152   Cardiac Enzymes: No results for input(s): CKTOTAL, CKMB, CKMBINDEX, TROPONINI in the last 168 hours. BNP: Invalid input(s): POCBNP CBG: Recent Labs  Lab 05/18/24 1754 05/18/24 2031 05/19/24 0448 05/19/24 0748 05/19/24 1158  GLUCAP 112* 159* 128* 120* 138*  D-Dimer No results for input(s): DDIMER in the last 72 hours. Hgb A1c Recent Labs    05/17/24 2106  HGBA1C 6.0*   Lipid Profile No results for input(s): CHOL, HDL, LDLCALC, TRIG, CHOLHDL, LDLDIRECT in the last 72 hours. Thyroid  function studies No results for input(s): TSH, T4TOTAL, T3FREE, THYROIDAB in the last 72 hours.  Invalid input(s): FREET3 Anemia work up No results for input(s): VITAMINB12, FOLATE, FERRITIN,  TIBC, IRON, RETICCTPCT in the last 72 hours. Urinalysis    Component Value Date/Time   COLORURINE STRAW (A) 05/17/2024 2015   APPEARANCEUR CLEAR (A) 05/17/2024 2015   APPEARANCEUR Clear 02/16/2018 0910   LABSPEC 1.010 05/17/2024 2015   PHURINE 5.0 05/17/2024 2015   GLUCOSEU NEGATIVE 05/17/2024 2015   HGBUR NEGATIVE 05/17/2024 2015   BILIRUBINUR NEGATIVE 05/17/2024 2015   BILIRUBINUR Negative 02/16/2018 0910   KETONESUR NEGATIVE 05/17/2024 2015   PROTEINUR NEGATIVE 05/17/2024 2015   NITRITE NEGATIVE 05/17/2024 2015   LEUKOCYTESUR NEGATIVE 05/17/2024 2015   Sepsis Labs Recent Labs  Lab 05/17/24 1433 05/18/24 0438  WBC 6.6 6.4   Microbiology Recent Results (from the past 240 hours)  Culture, blood (routine x 2)     Status: None (Preliminary result)   Collection Time: 05/17/24  3:02 PM   Specimen: BLOOD  Result Value Ref Range Status   Specimen Description BLOOD LEFT ANTECUBITAL  Final   Special Requests   Final    BOTTLES DRAWN AEROBIC ONLY Blood Culture results may not be optimal due to an inadequate volume of blood received in culture bottles   Culture   Final    NO GROWTH 2 DAYS Performed at [facility], [address], [place], [place] [address]    Report Status PENDING  Incomplete     Time coordinating discharge: Over 30 minutes  SIGNED:   Alphonsus Jeans, MD  [facility] 05/19/2024, 3:20 PM Pager   If 7PM-7AM, please contact night-coverage [email or web address]

## 2024-05-19 NOTE — Care Management Obs Status (Signed)
 MEDICARE OBSERVATION STATUS NOTIFICATION   Patient Details  Name: Shelby [name] MRN: [id number] Date of Birth: [birth year]   Medicare Observation Status Notification Given:  No (patient did not want a copy)    Anise Kerns 05/19/2024, 10:08 AM

## 2024-05-20 NOTE — Discharge Summary (Signed)
 D/c summary was miss labeled as progress note on 05/19/24. Please see that note for full d/c summary

## 2024-05-22 LAB — CULTURE, BLOOD (ROUTINE X 2): Culture: NO GROWTH

## 2024-05-30 ENCOUNTER — Encounter: Payer: Self-pay | Admitting: Emergency Medicine

## 2024-05-30 ENCOUNTER — Ambulatory Visit
Admission: EM | Admit: 2024-05-30 | Discharge: 2024-05-30 | Disposition: A | Discharge status: Home / Self Care | Attending: Physician Assistant | Admitting: Physician Assistant

## 2024-05-30 DIAGNOSIS — R296 Repeated falls: Secondary | ICD-10-CM | POA: Diagnosis not present

## 2024-05-30 DIAGNOSIS — R410 Disorientation, unspecified: Secondary | ICD-10-CM | POA: Insufficient documentation

## 2024-05-30 DIAGNOSIS — R443 Hallucinations, unspecified: Secondary | ICD-10-CM | POA: Insufficient documentation

## 2024-05-30 DIAGNOSIS — F0390 Unspecified dementia without behavioral disturbance: Secondary | ICD-10-CM | POA: Insufficient documentation

## 2024-05-30 DIAGNOSIS — N3000 Acute cystitis without hematuria: Secondary | ICD-10-CM | POA: Insufficient documentation

## 2024-05-30 LAB — URINALYSIS, W/ REFLEX TO CULTURE (INFECTION SUSPECTED)
Bilirubin Urine: NEGATIVE
Glucose, UA: NEGATIVE mg/dL
Hgb urine dipstick: NEGATIVE
Ketones, ur: NEGATIVE mg/dL
Leukocytes,Ua: NEGATIVE
Nitrite: POSITIVE — AB
Protein, ur: NEGATIVE mg/dL
Specific Gravity, Urine: 1.02 (ref 1.005–1.030)
pH: 5.5 (ref 5.0–8.0)

## 2024-05-30 LAB — GLUCOSE, CAPILLARY: Glucose-Capillary: 194 mg/dL — ABNORMAL HIGH (ref 70–99)

## 2024-05-30 MED ORDER — CEFDINIR 300 MG PO CAPS: 300.0000 mg | ORAL_CAPSULE | Freq: Two times a day (BID) | ORAL | 0 refills | Status: AC

## 2024-05-30 NOTE — Discharge Instructions (Addendum)
-  Possible urinary tract infection. I sent urine for culture and it will be back in 2 days. If culture is negative or symptoms not improving in 2 days take to ER for further work up -If any acute worsening of symptoms --fever, more falls, passing out, chest pain, breathing trouble, not urinating, blood in urine, vomiting, increased weakness take to ER.  -Increase fluids and rest. Be careful with position changes and use cane to walk. -Follow up with PCP and cardiology as scheduled

## 2024-05-30 NOTE — Procedures (Signed)
 Patient Name: Shelby [name]  MRN: [id number]  Epilepsy Attending: Arlin MALVA Krebs  Referring Physician/Provider: Cleatus Delayne GAILS, MD  Date: 05/18/2024 Duration: 26.17 mins  Patient history: 83 y.o. female with medical history significant for Patient HTN, HLD, DM, HFpEF, dementia, CKD 3B, who presents to the ED with a witnessed syncopal episode.  EEG to evaluate for seizure  Level of alertness: Awake  AEDs during EEG study: None  Technical aspects: This EEG study was done with scalp electrodes positioned according to the 10-20 International system of electrode placement. Electrical activity was reviewed with band pass filter of 1-70Hz , sensitivity of 7 uV/mm, display speed of 42mm/sec with a 60Hz  notched filter applied as appropriate. EEG data were recorded continuously and digitally stored.  Video monitoring was available and reviewed as appropriate.  Description: The posterior dominant rhythm consists of 7.5 Hz activity of moderate voltage (25-35 uV) seen predominantly in posterior head regions, symmetric and reactive to eye opening and eye closing. EEG showed intermittent generalized 3 to 6 Hz theta-delta slowing. Hyperventilation and photic stimulation were not performed.     ABNORMALITY - Intermittent slow, generalized  IMPRESSION: This study is suggestive of mild diffuse encephalopathy. No seizures or epileptiform discharges were seen throughout the recording.   Deona Novitski O Elica Almas

## 2024-05-30 NOTE — ED Triage Notes (Signed)
 Pt presents with her niece who states for the past few days she has been hearing and seeing things. She has also fallen a lot with o known cause.

## 2024-05-30 NOTE — ED Provider Notes (Signed)
 [facility]    CSN: [id number] Arrival date & time: 05/30/24  9182      History   Chief Complaint Chief Complaint  Patient presents with   Hallucinations   Fall    HPI Shelby [name] is a 83 y.o. female with history of cardiomyopathy, CHF, type 2 diabetes, hypertension, hyperlipidemia, sleep apnea, dementia, and CKD. Patient has been brought in by 2 family members.   Patient presents today for hallucinations, falls, and tremors that have been worsening over the past couple of days. Also, they are reporting increased fatigue.  Patient reports occasional headaches.  She denies any recent illness.  No fever, cough or congestion.  Not reporting any chest pain or shortness of breath.  Denies dysuria, frequency or urgency.  Of note, patient was admitted less than 2 weeks ago for syncopal episodes and jerking movements. Patient had CT head imaging, labs and cardiology consult. Currently, she is wearing a heart monitor.  Patient is scheduled to follow-up with cardiology in a couple more weeks.  Syncopal episodes were thought to be possibly due to orthostatic hypotension.  Creatinine was found to be elevated but after fluids was improving. No recent syncopal episodes.  While in the exam room, patient states that we really need to clean that up over there and pointed to the corner of the room.  I asked her what she saw on she stated it was full of trash.  There was no garbage in the exam room.  Family says this is an example of hallucination she has.  Patient says she often sees a lot of men coming into the house and taking things.   Patient has difficulty with balance and and is supposed to be using a cane to walk. She is currently in a wheelchair.   HPI  Past Medical History:  Diagnosis Date   Anemia    Cardiomyopathy (HCC)    Ejection Fraction 30-35% per ECHO 2016   CHF (congestive heart failure) (HCC)    Diabetes (HCC)    Dyspnea    Dysrhythmia     Hyperlipidemia    Hypertension    Peripheral neuropathy    Peripheral neuropathy    Sleep apnea     Patient Active Problem List   Diagnosis Date Noted   Dementia without behavioral disturbance (HCC) 05/17/2024   Syncope 07/01/2023   Type II diabetes mellitus with renal manifestations (HCC) 07/01/2023   HLD (hyperlipidemia) 07/01/2023   Hypotension 07/01/2023   Acute renal failure superimposed on stage 3b chronic kidney disease (HCC) 07/01/2023   Hypoxemia 02/01/2023   AKI (acute kidney injury) (HCC) 02/01/2023   Elevated d-dimer 02/01/2023   SOB (shortness of breath) 01/31/2023   Chronic diastolic CHF (congestive heart failure) (HCC) 01/31/2023   Chronic kidney disease, stage 3b (HCC) 01/31/2023   Hypercalcemia 01/31/2023   Obesity (BMI 30-39.9) 01/31/2023   Lymphedema 09/03/2018   Chronic systolic heart failure (HCC) 03/08/2018   Encounter for dental exam and cleaning w/o abnormal findings 01/14/2018   High risk medication use 12/11/2015   Left lumbar radiculopathy 12/05/2014   Anemia 06/12/2014   Cardiomyopathy (HCC) 06/12/2014   Benign essential hypertension 06/12/2014   Hyperlipidemia 06/12/2014   Obstructive sleep apnea 06/12/2014   Peripheral neuropathy 06/12/2014   Proteinuria 06/12/2014   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HCC) 06/12/2014   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) 06/12/2014   Vitamin B 12 deficiency 06/12/2014   Vitamin D deficiency 06/12/2014  Past Surgical History:  Procedure Laterality Date   ABDOMINAL HYSTERECTOMY     COLONOSCOPY     COLONOSCOPY N/A 04/01/2018   Procedure: COLONOSCOPY;  Surgeon: Gaylyn Gladis PENNER, MD;  Location: [facility] ENDOSCOPY;  Service: Endoscopy;  Laterality: N/A;   COLONOSCOPY WITH PROPOFOL  N/A 12/29/2017   Procedure: COLONOSCOPY WITH PROPOFOL ;  Surgeon: Gaylyn Gladis PENNER, MD;  Location: [facility] ENDOSCOPY;  Service: Endoscopy;  Laterality: N/A;   ESOPHAGOGASTRODUODENOSCOPY  (EGD) WITH PROPOFOL  N/A 12/29/2017   Procedure: ESOPHAGOGASTRODUODENOSCOPY (EGD) WITH PROPOFOL ;  Surgeon: Gaylyn Gladis PENNER, MD;  Location: [facility] ENDOSCOPY;  Service: Endoscopy;  Laterality: N/A;   ESOPHAGOGASTRODUODENOSCOPY (EGD) WITH PROPOFOL  N/A 04/01/2018   Procedure: ESOPHAGOGASTRODUODENOSCOPY (EGD) WITH PROPOFOL ;  Surgeon: Gaylyn Gladis PENNER, MD;  Location: [facility] ENDOSCOPY;  Service: Endoscopy;  Laterality: N/A;   EUS N/A 05/20/2018   Procedure: ESOPHAGEAL ENDOSCOPIC ULTRASOUND (EUS) RADIAL;  Surgeon: Queenie Asberry LABOR, MD;  Location: [facility] ENDOSCOPY;  Service: Gastroenterology;  Laterality: N/A;    OB History   No obstetric history on file.      Home Medications    Prior to Admission medications   Medication Sig Start Date End Date Taking? Authorizing Provider  cefdinir  (OMNICEF ) 300 MG capsule Take 1 capsule (300 mg total) by mouth 2 (two) times daily for 7 days. 05/30/24 06/06/24 Yes Arvis Jolan NOVAK, PA-C  amLODipine  (NORVASC ) 5 MG tablet Take 10 mg by mouth daily. 12/11/15   [provider]  ascorbic acid  (VITAMIN C ) 500 MG tablet Take 500 mg by mouth daily.    [provider]  aspirin EC 81 MG tablet Take 81 mg by mouth daily.    [provider]  atorvastatin  (LIPITOR) 80 MG tablet Take 80 mg by mouth daily.  12/11/15   [provider]  brimonidine (ALPHAGAN) 0.2 % ophthalmic solution Place 1 drop into both eyes 2 (two) times daily.    [provider]  carvedilol  (COREG ) 25 MG tablet Take by mouth 2 (two) times daily with a meal.  07/15/16   [provider]  dapagliflozin propanediol (FARXIGA) 10 MG TABS tablet Take 1 tablet by mouth daily. 03/01/24   [provider]  donepezil  (ARICEPT ) 10 MG tablet Take 10 mg by mouth at bedtime.    [provider]  ferrous sulfate  325 (65 FE) MG tablet Take by mouth. Takes one pill every 2-3 days to minimize constipation    [provider]  gabapentin  (NEURONTIN ) 300 MG  capsule Take 300 mg by mouth 3 (three) times daily.    [provider]  hydrochlorothiazide (MICROZIDE) 12.5 MG capsule Take 12.5 mg by mouth daily. 03/01/24   [provider]  magnesium  oxide (MAG-OX) 400 MG tablet Take 400 mg by mouth daily.     [provider]  mirabegron  ER (MYRBETRIQ ) 50 MG TB24 tablet Take 1 tablet (50 mg total) by mouth daily. 12/28/20   Penne Knee, MD  Multiple Vitamin (MULTI-VITAMINS) TABS Take 1 tablet by mouth daily.  Patient not taking: Reported on 05/18/2024    [provider]  Omega-3 Fatty Acids (FISH OIL) 1000 MG CAPS Take 1,000 mg by mouth daily. Patient not taking: Reported on 07/01/2023    [provider]  torsemide  (DEMADEX ) 20 MG tablet Take 1 tablet (20 mg total) by mouth daily. 02/05/23   Gonfa, Taye T, MD  travoprost , benzalkonium, (TRAVATAN ) 0.004 % ophthalmic solution Place 1 drop into both eyes at bedtime.     [provider]  traZODone (DESYREL) 50 MG tablet  Take 1 tablet by mouth at bedtime. 01/14/23 03/01/25  [provider]    Family History Family History  Problem Relation Age of Onset   Diabetes Mother    Heart disease Mother    Bladder Cancer Neg Hx    Kidney cancer Neg Hx     Social History Social History   Tobacco Use   Smoking status: Never   Smokeless tobacco: Never  Vaping Use   Vaping status: Never Used  Substance Use Topics   Alcohol use: No   Drug use: No     Allergies   Furosemide    Review of Systems Review of Systems  Constitutional:  Positive for fatigue. Negative for fever.  HENT:  Negative for congestion.   Respiratory:  Negative for cough and shortness of breath.   Cardiovascular:  Negative for chest pain.  Gastrointestinal:  Negative for diarrhea and vomiting.  Musculoskeletal:  Positive for gait problem.  Neurological:  Positive for tremors, weakness and headaches. Negative for syncope and facial asymmetry.  Psychiatric/Behavioral:  Positive  for confusion and hallucinations.      Physical Exam Triage Vital Signs ED Triage Vitals  Encounter Vitals Group     BP      Girls Systolic BP Percentile      Girls Diastolic BP Percentile      Boys Systolic BP Percentile      Boys Diastolic BP Percentile      Pulse      Resp      Temp      Temp src      SpO2      Weight      Height      Head Circumference      Peak Flow      Pain Score      Pain Loc      Pain Education      Exclude from Growth Chart    No data found.  Updated Vital Signs BP (!) 98/58 (BP Location: Left Arm)   Pulse (!) 58   Temp 98.6 F (37 C) (Oral)   Resp 16   SpO2 99%   Physical Exam Vitals and nursing note reviewed.  Constitutional:      General: She is not in acute distress.    Appearance: Normal appearance. She is not ill-appearing or toxic-appearing.  HENT:     Head: Normocephalic and atraumatic.     Nose: Nose normal.     Mouth/Throat:     Mouth: Mucous membranes are moist.     Pharynx: Oropharynx is clear.   Eyes:     General: No scleral icterus.       Right eye: No discharge.        Left eye: No discharge.     Extraocular Movements: Extraocular movements intact.     Conjunctiva/sclera: Conjunctivae normal.     Pupils: Pupils are equal, round, and reactive to light.    Cardiovascular:     Rate and Rhythm: Regular rhythm. Bradycardia present.     Heart sounds: Normal heart sounds.  Pulmonary:     Effort: Pulmonary effort is normal. No respiratory distress.     Breath sounds: Normal breath sounds.  Abdominal:     Palpations: Abdomen is soft.     Tenderness: There is no abdominal tenderness.   Musculoskeletal:     Cervical back: Neck supple.   Skin:    General: Skin is dry.   Neurological:     General: No focal  deficit present.     Mental Status: She is alert and oriented to person, place, and time. Mental status is at baseline.     Cranial Nerves: No cranial nerve deficit.     Motor: No weakness.     Gait: Gait  abnormal (not ambulating).     Comments: Resting tremors of hands/arms  Psychiatric:        Mood and Affect: Mood normal.        Behavior: Behavior normal.      UC Treatments / Results  Labs (all labs ordered are listed, but only abnormal results are displayed) Labs Reviewed  GLUCOSE, CAPILLARY - Abnormal; Notable for the following components:      Result Value   Glucose-Capillary 194 (*)    All other components within normal limits  URINALYSIS, W/ REFLEX TO CULTURE (INFECTION SUSPECTED) - Abnormal; Notable for the following components:   APPearance HAZY (*)    Nitrite POSITIVE (*)    Bacteria, UA MANY (*)    All other components within normal limits  URINE CULTURE  CBG MONITORING, ED    EKG   Radiology No results found.  Narrative & Impression  CLINICAL DATA:  syncope vs seizure, recent fall   EXAM: CT HEAD WITHOUT CONTRAST   TECHNIQUE: Contiguous axial images were obtained from the base of the skull through the vertex without intravenous contrast.   RADIATION DOSE REDUCTION: This exam was performed according to the departmental dose-optimization program which includes automated exposure control, adjustment of the mA and/or kV according to patient size and/or use of iterative reconstruction technique.   COMPARISON:  07/01/2023   FINDINGS: Brain: No acute intracranial abnormality. Specifically, no hemorrhage, hydrocephalus, mass lesion, acute infarction, or significant intracranial injury.   Vascular: No hyperdense vessel or unexpected calcification.   Skull: No acute calvarial abnormality.   Sinuses/Orbits: No acute findings   Other: None   IMPRESSION: No acute intracranial abnormality.     Electronically Signed   By: Franky Crease M.D.   On: 05/17/2024 17:33    Procedures Procedures (including critical care time)  Medications Ordered in UC Medications - No data to display  Initial Impression / Assessment and Plan / UC Course  I have  reviewed the triage vital signs and the nursing notes.  Pertinent labs & imaging results that were available during my care of the patient were reviewed by me and considered in my medical decision making (see chart for details).   83 year old female with history of heart failure, cardiomyopathy, hypertension, hyperlipidemia, OSA, type 2 diabetes, CKD and dementia presents with family for evaluation of worsening falls, confusion and hallucinations over the past couple days.  She was admitted to the hospital a couple weeks ago for syncopal episodes, falls and tremors.  Patient is currently on a heart monitor.  Syncopal episodes were thought to be due to orthostatic hypotension.  She has cardiology follow-up next month.  Family denies any recent syncopal episodes but the hallucinations of gotten worse. Family and patient provide history.  Blood pressure 98/58.  Pulse 58. Has history of bradycardia and hypotension (this is not new). Other vitals normal and stable.  She is sitting comfortably in a wheelchair.  Not ambulating.  Patient has been advised to ambulate with a cane as her balance is off.  Normal cranial nerves.  Chest clear.  Heart regular rate and rhythm.  Abdomen soft and nontender.  Patient is alert and oriented x 3.  She does report seeing trash in the  corner of the room.  This is definitely hallucination as there is no trash in the corner of the room.  Fingerstick glucose is 194.  Urinalysis to be obtained to evaluate for possible UTI causing her symptoms.  Advised family if no clear UTI will suggest further workup in the emergency department as she will need a higher level of care.  Family is understanding.  UA shows hazy appearance with positive nitrites and many bacteria. Will send urine for culture. Possible UTI.   Family given option to take to ED for further workup for confusion or we many treat for UTI and watch very closely for any worsening of symptoms and take to ED if symptoms  worsen. Also advised ED if fever, increased weakness, more falls, shortness of breath, vomiting, worsening confusion, etc. Family would like the second option to avoid ED if possible. Advised low threshold to take to ER if symptoms worsening or not improving.   Final Clinical Impressions(s) / UC Diagnoses   Final diagnoses:  Acute cystitis without hematuria  Hallucinations  Frequent falls  Confusion  Dementia, unspecified dementia severity, unspecified dementia type, unspecified whether behavioral, psychotic, or mood disturbance or anxiety (HCC)     Discharge Instructions      -Possible urinary tract infection. I sent urine for culture and it will be back in 2 days. If culture is negative or symptoms not improving in 2 days take to ER for further work up -If any acute worsening of symptoms --fever, more falls, passing out, chest pain, breathing trouble, not urinating, blood in urine, vomiting, increased weakness take to ER.  -Increase fluids and rest. Be careful with position changes and use cane to walk. -Follow up with PCP and cardiology as scheduled     ED Prescriptions     Medication Sig Dispense Auth. Provider   cefdinir  (OMNICEF ) 300 MG capsule Take 1 capsule (300 mg total) by mouth 2 (two) times daily for 7 days. 14 capsule Arvis Jolan NOVAK, PA-C      PDMP not reviewed this encounter.   Arvis Jolan NOVAK, PA-C 05/30/24 [phone]

## 2024-06-01 ENCOUNTER — Ambulatory Visit (HOSPITAL_COMMUNITY): Payer: Self-pay

## 2024-06-01 LAB — URINE CULTURE: Culture: >=100000 — AB

## 2024-07-06 ENCOUNTER — Other Ambulatory Visit: Payer: Self-pay | Admitting: Nephrology

## 2024-07-06 DIAGNOSIS — N2889 Other specified disorders of kidney and ureter: Secondary | ICD-10-CM

## 2024-07-12 ENCOUNTER — Ambulatory Visit
Admission: RE | Admit: 2024-07-12 | Discharge: 2024-07-12 | Disposition: A | Discharge status: Home / Self Care | Source: Ambulatory Visit | Attending: Nephrology | Admitting: Nephrology

## 2024-07-12 DIAGNOSIS — N2889 Other specified disorders of kidney and ureter: Secondary | ICD-10-CM | POA: Diagnosis present

## 2024-07-21 ENCOUNTER — Emergency Department

## 2024-07-21 ENCOUNTER — Emergency Department
Admission: EM | Admit: 2024-07-21 | Discharge: 2024-07-21 | Disposition: A | Discharge status: Home / Self Care | Attending: Emergency Medicine | Admitting: Emergency Medicine

## 2024-07-21 ENCOUNTER — Ambulatory Visit
Admission: EM | Admit: 2024-07-21 | Discharge: 2024-07-21 | Disposition: A | Discharge status: Other Acute Inpatient Hospital | Attending: Family Medicine | Admitting: Family Medicine

## 2024-07-21 ENCOUNTER — Other Ambulatory Visit: Payer: Self-pay

## 2024-07-21 DIAGNOSIS — F0390 Unspecified dementia without behavioral disturbance: Secondary | ICD-10-CM

## 2024-07-21 DIAGNOSIS — S0990XA Unspecified injury of head, initial encounter: Secondary | ICD-10-CM

## 2024-07-21 DIAGNOSIS — W19XXXA Unspecified fall, initial encounter: Secondary | ICD-10-CM

## 2024-07-21 DIAGNOSIS — R0902 Hypoxemia: Secondary | ICD-10-CM

## 2024-07-21 DIAGNOSIS — W06XXXA Fall from bed, initial encounter: Secondary | ICD-10-CM | POA: Diagnosis not present

## 2024-07-21 DIAGNOSIS — Z659 Problem related to unspecified psychosocial circumstances: Secondary | ICD-10-CM | POA: Insufficient documentation

## 2024-07-21 LAB — URINALYSIS, W/ REFLEX TO CULTURE (INFECTION SUSPECTED)
Bilirubin Urine: NEGATIVE
Glucose, UA: NEGATIVE mg/dL
Hgb urine dipstick: NEGATIVE
Ketones, ur: 5 mg/dL — AB
Leukocytes,Ua: NEGATIVE
Nitrite: NEGATIVE
Protein, ur: NEGATIVE mg/dL
Specific Gravity, Urine: 1.015 (ref 1.005–1.030)
pH: 5 (ref 5.0–8.0)

## 2024-07-21 LAB — CBC WITH DIFFERENTIAL/PLATELET
Abs Immature Granulocytes: 0.01 K/uL (ref 0.00–0.07)
Basophils Absolute: 0 K/uL (ref 0.0–0.1)
Basophils Relative: 1 %
Eosinophils Absolute: 0.1 K/uL (ref 0.0–0.5)
Eosinophils Relative: 2 %
HCT: 41.9 % (ref 36.0–46.0)
Hemoglobin: 13.4 g/dL (ref 12.0–15.0)
Immature Granulocytes: 0 %
Lymphocytes Relative: 37 %
Lymphs Abs: 2 K/uL (ref 0.7–4.0)
MCH: 30.7 pg (ref 26.0–34.0)
MCHC: 32 g/dL (ref 30.0–36.0)
MCV: 95.9 fL (ref 80.0–100.0)
Monocytes Absolute: 0.6 K/uL (ref 0.1–1.0)
Monocytes Relative: 12 %
Neutro Abs: 2.6 K/uL (ref 1.7–7.7)
Neutrophils Relative %: 48 %
Platelets: 142 K/uL — ABNORMAL LOW (ref 150–400)
RBC: 4.37 MIL/uL (ref 3.87–5.11)
RDW: 13.2 % (ref 11.5–15.5)
WBC: 5.4 K/uL (ref 4.0–10.5)
nRBC: 0 % (ref 0.0–0.2)

## 2024-07-21 LAB — COMPREHENSIVE METABOLIC PANEL WITH GFR
ALT: 9 U/L (ref 0–44)
AST: 24 U/L (ref 15–41)
Albumin: 3.3 g/dL — ABNORMAL LOW (ref 3.5–5.0)
Alkaline Phosphatase: 47 U/L (ref 38–126)
Anion gap: 11 (ref 5–15)
BUN: 35 mg/dL — ABNORMAL HIGH (ref 8–23)
CO2: 31 mmol/L (ref 22–32)
Calcium: 10.1 mg/dL (ref 8.9–10.3)
Chloride: 98 mmol/L (ref 98–111)
Creatinine, Ser: 2.29 mg/dL — ABNORMAL HIGH (ref 0.44–1.00)
GFR, Estimated: 21 mL/min — ABNORMAL LOW (ref >60)
Glucose, Bld: 96 mg/dL (ref 70–99)
Potassium: 3.4 mmol/L — ABNORMAL LOW (ref 3.5–5.1)
Sodium: 140 mmol/L (ref 135–145)
Total Bilirubin: 1 mg/dL (ref 0.0–1.2)
Total Protein: 6.9 g/dL (ref 6.5–8.1)

## 2024-07-21 LAB — GLUCOSE, CAPILLARY: Glucose-Capillary: 94 mg/dL (ref 70–99)

## 2024-07-21 MED ORDER — SODIUM CHLORIDE 0.9 % IV BOLUS
1000.0000 mL | Freq: Once | INTRAVENOUS | Status: AC
  Administered 2024-07-21: 1000 mL via INTRAVENOUS

## 2024-07-21 NOTE — ED Notes (Signed)
 Son at bedside said pt fell last night and hit head. No LOC but states he found her on the ground. Son states she is not at her baseline.

## 2024-07-21 NOTE — ED Provider Notes (Signed)
 Emergency department handoff note  Care of this patient was signed out to me at the end of the previous provider shift.  All pertinent patient information was conveyed and all questions were answered.  Patient pending urinalysis that only showed rare bacteria but also 11-20 squamous cells with mucus concerning for contamination.  Patient denies any dysuria at this time.  I spoke to patient's son regarding these results and agrees with plan for discharge The patient has been reexamined and is ready to be discharged.  All diagnostic results have been reviewed and discussed with the patient/family.  Care plan has been outlined and the patient/family understands all current diagnoses, results, and treatment plans.  There are no new complaints, changes, or physical findings at this time.  All questions have been addressed and answered.  Patient was instructed to, and agrees to follow-up with their primary care physician as well as return to the emergency department if any new or worsening symptoms develop.    Jossie Artist POUR, MD 07/21/24 [phone]

## 2024-07-21 NOTE — ED Triage Notes (Signed)
 Pt BIB [facility] from [facility]. Family brought her there due to an increase in altered mental status in addition to her dementia. Had a fall x3 days ago and hit head. Baseline is unknown. Has Hx of frequent falls.  107/62 96 RA

## 2024-07-21 NOTE — ED Provider Notes (Signed)
 [facility] Provider Note    Event Date/Time   First MD Initiated Contact with Patient 07/21/24 [phone]     (approximate)   History   Fall and Altered Mental Status   HPI  Shelby [name] is a 83 year old female presenting to the emergency department for evaluation after a fall.  Yesterday, patient reports she went to get out of her bed when she fell and hit her head.  Was taken to urgent care for further evaluation.  There documentation reports multiple falls.  While there, was noted to have mild hypoxia, sent to the ER for further evaluation.  Patient denies chest pain or shortness of breath.  Denies injuries to other areas.Reports mild confusion at baseline, does have a history of dementia, does not feel that she is more confused than her normal.       Physical Exam   Triage Vital Signs: ED Triage Vitals  Encounter Vitals Group     BP 07/21/24 0958 121/74     Girls Systolic BP Percentile --      Girls Diastolic BP Percentile --      Boys Systolic BP Percentile --      Boys Diastolic BP Percentile --      Pulse Rate 07/21/24 0958 (!) 58     Resp 07/21/24 0958 18     Temp 07/21/24 0958 97.8 F (36.6 C)     Temp Source 07/21/24 0958 Oral     SpO2 07/21/24 0958 96 %     Weight 07/21/24 1002 177 lb 7.5 oz (80.5 kg)     Height 07/21/24 1002 5' (1.524 m)     Head Circumference --      Peak Flow --      Pain Score 07/21/24 1002 0     Pain Loc --      Pain Education --      Exclude from Growth Chart --     Most recent vital signs: Vitals:   07/21/24 1330 07/21/24 1406  BP: 114/63 102/67  Pulse: (!) 51 (!) 54  Resp:  18  Temp:  97.8 F (36.6 C)  SpO2: 98% 96%     General: Awake, interactive  Head:  Atraumatic CV:  Bradycardia, regular rhythm Resp:  Unlabored respirations, lungs clear to auscultation Abd:  Nondistended, soft, nontender Neuro:  Symmetric facial movement, fluid speech, oriented x 4, moving extremities spontaneously  and equally   ED Results / Procedures / Treatments   Labs (all labs ordered are listed, but only abnormal results are displayed) Labs Reviewed  CBC WITH DIFFERENTIAL/PLATELET - Abnormal; Notable for the following components:      Result Value   Platelets 142 (*)    All other components within normal limits  COMPREHENSIVE METABOLIC PANEL WITH GFR - Abnormal; Notable for the following components:   Potassium 3.4 (*)    BUN 35 (*)    Creatinine, Ser 2.29 (*)    Albumin  3.3 (*)    GFR, Estimated 21 (*)    All other components within normal limits  URINALYSIS, W/ REFLEX TO CULTURE (INFECTION SUSPECTED)     EKG EKG independently reviewed and interpreted by myself demonstrates:  EKG demonstrates suspected sinus rhythm with P waves most appreciable in lead II rhythm strip at a rate of 61, PR 178, QRS 108, QTc 424, no acute ST changes  RADIOLOGY Imaging independently reviewed and interpreted by myself demonstrates:  CT head without acute bleed CT C-spine without acute fracture Chest  x-Wilman Tucker without focal consolidation  Formal Radiology Read:  CT Cervical Spine Wo Contrast Result Date: 07/21/2024 EXAM: CT CERVICAL SPINE WITHOUT CONTRAST 07/21/2024 02:25:00 PM TECHNIQUE: CT of the cervical spine was performed without the administration of intravenous contrast. Multiplanar reformatted images are provided for review. Automated exposure control, iterative reconstruction, and/or weight based adjustment of the mA/kV was utilized to reduce the radiation dose to as low as reasonably achievable. COMPARISON: None available. CLINICAL HISTORY: Neck trauma (Age >= 65y). Pt BIB [facility] from [facility]. Family brought her there due to an increase in altered mental status in addition to her dementia. Had a fall x3 days ago and hit head. Baseline is unknown. Has Hx of frequent falls. FINDINGS: CERVICAL SPINE: BONES AND ALIGNMENT: There is reversal of the normal cervical lordosis. DEGENERATIVE CHANGES: There is  slight degenerative anterolisthesis at C4-5. There is disc space narrowing and posterior endplate ridging at C5-6 and C6-7, causing mild-to-moderate central spinal canal stenosis at both levels. There is also moderate right neural foraminal stenosis at C5-6. SOFT TISSUES: No prevertebral soft tissue swelling. IMPRESSION: 1. No acute abnormality of the cervical spine related to the reported neck trauma. 2. Slight degenerative anterolisthesis at C4-5. 3. Disc space narrowing and posterior endplate ridging at C5-6 and C6-7, causing mild-to-moderate central spinal canal stenosis at both levels and moderate right neural foraminal stenosis at C5-6. Electronically signed by: evalene coho 07/21/2024 02:52 PM EDT RP Workstation: [id number]   CT Head Wo Contrast Result Date: 07/21/2024 EXAM: CT HEAD WITHOUT CONTRAST 07/21/2024 02:25:00 PM TECHNIQUE: CT of the head was performed without the administration of intravenous contrast. Automated exposure control, iterative reconstruction, and/or weight based adjustment of the mA/kV was utilized to reduce the radiation dose to as low as reasonably achievable. COMPARISON: CT of the head dated 05/17/2024. CLINICAL HISTORY: Head trauma, minor (Age >= 65y). Pt BIB [facility] from [facility]. Family brought her there due to an increase in altered mental status in addition to her dementia. Had a fall x3 days ago and hit head. Baseline is unknown. Has Hx of frequent falls. FINDINGS: BRAIN AND VENTRICLES: No acute hemorrhage. Gray-white differentiation is preserved. No hydrocephalus. No extra-axial collection. No mass effect or midline shift. Age-related atrophy and mild periventricular white matter disease. ORBITS: No acute abnormality. SINUSES: No acute abnormality. SOFT TISSUES AND SKULL: No acute soft tissue abnormality. No skull fracture. VASCULATURE: Mild calcific atheromatous disease within the carotid siphons. IMPRESSION: 1. No acute intracranial abnormality. 2. Age-related atrophy  and mild periventricular white matter disease. 3. Mild calcific atheromatous disease within the carotid siphons. Electronically signed by: evalene coho 07/21/2024 02:50 PM EDT RP Workstation: [id number]   DG Chest Portable 1 View Result Date: 07/21/2024 CLINICAL DATA:  Hypoxia. EXAM: PORTABLE CHEST 1 VIEW COMPARISON:  May 17, 2024. FINDINGS: Stable cardiomediastinal silhouette. Stable elevated right hemidiaphragm. Both lungs are clear. The visualized skeletal structures are unremarkable. IMPRESSION: No active disease. Electronically Signed   By: Lynwood Landy Raddle M.D.   On: 07/21/2024 11:36    PROCEDURES:  Critical Care performed: No  Procedures   MEDICATIONS ORDERED IN ED: Medications  sodium chloride  0.9 % bolus 1,000 mL (1,000 mLs Intravenous New Bag/Given 07/21/24 1405)     IMPRESSION / MDM / ASSESSMENT AND PLAN / ED COURSE  I reviewed the triage vital signs and the nursing notes.  Differential diagnosis includes, but is not limited to, intracranial bleed, skull fracture, no evidence of thoracoabdominal trauma, anemia, electrolyte abnormality, UTI  Patient's presentation is most consistent  with acute presentation with potential threat to life or bodily function.  83 year old female presenting with recent fall and possible confusion from baseline, though no significant confusion on my exam here.  Concern for hypoxia at urgent care, stable on room air here.  Labs with reassuring CBC, CMP with renal impairment but similar to recent from last month.  CT head and C-spine without traumatic injuries.  Chest x-Kateryna Grantham reassuring.  Pending urinalysis.  Signed out to oncoming physician pending urine, anticipate patient will be stable for discharge following this, with antibiotics if indicated.      FINAL CLINICAL IMPRESSION(S) / ED DIAGNOSES   Final diagnoses:  Closed head injury, initial encounter     Rx / DC Orders   ED Discharge Orders     None        Note:  This document  was prepared using Dragon voice recognition software and may include unintentional dictation errors.   Levander Slate, MD 07/21/24 [phone]

## 2024-07-21 NOTE — ED Provider Notes (Signed)
 [facility]    CSN: [id number] Arrival date & time: 07/21/24  9160      History   Chief Complaint Chief Complaint  Patient presents with   Head Injury   Fall    HPI Shelby [name] is a 83 y.o. female.   HPI  History provided by patient and her son.  Pt seen in triage   David presents for falls.  She complains of headache.  She fell at 12 last night on the nightstand drawer. She hit her head but did not pass out.  Son found her after hearing her fall. Pt states she wanted to wash clothes.  She also fell the day before yesterday but this was an unwitnessed fall. She says she didn't hit her head during that fall.  She is unsure of her medications but says she has them with her. Her son notes I don't know anything. I just moved back home.  Pt has known dementia. She takes her own medications.       Past Medical History:  Diagnosis Date   Anemia    Cardiomyopathy (HCC)    Ejection Fraction 30-35% per ECHO 2016   CHF (congestive heart failure) (HCC)    Diabetes (HCC)    Dyspnea    Dysrhythmia    Hyperlipidemia    Hypertension    Peripheral neuropathy    Peripheral neuropathy    Sleep apnea     Patient Active Problem List   Diagnosis Date Noted   Problem related to unspecified psychosocial circumstances 07/21/2024   Seizure-like activity (HCC) 03/01/2024   Hyperparathyroidism, primary (HCC) 08/04/2023   Chronic idiopathic constipation 07/21/2023   Generalized anxiety disorder 07/21/2023   Syncope 07/01/2023   Type II diabetes mellitus with renal manifestations (HCC) 07/01/2023   HLD (hyperlipidemia) 07/01/2023   Acute renal failure superimposed on stage 3b chronic kidney disease (HCC) 07/01/2023   OAB (overactive bladder) 03/30/2023   Weakness 02/05/2023   Hypoxemia 02/01/2023   AKI (acute kidney injury) (HCC) 02/01/2023   Elevated d-dimer 02/01/2023   SOB (shortness of breath) 01/31/2023   Chronic diastolic CHF (congestive heart  failure) (HCC) 01/31/2023   Hypercalcemia 01/31/2023   Obesity (BMI 30-39.9) 01/31/2023   Pancreatic cyst 05/22/2022   Primary open angle glaucoma (POAG) of left eye, indeterminate stage 05/22/2022   Skin lesion 05/22/2022   Osteopenia of multiple sites 02/17/2022   Non-seasonal allergic rhinitis 11/18/2021   Tremor of unknown origin 11/18/2021   Restless leg syndrome 06/28/2021   Lumbago with sciatica, right side 06/24/2020   Calculus of gallbladder without cholecystitis without obstruction 12/09/2019   Hypertensive heart disease with heart failure (HCC) 12/09/2019   Other chronic pain 12/09/2019   Type 2 diabetes mellitus with diabetic polyneuropathy (HCC) 12/09/2019   Lymphedema 09/03/2018   Multinodular goiter 07/31/2018   Aortic atherosclerosis (HCC) 07/27/2018   Chronic renal insufficiency, stage 3 (moderate) (HCC) 04/19/2018   Pulmonary nodule 04/18/2018   Chronic systolic heart failure (HCC) 03/08/2018   History of nephrolithiasis 01/23/2018   Hypomagnesemia 01/19/2018   Person encountering health services to consult on behalf of another person 01/14/2018   Thrombocytopenia (HCC) 11/03/2017   Unspecified dementia, unspecified severity, without behavioral disturbance, psychotic disturbance, mood disturbance, and anxiety (HCC) 07/30/2017   Urinary incontinence in female 02/23/2017   High risk medication use 12/11/2015   Left lumbar radiculopathy 12/05/2014   Anemia, unspecified 06/12/2014   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   Type 2 diabetes w unsp diabetic rtnop w/o macular edema (  HCC) 06/12/2014   Benign essential hypertension 06/12/2014   Hyperlipidemia 06/12/2014   Obstructive sleep apnea 06/12/2014   Peripheral neuropathy 06/12/2014   Proteinuria 06/12/2014   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HCC) 06/12/2014   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) 06/12/2014   Vitamin B 12 deficiency 06/12/2014   Vitamin D  deficiency 06/12/2014   Hypertension associated with type 2 diabetes mellitus (HCC) 06/12/2014   Iron deficiency anemia due to chronic blood loss 06/12/2014    Past Surgical History:  Procedure Laterality Date   ABDOMINAL HYSTERECTOMY     COLONOSCOPY     COLONOSCOPY N/A 04/01/2018   Procedure: COLONOSCOPY;  Surgeon: Gaylyn Gladis PENNER, MD;  Location[facility] END[facility]COPY;  Service: Endoscopy;  Laterality: N/A;   COLONOSCOPY WITH PROPOFOL  N/A 12/29/2017   Procedure: COLONOSCOPY WITH PROPOFOL ;  Surgeon: Gaylyn Gladis PENNER, MD;  Location: [facility] ENDOSCOPY;  Service: Endoscopy;  Laterality: N/A;   ESOPHAGOGASTRODUODENOSCOPY (EGD) WITH PROPOFOL  N/A 12/29/2017   Procedure: ESOPHAGOGASTRODUODENOSCOPY (EGD) WITH PROPOFOL ;  Surgeon: Gaylyn Gladis PENNER, MD;  Location: [facility] ENDOSCOPY;  Service: Endoscopy;  Laterality: N/A;   ESOPHAGOGASTRODUODENOSCOPY (EGD) WITH PROPOFOL  N/A 04/01/2018   Procedure: ESOPHAGOGASTRODUODENOSCOPY (EGD) WITH PROPOFOL ;  Surgeon: Gaylyn Gladis PENNER, MD;  Location: [facility] ENDOSCOPY;  Service: Endoscopy;  Laterality: N/A;   EUS N/A 05/20/2018   Procedure: ESOPHAGEAL ENDOSCOPIC ULTRASOUND (EUS) RADIAL;  Surgeon: Queenie Asberry LABOR, MD;  Location: [facility] ENDOSCOPY;  Service: Gastroenterology;  Laterality: N/A;    OB History   No obstetric history on file.      Home Medications    Prior to Admission medications   Medication Sig Start Date End Date Taking? Authorizing Provider  amLODipine  (NORVASC ) 5 MG tablet Take 10 mg by mouth daily. 12/11/15   [provider]  ascorbic acid  (VITAMIN C ) 500 MG tablet Take 500 mg by mouth daily.    [provider]  aspirin EC 81 MG tablet Take 81 mg by mouth daily.    [provider]  atorvastatin  (LIPITOR) 80 MG tablet Take 80 mg by mouth daily.  12/11/15   [provider]  brimonidine (ALPHAGAN) 0.2 % ophthalmic solution Place 1 drop into both eyes 2 (two) times daily.    [provider]  carvedilol  (COREG )  25 MG tablet Take by mouth 2 (two) times daily with a meal.  07/15/16   [provider]  dapagliflozin propanediol (FARXIGA) 10 MG TABS tablet Take 1 tablet by mouth daily. 03/01/24   [provider]  donepezil  (ARICEPT ) 10 MG tablet Take 10 mg by mouth at bedtime.    [provider]  ferrous sulfate  325 (65 FE) MG tablet Take by mouth. Takes one pill every 2-3 days to minimize constipation    [provider]  gabapentin  (NEURONTIN ) 300 MG capsule Take 300 mg by mouth 3 (three) times daily.    [provider]  hydrochlorothiazide (MICROZIDE) 12.5 MG capsule Take 12.5 mg by mouth daily. 03/01/24   [provider]  magnesium  oxide (MAG-OX) 400 MG tablet Take 400 mg by mouth daily.     [provider]  mirabegron  ER (MYRBETRIQ ) 50 MG TB24 tablet Take 1 tablet (50 mg total) by mouth daily. 12/28/20   Penne Knee, MD  Multiple Vitamin (MULTI-VITAMINS) TABS Take 1 tablet by mouth daily.  Patient not taking: Reported on 05/18/2024    [provider]  Omega-3 Fatty Acids (FISH OIL) 1000 MG CAPS Take 1,000 mg by mouth daily. Patient not  taking: Reported on 07/01/2023    [provider]  torsemide  (DEMADEX ) 20 MG tablet Take 1 tablet (20 mg total) by mouth daily. 02/05/23   Gonfa, Taye T, MD  travoprost , benzalkonium, (TRAVATAN ) 0.004 % ophthalmic solution Place 1 drop into both eyes at bedtime.     [provider]  traZODone (DESYREL) 50 MG tablet Take 1 tablet by mouth at bedtime. 01/14/23 03/01/25  [provider]    Family History Family History  Problem Relation Age of Onset   Diabetes Mother    Heart disease Mother    Bladder Cancer Neg Hx    Kidney cancer Neg Hx     Social History Social History   Tobacco Use   Smoking status: Never   Smokeless tobacco: Never  Vaping Use   Vaping status: Never Used  Substance Use Topics   Alcohol use: No   Drug use: No     Allergies    Furosemide    Review of Systems Review of Systems: limited as pt has dementia     Physical Exam Triage Vital Signs ED Triage Vitals  Encounter Vitals Group     BP 07/21/24 0846 103/63     Girls Systolic BP Percentile --      Girls Diastolic BP Percentile --      Boys Systolic BP Percentile --      Boys Diastolic BP Percentile --      Pulse Rate 07/21/24 0846 60     Resp 07/21/24 0846 18     Temp 07/21/24 0846 97.6 F (36.4 C)     Temp Source 07/21/24 0846 Oral     SpO2 07/21/24 0846 97 %     Weight --      Height --      Head Circumference --      Peak Flow --      Pain Score 07/21/24 0852 7     Pain Loc --      Pain Education --      Exclude from Growth Chart --    No data found.  Updated Vital Signs BP 103/63 (BP Location: Right Arm)   Pulse 65   Temp 97.6 F (36.4 C) (Oral)   Resp 18   SpO2 (!) 86%   Visual Acuity Right Eye Distance:   Left Eye Distance:   Bilateral Distance:    Right Eye Near:   Left Eye Near:    Bilateral Near:     Physical Exam GEN:     alert, cooperative and no distress    HENT:  mucus membranes moist, oropharyngeal without lesions or erythema,  nares patent, no nasal discharge, no tongue lesions, no palpable hematoma or scalp tenderness EYES:   pupils equal and reactive, EOM intact NECK:  supple, good ROM, no midline tenderness  RESP:  clear to auscultation bilaterally, no increased work of breathing  CVS:   regular rate and rhythm EXT:   atraumatic NEURO:  alert, oriented to person, not time or place, speech normal, able to recall birth date and address, no facial droop,  sensation grossly intact, normal coordination of bilateral UE and LE, coarse twitching/tremors of extremities Skin:   warm and dry      UC Treatments / Results  Labs (all labs ordered are listed, but only abnormal results are displayed) Labs Reviewed  GLUCOSE, CAPILLARY    EKG  If EKG performed, see my interpretation in the MDM  section  Radiology No results found.   Procedures  Procedures (including critical care time)  Medications Ordered in UC Medications - No data to display  Initial Impression / Assessment and Plan / UC Course  I have reviewed the triage vital signs and the nursing notes.  Pertinent labs & imaging results that were available during my care of the patient were reviewed by me and considered in my medical decision making (see chart for details).       Patient is a 83 y.o. female  who has history of dementia, systolic heart failure, type 2 diabetes, hyperlipidemia, dysrhythmia, seizure-like activity and electrolyte disturbances presents for recurrent falls.  Patient hit her head last night on the nightstand door.  Overall patient is nontoxic-appearing and afebrile.  She is normotensive and afebrile however she is satting between 85%.  She randomly desats especially with talking but occasionally at rest.  Patient placed on 2 L nasal cannula.  She has twitching and tremors of extremities which son is unsure if is new or not.  Glucose here is normal.  EKG sinus rhythm with anterior infarct changes but grossly unchanged from June 01, 2024 EKG.  Given her falls with head strike and hypoxia recommended ED evaluation.  They are agreeable to EMS transport.  EMS updated upon arrival.  Called and spoke with charge RN who await patient's arrival via EMS.  Discussed MDM, treatment plan and plan for follow-up with patient and her son who agrees with plan.   Final Clinical Impressions(s) / UC Diagnoses   Final diagnoses:  Fall, initial encounter  Injury of head, initial encounter  Hypoxia  Dementia, unspecified dementia severity, unspecified dementia type, unspecified whether behavioral, psychotic, or mood disturbance or anxiety [facility])   Discharge Instructions   None    ED Prescriptions   None    PDMP not reviewed this encounter.   Viveka Wilmeth, DO 07/21/24 [phone]

## 2024-07-21 NOTE — ED Notes (Signed)
 Patient is being discharged from the Urgent Care and sent to the Emergency Department via EMS . Per provider ALONSO Porteous, patient is in need of higher level of care due to head injury. Patient is aware and verbalizes understanding of plan of care. Going to [facility] by EMS.  Vitals:   07/21/24 0846 07/21/24 0901  BP: 103/63   Pulse: 60 65  Resp: 18   Temp: 97.6 F (36.4 C)   SpO2: 97% (!) 86%

## 2024-07-21 NOTE — ED Notes (Signed)
 Pt is sleeping. Even rise and fall of chest noted. NAD. Vitals WDL

## 2024-07-21 NOTE — ED Notes (Signed)
 Patient is being discharged from the Urgent Care and sent to the Emergency Department via EMS . Per Dr.Brimage, patient is in need of higher level of care due to head injury. Patient is aware and verbalizes understanding of plan of care.  Vitals:   07/21/24 0846 07/21/24 0901  BP: 103/63   Pulse: 60 65  Resp: 18   Temp: 97.6 F (36.4 C)   SpO2: 97% (!) 86%

## 2024-07-21 NOTE — ED Triage Notes (Addendum)
 Pt c/o head injury,HA & blurred vision d/t fall 3 days ago. States she fell twice trying to get out of bed. Denies any LOC.

## 2024-08-08 ENCOUNTER — Emergency Department

## 2024-08-08 ENCOUNTER — Emergency Department
Admission: EM | Admit: 2024-08-08 | Discharge: 2024-08-08 | Disposition: A | Discharge status: Home / Self Care | Attending: Emergency Medicine | Admitting: Emergency Medicine

## 2024-08-08 ENCOUNTER — Other Ambulatory Visit: Payer: Self-pay

## 2024-08-08 DIAGNOSIS — F0390 Unspecified dementia without behavioral disturbance: Secondary | ICD-10-CM | POA: Diagnosis not present

## 2024-08-08 DIAGNOSIS — I509 Heart failure, unspecified: Secondary | ICD-10-CM | POA: Insufficient documentation

## 2024-08-08 DIAGNOSIS — E1122 Type 2 diabetes mellitus with diabetic chronic kidney disease: Secondary | ICD-10-CM | POA: Diagnosis not present

## 2024-08-08 DIAGNOSIS — N189 Chronic kidney disease, unspecified: Secondary | ICD-10-CM | POA: Diagnosis not present

## 2024-08-08 DIAGNOSIS — I130 Hypertensive heart and chronic kidney disease with heart failure and stage 1 through stage 4 chronic kidney disease, or unspecified chronic kidney disease: Secondary | ICD-10-CM | POA: Diagnosis not present

## 2024-08-08 DIAGNOSIS — E1142 Type 2 diabetes mellitus with diabetic polyneuropathy: Secondary | ICD-10-CM | POA: Insufficient documentation

## 2024-08-08 DIAGNOSIS — S82831A Other fracture of upper and lower end of right fibula, initial encounter for closed fracture: Secondary | ICD-10-CM | POA: Diagnosis not present

## 2024-08-08 DIAGNOSIS — W19XXXA Unspecified fall, initial encounter: Secondary | ICD-10-CM | POA: Insufficient documentation

## 2024-08-08 DIAGNOSIS — S99911A Unspecified injury of right ankle, initial encounter: Secondary | ICD-10-CM | POA: Diagnosis present

## 2024-08-08 DIAGNOSIS — Y92009 Unspecified place in unspecified non-institutional (private) residence as the place of occurrence of the external cause: Secondary | ICD-10-CM

## 2024-08-08 LAB — COMPREHENSIVE METABOLIC PANEL WITH GFR
ALT: 12 U/L (ref 0–44)
AST: 24 U/L (ref 15–41)
Albumin: 3.4 g/dL — ABNORMAL LOW (ref 3.5–5.0)
Alkaline Phosphatase: 62 U/L (ref 38–126)
Anion gap: 12 (ref 5–15)
BUN: 34 mg/dL — ABNORMAL HIGH (ref 8–23)
CO2: 32 mmol/L (ref 22–32)
Calcium: 11 mg/dL — ABNORMAL HIGH (ref 8.9–10.3)
Chloride: 96 mmol/L — ABNORMAL LOW (ref 98–111)
Creatinine, Ser: 2.12 mg/dL — ABNORMAL HIGH (ref 0.44–1.00)
GFR, Estimated: 23 mL/min — ABNORMAL LOW (ref >60)
Glucose, Bld: 98 mg/dL (ref 70–99)
Potassium: 3.4 mmol/L — ABNORMAL LOW (ref 3.5–5.1)
Sodium: 140 mmol/L (ref 135–145)
Total Bilirubin: 0.7 mg/dL (ref 0.0–1.2)
Total Protein: 6.8 g/dL (ref 6.5–8.1)

## 2024-08-08 LAB — URINALYSIS, ROUTINE W REFLEX MICROSCOPIC
Bilirubin Urine: NEGATIVE
Glucose, UA: NEGATIVE mg/dL
Hgb urine dipstick: NEGATIVE
Ketones, ur: 5 mg/dL — AB
Leukocytes,Ua: NEGATIVE
Nitrite: NEGATIVE
Protein, ur: NEGATIVE mg/dL
Specific Gravity, Urine: 1.023 (ref 1.005–1.030)
pH: 5 (ref 5.0–8.0)

## 2024-08-08 LAB — CBC
HCT: 41.5 % (ref 36.0–46.0)
Hemoglobin: 13.3 g/dL (ref 12.0–15.0)
MCH: 30.8 pg (ref 26.0–34.0)
MCHC: 32 g/dL (ref 30.0–36.0)
MCV: 96.1 fL (ref 80.0–100.0)
Platelets: 141 K/uL — ABNORMAL LOW (ref 150–400)
RBC: 4.32 MIL/uL (ref 3.87–5.11)
RDW: 13.8 % (ref 11.5–15.5)
WBC: 5.3 K/uL (ref 4.0–10.5)
nRBC: 0 % (ref 0.0–0.2)

## 2024-08-08 MED ORDER — TRAMADOL HCL 50 MG PO TABS
50.0000 mg | ORAL_TABLET | Freq: Once | ORAL | Status: AC
  Administered 2024-08-08: 50 mg via ORAL
  Filled 2024-08-08: qty 1

## 2024-08-08 NOTE — ED Notes (Signed)
 Patient states pain is less at rest, but increases with any movement.

## 2024-08-08 NOTE — Discharge Instructions (Addendum)
 Shelby [name] has a broken ankle.  The small bone on the outside of her ankle is cracked.  She has been placed in a walking boot for support.  She can utilize the walking boot along with her rolling walker for getting around.  She should rest with the foot elevated.  You can apply ice packs to help reduce swelling.  She can take extra strength Tylenol  1000 mg 3 times daily as needed for pain.  She should follow-up with podiatry for ongoing evaluation.

## 2024-08-08 NOTE — ED Triage Notes (Addendum)
 Arrived by [facility] from home. Multiple falls this week. C/o right knee pain.   Baseline able to ambulate. Today was able to stand and pivot with assistance.   EMS vitals: 136/92 b/p 57HR 98% RA  Abby (son) [phone]

## 2024-08-08 NOTE — ED Notes (Signed)
 Attempted to stand with walker and EDT  Unable to take a step   states having too  much pain Provider aware

## 2024-08-08 NOTE — ED Provider Notes (Signed)
 [facility] Emergency Department Provider Note     Event Date/Time   First MD Initiated Contact with Patient 08/08/24 1030     (approximate)   History   Fall   HPI  Shelby [name] is a 83 y.o. female with a history of dementia, DM, HLD, HTN, CHF, CKD, and peripheral neuropathy, presents to the ED for evaluation of a mechanical fall.  She presents via EMS from home, when she described try to get up this morning and her knees gave out.  She endorses right greater than left knee pain resulting in multiple falls this week.  Following the fall today, she endorsed some right ankle/foot pain.  Patient also endorses generalized weakness.  She denies any fevers, chills, sweats, chest pain, or shortness of breath.  Physical Exam   Triage Vital Signs: ED Triage Vitals  Encounter Vitals Group     BP 08/08/24 1022 122/74     Girls Systolic BP Percentile --      Girls Diastolic BP Percentile --      Boys Systolic BP Percentile --      Boys Diastolic BP Percentile --      Pulse Rate 08/08/24 1021 (!) 54     Resp 08/08/24 1021 18     Temp 08/08/24 1021 98.3 F (36.8 C)     Temp Source 08/08/24 1021 Oral     SpO2 08/08/24 1021 98 %     Weight 08/08/24 1023 178 lb 9.2 oz (81 kg)     Height 08/08/24 1023 5' (1.524 m)     Head Circumference --      Peak Flow --      Pain Score 08/08/24 1021 6     Pain Loc --      Pain Education --      Exclude from Growth Chart --     Most recent vital signs: Vitals:   08/08/24 1021 08/08/24 1022  BP:  122/74  Pulse: (!) 54   Resp: 18   Temp: 98.3 F (36.8 C)   SpO2: 98%     General Awake, no distress. NAD HEENT NCAT. PERRL. EOMI. No rhinorrhea. Mucous membranes are moist.  CV:  Good peripheral perfusion. RRR RESP:  Normal effort. CTA ABD:  No distention. Soft, nonteder MSK:  AROM of bilateral lower extremities.  Right ankle with some lateral soft tissue swelling at the malleolus.  No calf or Achilles  tenderness is elicited.   ED Results / Procedures / Treatments   Labs (all labs ordered are listed, but only abnormal results are displayed) Labs Reviewed  CBC - Abnormal; Notable for the following components:      Result Value   Platelets 141 (*)    All other components within normal limits  COMPREHENSIVE METABOLIC PANEL WITH GFR - Abnormal; Notable for the following components:   Potassium 3.4 (*)    Chloride 96 (*)    BUN 34 (*)    Creatinine, Ser 2.12 (*)    Calcium  11.0 (*)    Albumin  3.4 (*)    GFR, Estimated 23 (*)    All other components within normal limits  URINALYSIS, ROUTINE W REFLEX MICROSCOPIC - Abnormal; Notable for the following components:   Color, Urine YELLOW (*)    APPearance HAZY (*)    Ketones, ur 5 (*)    All other components within normal limits     EKG   RADIOLOGY  I personally viewed and evaluated these images  as part of my medical decision making, as well as reviewing the written report by the radiologist.  ED Provider Interpretation: Oblique fracture of the distal fibula; degenerative changes of the knees without acute fracture  DG Ankle 2 Views Right Result Date: 08/08/2024 EXAM: 2 VIEW(S) XRAY OF THE RIGHT ANKLE 08/08/2024 11:25:00 AM CLINICAL HISTORY: Pain/swelling. Arrived by [facility] from home. Multiple falls this week. C/o right ankle pain. COMPARISON: None available. FINDINGS: BONES AND JOINTS: Oblique fracture of the distal fibula. Fracture fragments distracted 2 mm. No ankle subluxation. SOFT TISSUES: Anterior and lateral soft tissue swelling. Vascular calcifications. IMPRESSION: 1. Oblique fracture of the distal fibula with 2 mm distraction of fracture fragments. No ankle subluxation. 2. Anterior and lateral soft tissue swelling. Electronically signed by: Selinda Blue MD 08/08/2024 11:50 AM EDT RP Workstation: [id number]   DG Knee Complete 4 Views Right Result Date: 08/08/2024 CLINICAL DATA:  fall EXAM: RIGHT KNEE - COMPLETE 4+ VIEW; LEFT  KNEE - COMPLETE 4+ VIEW COMPARISON:  None Available. FINDINGS: RIGHT knee: No acute fracture or dislocation. Mild to moderate joint space narrowing and osteophyte formation of the medial compartment. Mild osteophyte formation of the lateral and patellofemoral compartments. No area of erosion or osseous destruction. No unexpected radiopaque foreign body. Soft tissues are unremarkable. LEFT knee: No acute fracture or dislocation. Mild-to-moderate joint space narrowing and osteophyte formation of the medial compartment. Mild osteophyte formation of the patellofemoral compartment. Bidirectional patellar enthesophytes. No area of erosion or osseous destruction. No unexpected radiopaque foreign body. Soft tissues are unremarkable. IMPRESSION: 1. No acute fracture or dislocation. 2. Mild-to-moderate degenerative changes of the bilateral medial compartments. Electronically Signed   By: Corean Salter M.D.   On: 08/08/2024 11:13   DG Knee Complete 4 Views Left Result Date: 08/08/2024 CLINICAL DATA:  fall EXAM: RIGHT KNEE - COMPLETE 4+ VIEW; LEFT KNEE - COMPLETE 4+ VIEW COMPARISON:  None Available. FINDINGS: RIGHT knee: No acute fracture or dislocation. Mild to moderate joint space narrowing and osteophyte formation of the medial compartment. Mild osteophyte formation of the lateral and patellofemoral compartments. No area of erosion or osseous destruction. No unexpected radiopaque foreign body. Soft tissues are unremarkable. LEFT knee: No acute fracture or dislocation. Mild-to-moderate joint space narrowing and osteophyte formation of the medial compartment. Mild osteophyte formation of the patellofemoral compartment. Bidirectional patellar enthesophytes. No area of erosion or osseous destruction. No unexpected radiopaque foreign body. Soft tissues are unremarkable. IMPRESSION: 1. No acute fracture or dislocation. 2. Mild-to-moderate degenerative changes of the bilateral medial compartments. Electronically Signed   By:  Corean Salter M.D.   On: 08/08/2024 11:13    PROCEDURES:  Critical Care performed: No  Procedures   MEDICATIONS ORDERED IN ED: Medications  traMADol  (ULTRAM ) tablet 50 mg (50 mg Oral Given 08/08/24 1123)     IMPRESSION / MDM / ASSESSMENT AND PLAN / ED COURSE  I reviewed the triage vital signs and the nursing notes.                              Differential diagnosis includes, but is not limited to, DJD, OA, fracture, dislocation, effusion, UTI, electrolyte abnormality  Patient's presentation is most consistent with acute complicated illness / injury requiring diagnostic workup.  Patient's diagnosis is consistent with mechanical fall at home, likely resulting in a closed right distal fibular fracture.  Patient with recent exam and workup at this time.  No report of any head injury and no  change from baseline according to her son.  Plain from x-rays are negative for any acute findings of the knees bilaterally, but does show the fracture morphology of the right ankle based on interpretation.  Patient is placed in a cam boot to ambulate with WBAT using her rolling walker.  She is also given RICE instructions.  Patient will be discharged home with instructions to take OTC Tylenol  3 times daily. Patient is to follow up with podiatry for further fracture care, as needed or otherwise directed. Patient is given ED precautions to return to the ED for any worsening or new symptoms.   FINAL CLINICAL IMPRESSION(S) / ED DIAGNOSES   Final diagnoses:  Closed fracture of distal end of right fibula, unspecified fracture morphology, initial encounter  Fall in home, initial encounter     Rx / DC Orders   ED Discharge Orders     None        Note:  This document was prepared using Dragon voice recognition software and may include unintentional dictation errors.    Loyd Candida LULLA Aldona, PA-C 08/08/24 1345    Arlander Charleston, MD 08/08/24 [phone]

## 2024-08-08 NOTE — ED Notes (Signed)
 See triage note  Presents s/p fall  States she was getting up  her walker moved and she fell   Landed on both knees  Increased pain to right knee  States she was not able to get up on her own

## 2024-08-08 NOTE — ED Triage Notes (Signed)
 Pt to Ed via [facility] from home. Pt reports was trying to get up this am and both knees gave out. Pt reports feels more weak. C/o bilateral knee pain that is worse in right. Pt reports multiple falls this week.

## 2024-08-12 ENCOUNTER — Ambulatory Visit
Admission: RE | Admit: 2024-08-12 | Discharge: 2024-08-12 | Disposition: A | Discharge status: Home / Self Care | Source: Ambulatory Visit | Attending: Podiatry | Admitting: Podiatry

## 2024-08-12 ENCOUNTER — Other Ambulatory Visit: Payer: Self-pay | Admitting: Podiatry

## 2024-08-12 DIAGNOSIS — R600 Localized edema: Secondary | ICD-10-CM | POA: Diagnosis present

## 2024-08-18 ENCOUNTER — Other Ambulatory Visit: Payer: Self-pay

## 2024-08-18 ENCOUNTER — Emergency Department

## 2024-08-18 ENCOUNTER — Observation Stay
Admission: EM | Admit: 2024-08-18 | Discharge: 2024-08-25 | Disposition: A | Discharge status: Skilled Nursing Facility | Attending: Internal Medicine | Admitting: Internal Medicine

## 2024-08-18 DIAGNOSIS — F0390 Unspecified dementia without behavioral disturbance: Secondary | ICD-10-CM | POA: Diagnosis not present

## 2024-08-18 DIAGNOSIS — N390 Urinary tract infection, site not specified: Principal | ICD-10-CM | POA: Insufficient documentation

## 2024-08-18 DIAGNOSIS — E1142 Type 2 diabetes mellitus with diabetic polyneuropathy: Secondary | ICD-10-CM | POA: Insufficient documentation

## 2024-08-18 DIAGNOSIS — S8264XA Nondisplaced fracture of lateral malleolus of right fibula, initial encounter for closed fracture: Secondary | ICD-10-CM | POA: Diagnosis not present

## 2024-08-18 DIAGNOSIS — N3000 Acute cystitis without hematuria: Secondary | ICD-10-CM

## 2024-08-18 DIAGNOSIS — F419 Anxiety disorder, unspecified: Secondary | ICD-10-CM | POA: Diagnosis not present

## 2024-08-18 DIAGNOSIS — X58XXXA Exposure to other specified factors, initial encounter: Secondary | ICD-10-CM | POA: Insufficient documentation

## 2024-08-18 DIAGNOSIS — R531 Weakness: Secondary | ICD-10-CM | POA: Diagnosis present

## 2024-08-18 DIAGNOSIS — N3001 Acute cystitis with hematuria: Secondary | ICD-10-CM | POA: Diagnosis not present

## 2024-08-18 DIAGNOSIS — N179 Acute kidney failure, unspecified: Secondary | ICD-10-CM | POA: Diagnosis not present

## 2024-08-18 DIAGNOSIS — E785 Hyperlipidemia, unspecified: Secondary | ICD-10-CM | POA: Diagnosis not present

## 2024-08-18 DIAGNOSIS — I130 Hypertensive heart and chronic kidney disease with heart failure and stage 1 through stage 4 chronic kidney disease, or unspecified chronic kidney disease: Secondary | ICD-10-CM | POA: Insufficient documentation

## 2024-08-18 DIAGNOSIS — L259 Unspecified contact dermatitis, unspecified cause: Secondary | ICD-10-CM | POA: Diagnosis not present

## 2024-08-18 DIAGNOSIS — Z79899 Other long term (current) drug therapy: Secondary | ICD-10-CM | POA: Diagnosis not present

## 2024-08-18 DIAGNOSIS — E1122 Type 2 diabetes mellitus with diabetic chronic kidney disease: Secondary | ICD-10-CM | POA: Insufficient documentation

## 2024-08-18 DIAGNOSIS — N1832 Chronic kidney disease, stage 3b: Secondary | ICD-10-CM | POA: Diagnosis not present

## 2024-08-18 DIAGNOSIS — I5022 Chronic systolic (congestive) heart failure: Secondary | ICD-10-CM | POA: Insufficient documentation

## 2024-08-18 DIAGNOSIS — Z7982 Long term (current) use of aspirin: Secondary | ICD-10-CM | POA: Insufficient documentation

## 2024-08-18 LAB — CBC
HCT: 40.9 % (ref 36.0–46.0)
Hemoglobin: 12.6 g/dL (ref 12.0–15.0)
MCH: 30.3 pg (ref 26.0–34.0)
MCHC: 30.8 g/dL (ref 30.0–36.0)
MCV: 98.3 fL (ref 80.0–100.0)
Platelets: 179 K/uL (ref 150–400)
RBC: 4.16 MIL/uL (ref 3.87–5.11)
RDW: 14.7 % (ref 11.5–15.5)
WBC: 9.2 K/uL (ref 4.0–10.5)
nRBC: 0 % (ref 0.0–0.2)

## 2024-08-18 LAB — URINALYSIS, ROUTINE W REFLEX MICROSCOPIC
Bilirubin Urine: NEGATIVE
Glucose, UA: NEGATIVE mg/dL
Hgb urine dipstick: NEGATIVE
Ketones, ur: NEGATIVE mg/dL
Nitrite: NEGATIVE
Protein, ur: NEGATIVE mg/dL
Specific Gravity, Urine: 1.011 (ref 1.005–1.030)
WBC, UA: >50 WBC/hpf (ref 0–5)
pH: 6 (ref 5.0–8.0)

## 2024-08-18 LAB — RESP PANEL BY RT-PCR (RSV, FLU A&B, COVID)  RVPGX2
Influenza A by PCR: NEGATIVE
Influenza B by PCR: NEGATIVE
Resp Syncytial Virus by PCR: NEGATIVE
SARS Coronavirus 2 by RT PCR: NEGATIVE

## 2024-08-18 LAB — BASIC METABOLIC PANEL WITH GFR
Anion gap: 12 (ref 5–15)
BUN: 74 mg/dL — ABNORMAL HIGH (ref 8–23)
CO2: 34 mmol/L — ABNORMAL HIGH (ref 22–32)
Calcium: 11.4 mg/dL — ABNORMAL HIGH (ref 8.9–10.3)
Chloride: 99 mmol/L (ref 98–111)
Creatinine, Ser: 2.52 mg/dL — ABNORMAL HIGH (ref 0.44–1.00)
GFR, Estimated: 19 mL/min — ABNORMAL LOW (ref >60)
Glucose, Bld: 162 mg/dL — ABNORMAL HIGH (ref 70–99)
Potassium: 3.9 mmol/L (ref 3.5–5.1)
Sodium: 145 mmol/L (ref 135–145)

## 2024-08-18 LAB — TROPONIN I (HIGH SENSITIVITY)
Troponin I (High Sensitivity): 23 ng/L — ABNORMAL HIGH (ref <18)
Troponin I (High Sensitivity): 22 ng/L — ABNORMAL HIGH (ref <18)

## 2024-08-18 MED ORDER — ACETAMINOPHEN 325 MG PO TABS
650.0000 mg | ORAL_TABLET | Freq: Four times a day (QID) | ORAL | Status: DC | PRN
  Administered 2024-08-25: 650 mg via ORAL

## 2024-08-18 MED ORDER — ONDANSETRON HCL 4 MG/2ML IJ SOLN: 4.0000 mg | Freq: Four times a day (QID) | INTRAMUSCULAR | Status: DC | PRN

## 2024-08-18 MED ORDER — HYDROCHLOROTHIAZIDE 12.5 MG PO TABS
12.5000 mg | ORAL_TABLET | Freq: Every day | ORAL | Status: DC
  Administered 2024-08-19 – 2024-08-25 (×7): 12.5 mg via ORAL
  Filled 2024-08-18 (×8): qty 1

## 2024-08-18 MED ORDER — DONEPEZIL HCL 5 MG PO TABS
10.0000 mg | ORAL_TABLET | Freq: Every day | ORAL | Status: DC
  Administered 2024-08-18 – 2024-08-24 (×7): 10 mg via ORAL
  Filled 2024-08-18 (×7): qty 2

## 2024-08-18 MED ORDER — ACETAMINOPHEN 650 MG RE SUPP: 650.0000 mg | Freq: Four times a day (QID) | RECTAL | Status: DC | PRN

## 2024-08-18 MED ORDER — GABAPENTIN 300 MG PO CAPS
300.0000 mg | ORAL_CAPSULE | Freq: Three times a day (TID) | ORAL | Status: DC
  Administered 2024-08-18 – 2024-08-25 (×22): 300 mg via ORAL
  Filled 2024-08-18 (×22): qty 1

## 2024-08-18 MED ORDER — SODIUM CHLORIDE 0.9 % IV SOLN
1.0000 g | INTRAVENOUS | Status: DC
  Administered 2024-08-19 – 2024-08-20 (×2): 1 g via INTRAVENOUS
  Filled 2024-08-18 (×3): qty 10

## 2024-08-18 MED ORDER — ALBUTEROL SULFATE (2.5 MG/3ML) 0.083% IN NEBU: 2.5000 mg | INHALATION_SOLUTION | RESPIRATORY_TRACT | Status: DC | PRN

## 2024-08-18 MED ORDER — ATORVASTATIN CALCIUM 20 MG PO TABS
80.0000 mg | ORAL_TABLET | Freq: Every day | ORAL | Status: DC
  Administered 2024-08-19 – 2024-08-25 (×7): 80 mg via ORAL
  Filled 2024-08-18 (×7): qty 4

## 2024-08-18 MED ORDER — SODIUM CHLORIDE 0.9 % IV SOLN
1.0000 g | Freq: Once | INTRAVENOUS | Status: AC
  Administered 2024-08-18: 1 g via INTRAVENOUS
  Filled 2024-08-18: qty 10

## 2024-08-18 MED ORDER — POLYETHYLENE GLYCOL 3350 17 G PO PACK
17.0000 g | PACK | Freq: Every day | ORAL | Status: DC
  Administered 2024-08-19 – 2024-08-25 (×6): 17 g via ORAL
  Filled 2024-08-18 (×7): qty 1

## 2024-08-18 MED ORDER — AMLODIPINE BESYLATE 10 MG PO TABS
10.0000 mg | ORAL_TABLET | Freq: Every day | ORAL | Status: DC
  Administered 2024-08-19 – 2024-08-25 (×6): 10 mg via ORAL
  Filled 2024-08-18 (×6): qty 1

## 2024-08-18 MED ORDER — SODIUM CHLORIDE 0.9 % IV BOLUS
1000.0000 mL | Freq: Once | INTRAVENOUS | Status: AC
  Administered 2024-08-18: 1000 mL via INTRAVENOUS

## 2024-08-18 MED ORDER — BISACODYL 5 MG PO TBEC: 5.0000 mg | DELAYED_RELEASE_TABLET | Freq: Every day | ORAL | Status: DC | PRN

## 2024-08-18 MED ORDER — OXYCODONE HCL 5 MG PO TABS
5.0000 mg | ORAL_TABLET | Freq: Four times a day (QID) | ORAL | Status: DC | PRN
  Administered 2024-08-19 – 2024-08-21 (×3): 5 mg via ORAL
  Filled 2024-08-18 (×3): qty 1

## 2024-08-18 MED ORDER — HEPARIN SODIUM (PORCINE) 5000 UNIT/ML IJ SOLN
5000.0000 [IU] | Freq: Three times a day (TID) | INTRAMUSCULAR | Status: DC
  Administered 2024-08-18 – 2024-08-25 (×21): 5000 [IU] via SUBCUTANEOUS
  Filled 2024-08-18 (×21): qty 1

## 2024-08-18 MED ORDER — ASPIRIN 81 MG PO TBEC
81.0000 mg | DELAYED_RELEASE_TABLET | Freq: Every day | ORAL | Status: DC
  Administered 2024-08-19 – 2024-08-25 (×7): 81 mg via ORAL
  Filled 2024-08-18 (×7): qty 1

## 2024-08-18 MED ORDER — TRAZODONE HCL 50 MG PO TABS
50.0000 mg | ORAL_TABLET | Freq: Every day | ORAL | Status: DC
  Administered 2024-08-19 – 2024-08-24 (×6): 50 mg via ORAL
  Filled 2024-08-18 (×7): qty 1

## 2024-08-18 MED ORDER — ONDANSETRON HCL 4 MG PO TABS: 4.0000 mg | ORAL_TABLET | Freq: Four times a day (QID) | ORAL | Status: DC | PRN

## 2024-08-18 MED ORDER — SODIUM CHLORIDE 0.9 % IV SOLN: INTRAVENOUS | Status: AC

## 2024-08-18 MED ORDER — DAPAGLIFLOZIN PROPANEDIOL 10 MG PO TABS
10.0000 mg | ORAL_TABLET | Freq: Every day | ORAL | Status: DC
  Administered 2024-08-19 – 2024-08-25 (×7): 10 mg via ORAL
  Filled 2024-08-18 (×7): qty 1

## 2024-08-18 NOTE — Progress Notes (Signed)
 Patient admitted from ED today. Patient is alert and oriented X 3.pt has a sacral pressure injuries as well as MASD injuries on her under breast bilaterally. Foam dressing done. +2 edema on her right ankle. Denies any pain.

## 2024-08-18 NOTE — Consult Note (Signed)
 WOC Nurse Consult Note: Reason for Consult: sacral wound  Wound type: 1.  Stage 2 Pressure Injury sacrum and B buttocks pink moist  2.  Partial thickness skin loss underneath both breasts pink  Pressure Injury POA: Yes Measurement: see nursing flowsheet  Wound bed: as above  Drainage (amount, consistency, odor) see nursing flowsheet  Periwound: intact  Dressing procedure/placement/frequency:  Cleanse sacral and buttock wounds with Vashe wound cleanser Soila [phone]) do not rinse and allow to air dry.  Apply Xeroform gauze (Lawson [phone]) to wound beds daily, secure with silicone foam or ABD pad and tape whichever is preferred.  2.  Cleanse underneath breasts with Vashe, allow to air dry. Place Interdry AG as follows: Order Gerlean # [id number] Measure and cut length of InterDry to fit in skin folds that have skin breakdown Tuck InterDry fabric into skin folds in a single layer, allow for 2 inches of overhang from skin edges to allow for wicking to occur May remove to bathe; dry area thoroughly and then tuck into affected areas again Do not apply any creams or ointments when using InterDry DO NOT THROW AWAY FOR 5 DAYS unless soiled with stool DO NOT [facility] product, this will inactivate the silver in the material  New sheet of Interdry should be applied after 5 days of use if patient continues to have skin breakdown     POC dicussed with bedside nurse. WOC team will not follow. Re-consult if further needs arise.   Thank you,    Powell Bar MSN, RN-BC, [employer]

## 2024-08-18 NOTE — H&P (Signed)
 History and Physical  Shelby [name] DOB: 06-09-1941 DOA: 08/18/2024 PCP: Steva Clotilda DEL, NP  Chief Complaint: weakness Historian: patient, niece at bedside  HPI:  Shelby [name] is a 83 y.o. female with a PMH significant for HFrEF, anemia, dilated cardiomyopathy, type 2 diabetes, HTN, HLD, OSA, peripheral neuropathy, CKD 3, obesity, dementia, hyperparathyroidism, osteopenia, open-angle glaucoma, thrombocytopenia, restless leg syndrome, history of seizure-like activity, urinary incontinence. At baseline, they live with family and are mostly dependent for their ADLs.  At baseline she is ambulatory with a walker however, approximately 3 weeks ago she had an ankle fracture and has mostly been immobile since that point which has led to skin breakdown on her hips and sacrum.  They presented from home to the ED on 08/18/2024 with weakness x several days.  She states that since she had her ankle fracture 3 weeks ago, she has been mostly immobile and has had progressively worsening weakness over the last few days.  Denies any specific location of the weakness.  She just overall feels tired.  Does endorse urinary incontinence.  Denies dysuria.  Does not endorse any respiratory symptoms.  She has pain of her right ankle which is being managed conservatively and was last seen by podiatry outpatient on 9/5.  She they performed a lower extremity Doppler which was negative for DVT at that time.  Follows with Dr. Lazarus for nephrology outpatient.  In the ED, it was found that they had stable vital signs.  Significant findings included: Na+ 145, K+ 3.9, glucose 162, creatinine 2.52 (baseline closer to 2).  UA positive for large leukocytes, many bacteria, greater than 50 WBCs.  Cultures pending Chest x-ray: Negative for acute cardiopulmonary processes.  They were initially treated with ceftriaxone , IV fluids.   Patient was admitted to medicine service for further workup and  management of UTI as outlined in detail below.  Assessment/Plan Principal Problem:   Urinary tract infection   UTI-as indicated on urinalysis.  Urine culture was sent and will be reviewed as updated Generalized weakness- in setting of infection and recent injury.  - Continue ceftriaxone  - PT/OT - blood cultures - CBC am   AKI on CKDIIIb- Cr 2.5 elevated above baseline around 2.0  - Continue IV fluids - if not improving tomorrow, consider nephrology consult - avoid nephrotoxic agents - renal dosing meds  Incontinence-associated dermatitis  pressure breakdown of skin- wounds do not appear to be acutely infected - WOC consulted.   POA closed low lateral malleolus fracture, right- treated conservatively by podiatry outpatient - Apply ice and elevation - Analgesia as needed - PT  HFrEF- on carvedilol  at baseline. Holding in setting of bradycardia. Does not appear to be in acute exacerbation. Last echo in 6/11.  - holding home diuretics in AKI setting   Dementia- continue home donepezil , trazodone   HTN- continue home amlodipine , HCTZ  HLD- continue home atorvastatin    Peripheral neuropathy, Type II DM - continue home gabapentin , farxiga   Past Medical History:  Diagnosis Date   Anemia    Cardiomyopathy (HCC)    Ejection Fraction 30-35% per ECHO 2016   CHF (congestive heart failure) (HCC)    Diabetes (HCC)    Dyspnea    Dysrhythmia    Hyperlipidemia    Hypertension    Peripheral neuropathy    Peripheral neuropathy    Sleep apnea     Past Surgical History:  Procedure Laterality Date   ABDOMINAL HYSTERECTOMY     COLONOSCOPY     COLONOSCOPY  N/A 04/01/2018   Procedure: COLONOSCOPY;  Surgeon: Gaylyn Gladis PENNER, MD;  Location: [facility] ENDOSCOPY;  Service: Endoscopy;  Laterality: N/A;   COLONOSCOPY WITH PROPOFOL  N/A 12/29/2017   Procedure: COLONOSCOPY WITH PROPOFOL ;  Surgeon: Gaylyn Gladis PENNER, MD;  Location: [facility] ENDOSCOPY;  Service: Endoscopy;  Laterality: N/A;    ESOPHAGOGASTRODUODENOSCOPY (EGD) WITH PROPOFOL  N/A 12/29/2017   Procedure: ESOPHAGOGASTRODUODENOSCOPY (EGD) WITH PROPOFOL ;  Surgeon: Gaylyn Gladis PENNER, MD;  Location: [facility] ENDOSCOPY;  Service: Endoscopy;  Laterality: N/A;   ESOPHAGOGASTRODUODENOSCOPY (EGD) WITH PROPOFOL  N/A 04/01/2018   Procedure: ESOPHAGOGASTRODUODENOSCOPY (EGD) WITH PROPOFOL ;  Surgeon: Gaylyn Gladis PENNER, MD;  Location: [facility] ENDOSCOPY;  Service: Endoscopy;  Laterality: N/A;   EUS N/A 05/20/2018   Procedure: ESOPHAGEAL ENDOSCOPIC ULTRASOUND (EUS) RADIAL;  Surgeon: Queenie Asberry LABOR, MD;  Location: [facility] ENDOSCOPY;  Service: Gastroenterology;  Laterality: N/A;     reports that she has never smoked. She has never used smokeless tobacco. She reports that she does not drink alcohol and does not use drugs.  Allergies  Allergen Reactions   Furosemide  Other (See Comments)    Legs swelling     Family History  Problem Relation Age of Onset   Diabetes Mother    Heart disease Mother    Bladder Cancer Neg Hx    Kidney cancer Neg Hx     Prior to Admission medications   Medication Sig Start Date End Date Taking? Authorizing Provider  amLODipine  (NORVASC ) 5 MG tablet Take 10 mg by mouth daily. 12/11/15   [provider]  ascorbic acid  (VITAMIN C ) 500 MG tablet Take 500 mg by mouth daily.    [provider]  aspirin  EC 81 MG tablet Take 81 mg by mouth daily.    [provider]  atorvastatin  (LIPITOR) 80 MG tablet Take 80 mg by mouth daily.  12/11/15   [provider]  brimonidine (ALPHAGAN) 0.2 % ophthalmic solution Place 1 drop into both eyes 2 (two) times daily.    [provider]  carvedilol  (COREG ) 25 MG tablet Take by mouth 2 (two) times daily with a meal.  07/15/16   [provider]  dapagliflozin  propanediol (FARXIGA ) 10 MG TABS tablet Take 1 tablet by mouth daily. 03/01/24   [provider]  donepezil  (ARICEPT ) 10 MG tablet Take 10 mg by mouth at bedtime.    [provider]  ferrous sulfate  325 (65 FE) MG tablet Take by mouth. Takes one pill every 2-3 days to minimize constipation    [provider]  gabapentin  (NEURONTIN ) 300 MG capsule Take 300 mg by mouth 3 (three) times daily.    [provider]  hydrochlorothiazide  (MICROZIDE ) 12.5 MG capsule Take 12.5 mg by mouth daily. 03/01/24   [provider]  magnesium  oxide (MAG-OX) 400 MG tablet Take 400 mg by mouth daily.     [provider]  mirabegron  ER (MYRBETRIQ ) 50 MG TB24 tablet Take 1 tablet (50 mg total) by mouth daily. 12/28/20   Penne Knee, MD  Multiple Vitamin (MULTI-VITAMINS) TABS Take 1 tablet by mouth daily.  Patient not taking: Reported on 05/18/2024    [provider]  Omega-3 Fatty Acids (FISH OIL) 1000 MG CAPS Take 1,000 mg by mouth daily. Patient not taking: Reported on 07/01/2023    [provider]  torsemide  (DEMADEX ) 20 MG tablet Take 1 tablet (20 mg total) by mouth daily. 02/05/23   Gonfa, Taye T, MD  travoprost , benzalkonium, (TRAVATAN ) 0.004 % ophthalmic solution Place 1 drop into both eyes at  bedtime.     [provider]  traZODone  (DESYREL ) 50 MG tablet Take 1 tablet by mouth at bedtime. 01/14/23 03/01/25  [provider]   I have personally, briefly reviewed patient's prior medical records in [place] [facility]  Objective: Blood pressure (!) 156/136, pulse 64, temperature 98.3 F (36.8 C), resp. rate 16, height 5' (1.524 m), weight 81 kg, SpO2 95%.   Constitutional: NAD, calm, comfortable HEENT: lids and conjunctivae normal. Dry mucous membranes. Hard of hearing  Respiratory: CTAB, no wheezing, no crackles. Normal respiratory effort. No accessory muscle use.  Cardiovascular: RRR, no murmurs / rubs / gallops. Abdomen: soft, NT, ND, no masses or HSM palpated. Musculoskeletal: non-pitting edema of R ankle. Tenderness to palpation. ROM limited 2/2 pain.  Skin: shallow skin breakdown on sacrum and R hip with  healthy granulation tissue present.  Neurologic: Alert and oriented x 3. Low-volume speech. Grossly non-focal exam. PERRL Psychiatric: Normal mood. Congruent affect.  Labs on Admission: I have personally reviewed admission labs and imaging studies  CBC    Component Value Date/Time   WBC 9.2 08/18/2024 1137   RBC 4.16 08/18/2024 1137   HGB 12.6 08/18/2024 1137   HCT 40.9 08/18/2024 1137   PLT 179 08/18/2024 1137   MCV 98.3 08/18/2024 1137   MCH 30.3 08/18/2024 1137   MCHC 30.8 08/18/2024 1137   RDW 14.7 08/18/2024 1137   LYMPHSABS 2.0 07/21/2024 1050   MONOABS 0.6 07/21/2024 1050   EOSABS 0.1 07/21/2024 1050   BASOSABS 0.0 07/21/2024 1050   CMP     Component Value Date/Time   NA 145 08/18/2024 1137   NA 141 02/16/2018 0919   K 3.9 08/18/2024 1137   CL 99 08/18/2024 1137   CO2 34 (H) 08/18/2024 1137   GLUCOSE 162 (H) 08/18/2024 1137   BUN 74 (H) 08/18/2024 1137   BUN 24 02/16/2018 0919   CREATININE 2.52 (H) 08/18/2024 1137   CALCIUM  11.4 (H) 08/18/2024 1137   PROT 6.8 08/08/2024 1024   ALBUMIN  3.4 (L) 08/08/2024 1024   AST 24 08/08/2024 1024   ALT 12 08/08/2024 1024   ALKPHOS 62 08/08/2024 1024   BILITOT 0.7 08/08/2024 1024   GFRNONAA 19 (L) 08/18/2024 1137   GFRAA 56 (L) 01/15/2019 0821   Radiological Exams on Admission: DG Chest 2 View Result Date: 08/18/2024 CLINICAL DATA:  Weakness. EXAM: CHEST - 2 VIEW COMPARISON:  Radiographs 07/21/2024 and 05/17/2024. Cardiac CT 08/24/2023. FINDINGS: 1151 hours. Mild patient rotation to the right. The heart size and mediastinal contours are stable with aortic atherosclerosis. Chronic elevation of the right hemidiaphragm with associated chronic bibasilar atelectasis or scarring. There is no edema, confluent airspace disease, pleural effusion or pneumothorax. Old fracture of the left 6th rib noted. No acute osseous findings are seen. IMPRESSION: No evidence of acute cardiopulmonary process. Chronic elevation of the right  hemidiaphragm with associated bibasilar atelectasis or scarring. Electronically Signed   By: Elsie Perone M.D.   On: 08/18/2024 12:01   EKG: Independently reviewed. Getting repeat. Too much artifact  DVT prophylaxis: heparin  injection 5,000 Units Start: 08/18/24 1430  Code Status: full  Family Communication: niece at bedside  Disposition Plan: admit for obs  Consults called: none    Marien LITTIE Piety, DO [facility]  08/18/2024, 2:27 PM    To contact the appropriate [facility] Attending or Consulting provider: Check [email or web address] for coverage from 7pm-7am

## 2024-08-18 NOTE — ED Provider Notes (Signed)
 [facility] Provider Note    Event Date/Time   First MD Initiated Contact with Patient 08/18/24 1249     (approximate)   History   No chief complaint on file.   HPI  Shelby [name] is a 83 y.o. female who presents to the ED for evaluation of No chief complaint on file.   Reviewed podiatry clinic visit from 9/5, follow-up from ED visit 9/1 where she fell at home causing distal right fibular fracture.  Outpatient venous ultrasound of the right leg from 9/5 without DVT.  Patient presents with her daughter due to profound weakness over the past couple days.  Unable to get up and do any of her normal activities   Physical Exam   Triage Vital Signs: ED Triage Vitals  Encounter Vitals Group     BP 08/18/24 1133 (!) 130/110     Girls Systolic BP Percentile --      Girls Diastolic BP Percentile --      Boys Systolic BP Percentile --      Boys Diastolic BP Percentile --      Pulse Rate 08/18/24 1133 (!) 58     Resp 08/18/24 1133 18     Temp 08/18/24 1133 98.3 F (36.8 C)     Temp src --      SpO2 08/18/24 1133 93 %     Weight 08/18/24 1134 178 lb 9.2 oz (81 kg)     Height 08/18/24 1134 5' (1.524 m)     Head Circumference --      Peak Flow --      Pain Score --      Pain Loc --      Pain Education --      Exclude from Growth Chart --     Most recent vital signs: Vitals:   08/18/24 1256 08/18/24 1257  BP: (!) 156/136   Pulse:  64  Resp: 18 16  Temp:    SpO2:  95%    General: Awake, no distress.  CV:  Good peripheral perfusion.  Resp:  Normal effort.  Abd:  No distention.  MSK:  No deformity noted.  Neuro:  No focal deficits appreciated. Other:     ED Results / Procedures / Treatments   Labs (all labs ordered are listed, but only abnormal results are displayed) Labs Reviewed  BASIC METABOLIC PANEL WITH GFR - Abnormal; Notable for the following components:      Result Value   CO2 34 (*)    Glucose, Bld 162 (*)    BUN 74  (*)    Creatinine, Ser 2.52 (*)    Calcium  11.4 (*)    GFR, Estimated 19 (*)    All other components within normal limits  URINALYSIS, ROUTINE W REFLEX MICROSCOPIC - Abnormal; Notable for the following components:   Color, Urine YELLOW (*)    APPearance CLOUDY (*)    Leukocytes,Ua LARGE (*)    Bacteria, UA MANY (*)    All other components within normal limits  TROPONIN I (HIGH SENSITIVITY) - Abnormal; Notable for the following components:   Troponin I (High Sensitivity) 23 (*)    All other components within normal limits  TROPONIN I (HIGH SENSITIVITY) - Abnormal; Notable for the following components:   Troponin I (High Sensitivity) 22 (*)    All other components within normal limits  RESP PANEL BY RT-PCR (RSV, FLU A&B, COVID)  [id number]  URINE CULTURE  CULTURE, BLOOD (ROUTINE X 2)  CULTURE, BLOOD (ROUTINE X 2)  CBC    EKG   RADIOLOGY CXR interpreted by me without evidence of acute cardiopulmonary pathology.  Official radiology report(s): DG Chest 2 View Result Date: 08/18/2024 CLINICAL DATA:  Weakness. EXAM: CHEST - 2 VIEW COMPARISON:  Radiographs 07/21/2024 and 05/17/2024. Cardiac CT 08/24/2023. FINDINGS: 1151 hours. Mild patient rotation to the right. The heart size and mediastinal contours are stable with aortic atherosclerosis. Chronic elevation of the right hemidiaphragm with associated chronic bibasilar atelectasis or scarring. There is no edema, confluent airspace disease, pleural effusion or pneumothorax. Old fracture of the left 6th rib noted. No acute osseous findings are seen. IMPRESSION: No evidence of acute cardiopulmonary process. Chronic elevation of the right hemidiaphragm with associated bibasilar atelectasis or scarring. Electronically Signed   By: Elsie Perone M.D.   On: 08/18/2024 12:01    PROCEDURES and INTERVENTIONS:  Procedures  Medications  acetaminophen  (TYLENOL ) tablet 650 mg (has no administration in time range)    Or  acetaminophen  (TYLENOL )  suppository 650 mg (has no administration in time range)  polyethylene glycol (MIRALAX  / GLYCOLAX ) packet 17 g (has no administration in time range)  bisacodyl  (DULCOLAX) EC tablet 5 mg (has no administration in time range)  ondansetron  (ZOFRAN ) tablet 4 mg (has no administration in time range)    Or  ondansetron  (ZOFRAN ) injection 4 mg (has no administration in time range)  albuterol  (PROVENTIL ) (2.5 MG/3ML) 0.083% nebulizer solution 2.5 mg (has no administration in time range)  heparin  injection 5,000 Units (has no administration in time range)  0.9 %  sodium chloride  infusion ( Intravenous New Bag/Given 08/18/24 1508)  oxyCODONE  (Oxy IR/ROXICODONE ) immediate release tablet 5 mg (has no administration in time range)  amLODipine  (NORVASC ) tablet 10 mg (has no administration in time range)  aspirin  EC tablet 81 mg (has no administration in time range)  atorvastatin  (LIPITOR) tablet 80 mg (has no administration in time range)  traZODone  (DESYREL ) tablet 50 mg (has no administration in time range)  hydrochlorothiazide  (MICROZIDE ) capsule 12.5 mg (has no administration in time range)  gabapentin  (NEURONTIN ) capsule 300 mg (has no administration in time range)  donepezil  (ARICEPT ) tablet 10 mg (has no administration in time range)  dapagliflozin  propanediol (FARXIGA ) tablet 10 mg (has no administration in time range)  sodium chloride  0.9 % bolus 1,000 mL (0 mLs Intravenous Stopped 08/18/24 1508)  cefTRIAXone  (ROCEPHIN ) 1 g in sodium chloride  0.9 % 100 mL IVPB (0 g Intravenous Stopped 08/18/24 1508)     IMPRESSION / MDM / ASSESSMENT AND PLAN / ED COURSE  I reviewed the triage vital signs and the nursing notes.  Differential diagnosis includes, but is not limited to, deconditioning, AKI, sepsis, viral syndrome  {Patient presents with symptoms of an acute illness or injury that is potentially life-threatening.  Patient presents with a couple days of profound weakness after being less mobile after  an ankle fracture, signs of acute UTI requiring medical admission.  Slight worsening of renal dysfunction/AKI that I suspect is prerenal, she appears quite dry.  Normal CBC, urine is infectious and sent for culture, negative COVID swab.  Start antibiotics and consult medicine for admission.  Clinical Course as of 08/18/24 1516  Thu Aug 18, 2024  1422 Consult with medicine who agrees to admit [DS]    Clinical Course User Index [DS] Claudene Rover, MD     FINAL CLINICAL IMPRESSION(S) / ED DIAGNOSES   Final diagnoses:  Generalized weakness  Acute cystitis without hematuria     Rx /  DC Orders   ED Discharge Orders     None        Note:  This document was prepared using Dragon voice recognition software and may include unintentional dictation errors.   Claudene Rover, MD 08/18/24 [phone]

## 2024-08-18 NOTE — ED Triage Notes (Signed)
 Pt comes in via pov with complaints of weakness. Pt broke her ankle 2 weeks ago, and has been in bed ever since. Pt went in to see her kidney doctor this morning, and was told to come be seen. PT complains of right knee and ankle pain 10/10.

## 2024-08-18 NOTE — ED Notes (Signed)
 Hospitalist at bedside

## 2024-08-18 NOTE — Consult Note (Signed)
 WOC Nurse Consult Note: Reason for Consult: Requested to assess hip and sacral pressure injuries with MASD related.  Please note that the [facility] nursing team is utilizing a standardized work plan to manage patient consults.  We are triaging consults and will try to see the patients within 48 hours.  Wound photos in the patient's chart allow us  to consult on the patient in the most efficient and timely manner.    Note: secure chat with medical team requesting media.  Thank-you,  Lela Holm RN, CNS, [employer], MSN.  (Phone [phone])

## 2024-08-18 NOTE — ED Notes (Signed)
 ED Provider at bedside.

## 2024-08-19 DIAGNOSIS — N3001 Acute cystitis with hematuria: Secondary | ICD-10-CM | POA: Diagnosis not present

## 2024-08-19 LAB — CBC
HCT: 34.1 % — ABNORMAL LOW (ref 36.0–46.0)
Hemoglobin: 10.8 g/dL — ABNORMAL LOW (ref 12.0–15.0)
MCH: 30.9 pg (ref 26.0–34.0)
MCHC: 31.7 g/dL (ref 30.0–36.0)
MCV: 97.7 fL (ref 80.0–100.0)
Platelets: 156 K/uL (ref 150–400)
RBC: 3.49 MIL/uL — ABNORMAL LOW (ref 3.87–5.11)
RDW: 14.8 % (ref 11.5–15.5)
WBC: 8.4 K/uL (ref 4.0–10.5)
nRBC: 0 % (ref 0.0–0.2)

## 2024-08-19 LAB — BASIC METABOLIC PANEL WITH GFR
Anion gap: 8 (ref 5–15)
BUN: 67 mg/dL — ABNORMAL HIGH (ref 8–23)
CO2: 34 mmol/L — ABNORMAL HIGH (ref 22–32)
Calcium: 10.5 mg/dL — ABNORMAL HIGH (ref 8.9–10.3)
Chloride: 106 mmol/L (ref 98–111)
Creatinine, Ser: 2.09 mg/dL — ABNORMAL HIGH (ref 0.44–1.00)
GFR, Estimated: 23 mL/min — ABNORMAL LOW (ref >60)
Glucose, Bld: 99 mg/dL (ref 70–99)
Potassium: 3.6 mmol/L (ref 3.5–5.1)
Sodium: 148 mmol/L — ABNORMAL HIGH (ref 135–145)

## 2024-08-19 MED ORDER — NYSTATIN 100000 UNIT/GM EX POWD
CUTANEOUS | Status: DC | PRN
  Filled 2024-08-19: qty 15

## 2024-08-19 NOTE — Evaluation (Signed)
 Occupational Therapy Evaluation Patient Details Name: Shelby [name] MRN: [id number] DOB: [birth year] Today's Date: 08/19/2024   History of Present Illness   Pt is an 83 y/o F presenting to ED with c/o weakness. Recent ED visit on 9/1 after a fall that resulted in a L distal fibula fracture, closed nondisplaced fracture of right lateral malleolus. PMH significant for HFrEF, anemia, T2DM, HTN, HLD, OSA, peripheral neuropathy, CKD-III, dementia, glaucoma.   Clinical Impressions Pt was seen for OT evaluation and co-tx with PT this date. Prior to hospital admission, pt was recently very limited in mobility 2/2 recent L distal fibula fracture. Pt lives with her son and her niece comes by often to assist with ADL. Pt presents with deficits in strength, balance, activity tolerance, and cognition, affecting safe and optimal ADL completion. Pt currently requires MAX A +2 for bed mobility, MAX A for bed level LB ADL, TOTAL A +2 for STS attempts, and set up for self feeding. Pt initially required MAX A for static sitting balance, improving with time and dynamic reaching to CGA. Pt would benefit from skilled OT services to address noted impairments and functional limitations (see below for any additional details) in order to maximize safety and independence while minimizing future risk of falls, injury, and readmission. Anticipate the need for follow up OT services upon acute hospital DC.    If plan is discharge home, recommend the following:   Two people to help with walking and/or transfers;A lot of help with bathing/dressing/bathroom;Direct supervision/assist for medications management;Supervision due to cognitive status;Direct supervision/assist for financial management;Assist for transportation;Assistance with cooking/housework;Assistance with feeding;Help with stairs or ramp for entrance     Functional Status Assessment   Patient has had a recent decline in their functional status and  demonstrates the ability to make significant improvements in function in a reasonable and predictable amount of time.     Equipment Recommendations   Other (comment) (defer)     Recommendations for Other Services         Precautions/Restrictions   Precautions Precautions: Fall Recall of Precautions/Restrictions: Intact Precaution/Restrictions Comments: per recent podiatry note, pt is instructed to wear a boot on L foot for mobility, not present in room at eval Restrictions Other Position/Activity Restrictions: Per podiatry note on 9/5: Advise use of a boot for weight-bearing activities such as transferring to a wheelchair or using the restroom.  - Instruct to rest and elevate the leg to aid in healing.  - Apply a compression wrap to the ankle to provide support and reduce swelling.     Mobility Bed Mobility Overal bed mobility: Needs Assistance Bed Mobility: Supine to Sit, Sit to Supine     Supine to sit: Max assist, +2 for physical assistance, HOB elevated Sit to supine: Max assist, +2 for physical assistance   General bed mobility comments: able to initiate movement, maxA for trunk and BLE assist    Transfers Overall transfer level: Needs assistance Equipment used: 2 person hand held assist Transfers: Sit to/from Stand Sit to Stand: Total assist, Max assist, +2 physical assistance           General transfer comment: initial STS attempt from EOB with totalAx2, unable to clear bottom from mattress. Additional STS with maxAx2, unable to achieve full standing      Balance Overall balance assessment: Needs assistance Sitting-balance support: Bilateral upper extremity supported, Feet supported Sitting balance-Leahy Scale: Poor Sitting balance - Comments: initially maxA for sitting balance due to posterior lean Postural control: Posterior lean  Standing balance-Leahy Scale: Zero Standing balance comment: unable to fully clear bottom from mattress to achieve  full standing                           ADL either performed or assessed with clinical judgement   ADL Overall ADL's : Needs assistance/impaired Eating/Feeding: Set up;Sitting;Bed level   Grooming: Sitting;Supervision/safety;Wash/dry face;Set up               Lower Body Dressing: Bed level;Maximal assistance                       Vision         Perception         Praxis         Pertinent Vitals/Pain Pain Assessment Pain Assessment: No/denies pain     Extremity/Trunk Assessment Upper Extremity Assessment Upper Extremity Assessment: Generalized weakness   Lower Extremity Assessment Lower Extremity Assessment: Generalized weakness LLE Deficits / Details: hx of L distal fib fx, minimal active movement of LLE LLE Coordination: decreased gross motor;decreased fine motor       Communication Communication Communication: No apparent difficulties   Cognition Arousal: Alert Behavior During Therapy: WFL for tasks assessed/performed Cognition: History of cognitive impairments, No family/caregiver present to determine baseline                               Following commands: Impaired Following commands impaired: Follows one step commands with increased time     Cueing  General Comments   Cueing Techniques: Verbal cues;Gestural cues;Visual cues;Tactile cues      Exercises     Shoulder Instructions      Home Living Family/patient expects to be discharged to:: Private residence Living Arrangements: Children (son) Available Help at Discharge: Family;Available PRN/intermittently Type of Home: House Home Access: Ramped entrance     Home Layout: One level                   Additional Comments: pt's son lives with pt now, her niece comes by frequently during the day to assist      Prior Functioning/Environment Prior Level of Function : History of Falls (last six months);Needs assist;Patient poor historian/Family  not available             Mobility Comments: pt reports that she hasn't been out of bed since the ankle fracture. Used cane for household amb prior ADLs Comments: pt's niece helps with dressing, bathing, meds. son assists with housework and cooking    OT Problem List: Decreased strength;Decreased cognition;Decreased activity tolerance;Decreased safety awareness;Impaired balance (sitting and/or standing);Decreased knowledge of use of DME or AE;Decreased knowledge of precautions   OT Treatment/Interventions: Self-care/ADL training;Therapeutic exercise;Cognitive remediation/compensation;Energy conservation;DME and/or AE instruction;Patient/family education;Balance training;Therapeutic activities      OT Goals(Current goals can be found in the care plan section)   Acute Rehab OT Goals Patient Stated Goal: get better OT Goal Formulation: With patient Time For Goal Achievement: 09/02/24 Potential to Achieve Goals: Fair ADL Goals Pt Will Perform Grooming: with set-up;with supervision;sitting Pt Will Transfer to Toilet: with mod assist;bedside commode;stand pivot transfer (LRAD) Pt Will Perform Toileting - Clothing Manipulation and hygiene: sitting/lateral leans;with mod assist   OT Frequency:  Min 2X/week    Co-evaluation PT/OT/SLP Co-Evaluation/Treatment: Yes Reason for Co-Treatment: To address functional/ADL transfers;For patient/therapist safety PT goals addressed during session: Mobility/safety with mobility OT  goals addressed during session: ADL's and self-care      AM-PAC OT 6 Clicks Daily Activity     Outcome Measure Help from another person eating meals?: None Help from another person taking care of personal grooming?: A Little Help from another person toileting, which includes using toliet, bedpan, or urinal?: A Lot Help from another person bathing (including washing, rinsing, drying)?: A Lot Help from another person to put on and taking off regular upper body  clothing?: A Lot Help from another person to put on and taking off regular lower body clothing?: A Lot 6 Click Score: 15   End of Session Equipment Utilized During Treatment: Gait belt  Activity Tolerance: Patient tolerated treatment well Patient left: in bed;with call bell/phone within reach;with bed alarm set  OT Visit Diagnosis: Other abnormalities of gait and mobility (R26.89);Muscle weakness (generalized) (M62.81)                Time: 9058-8994 OT Time Calculation (min): 24 min Charges:  OT General Charges $OT Visit: 1 Visit OT Evaluation $OT Eval Moderate Complexity: 1 Mod  Warren SAUNDERS., MPH, MS, OTR/L ascom [phone] 08/19/24, 12:49 PM

## 2024-08-19 NOTE — Evaluation (Signed)
 Physical Therapy Evaluation Patient Details Name: Shelby [name] MRN: [id number] DOB: 1941-07-24 Today's Date: 08/19/2024  History of Present Illness  Pt is an 83 y/o F presenting to ED with c/o weakness. Recent ED visit on 9/1 after a fall that resulted in a L distal fibula fracture, closed nondisplaced fracture of right lateral malleolus. PMH significant for HFrEF, anemia, T2DM, HTN, HLD, OSA, peripheral neuropathy, CKD-III, dementia, glaucoma.   Clinical Impression  Pt alert, oriented to person and place, agreeable to PT/OT co-evaluation. Pt denied pain throughout session. At baseline, pt reports being immobile since her recent L fibula fx, prior to that she was amb with cane for household distances. Pt requires assistance with most ADLs from her son or niece. Pt was maxAx2 for bed mobility this date with expected increased difficulty actively moving LLE. Pt initially demonstrated poor siting balance, requiring maxA due to posterior lean, improved to CGA with time. 2 STS attempts from EOB, initial attempt totalAx2, second attempt maxAx2 with max multimodal cues to increase pt participation, ultimately unable to achieve full standing this dat. Pt was returned to bed, all needs in reach. Pt is displaying deficits in cognition, LE strength, mobility, and activity tolerance and would benefit from skilled PT intervention to address deficits and improve independence with mobility/ADLs as able.         If plan is discharge home, recommend the following: Two people to help with walking and/or transfers;Two people to help with bathing/dressing/bathroom;Assistance with cooking/housework;Direct supervision/assist for medications management;Assist for transportation;Help with stairs or ramp for entrance;Supervision due to cognitive status   Can travel by private vehicle   No    Equipment Recommendations Other (comment) (TBD- may need hoyer/hospital bed if d/c to home)  Recommendations for Other  Services       Functional Status Assessment Patient has had a recent decline in their functional status and demonstrates the ability to make significant improvements in function in a reasonable and predictable amount of time.     Precautions / Restrictions Precautions Precautions: Fall Recall of Precautions/Restrictions: Intact Precaution/Restrictions Comments: per recent podiatry note, pt is instructed to wear a boot on L foot for mobility, not present in room at eval Restrictions Other Position/Activity Restrictions: Per podiatry note on 9/5: Advise use of a boot for weight-bearing activities such as transferring to a wheelchair or using the restroom.  - Instruct to rest and elevate the leg to aid in healing.  - Apply a compression wrap to the ankle to provide support and reduce swelling.      Mobility  Bed Mobility Overal bed mobility: Needs Assistance Bed Mobility: Supine to Sit, Sit to Supine     Supine to sit: Max assist, +2 for physical assistance, HOB elevated Sit to supine: Max assist, +2 for physical assistance   General bed mobility comments: able to initiate movement, maxA for trunk and BLE assist    Transfers Overall transfer level: Needs assistance Equipment used: 2 person hand held assist Transfers: Sit to/from Stand Sit to Stand: Total assist, Max assist, +2 physical assistance           General transfer comment: initial STS attempt from EOB with totalAx2, unable to clear bottom from mattress. Additional STS with maxAx2, unable to achieve full standing    Ambulation/Gait                  Stairs            Wheelchair Mobility     Tilt Bed  Modified Rankin (Stroke Patients Only)       Balance Overall balance assessment: Needs assistance Sitting-balance support: Bilateral upper extremity supported, Feet supported Sitting balance-Leahy Scale: Poor Sitting balance - Comments: initially maxA for sitting balance due to posterior  lean Postural control: Posterior lean   Standing balance-Leahy Scale: Zero Standing balance comment: unable to fully clear bottom from mattress to achieve full standing                             Pertinent Vitals/Pain Pain Assessment Pain Assessment: No/denies pain    Home Living Family/patient expects to be discharged to:: Private residence Living Arrangements: Children (son) Available Help at Discharge: Family;Available PRN/intermittently Type of Home: House Home Access: Ramped entrance       Home Layout: One level   Additional Comments: pt's son lives with pt now, her niece comes by frequently during the day to assist    Prior Function Prior Level of Function : History of Falls (last six months);Needs assist;Patient poor historian/Family not available             Mobility Comments: pt reports that she hasn't been out of bed since the ankle fracture. Used cane for household amb prior ADLs Comments: pt's niece helps with dressing, bathing, meds. son assists with housework and cooking     Extremity/Trunk Assessment   Upper Extremity Assessment Upper Extremity Assessment: Generalized weakness    Lower Extremity Assessment Lower Extremity Assessment: Generalized weakness LLE Deficits / Details: hx of L distal fib fx, minimal active movement of LLE LLE Coordination: decreased gross motor;decreased fine motor       Communication   Communication Communication: No apparent difficulties    Cognition Arousal: Alert Behavior During Therapy: WFL for tasks assessed/performed   PT - Cognitive impairments: History of cognitive impairments, No family/caregiver present to determine baseline                       PT - Cognition Comments: alert to person & place Following commands: Impaired Following commands impaired: Follows one step commands with increased time     Cueing Cueing Techniques: Verbal cues, Gestural cues, Visual cues, Tactile cues      General Comments      Exercises     Assessment/Plan    PT Assessment Patient needs continued PT services  PT Problem List Decreased strength;Decreased range of motion;Decreased activity tolerance;Decreased balance;Decreased mobility;Decreased coordination;Decreased cognition       PT Treatment Interventions DME instruction;Gait training;Functional mobility training;Therapeutic activities;Therapeutic exercise;Balance training;Neuromuscular re-education;Cognitive remediation;Patient/family education;Wheelchair mobility training    PT Goals (Current goals can be found in the Care Plan section)  Acute Rehab PT Goals Patient Stated Goal: none stated PT Goal Formulation: With patient Time For Goal Achievement: 09/02/24 Potential to Achieve Goals: Poor    Frequency Min 2X/week     Co-evaluation PT/OT/SLP Co-Evaluation/Treatment: Yes Reason for Co-Treatment: To address functional/ADL transfers;For patient/therapist safety PT goals addressed during session: Mobility/safety with mobility OT goals addressed during session: ADL's and self-care       AM-PAC PT 6 Clicks Mobility  Outcome Measure Help needed turning from your back to your side while in a flat bed without using bedrails?: A Lot Help needed moving from lying on your back to sitting on the side of a flat bed without using bedrails?: A Lot Help needed moving to and from a bed to a chair (including a wheelchair)?: Total Help needed  standing up from a chair using your arms (e.g., wheelchair or bedside chair)?: A Lot Help needed to walk in hospital room?: Total Help needed climbing 3-5 steps with a railing? : Total 6 Click Score: 9    End of Session Equipment Utilized During Treatment: Gait belt Activity Tolerance: Patient limited by fatigue Patient left: in bed;with call bell/phone within reach;with bed alarm set Nurse Communication: Mobility status PT Visit Diagnosis: Unsteadiness on feet (R26.81);Other  abnormalities of gait and mobility (R26.89);Muscle weakness (generalized) (M62.81);History of falling (Z91.81);Difficulty in walking, not elsewhere classified (R26.2)    Time: 9057-8996 PT Time Calculation (min) (ACUTE ONLY): 21 min   Charges:   PT Evaluation $PT Eval Low Complexity: 1 Low   PT General Charges $$ ACUTE PT VISIT: 1 Visit         Janell Axe, SPT

## 2024-08-19 NOTE — Plan of Care (Signed)
  Problem: Clinical Measurements: Goal: Ability to maintain clinical measurements within normal limits will improve Outcome: Progressing   Problem: Activity: Goal: Risk for activity intolerance will decrease Outcome: Progressing   Problem: Coping: Goal: Level of anxiety will decrease Outcome: Progressing   Problem: Pain Managment: Goal: General experience of comfort will improve and/or be controlled Outcome: Progressing   Problem: Safety: Goal: Ability to remain free from injury will improve Outcome: Progressing

## 2024-08-19 NOTE — TOC Initial Note (Signed)
 Transition of Care [facility]) - Initial/Assessment Note    Patient Details  Name: Shelby [name] MRN: [id number] Date of Birth: 1941-10-07  [facility]) CM/SW Contact:    Racheal LITTIE Schimke, RN Phone Number: 08/19/2024, 4:37 PM  Clinical Narrative: Per RN assessment patient is oriented to person only. CM spoke with Son, Vinie by phone, who says patient lives alone and unable to care for herself, she's been in the bed and he's been taking her food. Son also states patient has Dementia, often talking to folks that are not there. Son agrees to SNF recommendation, no preference, just want to remain in the area, [place] Son understands referral will be sent to all SNF rehabs in the area and bed offers will be discussed with him to decide.                     Patient Goals and CMS Choice            Expected Discharge Plan and Services                                              Prior Living Arrangements/Services                       Activities of Daily Living      Permission Sought/Granted                  Emotional Assessment              Admission diagnosis:  Urinary tract infection [N39.0] Acute cystitis without hematuria [N30.00] Generalized weakness [R53.1] Patient Active Problem List   Diagnosis Date Noted   Urinary tract infection 08/18/2024   Problem related to unspecified psychosocial circumstances 07/21/2024   Seizure-like activity (HCC) 03/01/2024   Hyperparathyroidism, primary (HCC) 08/04/2023   Chronic idiopathic constipation 07/21/2023   Generalized anxiety disorder 07/21/2023   Syncope 07/01/2023   Type II diabetes mellitus with renal manifestations (HCC) 07/01/2023   HLD (hyperlipidemia) 07/01/2023   Acute renal failure superimposed on stage 3b chronic kidney disease (HCC) 07/01/2023   OAB (overactive bladder) 03/30/2023   Weakness 02/05/2023   Hypoxemia 02/01/2023   AKI (acute kidney injury)  (HCC) 02/01/2023   Elevated d-dimer 02/01/2023   SOB (shortness of breath) 01/31/2023   Chronic diastolic CHF (congestive heart failure) (HCC) 01/31/2023   Hypercalcemia 01/31/2023   Obesity (BMI 30-39.9) 01/31/2023   Pancreatic cyst 05/22/2022   Primary open angle glaucoma (POAG) of left eye, indeterminate stage 05/22/2022   Skin lesion 05/22/2022   Osteopenia of multiple sites 02/17/2022   Non-seasonal allergic rhinitis 11/18/2021   Tremor of unknown origin 11/18/2021   Restless leg syndrome 06/28/2021   Lumbago with sciatica, right side 06/24/2020   Calculus of gallbladder without cholecystitis without obstruction 12/09/2019   Hypertensive heart disease with heart failure (HCC) 12/09/2019   Other chronic pain 12/09/2019   Type 2 diabetes mellitus with diabetic polyneuropathy (HCC) 12/09/2019   Lymphedema 09/03/2018   Multinodular goiter 07/31/2018   Aortic atherosclerosis (HCC) 07/27/2018   Chronic renal insufficiency, stage 3 (moderate) (HCC) 04/19/2018   Pulmonary nodule 04/18/2018   Chronic systolic heart failure (HCC) 03/08/2018   History of nephrolithiasis 01/23/2018   Hypomagnesemia 01/19/2018   Person encountering health services to consult on behalf of another person 01/14/2018  Thrombocytopenia (HCC) 11/03/2017   Unspecified dementia, unspecified severity, without behavioral disturbance, psychotic disturbance, mood disturbance, and anxiety (HCC) 07/30/2017   Urinary incontinence in female 02/23/2017   High risk medication use 12/11/2015   Left lumbar radiculopathy 12/05/2014   Anemia, unspecified 06/12/2014   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   Type 2 diabetes w unsp diabetic rtnop w/o macular edema (HCC) 06/12/2014   Benign essential hypertension 06/12/2014   Hyperlipidemia 06/12/2014   Obstructive sleep apnea 06/12/2014   Peripheral neuropathy 06/12/2014   Proteinuria 06/12/2014   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HCC)  06/12/2014   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) 06/12/2014   Vitamin B 12 deficiency 06/12/2014   Vitamin D deficiency 06/12/2014   Hypertension associated with type 2 diabetes mellitus (HCC) 06/12/2014   Iron deficiency anemia due to chronic blood loss 06/12/2014   PCP:  Steva Clotilda DEL, NP Pharmacy:   [facility] [address]), [place] - [address] [place]) [place] [address] Phone: [phone] Fax: [phone]     Social Drivers of Health (SDOH) Social History: SDOH Screenings   Food Insecurity: No Food Insecurity (08/18/2024)  Housing: Unknown (08/18/2024)  Transportation Needs: No Transportation Needs (08/18/2024)  Utilities: Not At Risk (08/18/2024)  Alcohol Screen: Low Risk  (12/20/2018)  Financial Resource Strain: Low Risk  (03/01/2024)   Received from [facility]  Physical Activity: Inactive (03/19/2021)   Received from [facility]  Social Connections: Socially Isolated (08/18/2024)  Stress: No Stress Concern Present (03/19/2021)   Received from [facility]  Tobacco Use: Low Risk  (08/18/2024)   SDOH Interventions:     Readmission Risk Interventions     No data to display

## 2024-08-19 NOTE — Care Management Obs Status (Signed)
 MEDICARE OBSERVATION STATUS NOTIFICATION   Patient Details  Name: Shelby [name] MRN: [id number] Date of Birth: [birth year]   Medicare Observation Status Notification Given:  Yes    Harlyn Italiano W, CMA 08/19/2024, 10:31 AM

## 2024-08-19 NOTE — Progress Notes (Signed)
 PROGRESS NOTE  Shelby [name]    DOB: August 17, 1941, 82 y.o.  [name]    Code Status: Full Code   DOA: 08/18/2024   LOS: 0   Brief hospital course  Shelby [name] is a 83 y.o. female with a PMH significant for HFrEF, anemia, dilated cardiomyopathy, type 2 diabetes, HTN, HLD, OSA, peripheral neuropathy, CKD 3, obesity, dementia, hyperparathyroidism, osteopenia, open-angle glaucoma, thrombocytopenia, restless leg syndrome, history of seizure-like activity, urinary incontinence. At baseline, they live with family and are mostly dependent for their ADLs.  At baseline she is ambulatory with a walker however, approximately 3 weeks ago she had an ankle fracture and has mostly been immobile since that point which has led to skin breakdown on her hips and sacrum.   They presented from home to the ED on 08/18/2024 with weakness x several days.  She states that since she had her ankle fracture 3 weeks ago, she has been mostly immobile and has had progressively worsening weakness over the last few days.  Denies any specific location of the weakness.  She just overall feels tired.  Does endorse urinary incontinence.  Denies dysuria.  Does not endorse any respiratory symptoms.  She has pain of her right ankle which is being managed conservatively and was last seen by podiatry outpatient on 9/5.  She they performed a lower extremity Doppler which was negative for DVT at that time.  Follows with Dr. Lazarus for nephrology outpatient.   In the ED, it was found that they had stable vital signs.  Significant findings included: Na+ 145, K+ 3.9, glucose 162, creatinine 2.52 (baseline closer to 2).  UA positive for large leukocytes, many bacteria, greater than 50 WBCs.  Cultures pending Chest x-ray: Negative for acute cardiopulmonary processes.   They were initially treated with ceftriaxone , IV fluids.    Patient was admitted to medicine service for further workup and management of UTI as outlined in  detail below.  Assessment & Plan  Principal Problem:   Urinary tract infection  UTI-as indicated on urinalysis.  Urine culture was sent and will be reviewed as updated Generalized weakness- in setting of infection and recent injury.  - Continue ceftriaxone  - PT/OT- recommending SNF - blood cultures NGTD   AKI on CKDIIIb- Cr returned back to baseline of 2.09 - avoid nephrotoxic agents - renal dosing meds   Incontinence-associated dermatitis  pressure breakdown of skin- wounds do not appear to be acutely infected - WOC consulted.    POA closed low lateral malleolus fracture, right- treated conservatively by podiatry outpatient - Apply ice and elevation - Analgesia as needed - PT   HFrEF- on carvedilol  at baseline. Holding in setting of bradycardia. Does not appear to be in acute exacerbation. Last echo in 6/11.  - holding home diuretics in AKI setting    Dementia- continue home donepezil , trazodone    HTN- continue home amlodipine , HCTZ   HLD- continue home atorvastatin     Peripheral neuropathy, Type II DM - continue home gabapentin , farxiga   Body mass index is 34.88 kg/m.  VTE ppx: heparin  injection 5,000 Units Start: 08/18/24 2200  Diet:     Diet   Diet regular Room service appropriate? Yes; Fluid consistency: Thin   Consultants: None   Subjective 08/19/24    Pt reports feeling improved today. Still has generalized tiredness.    Objective  Blood pressure (!) 106/54, pulse 67, temperature 98.7 F (37.1 C), resp. rate 18, height 5' (1.524 m), weight 81 kg, SpO2 100%.  Intake/Output  Summary (Last 24 hours) at 08/19/2024 0719 Last data filed at 08/19/2024 0509 Gross per 24 hour  Intake 297.87 ml  Output 300 ml  Net -2.13 ml   Filed Weights   08/18/24 1134  Weight: 81 kg    Physical Exam:  Constitutional: NAD, calm, comfortable HEENT: lids and conjunctivae normal. Dry mucous membranes. Hard of hearing  Respiratory: CTAB, no wheezing, no crackles. Normal  respiratory effort. No accessory muscle use.  Cardiovascular: RRR, no murmurs / rubs / gallops. Abdomen: soft, NT, ND, no masses or HSM palpated. Musculoskeletal: non-pitting edema of R ankle. Tenderness to palpation. ROM limited 2/2 pain.  Skin: shallow skin breakdown on sacrum and R hip with healthy granulation tissue present.  Neurologic: Alert and oriented x 3. Low-volume speech. Grossly non-focal exam. PERRL Psychiatric: Normal mood. Congruent affect.  Labs   I have personally reviewed the following labs and imaging studies CBC    Component Value Date/Time   WBC 8.4 08/19/2024 0529   RBC 3.49 (L) 08/19/2024 0529   HGB 10.8 (L) 08/19/2024 0529   HCT 34.1 (L) 08/19/2024 0529   PLT 156 08/19/2024 0529   MCV 97.7 08/19/2024 0529   MCH 30.9 08/19/2024 0529   MCHC 31.7 08/19/2024 0529   RDW 14.8 08/19/2024 0529   LYMPHSABS 2.0 07/21/2024 1050   MONOABS 0.6 07/21/2024 1050   EOSABS 0.1 07/21/2024 1050   BASOSABS 0.0 07/21/2024 1050      Latest Ref Rng & Units 08/18/2024   11:37 AM 08/08/2024   10:24 AM 07/21/2024   10:50 AM  BMP  Glucose 70 - 99 mg/dL 837  98  96   BUN 8 - 23 mg/dL 74  34  35   Creatinine 0.44 - 1.00 mg/dL 7.47  7.87  7.70   Sodium 135 - 145 mmol/L 145  140  140   Potassium 3.5 - 5.1 mmol/L 3.9  3.4  3.4   Chloride 98 - 111 mmol/L 99  96  98   CO2 22 - 32 mmol/L 34  32  31   Calcium  8.9 - 10.3 mg/dL 88.5  88.9  89.8     DG Chest 2 View Result Date: 08/18/2024 CLINICAL DATA:  Weakness. EXAM: CHEST - 2 VIEW COMPARISON:  Radiographs 07/21/2024 and 05/17/2024. Cardiac CT 08/24/2023. FINDINGS: 1151 hours. Mild patient rotation to the right. The heart size and mediastinal contours are stable with aortic atherosclerosis. Chronic elevation of the right hemidiaphragm with associated chronic bibasilar atelectasis or scarring. There is no edema, confluent airspace disease, pleural effusion or pneumothorax. Old fracture of the left 6th rib noted. No acute osseous findings  are seen. IMPRESSION: No evidence of acute cardiopulmonary process. Chronic elevation of the right hemidiaphragm with associated bibasilar atelectasis or scarring. Electronically Signed   By: Elsie Perone M.D.   On: 08/18/2024 12:01   Disposition Plan & Communication  Patient status: Observation  Admitted From: Home Planned disposition location: Skilled nursing facility Anticipated discharge date: 9/14 pending dispo  Family Communication: none at bedside    Author: Marien LITTIE Piety, DO [facility] 08/19/2024, 7:19 AM   Available by Epic secure chat 7AM-7PM. If 7PM-7AM, please contact night-coverage.  [facility] contact information found on [email or web address].

## 2024-08-20 DIAGNOSIS — N3001 Acute cystitis with hematuria: Secondary | ICD-10-CM | POA: Diagnosis not present

## 2024-08-20 NOTE — Progress Notes (Signed)
 PROGRESS NOTE  Shelby [name]    DOB: Apr 13, 1941, 83 y.o.  [name]    Code Status: Full Code   DOA: 08/18/2024   LOS: 0   Brief hospital course  Shelby [name] is a 83 y.o. female with a PMH significant for HFrEF, anemia, dilated cardiomyopathy, type 2 diabetes, HTN, HLD, OSA, peripheral neuropathy, CKD 3, obesity, dementia, hyperparathyroidism, osteopenia, open-angle glaucoma, thrombocytopenia, restless leg syndrome, history of seizure-like activity, urinary incontinence. At baseline, they live with family and are mostly dependent for their ADLs.  At baseline she is ambulatory with a walker however, approximately 3 weeks ago she had an ankle fracture and has mostly been immobile since that point which has led to skin breakdown on her hips and sacrum.   They presented from home to the ED on 08/18/2024 with weakness x several days.  In the ED, it was found that they had stable vital signs.  Significant findings included: Na+ 145, K+ 3.9, glucose 162, creatinine 2.52 (baseline closer to 2).  UA positive for large leukocytes, many bacteria, greater than 50 WBCs.  Chest x-ray: Negative for acute cardiopulmonary processes.   They were initially treated with ceftriaxone , IV fluids.    Patient was admitted to medicine service for further workup and management of UTI as outlined in detail below. Currently- patient is stable and workup to go to SNF at dc is pending. UxCx showing GNR. On CTX- will adjust as finalized culture data returns.  Assessment & Plan  Principal Problem:   Urinary tract infection  UTI-as indicated on urinalysis.  Urine culture was sent and will be reviewed as updated Generalized weakness- in setting of infection and recent injury.  - Continue ceftriaxone  - PT/OT- recommending SNF - blood cultures NGTD   AKI on CKDIIIb- Cr returned back to baseline of 2.09 - avoid nephrotoxic agents - renal dosing meds   Incontinence-associated dermatitis   pressure breakdown of skin- wounds do not appear to be acutely infected - WOC consulted.    POA closed low lateral malleolus fracture, right- treated conservatively by podiatry outpatient - Apply ice and elevation - Analgesia as needed - PT   HFrEF- on carvedilol  at baseline. Holding in setting of bradycardia. Does not appear to be in acute exacerbation. Last echo in 6/11.  - holding home diuretics in AKI setting    Dementia- continue home donepezil , trazodone    HTN- continue home amlodipine , HCTZ   HLD- continue home atorvastatin     Peripheral neuropathy, Type II DM - continue home gabapentin , farxiga   Body mass index is 34.88 kg/m.  VTE ppx: heparin  injection 5,000 Units Start: 08/18/24 2200  Diet:     Diet   Diet regular Room service appropriate? Yes; Fluid consistency: Thin   Consultants: None   Subjective 08/20/24    Pt reports feeling fine today. No concerns.    Objective  Blood pressure (!) 106/54, pulse 67, temperature 98.7 F (37.1 C), resp. rate 18, height 5' (1.524 m), weight 81 kg, SpO2 100%.  Intake/Output Summary (Last 24 hours) at 08/20/2024 0659 Last data filed at 08/19/2024 0900 Gross per 24 hour  Intake 120 ml  Output --  Net 120 ml   Filed Weights   08/18/24 1134  Weight: 81 kg    Physical Exam:  Constitutional: NAD, calm, comfortable HEENT: lids and conjunctivae normal. Dry mucous membranes. Hard of hearing  Respiratory: Normal respiratory effort. No accessory muscle use.  Cardiovascular: RRR, no murmurs / rubs / gallops. Musculoskeletal: non-pitting edema of  R ankle. Tenderness to palpation. ROM limited 2/2 pain.  Skin: skin ulcers with clean bandages in place Neurologic: Alert and oriented x 3. Low-volume speech. Grossly non-focal exam. PERRL Psychiatric: Normal mood. Congruent affect.  Labs   I have personally reviewed the following labs and imaging studies CBC    Component Value Date/Time   WBC 8.4 08/19/2024 0529   RBC 3.49  (L) 08/19/2024 0529   HGB 10.8 (L) 08/19/2024 0529   HCT 34.1 (L) 08/19/2024 0529   PLT 156 08/19/2024 0529   MCV 97.7 08/19/2024 0529   MCH 30.9 08/19/2024 0529   MCHC 31.7 08/19/2024 0529   RDW 14.8 08/19/2024 0529   LYMPHSABS 2.0 07/21/2024 1050   MONOABS 0.6 07/21/2024 1050   EOSABS 0.1 07/21/2024 1050   BASOSABS 0.0 07/21/2024 1050      Latest Ref Rng & Units 08/19/2024    5:29 AM 08/18/2024   11:37 AM 08/08/2024   10:24 AM  BMP  Glucose 70 - 99 mg/dL 99  837  98   BUN 8 - 23 mg/dL 67  74  34   Creatinine 0.44 - 1.00 mg/dL 7.90  7.47  7.87   Sodium 135 - 145 mmol/L 148  145  140   Potassium 3.5 - 5.1 mmol/L 3.6  3.9  3.4   Chloride 98 - 111 mmol/L 106  99  96   CO2 22 - 32 mmol/L 34  34  32   Calcium  8.9 - 10.3 mg/dL 89.4  88.5  88.9     DG Chest 2 View Result Date: 08/18/2024 CLINICAL DATA:  Weakness. EXAM: CHEST - 2 VIEW COMPARISON:  Radiographs 07/21/2024 and 05/17/2024. Cardiac CT 08/24/2023. FINDINGS: 1151 hours. Mild patient rotation to the right. The heart size and mediastinal contours are stable with aortic atherosclerosis. Chronic elevation of the right hemidiaphragm with associated chronic bibasilar atelectasis or scarring. There is no edema, confluent airspace disease, pleural effusion or pneumothorax. Old fracture of the left 6th rib noted. No acute osseous findings are seen. IMPRESSION: No evidence of acute cardiopulmonary process. Chronic elevation of the right hemidiaphragm with associated bibasilar atelectasis or scarring. Electronically Signed   By: Elsie Perone M.D.   On: 08/18/2024 12:01   Disposition Plan & Communication  Patient status: Observation  Admitted From: Home Planned disposition location: Skilled nursing facility Anticipated discharge date: 9/14 pending dispo  Family Communication: none at bedside    Author: Marien LITTIE Piety, DO [facility] 08/20/2024, 6:59 AM   Available by Epic secure chat 7AM-7PM. If 7PM-7AM, please contact  night-coverage.  [facility] contact information found on [email or web address].

## 2024-08-20 NOTE — Plan of Care (Signed)

## 2024-08-20 NOTE — Plan of Care (Signed)
  Problem: Education: Goal: Knowledge of General Education information will improve Description: Including pain rating scale, medication(s)/side effects and non-pharmacologic comfort measures Outcome: Progressing   Problem: Health Behavior/Discharge Planning: Goal: Ability to manage health-related needs will improve Outcome: Progressing   Problem: Clinical Measurements: Goal: Ability to maintain clinical measurements within normal limits will improve Outcome: Progressing Goal: Will remain free from infection Outcome: Progressing Goal: Diagnostic test results will improve Outcome: Progressing Goal: Respiratory complications will improve Outcome: Progressing Goal: Cardiovascular complication will be avoided Outcome: Progressing   Problem: Nutrition: Goal: Adequate nutrition will be maintained Outcome: Progressing   Problem: Coping: Goal: Level of anxiety will decrease Outcome: Progressing   Problem: Pain Managment: Goal: General experience of comfort will improve and/or be controlled Outcome: Progressing   Problem: Safety: Goal: Ability to remain free from injury will improve Outcome: Progressing

## 2024-08-21 DIAGNOSIS — N3001 Acute cystitis with hematuria: Secondary | ICD-10-CM | POA: Diagnosis not present

## 2024-08-21 LAB — CBC
HCT: 34.9 % — ABNORMAL LOW (ref 36.0–46.0)
Hemoglobin: 11.2 g/dL — ABNORMAL LOW (ref 12.0–15.0)
MCH: 31.1 pg (ref 26.0–34.0)
MCHC: 32.1 g/dL (ref 30.0–36.0)
MCV: 96.9 fL (ref 80.0–100.0)
Platelets: 224 K/uL (ref 150–400)
RBC: 3.6 MIL/uL — ABNORMAL LOW (ref 3.87–5.11)
RDW: 14.8 % (ref 11.5–15.5)
WBC: 6.5 K/uL (ref 4.0–10.5)
nRBC: 0 % (ref 0.0–0.2)

## 2024-08-21 LAB — BASIC METABOLIC PANEL WITH GFR
Anion gap: 8 (ref 5–15)
BUN: 35 mg/dL — ABNORMAL HIGH (ref 8–23)
CO2: 30 mmol/L (ref 22–32)
Calcium: 11 mg/dL — ABNORMAL HIGH (ref 8.9–10.3)
Chloride: 104 mmol/L (ref 98–111)
Creatinine, Ser: 1.46 mg/dL — ABNORMAL HIGH (ref 0.44–1.00)
GFR, Estimated: 36 mL/min — ABNORMAL LOW (ref >60)
Glucose, Bld: 109 mg/dL — ABNORMAL HIGH (ref 70–99)
Potassium: 3.9 mmol/L (ref 3.5–5.1)
Sodium: 142 mmol/L (ref 135–145)

## 2024-08-21 LAB — URINE CULTURE: Culture: >=100000 — AB

## 2024-08-21 MED ORDER — FUROSEMIDE 20 MG PO TABS
20.0000 mg | ORAL_TABLET | Freq: Every day | ORAL | Status: DC
  Administered 2024-08-21 – 2024-08-25 (×5): 20 mg via ORAL
  Filled 2024-08-21 (×5): qty 1

## 2024-08-21 MED ORDER — CEFADROXIL 500 MG PO CAPS
500.0000 mg | ORAL_CAPSULE | Freq: Two times a day (BID) | ORAL | Status: AC
  Administered 2024-08-21 – 2024-08-23 (×6): 500 mg via ORAL
  Filled 2024-08-21 (×6): qty 1

## 2024-08-21 MED ORDER — VITAMIN C 500 MG PO TABS
500.0000 mg | ORAL_TABLET | Freq: Every day | ORAL | Status: DC
Start: 2024-08-21 — End: 2024-08-25
  Administered 2024-08-21 – 2024-08-25 (×5): 500 mg via ORAL
  Filled 2024-08-21 (×5): qty 1

## 2024-08-21 MED ORDER — CEFADROXIL 500 MG PO CAPS: 1000.0000 mg | ORAL_CAPSULE | Freq: Two times a day (BID) | ORAL | Status: DC

## 2024-08-21 NOTE — Plan of Care (Signed)
  Problem: Education: Goal: Knowledge of General Education information will improve Description: Including pain rating scale, medication(s)/side effects and non-pharmacologic comfort measures Outcome: Progressing   Problem: Health Behavior/Discharge Planning: Goal: Ability to manage health-related needs will improve Outcome: Progressing   Problem: Clinical Measurements: Goal: Ability to maintain clinical measurements within normal limits will improve Outcome: Progressing Goal: Will remain free from infection Outcome: Progressing Goal: Diagnostic test results will improve Outcome: Progressing Goal: Respiratory complications will improve Outcome: Progressing Goal: Cardiovascular complication will be avoided Outcome: Progressing   Problem: Elimination: Goal: Will not experience complications related to bowel motility Outcome: Progressing Goal: Will not experience complications related to urinary retention Outcome: Progressing   Problem: Safety: Goal: Ability to remain free from injury will improve Outcome: Progressing   Problem: Skin Integrity: Goal: Risk for impaired skin integrity will decrease Outcome: Progressing

## 2024-08-21 NOTE — Progress Notes (Signed)
 PHARMACY NOTE:  ANTIMICROBIAL RENAL DOSAGE ADJUSTMENT  Current antimicrobial regimen includes a mismatch between antimicrobial dosage and estimated renal function.  As per policy approved by the Pharmacy & Therapeutics and Medical Executive Committees, the antimicrobial dosage will be adjusted accordingly.  Current antimicrobial dosage:  Cefadroxil  1g BID   Renal Function:  Estimated Creatinine Clearance: 28.5 mL/min (A) (by C-G formula based on SCr of 1.46 mg/dL (H)).     Antimicrobial dosage has been changed to:  Cefadroxil  500mg  BID  Additional comments:   Thank you for allowing pharmacy to be a part of this patient's care.  Estill CHRISTELLA Lutes, PharmD, BCPS Clinical Pharmacist 08/21/2024 10:57 AM

## 2024-08-21 NOTE — TOC CM/SW Note (Signed)
..  Transition of Care [facility]) - Inpatient Brief Assessment   Patient Details  Name: Shelby [name] MRN: [id number] Date of Birth: November 23, 1941  [facility] CM/SW Contact:    Shelby DELENA Fischer, LCSW Phone Number: 08/21/2024, 10:56 AM   Clinical Narrative  FL2 completed.  TOC submitted FL2 for rehab placement.  Waiting on response  Transition of Care Asessment: Insurance and Status: Insurance coverage has been reviewed Patient has primary care physician: Yes Home environment has been reviewed: Yes, spoke with Son, Shelby [name] Prior level of function:: Unable to do ADL's Prior/Current Home Services: No current home services Social Drivers of Health Review: SDOH reviewed needs interventions Readmission risk has been reviewed: Yes Transition of care needs: transition of care needs identified, TOC will continue to follow

## 2024-08-21 NOTE — Progress Notes (Signed)
 PROGRESS NOTE Shelby [name]    DOB: [birth year], 83 y.o.  [name]    Code Status: Full Code   DOA: 08/18/2024   LOS: 0  Brief hospital course  Shelby [name] is a 83 y.o. female with a PMH significant for HFrEF, anemia, dilated cardiomyopathy, type 2 diabetes, HTN, HLD, OSA, peripheral neuropathy, CKD 3, obesity, dementia, hyperparathyroidism, osteopenia, open-angle glaucoma, thrombocytopenia, restless leg syndrome, history of seizure-like activity, urinary incontinence. At baseline, they live with family and are mostly dependent for their ADLs.  At baseline she is ambulatory with a walker however, approximately 3 weeks ago she had an ankle fracture and has mostly been immobile since that point which has led to skin breakdown on her hips and sacrum.   They presented from home to the ED on 08/18/2024 with weakness x several days.  In the ED, it was found that they had stable vital signs.  Significant findings included: Na+ 145, K+ 3.9, glucose 162, creatinine 2.52 (baseline closer to 2).  UA positive for large leukocytes, many bacteria, greater than 50 WBCs.  Chest x-ray: Negative for acute cardiopulmonary processes.   They were initially treated with ceftriaxone , IV fluids.    Patient was admitted to medicine service for further workup and management of UTI as outlined in detail below. Currently- patient is stable and workup to go to SNF at dc is pending. UxCx showing Klebsiella pneumoniae and E. coli (both sensitive to ceftriaxone ).  Will convert to oral treatment today  Assessment & Plan  Principal Problem:   Urinary tract infection  UTI-UxCx showing Klebsiella pneumoniae and E. coli (both sensitive to ceftriaxone ).  Will convert to oral treatment today. - blood cultures NGTD Generalized weakness- in setting of infection and recent injury.  - Ceftriaxone - converted to cefadroxil . Complete 5 day course - PT/OT- recommending SNF. TOC following   AKI on CKDIIIb- Cr  returned back to baseline. 1.46 today - avoid nephrotoxic agents - renal dosing meds   Incontinence-associated dermatitis  pressure breakdown of skin- wounds do not appear to be acutely infected - WOC consulted.    POA closed low lateral malleolus fracture, right- treated conservatively by podiatry outpatient - Apply ice and elevation - Analgesia as needed - PT   HFrEF- on carvedilol  at baseline. Holding in setting of bradycardia. Does not appear to be in acute exacerbation. Last echo on 6/11.  - restarting home diuretics   Dementia- continue home donepezil , trazodone    HTN- continue home amlodipine , HCTZ   HLD- continue home atorvastatin     Peripheral neuropathy, Type II DM - continue home gabapentin , farxiga   Body mass index is 34.88 kg/m.  VTE ppx: heparin  injection 5,000 Units Start: 08/18/24 2200  Diet:     Diet   Diet regular Room service appropriate? Yes; Fluid consistency: Thin   Consultants: None   Subjective 08/21/24    Pt reports feeling fine today. No concerns.    Objective  Blood pressure (!) 106/54, pulse 67, temperature 98.7 F (37.1 C), resp. rate 18, height 5' (1.524 m), weight 81 kg, SpO2 100%.  Intake/Output Summary (Last 24 hours) at 08/21/2024 0713 Last data filed at 08/20/2024 1300 Gross per 24 hour  Intake 600 ml  Output --  Net 600 ml   Filed Weights   08/18/24 1134  Weight: 81 kg    Physical Exam:  Constitutional: NAD, calm, comfortable HEENT: lids and conjunctivae normal. Dry mucous membranes. Hard of hearing  Respiratory: Normal respiratory effort. No accessory muscle use.  Cardiovascular:  RRR, no murmurs / rubs / gallops. Musculoskeletal: non-pitting edema of R ankle. Tenderness to palpation. ROM limited 2/2 pain.  Skin: skin ulcers with clean bandages in place Neurologic: Alert and oriented x 3. Low-volume speech. Grossly non-focal exam. PERRL Psychiatric: Normal mood. Congruent affect.  Labs   I have personally reviewed  the following labs and imaging studies CBC    Component Value Date/Time   WBC 8.4 08/19/2024 0529   RBC 3.49 (L) 08/19/2024 0529   HGB 10.8 (L) 08/19/2024 0529   HCT 34.1 (L) 08/19/2024 0529   PLT 156 08/19/2024 0529   MCV 97.7 08/19/2024 0529   MCH 30.9 08/19/2024 0529   MCHC 31.7 08/19/2024 0529   RDW 14.8 08/19/2024 0529   LYMPHSABS 2.0 07/21/2024 1050   MONOABS 0.6 07/21/2024 1050   EOSABS 0.1 07/21/2024 1050   BASOSABS 0.0 07/21/2024 1050      Latest Ref Rng & Units 08/19/2024    5:29 AM 08/18/2024   11:37 AM 08/08/2024   10:24 AM  BMP  Glucose 70 - 99 mg/dL 99  837  98   BUN 8 - 23 mg/dL 67  74  34   Creatinine 0.44 - 1.00 mg/dL 7.90  7.47  7.87   Sodium 135 - 145 mmol/L 148  145  140   Potassium 3.5 - 5.1 mmol/L 3.6  3.9  3.4   Chloride 98 - 111 mmol/L 106  99  96   CO2 22 - 32 mmol/L 34  34  32   Calcium  8.9 - 10.3 mg/dL 89.4  88.5  88.9    No results found.  Disposition Plan & Communication  Patient status: Observation  Admitted From: Home Planned disposition location: Skilled nursing facility Anticipated discharge date: 9/15 pending dispo  Family Communication: son on phone   Author: Marien LITTIE Piety, DO [facility] 08/21/2024, 7:13 AM   Available by Epic secure chat 7AM-7PM. If 7PM-7AM, please contact night-coverage.  TR[facility] contact information found on [email or web address].

## 2024-08-21 NOTE — NC FL2 (Signed)
 [place]  MEDICAID FL2 LEVEL OF CARE FORM     IDENTIFICATION  Patient Name: Shelby [name] Birthdate: June 25, 1941 Sex: female Admission Date (Current Location): 08/18/2024  [place] and [place] Number:  Chiropodist and Address:  [facility], [address], [place], [place] [address]      Provider Number: [id number]  Attending Physician Name and Address:  Lenon Marien CROME, MD  Relative Name and Phone Number:  ATHALEE, ESTERLINE (Son)  [phone] [facility])    Current Level of Care: Hospital Recommended Level of Care: Skilled Nursing Facility Prior Approval Number:    Date Approved/Denied:   PASRR Number: [id number]  Discharge Plan: SNF    Current Diagnoses: Patient Active Problem List   Diagnosis Date Noted   Urinary tract infection 08/18/2024   Problem related to unspecified psychosocial circumstances 07/21/2024   Seizure-like activity (HCC) 03/01/2024   Hyperparathyroidism, primary (HCC) 08/04/2023   Chronic idiopathic constipation 07/21/2023   Generalized anxiety disorder 07/21/2023   Syncope 07/01/2023   Type II diabetes mellitus with renal manifestations (HCC) 07/01/2023   HLD (hyperlipidemia) 07/01/2023   Acute renal failure superimposed on stage 3b chronic kidney disease (HCC) 07/01/2023   OAB (overactive bladder) 03/30/2023   Weakness 02/05/2023   Hypoxemia 02/01/2023   AKI (acute kidney injury) (HCC) 02/01/2023   Elevated d-dimer 02/01/2023   SOB (shortness of breath) 01/31/2023   Chronic diastolic CHF (congestive heart failure) (HCC) 01/31/2023   Hypercalcemia 01/31/2023   Obesity (BMI 30-39.9) 01/31/2023   Pancreatic cyst 05/22/2022   Primary open angle glaucoma (POAG) of left eye, indeterminate stage 05/22/2022   Skin lesion 05/22/2022   Osteopenia of multiple sites 02/17/2022   Non-seasonal allergic rhinitis 11/18/2021   Tremor of unknown origin 11/18/2021   Restless leg syndrome 06/28/2021   Lumbago  with sciatica, right side 06/24/2020   Calculus of gallbladder without cholecystitis without obstruction 12/09/2019   Hypertensive heart disease with heart failure (HCC) 12/09/2019   Other chronic pain 12/09/2019   Type 2 diabetes mellitus with diabetic polyneuropathy (HCC) 12/09/2019   Lymphedema 09/03/2018   Multinodular goiter 07/31/2018   Aortic atherosclerosis (HCC) 07/27/2018   Chronic renal insufficiency, stage 3 (moderate) (HCC) 04/19/2018   Pulmonary nodule 04/18/2018   Chronic systolic heart failure (HCC) 03/08/2018   History of nephrolithiasis 01/23/2018   Hypomagnesemia 01/19/2018   Person encountering health services to consult on behalf of another person 01/14/2018   Thrombocytopenia (HCC) 11/03/2017   Unspecified dementia, unspecified severity, without behavioral disturbance, psychotic disturbance, mood disturbance, and anxiety (HCC) 07/30/2017   Urinary incontinence in female 02/23/2017   High risk medication use 12/11/2015   Left lumbar radiculopathy 12/05/2014   Anemia, unspecified 06/12/2014   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   Type 2 diabetes w unsp diabetic rtnop w/o macular edema (HCC) 06/12/2014   Benign essential hypertension 06/12/2014   Hyperlipidemia 06/12/2014   Obstructive sleep apnea 06/12/2014   Peripheral neuropathy 06/12/2014   Proteinuria 06/12/2014   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HCC) 06/12/2014   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) 06/12/2014   Vitamin B 12 deficiency 06/12/2014   Vitamin D deficiency 06/12/2014   Hypertension associated with type 2 diabetes mellitus (HCC) 06/12/2014   Iron deficiency anemia due to chronic blood loss 06/12/2014    Orientation RESPIRATION BLADDER Height & Weight     Self, Place  Normal Incontinent Weight: 178 lb 9.2 oz (81 kg) Height:  5' (152.4 cm)  BEHAVIORAL SYMPTOMS/MOOD NEUROLOGICAL BOWEL  NUTRITION STATUS      Incontinent Diet  AMBULATORY  STATUS COMMUNICATION OF NEEDS Skin   Extensive Assist Verbally Surgical wounds (Wound type: 1.  Stage 2 Pressure Injury sacrum and B buttocks pink moist   2.  Partial thickness skin loss underneath both breasts pink)                       Personal Care Assistance Level of Assistance  Total care       Total Care Assistance: Maximum assistance   Functional Limitations Info  Sight, Hearing, Speech Sight Info: Adequate Hearing Info: Impaired Speech Info: Adequate    SPECIAL CARE FACTORS FREQUENCY  PT (By licensed PT), OT (By licensed OT)     PT Frequency: 5x a week OT Frequency: 3x a week            Contractures Contractures Info: Not present    Additional Factors Info  Code Status, Allergies Code Status Info: FULL Allergies Info: Furosemide            Current Medications (08/21/2024):  This is the current hospital active medication list Current Facility-Administered Medications  Medication Dose Route Frequency Provider Last Rate Last Admin   acetaminophen  (TYLENOL ) tablet 650 mg  650 mg Oral Q6H PRN Lenon Marien CROME, MD       Or   acetaminophen  (TYLENOL ) suppository 650 mg  650 mg Rectal Q6H PRN Lenon Marien CROME, MD       albuterol  (PROVENTIL ) (2.5 MG/3ML) 0.083% nebulizer solution 2.5 mg  2.5 mg Nebulization Q2H PRN Lenon Marien CROME, MD       amLODipine  (NORVASC ) tablet 10 mg  10 mg Oral Daily Lenon Marien CROME, MD   10 mg at 08/21/24 0920   aspirin  EC tablet 81 mg  81 mg Oral Daily Lenon Marien CROME, MD   81 mg at 08/21/24 0920   atorvastatin  (LIPITOR) tablet 80 mg  80 mg Oral Daily Lenon Marien CROME, MD   80 mg at 08/21/24 0920   bisacodyl  (DULCOLAX) EC tablet 5 mg  5 mg Oral Daily PRN Lenon Marien CROME, MD       cefadroxil  (DURICEF) capsule 1,000 mg  1,000 mg Oral BID Lenon Marien CROME, MD       dapagliflozin  propanediol (FARXIGA ) tablet 10 mg  10 mg Oral Daily Lenon Marien L, MD   10 mg at 08/21/24 0920   donepezil  (ARICEPT ) tablet 10  mg  10 mg Oral QHS Lenon Marien CROME, MD   10 mg at 08/20/24 2031   gabapentin  (NEURONTIN ) capsule 300 mg  300 mg Oral TID Lenon Marien CROME, MD   300 mg at 08/21/24 0920   heparin  injection 5,000 Units  5,000 Units Subcutaneous Q8H Lenon Marien CROME, MD   5,000 Units at 08/21/24 0515   hydrochlorothiazide  (HYDRODIURIL ) tablet 12.5 mg  12.5 mg Oral Daily Lenon Marien L, MD   12.5 mg at 08/21/24 0920   nystatin  (MYCOSTATIN /NYSTOP ) topical powder   Topical PRN Lenon Marien CROME, MD   Given at 08/20/24 0400   ondansetron  (ZOFRAN ) tablet 4 mg  4 mg Oral Q6H PRN Lenon Marien CROME, MD       Or   ondansetron  (ZOFRAN ) injection 4 mg  4 mg Intravenous Q6H PRN Lenon Marien CROME, MD       oxyCODONE  (Oxy IR/ROXICODONE ) immediate release tablet 5 mg  5 mg Oral Q6H PRN Anderson, Chelsey L, MD   5 mg at 08/20/24 1725   polyethylene  glycol (MIRALAX  / GLYCOLAX ) packet 17 g  17 g Oral Daily Lenon Marien CROME, MD   17 g at 08/21/24 0920   traZODone  (DESYREL ) tablet 50 mg  50 mg Oral QHS Anderson, Chelsey L, MD   50 mg at 08/20/24 2031     Discharge Medications: Please see discharge summary for a list of discharge medications.  Relevant Imaging Results:  Relevant Lab Results:   Additional Information SS#  [id number]  DOB: 03-26-1941  Edsel DELENA Fischer, LCSW

## 2024-08-21 NOTE — Plan of Care (Signed)
  Problem: Education: Goal: Knowledge of General Education information will improve Description: Including pain rating scale, medication(s)/side effects and non-pharmacologic comfort measures Outcome: Progressing   Problem: Clinical Measurements: Goal: Ability to maintain clinical measurements within normal limits will improve Outcome: Progressing Goal: Will remain free from infection Outcome: Progressing Goal: Diagnostic test results will improve Outcome: Progressing Goal: Respiratory complications will improve Outcome: Progressing Goal: Cardiovascular complication will be avoided Outcome: Progressing   Problem: Nutrition: Goal: Adequate nutrition will be maintained Outcome: Progressing   Problem: Coping: Goal: Level of anxiety will decrease Outcome: Progressing   Problem: Safety: Goal: Ability to remain free from injury will improve Outcome: Progressing   Problem: Skin Integrity: Goal: Risk for impaired skin integrity will decrease Outcome: Progressing

## 2024-08-22 DIAGNOSIS — N3001 Acute cystitis with hematuria: Secondary | ICD-10-CM | POA: Diagnosis not present

## 2024-08-22 NOTE — Plan of Care (Signed)
 The patient is alert and confused. Incontinent of bowel and bladder. Bed bound. Turned and repositioned every 2 hours. No s/s of acute distress noted.

## 2024-08-22 NOTE — Progress Notes (Signed)
 PROGRESS NOTE Shelby [name]    DOB: 1941-05-30, 83 y.o.  [name]    Code Status: Full Code   DOA: 08/18/2024   LOS: 0  Brief hospital course  Shelby [name] is a 83 y.o. female with a PMH significant for HFrEF, anemia, dilated cardiomyopathy, type 2 diabetes, HTN, HLD, OSA, peripheral neuropathy, CKD 3, obesity, dementia, hyperparathyroidism, osteopenia, open-angle glaucoma, thrombocytopenia, restless leg syndrome, history of seizure-like activity, urinary incontinence. At baseline, they live with family and are mostly dependent for their ADLs.  At baseline she is ambulatory with a walker however, approximately 3 weeks ago she had an ankle fracture and has mostly been immobile since that point which has led to skin breakdown on her hips and sacrum.   They presented from home to the ED on 08/18/2024 with weakness x several days.  In the ED, it was found that they had stable vital signs.  Significant findings included: Na+ 145, K+ 3.9, glucose 162, creatinine 2.52 (baseline closer to 2).  UA positive for large leukocytes, many bacteria, greater than 50 WBCs.  Chest x-ray: Negative for acute cardiopulmonary processes.   They were initially treated with ceftriaxone , IV fluids.    Currently- patient is medically stable to dc to SNF when able. UxCx showing Klebsiella pneumoniae and E. coli (both sensitive to ceftriaxone ).  Continue oral therapy.   Assessment & Plan  Principal Problem:   Urinary tract infection  UTI-UxCx showing Klebsiella pneumoniae and E. coli (both sensitive to ceftriaxone ).   - blood cultures NGTD Generalized weakness- in setting of infection and recent injury.  - Ceftriaxone - converted to cefadroxil . Complete 5 day course - PT/OT- recommending SNF. TOC following   AKI on CKDIIIb- Cr returned back to baseline. 1.46 today - avoid nephrotoxic agents - renal dosing meds   Incontinence-associated dermatitis  pressure breakdown of skin- wounds do not  appear to be acutely infected - WOC consulted.    POA closed low lateral malleolus fracture, right- treated conservatively by podiatry outpatient - Apply ice and elevation - Analgesia as needed - PT   HFrEF- on carvedilol  at baseline. Holding in setting of bradycardia. Does not appear to be in acute exacerbation. Last echo on 6/11.  - restarting home diuretics   Dementia- continue home donepezil , trazodone    HTN- continue home amlodipine , HCTZ   HLD- continue home atorvastatin     Peripheral neuropathy, Type II DM - continue home gabapentin , farxiga   Body mass index is 34.88 kg/m.  VTE ppx: heparin  injection 5,000 Units Start: 08/18/24 2200  Diet:     Diet   Diet regular Room service appropriate? Yes; Fluid consistency: Thin   Consultants: None   Subjective 08/22/24    Pt reports feeling fine today. No concerns.    Objective  Blood pressure (!) 106/54, pulse 67, temperature 98.7 F (37.1 C), resp. rate 18, height 5' (1.524 m), weight 81 kg, SpO2 100%. No intake or output data in the 24 hours ending 08/22/24 0717  Filed Weights   08/18/24 1134  Weight: 81 kg    Physical Exam:  Constitutional: NAD, calm, comfortable HEENT: lids and conjunctivae normal. Dry mucous membranes. Hard of hearing  Respiratory: Normal respiratory effort. No accessory muscle use.  Cardiovascular: RRR, no murmurs / rubs / gallops. Musculoskeletal: non-pitting edema of R ankle. Tenderness to palpation. ROM limited 2/2 pain.  Skin: skin ulcers with clean bandages in place Neurologic: Alert and oriented x 3. Low-volume speech. Grossly non-focal exam. PERRL Psychiatric: Normal mood. Congruent affect.  Labs   I have personally reviewed the following labs and imaging studies CBC    Component Value Date/Time   WBC 6.5 08/21/2024 0815   RBC 3.60 (L) 08/21/2024 0815   HGB 11.2 (L) 08/21/2024 0815   HCT 34.9 (L) 08/21/2024 0815   PLT 224 08/21/2024 0815   MCV 96.9 08/21/2024 0815   MCH  31.1 08/21/2024 0815   MCHC 32.1 08/21/2024 0815   RDW 14.8 08/21/2024 0815   LYMPHSABS 2.0 07/21/2024 1050   MONOABS 0.6 07/21/2024 1050   EOSABS 0.1 07/21/2024 1050   BASOSABS 0.0 07/21/2024 1050      Latest Ref Rng & Units 08/21/2024    8:15 AM 08/19/2024    5:29 AM 08/18/2024   11:37 AM  BMP  Glucose 70 - 99 mg/dL 890  99  837   BUN 8 - 23 mg/dL 35  67  74   Creatinine 0.44 - 1.00 mg/dL 8.53  7.90  7.47   Sodium 135 - 145 mmol/L 142  148  145   Potassium 3.5 - 5.1 mmol/L 3.9  3.6  3.9   Chloride 98 - 111 mmol/L 104  106  99   CO2 22 - 32 mmol/L 30  34  34   Calcium  8.9 - 10.3 mg/dL 88.9  89.4  88.5    No results found.  Disposition Plan & Communication  Patient status: Observation  Admitted From: Home Planned disposition location: Skilled nursing facility Anticipated discharge date: 9/16 pending dispo  Family Communication: son on phone   Author: Marien LITTIE Piety, DO [facility] 08/22/2024, 7:17 AM   Available by Epic secure chat 7AM-7PM. If 7PM-7AM, please contact night-coverage.  TR[facility] contact information found on [email or web address].

## 2024-08-22 NOTE — Plan of Care (Signed)

## 2024-08-22 NOTE — TOC Progression Note (Signed)
 Transition of Care [facility]) - Progression Note    Patient Details  Name: Shelby [name] MRN: [id number] Date of Birth: July 24, 1941  [facility]) CM/SW Contact  Alfonso Rummer, LCSW Phone Number: 08/22/2024, 2:27 PM  Clinical Narrative:     KEN DELENA Rummer met with pt regarding snf. Pt is in agreement to transition to [employer] authorization started.                    Expected Discharge Plan and Services                                               Social Drivers of Health (SDOH) Interventions SDOH Screenings   Food Insecurity: No Food Insecurity (08/18/2024)  Housing: Unknown (08/18/2024)  Transportation Needs: No Transportation Needs (08/18/2024)  Utilities: Not At Risk (08/18/2024)  Alcohol Screen: Low Risk  (12/20/2018)  Financial Resource Strain: Low Risk  (03/01/2024)   Received from [facility]  Physical Activity: Inactive (03/19/2021)   Received from [facility]  Social Connections: Socially Isolated (08/18/2024)  Stress: No Stress Concern Present (03/19/2021)   Received from [facility]  Tobacco Use: Low Risk  (08/18/2024)    Readmission Risk Interventions     No data to display

## 2024-08-23 DIAGNOSIS — N3001 Acute cystitis with hematuria: Secondary | ICD-10-CM | POA: Diagnosis not present

## 2024-08-23 LAB — CULTURE, BLOOD (ROUTINE X 2)
Culture: NO GROWTH
Culture: NO GROWTH

## 2024-08-23 MED ORDER — CEFADROXIL 500 MG PO CAPS: 500.0000 mg | ORAL_CAPSULE | Freq: Two times a day (BID) | ORAL | 0 refills | Status: DC

## 2024-08-23 MED ORDER — NYSTATIN 100000 UNIT/GM EX POWD: CUTANEOUS | Status: AC | PRN

## 2024-08-23 MED ORDER — ACETAMINOPHEN 325 MG PO TABS: 650.0000 mg | ORAL_TABLET | Freq: Four times a day (QID) | ORAL | Status: AC | PRN

## 2024-08-23 NOTE — Progress Notes (Signed)
 PROGRESS NOTE Shelby [name]    DOB: 11/04/1941, 83 y.o.  [name]    Code Status: Full Code   DOA: 08/18/2024   LOS: 0  Brief hospital course  Shelby [name] is a 83 y.o. female with a PMH significant for HFrEF, anemia, dilated cardiomyopathy, type 2 diabetes, HTN, HLD, OSA, peripheral neuropathy, CKD 3, obesity, dementia, hyperparathyroidism, osteopenia, open-angle glaucoma, thrombocytopenia, restless leg syndrome, history of seizure-like activity, urinary incontinence. At baseline, they live with family and are mostly dependent for their ADLs.  At baseline she is ambulatory with a walker however, approximately 3 weeks ago she had an ankle fracture and has mostly been immobile since that point which has led to skin breakdown on her hips and sacrum.   They presented from home to the ED on 08/18/2024 with weakness x several days.  In the ED, it was found that they had stable vital signs.  Significant findings included: Na+ 145, K+ 3.9, glucose 162, creatinine 2.52 (baseline closer to 2).  UA positive for large leukocytes, many bacteria, greater than 50 WBCs.  Chest x-ray: Negative for acute cardiopulmonary processes.   They were initially treated with ceftriaxone , IV fluids.    Currently- patient is medically stable to dc to SNF when able. UxCx showing Klebsiella pneumoniae and E. coli (both sensitive to ceftriaxone ).  Continue oral therapy.   Assessment & Plan  Principal Problem:   Urinary tract infection  UTI-UxCx showing Klebsiella pneumoniae and E. coli (both sensitive to ceftriaxone ).   - blood cultures NGTD Generalized weakness- in setting of infection and recent injury.  - Ceftriaxone - converted to cefadroxil . Complete 5 day course - PT/OT- recommending SNF. TOC following   AKI on CKDIIIb- Cr returned back to baseline. 1.46 - avoid nephrotoxic agents - renal dosing meds   Incontinence-associated dermatitis  pressure breakdown of skin- wounds do not appear  to be acutely infected - WOC consulted.    POA closed low lateral malleolus fracture, right- treated conservatively by podiatry outpatient - Apply ice and elevation - Analgesia as needed - PT   HFrEF- on carvedilol  at baseline. Holding in setting of bradycardia. Does not appear to be in acute exacerbation. Last echo on 6/11.  - restarting home diuretics   Dementia- continue home donepezil , trazodone    HTN- continue home amlodipine , HCTZ   HLD- continue home atorvastatin     Peripheral neuropathy, Type II DM - continue home gabapentin , farxiga   Body mass index is 34.88 kg/m.  VTE ppx: heparin  injection 5,000 Units Start: 08/18/24 2200  Diet:     Diet   Diet regular Room service appropriate? Yes; Fluid consistency: Thin   Consultants: None   Subjective 08/23/24    Pt reports no concerns. She is agreeable to SNF.   Objective  Blood pressure (!) 106/54, pulse 67, temperature 98.7 F (37.1 C), resp. rate 18, height 5' (1.524 m), weight 81 kg, SpO2 100%.  Intake/Output Summary (Last 24 hours) at 08/23/2024 0721 Last data filed at 08/22/2024 1407 Gross per 24 hour  Intake 60 ml  Output --  Net 60 ml    Filed Weights   08/18/24 1134  Weight: 81 kg    Physical Exam:  Constitutional: NAD, calm, comfortable HEENT: lids and conjunctivae normal. Dry mucous membranes. Hard of hearing  Respiratory: Normal respiratory effort. No accessory muscle use.  Cardiovascular: RRR, no murmurs / rubs / gallops. Musculoskeletal: non-pitting edema of R ankle. Tenderness to palpation. ROM limited 2/2 pain.  Skin: skin ulcers with clean bandages  in place Neurologic: Alert and oriented x 3. Low-volume speech. Grossly non-focal exam. PERRL Psychiatric: Normal mood. Congruent affect.  Labs   I have personally reviewed the following labs and imaging studies CBC    Component Value Date/Time   WBC 6.5 08/21/2024 0815   RBC 3.60 (L) 08/21/2024 0815   HGB 11.2 (L) 08/21/2024 0815   HCT  34.9 (L) 08/21/2024 0815   PLT 224 08/21/2024 0815   MCV 96.9 08/21/2024 0815   MCH 31.1 08/21/2024 0815   MCHC 32.1 08/21/2024 0815   RDW 14.8 08/21/2024 0815   LYMPHSABS 2.0 07/21/2024 1050   MONOABS 0.6 07/21/2024 1050   EOSABS 0.1 07/21/2024 1050   BASOSABS 0.0 07/21/2024 1050      Latest Ref Rng & Units 08/21/2024    8:15 AM 08/19/2024    5:29 AM 08/18/2024   11:37 AM  BMP  Glucose 70 - 99 mg/dL 890  99  837   BUN 8 - 23 mg/dL 35  67  74   Creatinine 0.44 - 1.00 mg/dL 8.53  7.90  7.47   Sodium 135 - 145 mmol/L 142  148  145   Potassium 3.5 - 5.1 mmol/L 3.9  3.6  3.9   Chloride 98 - 111 mmol/L 104  106  99   CO2 22 - 32 mmol/L 30  34  34   Calcium  8.9 - 10.3 mg/dL 88.9  89.4  88.5    No results found.  Disposition Plan & Communication  Patient status: Observation  Admitted From: Home Planned disposition location: Skilled nursing facility Anticipated discharge date: 9/17 pending dispo  Family Communication: son on phone   Author: Marien LITTIE Piety, DO [facility] 08/23/2024, 7:21 AM   Available by Epic secure chat 7AM-7PM. If 7PM-7AM, please contact night-coverage.  [facility] contact information found on [email or web address].

## 2024-08-23 NOTE — Plan of Care (Signed)
  Problem: Clinical Measurements: Goal: Will remain free from infection Outcome: Progressing Goal: Diagnostic test results will improve Outcome: Progressing   Problem: Activity: Goal: Risk for activity intolerance will decrease Outcome: Progressing   Problem: Nutrition: Goal: Adequate nutrition will be maintained Outcome: Progressing   Problem: Elimination: Goal: Will not experience complications related to bowel motility Outcome: Progressing Goal: Will not experience complications related to urinary retention Outcome: Progressing   Problem: Pain Managment: Goal: General experience of comfort will improve and/or be controlled Outcome: Progressing   Problem: Safety: Goal: Ability to remain free from injury will improve Outcome: Progressing   Problem: Skin Integrity: Goal: Risk for impaired skin integrity will decrease Outcome: Progressing

## 2024-08-23 NOTE — Progress Notes (Signed)
 Physical Therapy Treatment Patient Details Name: Shelby [name] MRN: [id number] DOB: 01-Mar-1941 Today's Date: 08/23/2024   History of Present Illness Pt is an 83 y/o F presenting to ED with c/o weakness. Recent ED visit on 9/1 after a fall that resulted in a L distal fibula fracture, closed nondisplaced fracture of right lateral malleolus. PMH significant for HFrEF, anemia, T2DM, HTN, HLD, OSA, peripheral neuropathy, CKD-III, dementia, glaucoma.    PT Comments  Pt was supine in bed with supportive niece at bedside. Pt is alert but only truly oriented x 2. She is able to consistently follow commands and remains cooperative throughout. Pt was able to exit L side of bed with extensive +1 assistance prior to standing EOB > RW 3 x. Mod assist of one from elevated bed height. Vcs for increased knee flexion and increased fwd wt shift with STS. Pt tolerated standing for ~ 15 sec each attempt with prolonged recovery between. Pt was unable to clear L/R LE to advance to taking steps. Overall session limited by fatigue however pt seemed to tolerate well. She required more assistance to return to supine from EOB sitting. Pt will benefit from continued skilled PT at DC to maximize independence and safety with all ADLs. Pt is far from baseline abilities.      If plan is discharge home, recommend the following: A lot of help with walking and/or transfers;A lot of help with bathing/dressing/bathroom;Assistance with cooking/housework;Assistance with feeding;Direct supervision/assist for medications management;Direct supervision/assist for financial management;Assist for transportation;Help with stairs or ramp for entrance;Supervision due to cognitive status     Equipment Recommendations  Other (comment) (Defer to next level of care)       Precautions / Restrictions Precautions Precautions: Fall Recall of Precautions/Restrictions: Intact Precaution/Restrictions Comments: per recent podiatry note, pt is  instructed to wear a boot on L foot for mobility, not present in room at eval ( pt refused wearing a boot this session) I feel better without it Restrictions Weight Bearing Restrictions Per Provider Order: No     Mobility  Bed Mobility Overal bed mobility: Needs Assistance Bed Mobility: Supine to Sit, Sit to Supine  Supine to sit: Mod assist, HOB elevated, Used rails Sit to supine: Max assist, Used rails   Transfers Overall transfer level: Needs assistance Equipment used: Rolling walker (2 wheels) Transfers: Sit to/from Stand Sit to Stand: Mod assist, From elevated surface  General transfer comment: Pt stood 3 x total at EOB. Unable to advance to taking steps. Bed height elevated for pt/staff safety but lowered prior to patient sitting.    Ambulation/Gait  General Gait Details: Unable. Limited standing tolerance due to fatigue/weakness   Balance Overall balance assessment: Needs assistance Sitting-balance support: Bilateral upper extremity supported, Feet supported Sitting balance-Leahy Scale: Fair     Standing balance support: Bilateral upper extremity supported, Reliant on assistive device for balance, During functional activity Standing balance-Leahy Scale: Poor       Communication Communication Communication: No apparent difficulties  Cognition Arousal: Alert Behavior During Therapy: WFL for tasks assessed/performed   PT - Cognitive impairments: History of cognitive impairments   PT - Cognition Comments: Pt is alert and agreeable however authro question her insight of situation and actual orientation. Only truely oriented x 2 today Following commands: Intact Following commands impaired: Follows one step commands with increased time    Cueing Cueing Techniques: Verbal cues, Tactile cues     General Comments General comments (skin integrity, edema, etc.): Encouraged pt to continue to ice R ankle  and try to perform exercises while laying in bed.      Pertinent  Vitals/Pain Pain Assessment Pain Assessment: 0-10 Pain Score: 2  Pain Location: R ankle Pain Descriptors / Indicators: Discomfort Pain Intervention(s): Limited activity within patient's tolerance, Premedicated before session, Monitored during session, Repositioned, Ice applied     PT Goals (current goals can now be found in the care plan section) Acute Rehab PT Goals Patient Stated Goal: none stated Progress towards PT goals: Progressing toward goals    Frequency    Min 2X/week       Co-evaluation     PT goals addressed during session: Mobility/safety with mobility;Balance;Proper use of DME;Strengthening/ROM        AM-PAC PT 6 Clicks Mobility   Outcome Measure  Help needed turning from your back to your side while in a flat bed without using bedrails?: A Lot Help needed moving from lying on your back to sitting on the side of a flat bed without using bedrails?: A Lot Help needed moving to and from a bed to a chair (including a wheelchair)?: A Lot Help needed standing up from a chair using your arms (e.g., wheelchair or bedside chair)?: A Lot Help needed to walk in hospital room?: Total Help needed climbing 3-5 steps with a railing? : Total 6 Click Score: 10    End of Session   Activity Tolerance: Patient tolerated treatment well;Patient limited by fatigue Patient left: in bed;with call bell/phone within reach;with bed alarm set Nurse Communication: Mobility status PT Visit Diagnosis: Unsteadiness on feet (R26.81);Other abnormalities of gait and mobility (R26.89);Muscle weakness (generalized) (M62.81);History of falling (Z91.81);Difficulty in walking, not elsewhere classified (R26.2)     Time: 8866-8845 PT Time Calculation (min) (ACUTE ONLY): 21 min  Charges:    $Therapeutic Activity: 8-22 mins PT General Charges $$ ACUTE PT VISIT: 1 Visit                    Rankin Essex PTA 08/23/24, 12:12 PM

## 2024-08-23 NOTE — Discharge Summary (Signed)
 Physician Discharge Summary  Patient: Shelby [name] [name] DOB: 1941-06-16   Code Status: Full Code Admit date: 08/18/2024 Discharge date: 08/25/2024 Disposition: Skilled nursing facility, PT, OT, nurse aid, RN, Child psychotherapist, and wound care PCP: Steva Clotilda DEL, NP  Recommendations for Outpatient Follow-up:  Follow up with PCP within 1-2 weeks Regarding general hospital follow up and preventative care Recommend CBC, BMP Follow up with podiatry for ankle fracture  Discharge Diagnoses:  Principal Problem:   Urinary tract infection  Brief Hospital Course Summary: Shelby [name] is a 83 y.o. female with a PMH significant for HFrEF, anemia, dilated cardiomyopathy, type 2 diabetes, HTN, HLD, OSA, peripheral neuropathy, CKD 3, obesity, dementia, hyperparathyroidism, osteopenia, open-angle glaucoma, thrombocytopenia, restless leg syndrome, history of seizure-like activity, urinary incontinence. At baseline, they live with family and are mostly dependent for their ADLs.  At baseline she is ambulatory with a walker however, approximately 4 weeks ago she had an ankle fracture and has mostly been immobile since that point which has led to skin breakdown on her hips and sacrum.   They presented from home to the ED on 08/18/2024 with weakness x several days.  In the ED, it was found that they had stable vital signs.  Significant findings included: Na+ 145, K+ 3.9, glucose 162, creatinine 2.52 (baseline closer to 2).  UA positive for large leukocytes, many bacteria, greater than 50 WBCs.  Chest x-ray: Negative for acute cardiopulmonary processes.   They were initially treated with ceftriaxone , IV fluids. With urine culture resulting for klebsiella and E coli, with sensitivities to cephalosporins and remaining stable, she was transitioned to cefadroxil  PO to complete her treatment for cystitis.  Additionally, had a mild AKI on presentation in setting of her acute infection  which resolved back to baseline with supportive care.  WOC nurse was consulted an made recommendations for care of the skin breakdown insetting of her recent bed-bound status with incontinence.   She continued to have significant tenderness and swelling of her ankle fracture treated with analgesics.  PT/OT evaluated and recommended SNF at dc. Will need podiatry follow up.    Currently- patient is medically stable to dc to SNF when able. UxCx showing Klebsiella pneumoniae and E. coli (both sensitive to ceftriaxone ).  Continue oral therapy.   All other chronic conditions were treated with home medications.    Discharge Condition: Good, improved Recommended discharge diet: Regular healthy diet  Consultations: None   Procedures/Studies: None   Allergies as of 08/23/2024       Reactions   Furosemide  Other (See Comments)   Legs swelling        Medication List     STOP taking these medications    brimonidine 0.2 % ophthalmic solution Commonly known as: ALPHAGAN   Fish Oil 1000 MG Caps   Multi-Vitamins Tabs       TAKE these medications    acetaminophen  325 MG tablet Commonly known as: TYLENOL  Take 2 tablets (650 mg total) by mouth every 6 (six) hours as needed for mild pain (pain score 1-3) (or Fever >/= 100.4).   amLODipine  5 MG tablet Commonly known as: NORVASC  Take 10 mg by mouth daily.   ascorbic acid  500 MG tablet Commonly known as: VITAMIN C  Take 500 mg by mouth daily.   aspirin  EC 81 MG tablet Take 81 mg by mouth daily.   atorvastatin  80 MG tablet Commonly known as: LIPITOR Take 80 mg by mouth daily.   cefadroxil  500 MG capsule Commonly  known as: DURICEF Take 1 capsule (500 mg total) by mouth 2 (two) times daily for 1 day.   donepezil  10 MG tablet Commonly known as: ARICEPT  Take 10 mg by mouth at bedtime.   Farxiga  10 MG Tabs tablet Generic drug: dapagliflozin  propanediol Take 1 tablet by mouth daily.   ferrous sulfate  325 (65 FE) MG  tablet Take by mouth. Takes one pill every 2-3 days to minimize constipation   gabapentin  300 MG capsule Commonly known as: NEURONTIN  Take 300 mg by mouth 3 (three) times daily.   hydrochlorothiazide  12.5 MG capsule Commonly known as: MICROZIDE  Take 12.5 mg by mouth daily.   magnesium  oxide 400 MG tablet Commonly known as: MAG-OX Take 400 mg by mouth daily.   mirabegron  ER 50 MG Tb24 tablet Commonly known as: MYRBETRIQ  Take 1 tablet (50 mg total) by mouth daily.   nystatin  powder Commonly known as: MYCOSTATIN /NYSTOP  Apply topically as needed (Apply to Groin; Skin folds as needed).   travoprost  (benzalkonium) 0.004 % ophthalmic solution Commonly known as: TRAVATAN  Place 1 drop into both eyes at bedtime.   traZODone  50 MG tablet Commonly known as: DESYREL  Take 1 tablet by mouth at bedtime.        Follow-up Information     Steva Clotilda DEL, NP Follow up.   Specialty: Family Medicine Why: hospital follow up Contact information: [address] [place] [address] [phone]                 Subjective   Pt reports feeling some lower back pain today. Thinks its from being in bed too much. She is agreeable to working with PT today. Denies ankle pain at rest. No dysuria.   All questions and concerns were addressed at time of discharge.  Objective  Blood pressure (!) 122/59, pulse (!) 54, temperature 98 F (36.7 C), resp. rate 16, height 5' (1.524 m), weight 81 kg, SpO2 100%.   General: Pt is alert, awake, not in acute distress Cardiovascular: RRR, S1/S2 +, no rubs, no gallops Respiratory: CTA bilaterally, no wheezing, no rhonchi Abdominal: Soft, NT, ND, bowel sounds + Extremities: no edema, no cyanosis  The results of significant diagnostics from this hospitalization (including imaging, microbiology, ancillary and laboratory) are listed below for reference.   Imaging studies: DG Chest 2 View Result Date: 08/18/2024 CLINICAL DATA:  Weakness. EXAM:  CHEST - 2 VIEW COMPARISON:  Radiographs 07/21/2024 and 05/17/2024. Cardiac CT 08/24/2023. FINDINGS: 1151 hours. Mild patient rotation to the right. The heart size and mediastinal contours are stable with aortic atherosclerosis. Chronic elevation of the right hemidiaphragm with associated chronic bibasilar atelectasis or scarring. There is no edema, confluent airspace disease, pleural effusion or pneumothorax. Old fracture of the left 6th rib noted. No acute osseous findings are seen. IMPRESSION: No evidence of acute cardiopulmonary process. Chronic elevation of the right hemidiaphragm with associated bibasilar atelectasis or scarring. Electronically Signed   By: Elsie Perone M.D.   On: 08/18/2024 12:01   US  Venous Img Lower Unilateral Right (DVT) Result Date: 08/12/2024 CLINICAL DATA:  83 year old female with history of right lower extremity edema. EXAM: RIGHT LOWER EXTREMITY VENOUS DOPPLER ULTRASOUND TECHNIQUE: Gray-scale sonography with graded compression, as well as color Doppler and duplex ultrasound were performed to evaluate the right lower extremity deep venous systems from the level of the common femoral vein and including the common femoral, femoral, profunda femoral, popliteal and calf veins including the posterior tibial, peroneal and gastrocnemius veins when visible. Spectral Doppler was utilized to  evaluate flow at rest and with distal augmentation maneuvers in the common femoral, femoral and popliteal veins. The contralateral common femoral vein was also evaluated for comparison. COMPARISON:  None Available. FINDINGS: RIGHT LOWER EXTREMITY Common Femoral Vein: No evidence of thrombus. Normal compressibility, respiratory phasicity and response to augmentation. Central Greater Saphenous Vein: No evidence of thrombus. Normal compressibility and flow on color Doppler imaging. Central Profunda Femoral Vein: No evidence of thrombus. Normal compressibility and flow on color Doppler imaging. Femoral Vein:  No evidence of thrombus. Normal compressibility, respiratory phasicity and response to augmentation. Popliteal Vein: No evidence of thrombus. Normal compressibility, respiratory phasicity and response to augmentation. Calf Veins: No evidence of thrombus. Normal compressibility and flow on color Doppler imaging. Other Findings:  None. LEFT LOWER EXTREMITY Common Femoral Vein: No evidence of thrombus. Normal compressibility, respiratory phasicity and response to augmentation. IMPRESSION: No evidence of right lower extremity deep venous thrombosis. Ester Sides, MD [facility] Electronically Signed   By: Ester Sides M.D.   On: 08/12/2024 11:34   DG Ankle 2 Views Right Result Date: 08/08/2024 EXAM: 2 VIEW(S) XRAY OF THE RIGHT ANKLE 08/08/2024 11:25:00 AM CLINICAL HISTORY: Pain/swelling. Arrived by [facility] from home. Multiple falls this week. C/o right ankle pain. COMPARISON: None available. FINDINGS: BONES AND JOINTS: Oblique fracture of the distal fibula. Fracture fragments distracted 2 mm. No ankle subluxation. SOFT TISSUES: Anterior and lateral soft tissue swelling. Vascular calcifications. IMPRESSION: 1. Oblique fracture of the distal fibula with 2 mm distraction of fracture fragments. No ankle subluxation. 2. Anterior and lateral soft tissue swelling. Electronically signed by: Selinda Blue MD 08/08/2024 11:50 AM EDT RP Workstation: [id number]   DG Knee Complete 4 Views Right Result Date: 08/08/2024 CLINICAL DATA:  fall EXAM: RIGHT KNEE - COMPLETE 4+ VIEW; LEFT KNEE - COMPLETE 4+ VIEW COMPARISON:  None Available. FINDINGS: RIGHT knee: No acute fracture or dislocation. Mild to moderate joint space narrowing and osteophyte formation of the medial compartment. Mild osteophyte formation of the lateral and patellofemoral compartments. No area of erosion or osseous destruction. No unexpected radiopaque foreign body. Soft tissues are unremarkable. LEFT  knee: No acute fracture or dislocation. Mild-to-moderate joint space narrowing and osteophyte formation of the medial compartment. Mild osteophyte formation of the patellofemoral compartment. Bidirectional patellar enthesophytes. No area of erosion or osseous destruction. No unexpected radiopaque foreign body. Soft tissues are unremarkable. IMPRESSION: 1. No acute fracture or dislocation. 2. Mild-to-moderate degenerative changes of the bilateral medial compartments. Electronically Signed   By: Corean Salter M.D.   On: 08/08/2024 11:13   DG Knee Complete 4 Views Left Result Date: 08/08/2024 CLINICAL DATA:  fall EXAM: RIGHT KNEE - COMPLETE 4+ VIEW; LEFT KNEE - COMPLETE 4+ VIEW COMPARISON:  None Available. FINDINGS: RIGHT knee: No acute fracture or dislocation. Mild to moderate joint space narrowing and osteophyte formation of the medial compartment. Mild osteophyte formation of the lateral and patellofemoral compartments. No area of erosion or osseous destruction. No unexpected radiopaque foreign body. Soft tissues are unremarkable. LEFT knee: No acute fracture or dislocation. Mild-to-moderate joint space narrowing and osteophyte formation of the medial compartment. Mild osteophyte formation of the patellofemoral compartment. Bidirectional patellar enthesophytes. No area of erosion or osseous destruction. No unexpected radiopaque foreign body. Soft tissues are unremarkable. IMPRESSION: 1. No acute fracture or dislocation. 2. Mild-to-moderate degenerative changes of the bilateral medial compartments. Electronically Signed   By: Corean Salter M.D.   On: 08/08/2024 11:13    Labs: Basic Metabolic Panel:  Recent Labs  Lab 08/18/24 1137 08/19/24 0529 08/21/24 0815  NA 145 148* 142  K 3.9 3.6 3.9  CL 99 106 104  CO2 34* 34* 30  GLUCOSE 162* 99 109*  BUN 74* 67* 35*  CREATININE 2.52* 2.09* 1.46*  CALCIUM  11.4* 10.5* 11.0*   CBC: Recent Labs  Lab 08/18/24 1137 08/19/24 0529 08/21/24 0815   WBC 9.2 8.4 6.5  HGB 12.6 10.8* 11.2*  HCT 40.9 34.1* 34.9*  MCV 98.3 97.7 96.9  PLT 179 156 224   Microbiology: Results for orders placed or performed during the hospital encounter of 08/18/24  Resp panel by RT-PCR (RSV, Flu A&B, Covid) Anterior Nasal Swab     Status: None   Collection Time: 08/18/24  1:12 PM   Specimen: Anterior Nasal Swab  Result Value Ref Range Status   SARS Coronavirus 2 by RT PCR NEGATIVE NEGATIVE Final    Comment: (NOTE) SARS-CoV-2 target nucleic acids are NOT DETECTED.  The SARS-CoV-2 RNA is generally detectable in upper respiratory specimens during the acute phase of infection. The lowest concentration of SARS-CoV-2 viral copies this assay can detect is 138 copies/mL. A negative result does not preclude SARS-Cov-2 infection and should not be used as the sole basis for treatment or other patient management decisions. A negative result may occur with  improper specimen collection/handling, submission of specimen other than nasopharyngeal swab, presence of viral mutation(s) within the areas targeted by this assay, and inadequate number of viral copies(<138 copies/mL). A negative result must be combined with clinical observations, patient history, and epidemiological information. The expected result is Negative.  Fact Sheet for Patients:  BloggerCourse.com  Fact Sheet for Healthcare Providers:  SeriousBroker.it  This test is no t yet approved or cleared by the [place]  FDA and  has been authorized for detection and/or diagnosis of SARS-CoV-2 by FDA under an Emergency Use Authorization (EUA). This EUA will remain  in effect (meaning this test can be used) for the duration of the COVID-19 declaration under Section 564(b)(1) of the Act, 21 U.S.C.section 360bbb-3(b)(1), unless the authorization is terminated  or revoked sooner.       Influenza A by PCR NEGATIVE NEGATIVE Final   Influenza B by PCR  NEGATIVE NEGATIVE Final    Comment: (NOTE) The Xpert Xpress SARS-CoV-2/FLU/RSV plus assay is intended as an aid in the diagnosis of influenza from Nasopharyngeal swab specimens and should not be used as a sole basis for treatment. Nasal washings and aspirates are unacceptable for Xpert Xpress SARS-CoV-2/FLU/RSV testing.  Fact Sheet for Patients: BloggerCourse.com  Fact Sheet for Healthcare Providers: SeriousBroker.it  This test is not yet approved or cleared by the [place]  FDA and has been authorized for detection and/or diagnosis of SARS-CoV-2 by FDA under an Emergency Use Authorization (EUA). This EUA will remain in effect (meaning this test can be used) for the duration of the COVID-19 declaration under Section 564(b)(1) of the Act, 21 U.S.C. section 360bbb-3(b)(1), unless the authorization is terminated or revoked.     Resp Syncytial Virus by PCR NEGATIVE NEGATIVE Final    Comment: (NOTE) Fact Sheet for Patients: BloggerCourse.com  Fact Sheet for Healthcare Providers: SeriousBroker.it  This test is not yet approved or cleared by the [place]  FDA and has been authorized for detection and/or diagnosis of SARS-CoV-2 by FDA under an Emergency Use Authorization (EUA). This EUA will remain in effect (meaning this test can be used) for the duration of the COVID-19 declaration under Section 564(b)(1) of the Act, 21 U.S.C.  section 360bbb-3(b)(1), unless the authorization is terminated or revoked.  Performed at [facility], [address] [place], [place] [address]   Urine Culture     Status: Abnormal   Collection Time: 08/18/24  1:12 PM   Specimen: Urine, Catheterized  Result Value Ref Range Status   Specimen Description   Final    URINE, CATHETERIZED Performed at [facility], [address] [place], [place] [address]    Special Requests    Final    NONE Performed at [facility], [address] [place], [place] [address]    Culture (A)  Final    >=100,000 COLONIES/mL KLEBSIELLA PNEUMONIAE >=100,000 COLONIES/mL ESCHERICHIA COLI    Report Status 08/21/2024 FINAL  Final   Organism ID, Bacteria KLEBSIELLA PNEUMONIAE (A)  Final   Organism ID, Bacteria ESCHERICHIA COLI (A)  Final      Susceptibility   Escherichia coli - MIC*    AMPICILLIN <=2 SENSITIVE Sensitive     CEFAZOLIN (URINE) Value in next row Sensitive      <=1 SENSITIVEThis is a modified FDA-approved test that has been validated and its performance characteristics determined by the reporting laboratory.  This laboratory is certified under the Clinical Laboratory Improvement Amendments CLIA as qualified to perform high complexity clinical laboratory testing.    CEFEPIME Value in next row Sensitive      <=1 SENSITIVEThis is a modified FDA-approved test that has been validated and its performance characteristics determined by the reporting laboratory.  This laboratory is certified under the Clinical Laboratory Improvement Amendments CLIA as qualified to perform high complexity clinical laboratory testing.    ERTAPENEM Value in next row Sensitive      <=1 SENSITIVEThis is a modified FDA-approved test that has been validated and its performance characteristics determined by the reporting laboratory.  This laboratory is certified under the Clinical Laboratory Improvement Amendments CLIA as qualified to perform high complexity clinical laboratory testing.    CEFTRIAXONE  Value in next row Sensitive      <=1 SENSITIVEThis is a modified FDA-approved test that has been validated and its performance characteristics determined by the reporting laboratory.  This laboratory is certified under the Clinical Laboratory Improvement Amendments CLIA as qualified to perform high complexity clinical laboratory testing.    CIPROFLOXACIN Value in next row Sensitive      <=1 SENSITIVEThis  is a modified FDA-approved test that has been validated and its performance characteristics determined by the reporting laboratory.  This laboratory is certified under the Clinical Laboratory Improvement Amendments CLIA as qualified to perform high complexity clinical laboratory testing.    GENTAMICIN Value in next row Sensitive      <=1 SENSITIVEThis is a modified FDA-approved test that has been validated and its performance characteristics determined by the reporting laboratory.  This laboratory is certified under the Clinical Laboratory Improvement Amendments CLIA as qualified to perform high complexity clinical laboratory testing.    NITROFURANTOIN Value in next row Sensitive      <=1 SENSITIVEThis is a modified FDA-approved test that has been validated and its performance characteristics determined by the reporting laboratory.  This laboratory is certified under the Clinical Laboratory Improvement Amendments CLIA as qualified to perform high complexity clinical laboratory testing.    TRIMETH/SULFA Value in next row Sensitive      <=1 SENSITIVEThis is a modified FDA-approved test that has been validated and its performance characteristics determined by the reporting laboratory.  This laboratory is certified under the Clinical Laboratory Improvement Amendments CLIA  as qualified to perform high complexity clinical laboratory testing.    AMPICILLIN/SULBACTAM Value in next row Sensitive      <=1 SENSITIVEThis is a modified FDA-approved test that has been validated and its performance characteristics determined by the reporting laboratory.  This laboratory is certified under the Clinical Laboratory Improvement Amendments CLIA as qualified to perform high complexity clinical laboratory testing.    PIP/TAZO Value in next row Sensitive ug/mL     <=4 SENSITIVEThis is a modified FDA-approved test that has been validated and its performance characteristics determined by the reporting laboratory.  This laboratory  is certified under the Clinical Laboratory Improvement Amendments CLIA as qualified to perform high complexity clinical laboratory testing.    MEROPENEM Value in next row Sensitive      <=4 SENSITIVEThis is a modified FDA-approved test that has been validated and its performance characteristics determined by the reporting laboratory.  This laboratory is certified under the Clinical Laboratory Improvement Amendments CLIA as qualified to perform high complexity clinical laboratory testing.    * >=100,000 COLONIES/mL ESCHERICHIA COLI   Klebsiella pneumoniae - MIC*    AMPICILLIN Value in next row Resistant      <=4 SENSITIVEThis is a modified FDA-approved test that has been validated and its performance characteristics determined by the reporting laboratory.  This laboratory is certified under the Clinical Laboratory Improvement Amendments CLIA as qualified to perform high complexity clinical laboratory testing.    CEFAZOLIN (URINE) Value in next row Sensitive      2 SENSITIVEThis is a modified FDA-approved test that has been validated and its performance characteristics determined by the reporting laboratory.  This laboratory is certified under the Clinical Laboratory Improvement Amendments CLIA as qualified to perform high complexity clinical laboratory testing.    CEFEPIME Value in next row Sensitive      2 SENSITIVEThis is a modified FDA-approved test that has been validated and its performance characteristics determined by the reporting laboratory.  This laboratory is certified under the Clinical Laboratory Improvement Amendments CLIA as qualified to perform high complexity clinical laboratory testing.    ERTAPENEM Value in next row Sensitive      2 SENSITIVEThis is a modified FDA-approved test that has been validated and its performance characteristics determined by the reporting laboratory.  This laboratory is certified under the Clinical Laboratory Improvement Amendments CLIA as qualified to perform  high complexity clinical laboratory testing.    CEFTRIAXONE  Value in next row Sensitive      2 SENSITIVEThis is a modified FDA-approved test that has been validated and its performance characteristics determined by the reporting laboratory.  This laboratory is certified under the Clinical Laboratory Improvement Amendments CLIA as qualified to perform high complexity clinical laboratory testing.    CIPROFLOXACIN Value in next row Sensitive      2 SENSITIVEThis is a modified FDA-approved test that has been validated and its performance characteristics determined by the reporting laboratory.  This laboratory is certified under the Clinical Laboratory Improvement Amendments CLIA as qualified to perform high complexity clinical laboratory testing.    GENTAMICIN Value in next row Sensitive      2 SENSITIVEThis is a modified FDA-approved test that has been validated and its performance characteristics determined by the reporting laboratory.  This laboratory is certified under the Clinical Laboratory Improvement Amendments CLIA as qualified to perform high complexity clinical laboratory testing.    NITROFURANTOIN Value in next row Intermediate      2 SENSITIVEThis is a modified FDA-approved test that has been  validated and its performance characteristics determined by the reporting laboratory.  This laboratory is certified under the Clinical Laboratory Improvement Amendments CLIA as qualified to perform high complexity clinical laboratory testing.    TRIMETH/SULFA Value in next row Sensitive      2 SENSITIVEThis is a modified FDA-approved test that has been validated and its performance characteristics determined by the reporting laboratory.  This laboratory is certified under the Clinical Laboratory Improvement Amendments CLIA as qualified to perform high complexity clinical laboratory testing.    AMPICILLIN/SULBACTAM Value in next row Sensitive      2 SENSITIVEThis is a modified FDA-approved test that has been  validated and its performance characteristics determined by the reporting laboratory.  This laboratory is certified under the Clinical Laboratory Improvement Amendments CLIA as qualified to perform high complexity clinical laboratory testing.    PIP/TAZO Value in next row Sensitive ug/mL     <=4 SENSITIVEThis is a modified FDA-approved test that has been validated and its performance characteristics determined by the reporting laboratory.  This laboratory is certified under the Clinical Laboratory Improvement Amendments CLIA as qualified to perform high complexity clinical laboratory testing.    MEROPENEM Value in next row Sensitive      <=4 SENSITIVEThis is a modified FDA-approved test that has been validated and its performance characteristics determined by the reporting laboratory.  This laboratory is certified under the Clinical Laboratory Improvement Amendments CLIA as qualified to perform high complexity clinical laboratory testing.    * >=100,000 COLONIES/mL KLEBSIELLA PNEUMONIAE  Culture, blood (Routine X 2) w Reflex to ID Panel     Status: None   Collection Time: 08/18/24  3:45 PM   Specimen: BLOOD  Result Value Ref Range Status   Specimen Description BLOOD BLOOD RIGHT HAND  Final   Special Requests   Final    BOTTLES DRAWN AEROBIC AND ANAEROBIC Blood Culture results may not be optimal due to an inadequate volume of blood received in culture bottles   Culture   Final    NO GROWTH 5 DAYS Performed at [facility], [address] [place], [place] [address]    Report Status 08/23/2024 FINAL  Final  Culture, blood (Routine X 2) w Reflex to ID Panel     Status: None   Collection Time: 08/18/24  3:45 PM   Specimen: BLOOD  Result Value Ref Range Status   Specimen Description BLOOD BLOOD LEFT HAND  Final   Special Requests   Final    AEROBIC BOTTLE ONLY Blood Culture results may not be optimal due to an inadequate volume of blood received in culture bottles   Culture   Final     NO GROWTH 5 DAYS Performed at [facility], [address], [place], [place] [address]    Report Status 08/23/2024 FINAL  Final    Time coordinating discharge: Over 30 minutes  Calvin Robson MD 08/25/2024

## 2024-08-23 NOTE — TOC Progression Note (Signed)
 Transition of Care [facility]) - Progression Note    Patient Details  Name: Shelby [name] MRN: [id number] Date of Birth: Nov 11, 1941  Transition of [facility]) CM/SW Contact  [name] Fuse, RN Phone Number: 08/23/2024, 2:34 PM  Clinical Narrative:     [facility] received for [employer]. TOC accepted the facility in the hub and LVMM for [employer] advising of the auth.  TOC is awaiting f/u for [facility].     Approved JluyPI:[id number] Dates:9/16-9/18/2025 Next Review Date: 08/25/2024                Expected Discharge Plan and Services         Expected Discharge Date: 08/23/24                                     Social Drivers of Health (SDOH) Interventions SDOH Screenings   Food Insecurity: No Food Insecurity (08/18/2024)  Housing: Unknown (08/18/2024)  Transportation Needs: No Transportation Needs (08/18/2024)  Utilities: Not At Risk (08/18/2024)  Alcohol Screen: Low Risk  (12/20/2018)  Financial Resource Strain: Low Risk  (03/01/2024)   Received from [facility]  Physical Activity: Inactive (03/19/2021)   Received from [facility]  Social Connections: Socially Isolated (08/18/2024)  Stress: No Stress Concern Present (03/19/2021)   Received from [facility]  Tobacco Use: Low Risk  (08/18/2024)    Readmission Risk Interventions     No data to display

## 2024-08-23 NOTE — Progress Notes (Signed)
 Occupational Therapy Treatment Patient Details Name: Shelby [name] MRN: [id number] DOB: 12-Aug-1941 Today's Date: 08/23/2024   History of present illness Pt is an 83 y/o F presenting to ED with c/o weakness. Recent ED visit on 9/1 after a fall that resulted in a L distal fibula fracture, closed nondisplaced fracture of right lateral malleolus. PMH significant for HFrEF, anemia, T2DM, HTN, HLD, OSA, peripheral neuropathy, CKD-III, dementia, glaucoma.   OT comments  Pt seen for OT treatment on this date. Upon arrival to room pt supine in bed, agreeable to tx. Pt requires Min A for bed mobility to access EOB for ADL task engagement.  Pt complete grooming/hygiene tasks with Fair+ sitting balance including functional reaching to needed items, pt with instability noted with reaching outside of BOS to access items.   Pt making progress toward goals, will continue to follow POC. Barriers at this time include current level of assistance required for LB ADL task engagement, functional transfers, ongoing UB weakness which limited independence with ADL task engagement.  Discharge recommendation remains appropriate.        If plan is discharge home, recommend the following:  Two people to help with walking and/or transfers;A lot of help with bathing/dressing/bathroom;Direct supervision/assist for medications management;Supervision due to cognitive status;Direct supervision/assist for financial management;Assist for transportation;Assistance with cooking/housework;Assistance with feeding;Help with stairs or ramp for entrance   Equipment Recommendations  Other (comment) (Defer to next venue of care)    Recommendations for Other Services      Precautions / Restrictions Precautions Precautions: Fall Recall of Precautions/Restrictions: Intact Precaution/Restrictions Comments: per recent podiatry note, pt is instructed to wear a boot on L foot for mobility, not present in room at eval ( pt refused  wearing a boot this session) I feel better without it Restrictions Weight Bearing Restrictions Per Provider Order: No Other Position/Activity Restrictions: Per podiatry note on 9/5: Advise use of a boot for weight-bearing activities such as transferring to a wheelchair or using the restroom.  - Instruct to rest and elevate the leg to aid in healing.  - Apply a compression wrap to the ankle to provide support and reduce swelling.       Mobility Bed Mobility   Bed Mobility: Supine to Sit, Sit to Supine     Supine to sit: Min assist, HOB elevated, Used rails Sit to supine: Min assist, HOB elevated, Used rails   General bed mobility comments: Increased time to complete however able to complete supine <->sit EOB transfers with greater independence using BUEs    Transfers                         Balance Overall balance assessment: Needs assistance Sitting-balance support: Single extremity supported, No upper extremity supported, Feet supported Sitting balance-Leahy Scale: Fair Sitting balance - Comments: Completed ADLs seated at EOB                                   ADL either performed or assessed with clinical judgement   ADL Overall ADL's : Needs assistance/impaired     Grooming: Sitting;Supervision/safety;Wash/dry face;Set up;Oral care Grooming Details (indicate cue type and reason): Grooming/hygiene and oral care tasks completed seated EOB                                    Extremity/Trunk  Assessment Upper Extremity Assessment Upper Extremity Assessment: Generalized weakness            Vision Patient Visual Report: No change from baseline     Perception     Praxis     Communication Communication Communication: No apparent difficulties   Cognition Arousal: Alert Behavior During Therapy: WFL for tasks assessed/performed Cognition: History of cognitive impairments, No family/caregiver present to determine baseline                                Following commands: Intact Following commands impaired: Follows one step commands with increased time      Cueing   Cueing Techniques: Verbal cues, Tactile cues  Exercises Other Exercises Other Exercises: Education on technique for bed mobility at access EOB for ADL task engagement.    Shoulder Instructions       General Comments Encouraged pt to continue to ice R ankle and try to perform exercises while laying in bed.    Pertinent Vitals/ Pain       Pain Assessment Pain Assessment: 0-10 Pain Score: 2  Pain Location: R ankle Pain Descriptors / Indicators: Discomfort Pain Intervention(s): Monitored during session, Repositioned  Home Living                                          Prior Functioning/Environment              Frequency  Min 2X/week        Progress Toward Goals  OT Goals(current goals can now be found in the care plan section)  Progress towards OT goals: Progressing toward goals  Acute Rehab OT Goals Potential to Achieve Goals: Fair  Plan      Co-evaluation        PT goals addressed during session: Mobility/safety with mobility;Balance;Proper use of DME;Strengthening/ROM        AM-PAC OT 6 Clicks Daily Activity     Outcome Measure   Help from another person eating meals?: None Help from another person taking care of personal grooming?: A Little Help from another person toileting, which includes using toliet, bedpan, or urinal?: A Lot Help from another person bathing (including washing, rinsing, drying)?: A Lot Help from another person to put on and taking off regular upper body clothing?: A Little Help from another person to put on and taking off regular lower body clothing?: A Lot 6 Click Score: 16    End of Session    OT Visit Diagnosis: Other abnormalities of gait and mobility (R26.89);Muscle weakness (generalized) (M62.81)   Activity Tolerance     Patient Left in  bed;with call bell/phone within reach;with bed alarm set;with family/visitor present   Nurse Communication Mobility status        Time: 8790-8768 OT Time Calculation (min): 22 min  Charges: OT General Charges $OT Visit: 1 Visit OT Treatments $Self Care/Home Management : 8-22 mins  Harlene Sharps OTR/L   Harlene LITTIE Sharps 08/23/2024, 1:57 PM

## 2024-08-24 DIAGNOSIS — N3000 Acute cystitis without hematuria: Secondary | ICD-10-CM

## 2024-08-24 NOTE — Progress Notes (Signed)
 PROGRESS NOTE    Shelby [name]  [name] DOB: [birth year] DOA: 08/18/2024 PCP: Steva Clotilda DEL, NP    Brief Narrative:  83 y.o. female with a PMH significant for HFrEF, anemia, dilated cardiomyopathy, type 2 diabetes, HTN, HLD, OSA, peripheral neuropathy, CKD 3, obesity, dementia, hyperparathyroidism, osteopenia, open-angle glaucoma, thrombocytopenia, restless leg syndrome, history of seizure-like activity, urinary incontinence. At baseline, they live with family and are mostly dependent for their ADLs.  At baseline she is ambulatory with a walker however, approximately 4 weeks ago she had an ankle fracture and has mostly been immobile since that point which has led to skin breakdown on her hips and sacrum.   Assessment & Plan:   Principal Problem:   Urinary tract infection UTI-UxCx showing Klebsiella pneumoniae and E. coli (both sensitive to ceftriaxone ).   - blood cultures NGTD.  Completed 5-day course of antibiotic.  Medically ready for discharge.  Generalized weakness- in setting of infection and recent injury.  Skilled nursing facility.  Discharge delayed by lack of beds   AKI on CKDIIIb- Cr returned back to baseline. 1.46 today - avoid nephrotoxic agents - renal dosing meds   Incontinence-associated dermatitis  pressure breakdown of skin- wounds do not appear to be acutely infected - WOC consulted.  Wound care recommendations appreciated   POA closed low lateral malleolus fracture, right- treated conservatively by podiatry outpatient - Apply ice and elevation - Analgesia as needed - PT   HFrEF- on carvedilol  at baseline. Holding in setting of bradycardia. Does not appear to be in acute exacerbation. Last echo on 6/11.  - restarting home diuretics   Dementia- continue home donepezil , trazodone    HTN- continue home amlodipine , HCTZ   HLD- continue home atorvastatin      DVT prophylaxis: SQH Code Status: Full Family Communication: Disposition Plan:  Status is: Observation The patient will require care spanning > 2 midnights and should be moved to inpatient because: Unsafe discharge plan.  Tentative plan discharge to [facility] 9/18   Level of care: Med-Surg  Consultants:  None  Procedures:  None  Antimicrobials: None   Subjective: Seen and examined.  Sting in bed.  No visible distress  Objective: Vitals:   08/23/24 2048 08/24/24 0435 08/24/24 0437 08/24/24 0822  BP: 92/60  (!) 115/59 (!) 112/48  Pulse: 63  62 (!) 56  Resp: 18 17 17 15   Temp: 98 F (36.7 C)  98.4 F (36.9 C) 98.5 F (36.9 C)  TempSrc: Oral   Oral  SpO2: 100%  95% 100%  Weight:      Height:        Intake/Output Summary (Last 24 hours) at 08/24/2024 1454 Last data filed at 08/24/2024 1341 Gross per 24 hour  Intake 660 ml  Output --  Net 660 ml   Filed Weights   08/18/24 1134  Weight: 81 kg    Examination:  General exam: Appears calm and comfortable  Respiratory system: Clear to auscultation. Respiratory effort normal. Cardiovascular system: S1-S2, RRR, no murmurs, no pedal edema Gastrointestinal system: Soft NT/ND, normal bowel sounds Central nervous system: Alert.  Oriented x 2.  No focal deficits Extremities: Symmetric 5 x 5 power. Skin: No rashes, lesions or ulcers Psychiatry: Judgement and insight appear normal. Mood & affect appropriate.     Data Reviewed: I have personally reviewed following labs and imaging studies  CBC: Recent Labs  Lab 08/18/24 1137 08/19/24 0529 08/21/24 0815  WBC 9.2 8.4 6.5  HGB 12.6 10.8* 11.2*  HCT 40.9 34.1*  34.9*  MCV 98.3 97.7 96.9  PLT 179 156 224   Basic Metabolic Panel: Recent Labs  Lab 08/18/24 1137 08/19/24 0529 08/21/24 0815  NA 145 148* 142  K 3.9 3.6 3.9  CL 99 106 104  CO2 34* 34* 30  GLUCOSE 162* 99 109*  BUN 74* 67* 35*  CREATININE 2.52* 2.09* 1.46*  CALCIUM  11.4* 10.5* 11.0*   GFR: Estimated Creatinine Clearance: 28.5 mL/min (A) (by C-G formula based on SCr  of 1.46 mg/dL (H)). Liver Function Tests: No results for input(s): AST, ALT, ALKPHOS, BILITOT, PROT, ALBUMIN  in the last 168 hours. No results for input(s): LIPASE, AMYLASE in the last 168 hours. No results for input(s): AMMONIA in the last 168 hours. Coagulation Profile: No results for input(s): INR, PROTIME in the last 168 hours. Cardiac Enzymes: No results for input(s): CKTOTAL, CKMB, CKMBINDEX, TROPONINI in the last 168 hours. BNP (last 3 results) No results for input(s): PROBNP in the last 8760 hours. HbA1C: No results for input(s): HGBA1C in the last 72 hours. CBG: No results for input(s): GLUCAP in the last 168 hours. Lipid Profile: No results for input(s): CHOL, HDL, LDLCALC, TRIG, CHOLHDL, LDLDIRECT in the last 72 hours. Thyroid  Function Tests: No results for input(s): TSH, T4TOTAL, FREET4, T3FREE, THYROIDAB in the last 72 hours. Anemia Panel: No results for input(s): VITAMINB12, FOLATE, FERRITIN, TIBC, IRON, RETICCTPCT in the last 72 hours. Sepsis Labs: No results for input(s): PROCALCITON, LATICACIDVEN in the last 168 hours.  Recent Results (from the past 240 hours)  Resp panel by RT-PCR (RSV, Flu A&B, Covid) Anterior Nasal Swab     Status: None   Collection Time: 08/18/24  1:12 PM   Specimen: Anterior Nasal Swab  Result Value Ref Range Status   SARS Coronavirus 2 by RT PCR NEGATIVE NEGATIVE Final    Comment: (NOTE) SARS-CoV-2 target nucleic acids are NOT DETECTED.  The SARS-CoV-2 RNA is generally detectable in upper respiratory specimens during the acute phase of infection. The lowest concentration of SARS-CoV-2 viral copies this assay can detect is 138 copies/mL. A negative result does not preclude SARS-Cov-2 infection and should not be used as the sole basis for treatment or other patient management decisions. A negative result may occur with  improper specimen collection/handling,  submission of specimen other than nasopharyngeal swab, presence of viral mutation(s) within the areas targeted by this assay, and inadequate number of viral copies(<138 copies/mL). A negative result must be combined with clinical observations, patient history, and epidemiological information. The expected result is Negative.  Fact Sheet for Patients:  BloggerCourse.com  Fact Sheet for Healthcare Providers:  SeriousBroker.it  This test is no t yet approved or cleared by the [place]  FDA and  has been authorized for detection and/or diagnosis of SARS-CoV-2 by FDA under an Emergency Use Authorization (EUA). This EUA will remain  in effect (meaning this test can be used) for the duration of the COVID-19 declaration under Section 564(b)(1) of the Act, 21 U.S.C.section 360bbb-3(b)(1), unless the authorization is terminated  or revoked sooner.       Influenza A by PCR NEGATIVE NEGATIVE Final   Influenza B by PCR NEGATIVE NEGATIVE Final    Comment: (NOTE) The Xpert Xpress SARS-CoV-2/FLU/RSV plus assay is intended as an aid in the diagnosis of influenza from Nasopharyngeal swab specimens and should not be used as a sole basis for treatment. Nasal washings and aspirates are unacceptable for Xpert Xpress SARS-CoV-2/FLU/RSV testing.  Fact Sheet for Patients: BloggerCourse.com  Fact Sheet for Healthcare Providers:  SeriousBroker.it  This test is not yet approved or cleared by the [place]  FDA and has been authorized for detection and/or diagnosis of SARS-CoV-2 by FDA under an Emergency Use Authorization (EUA). This EUA will remain in effect (meaning this test can be used) for the duration of the COVID-19 declaration under Section 564(b)(1) of the Act, 21 U.S.C. section 360bbb-3(b)(1), unless the authorization is terminated or revoked.     Resp Syncytial Virus by PCR NEGATIVE  NEGATIVE Final    Comment: (NOTE) Fact Sheet for Patients: BloggerCourse.com  Fact Sheet for Healthcare Providers: SeriousBroker.it  This test is not yet approved or cleared by the [place]  FDA and has been authorized for detection and/or diagnosis of SARS-CoV-2 by FDA under an Emergency Use Authorization (EUA). This EUA will remain in effect (meaning this test can be used) for the duration of the COVID-19 declaration under Section 564(b)(1) of the Act, 21 U.S.C. section 360bbb-3(b)(1), unless the authorization is terminated or revoked.  Performed at [facility], [address] [place], [place] [address]   Urine Culture     Status: Abnormal   Collection Time: 08/18/24  1:12 PM   Specimen: Urine, Catheterized  Result Value Ref Range Status   Specimen Description   Final    URINE, CATHETERIZED Performed at [facility], [address], [place], [place] [address]    Special Requests   Final    NONE Performed at [facility], [address] [place], [place] [address]    Culture (A)  Final    >=100,000 COLONIES/mL KLEBSIELLA PNEUMONIAE >=100,000 COLONIES/mL ESCHERICHIA COLI    Report Status 08/21/2024 FINAL  Final   Organism ID, Bacteria KLEBSIELLA PNEUMONIAE (A)  Final   Organism ID, Bacteria ESCHERICHIA COLI (A)  Final      Susceptibility   Escherichia coli - MIC*    AMPICILLIN <=2 SENSITIVE Sensitive     CEFAZOLIN (URINE) Value in next row Sensitive      <=1 SENSITIVEThis is a modified FDA-approved test that has been validated and its performance characteristics determined by the reporting laboratory.  This laboratory is certified under the Clinical Laboratory Improvement Amendments CLIA as qualified to perform high complexity clinical laboratory testing.    CEFEPIME Value in next row Sensitive      <=1 SENSITIVEThis is a modified FDA-approved test that has been validated and its  performance characteristics determined by the reporting laboratory.  This laboratory is certified under the Clinical Laboratory Improvement Amendments CLIA as qualified to perform high complexity clinical laboratory testing.    ERTAPENEM Value in next row Sensitive      <=1 SENSITIVEThis is a modified FDA-approved test that has been validated and its performance characteristics determined by the reporting laboratory.  This laboratory is certified under the Clinical Laboratory Improvement Amendments CLIA as qualified to perform high complexity clinical laboratory testing.    CEFTRIAXONE  Value in next row Sensitive      <=1 SENSITIVEThis is a modified FDA-approved test that has been validated and its performance characteristics determined by the reporting laboratory.  This laboratory is certified under the Clinical Laboratory Improvement Amendments CLIA as qualified to perform high complexity clinical laboratory testing.    CIPROFLOXACIN Value in next row Sensitive      <=1 SENSITIVEThis is a modified FDA-approved test that has been validated and its performance characteristics determined by the reporting laboratory.  This laboratory is certified under the Clinical Laboratory Improvement Amendments CLIA as qualified to perform high complexity clinical laboratory  testing.    GENTAMICIN Value in next row Sensitive      <=1 SENSITIVEThis is a modified FDA-approved test that has been validated and its performance characteristics determined by the reporting laboratory.  This laboratory is certified under the Clinical Laboratory Improvement Amendments CLIA as qualified to perform high complexity clinical laboratory testing.    NITROFURANTOIN Value in next row Sensitive      <=1 SENSITIVEThis is a modified FDA-approved test that has been validated and its performance characteristics determined by the reporting laboratory.  This laboratory is certified under the Clinical Laboratory Improvement Amendments CLIA as  qualified to perform high complexity clinical laboratory testing.    TRIMETH/SULFA Value in next row Sensitive      <=1 SENSITIVEThis is a modified FDA-approved test that has been validated and its performance characteristics determined by the reporting laboratory.  This laboratory is certified under the Clinical Laboratory Improvement Amendments CLIA as qualified to perform high complexity clinical laboratory testing.    AMPICILLIN/SULBACTAM Value in next row Sensitive      <=1 SENSITIVEThis is a modified FDA-approved test that has been validated and its performance characteristics determined by the reporting laboratory.  This laboratory is certified under the Clinical Laboratory Improvement Amendments CLIA as qualified to perform high complexity clinical laboratory testing.    PIP/TAZO Value in next row Sensitive ug/mL     <=4 SENSITIVEThis is a modified FDA-approved test that has been validated and its performance characteristics determined by the reporting laboratory.  This laboratory is certified under the Clinical Laboratory Improvement Amendments CLIA as qualified to perform high complexity clinical laboratory testing.    MEROPENEM Value in next row Sensitive      <=4 SENSITIVEThis is a modified FDA-approved test that has been validated and its performance characteristics determined by the reporting laboratory.  This laboratory is certified under the Clinical Laboratory Improvement Amendments CLIA as qualified to perform high complexity clinical laboratory testing.    * >=100,000 COLONIES/mL ESCHERICHIA COLI   Klebsiella pneumoniae - MIC*    AMPICILLIN Value in next row Resistant      <=4 SENSITIVEThis is a modified FDA-approved test that has been validated and its performance characteristics determined by the reporting laboratory.  This laboratory is certified under the Clinical Laboratory Improvement Amendments CLIA as qualified to perform high complexity clinical laboratory testing.     CEFAZOLIN (URINE) Value in next row Sensitive      2 SENSITIVEThis is a modified FDA-approved test that has been validated and its performance characteristics determined by the reporting laboratory.  This laboratory is certified under the Clinical Laboratory Improvement Amendments CLIA as qualified to perform high complexity clinical laboratory testing.    CEFEPIME Value in next row Sensitive      2 SENSITIVEThis is a modified FDA-approved test that has been validated and its performance characteristics determined by the reporting laboratory.  This laboratory is certified under the Clinical Laboratory Improvement Amendments CLIA as qualified to perform high complexity clinical laboratory testing.    ERTAPENEM Value in next row Sensitive      2 SENSITIVEThis is a modified FDA-approved test that has been validated and its performance characteristics determined by the reporting laboratory.  This laboratory is certified under the Clinical Laboratory Improvement Amendments CLIA as qualified to perform high complexity clinical laboratory testing.    CEFTRIAXONE  Value in next row Sensitive      2 SENSITIVEThis is a modified FDA-approved test that has been validated and its performance characteristics determined by the  reporting laboratory.  This laboratory is certified under the Clinical Laboratory Improvement Amendments CLIA as qualified to perform high complexity clinical laboratory testing.    CIPROFLOXACIN Value in next row Sensitive      2 SENSITIVEThis is a modified FDA-approved test that has been validated and its performance characteristics determined by the reporting laboratory.  This laboratory is certified under the Clinical Laboratory Improvement Amendments CLIA as qualified to perform high complexity clinical laboratory testing.    GENTAMICIN Value in next row Sensitive      2 SENSITIVEThis is a modified FDA-approved test that has been validated and its performance characteristics determined by the  reporting laboratory.  This laboratory is certified under the Clinical Laboratory Improvement Amendments CLIA as qualified to perform high complexity clinical laboratory testing.    NITROFURANTOIN Value in next row Intermediate      2 SENSITIVEThis is a modified FDA-approved test that has been validated and its performance characteristics determined by the reporting laboratory.  This laboratory is certified under the Clinical Laboratory Improvement Amendments CLIA as qualified to perform high complexity clinical laboratory testing.    TRIMETH/SULFA Value in next row Sensitive      2 SENSITIVEThis is a modified FDA-approved test that has been validated and its performance characteristics determined by the reporting laboratory.  This laboratory is certified under the Clinical Laboratory Improvement Amendments CLIA as qualified to perform high complexity clinical laboratory testing.    AMPICILLIN/SULBACTAM Value in next row Sensitive      2 SENSITIVEThis is a modified FDA-approved test that has been validated and its performance characteristics determined by the reporting laboratory.  This laboratory is certified under the Clinical Laboratory Improvement Amendments CLIA as qualified to perform high complexity clinical laboratory testing.    PIP/TAZO Value in next row Sensitive ug/mL     <=4 SENSITIVEThis is a modified FDA-approved test that has been validated and its performance characteristics determined by the reporting laboratory.  This laboratory is certified under the Clinical Laboratory Improvement Amendments CLIA as qualified to perform high complexity clinical laboratory testing.    MEROPENEM Value in next row Sensitive      <=4 SENSITIVEThis is a modified FDA-approved test that has been validated and its performance characteristics determined by the reporting laboratory.  This laboratory is certified under the Clinical Laboratory Improvement Amendments CLIA as qualified to perform high complexity  clinical laboratory testing.    * >=100,000 COLONIES/mL KLEBSIELLA PNEUMONIAE  Culture, blood (Routine X 2) w Reflex to ID Panel     Status: None   Collection Time: 08/18/24  3:45 PM   Specimen: BLOOD  Result Value Ref Range Status   Specimen Description BLOOD BLOOD RIGHT HAND  Final   Special Requests   Final    BOTTLES DRAWN AEROBIC AND ANAEROBIC Blood Culture results may not be optimal due to an inadequate volume of blood received in culture bottles   Culture   Final    NO GROWTH 5 DAYS Performed at [facility], [address], [place], [place] [address]    Report Status 08/23/2024 FINAL  Final  Culture, blood (Routine X 2) w Reflex to ID Panel     Status: None   Collection Time: 08/18/24  3:45 PM   Specimen: BLOOD  Result Value Ref Range Status   Specimen Description BLOOD BLOOD LEFT HAND  Final   Special Requests   Final    AEROBIC BOTTLE ONLY Blood Culture results may not be optimal due to an inadequate volume  of blood received in culture bottles   Culture   Final    NO GROWTH 5 DAYS Performed at [facility], [address] [place], [place] [address]    Report Status 08/23/2024 FINAL  Final         Radiology Studies: No results found.      Scheduled Meds:  amLODipine   10 mg Oral Daily   ascorbic acid   500 mg Oral Daily   aspirin  EC  81 mg Oral Daily   atorvastatin   80 mg Oral Daily   dapagliflozin  propanediol  10 mg Oral Daily   donepezil   10 mg Oral QHS   furosemide   20 mg Oral Daily   gabapentin   300 mg Oral TID   heparin   5,000 Units Subcutaneous Q8H   hydrochlorothiazide   12.5 mg Oral Daily   polyethylene glycol  17 g Oral Daily   traZODone   50 mg Oral QHS   Continuous Infusions:   LOS: 0 days       Calvin KATHEE Robson, MD [facility]   If 7PM-7AM, please contact night-coverage  08/24/2024, 2:54 PM

## 2024-08-24 NOTE — TOC Progression Note (Signed)
 Transition of Care [facility]) - Progression Note    Patient Details  Name: Shelby [name] MRN: [id number] Date of Birth: May 19, 1941  Transition of Care [facility]) CM/SW Contact  [name] Fuse, RN Phone Number: 08/24/2024, 10:24 AM  Clinical Narrative:    TOC received call back from [place] at [facility]. They don't have any beds available today, she will be first on the wait list for a bed tomorrow.   TOC will continue to follow.                     Expected Discharge Plan and Services         Expected Discharge Date: 08/23/24                                     Social Drivers of Health (SDOH) Interventions SDOH Screenings   Food Insecurity: No Food Insecurity (08/18/2024)  Housing: Unknown (08/18/2024)  Transportation Needs: No Transportation Needs (08/18/2024)  Utilities: Not At Risk (08/18/2024)  Alcohol Screen: Low Risk  (12/20/2018)  Financial Resource Strain: Low Risk  (03/01/2024)   Received from [facility]  Physical Activity: Inactive (03/19/2021)   Received from [facility]  Social Connections: Socially Isolated (08/18/2024)  Stress: No Stress Concern Present (03/19/2021)   Received from [facility]  Tobacco Use: Low Risk  (08/18/2024)    Readmission Risk Interventions     No data to display

## 2024-08-25 DIAGNOSIS — N3000 Acute cystitis without hematuria: Secondary | ICD-10-CM | POA: Diagnosis not present

## 2024-08-25 NOTE — Progress Notes (Signed)
 Patient left floor in care of [facility] personnel. Facility was called and report given to nurse Harlene. Family members aware of patietn tranfer.

## 2024-08-25 NOTE — TOC Progression Note (Signed)
 Transition of Care [facility]) - Progression Note    Patient Details  Name: Shelby [name] MRN: [id number] Date of Birth: Jul 12, 1941  [facility]) CM/SW Contact  [name] Fuse, RN Phone Number: 08/25/2024, 10:09 AM  Clinical Narrative:    TOC spoke with [place] at [facility]. They can admit the patient today. TOC sent secure chat to make the MD aware.      Barriers to Discharge: No SNF bed               Expected Discharge Plan and Services         Expected Discharge Date: 08/23/24                                     Social Drivers of Health (SDOH) Interventions SDOH Screenings   Food Insecurity: No Food Insecurity (08/18/2024)  Housing: Unknown (08/18/2024)  Transportation Needs: No Transportation Needs (08/18/2024)  Utilities: Not At Risk (08/18/2024)  Alcohol Screen: Low Risk  (12/20/2018)  Financial Resource Strain: Low Risk  (03/01/2024)   Received from [facility]  Physical Activity: Inactive (03/19/2021)   Received from [facility]  Social Connections: Socially Isolated (08/18/2024)  Stress: No Stress Concern Present (03/19/2021)   Received from [facility]  Tobacco Use: Low Risk  (08/18/2024)    Readmission Risk Interventions     No data to display

## 2024-08-25 NOTE — TOC Transition Note (Addendum)
 Transition of Care [facility]) - Discharge Note   Patient Details  Name: Shelby [name] MRN: [id number] Date of Birth: December 27, 1940  Transition of Care [facility]) CM/SW Contact:  [name] Fuse, RN Phone Number: 08/25/2024, 12:51 PM   Clinical Narrative:     Patient is medically clear to discharge to [facility] for STR. [name], Vinie, is agreeable with the discharge plan. [facility] to transport.   Nurse to call report to [facility] [phone]   Final next level of care: Skilled Nursing Facility Barriers to Discharge: Barriers Resolved   Patient Goals and CMS Choice            Discharge Placement              Patient chooses bed at: [facility] Patient to be transferred to facility by: [facility] Name of family member notified: Vinie Patient and family notified of of transfer: 08/25/24  Discharge Plan and Services Additional resources added to the After Visit Summary for                                       Social Drivers of Health (SDOH) Interventions SDOH Screenings   Food Insecurity: No Food Insecurity (08/18/2024)  Housing: Unknown (08/18/2024)  Transportation Needs: No Transportation Needs (08/18/2024)  Utilities: Not At Risk (08/18/2024)  Alcohol Screen: Low Risk  (12/20/2018)  Financial Resource Strain: Low Risk  (03/01/2024)   Received from [facility]  Physical Activity: Inactive (03/19/2021)   Received [facility]  Social Connections: Socially Isolated (08/18/2024)  Stress: No Stress Concern Present (03/19/2021)   Received from [facility]  Tobacco Use: Low Risk  (08/18/2024)     Readmission Risk Interventions     No data to display

## 2024-09-07 NOTE — Progress Notes (Signed)
 CC: Chief Complaint  Patient presents with  . Right Ankle - Follow-up    Follow up right ankle fracture.     Shelby [name] presents for follow up of distal fib.  She has been immobilized in a boot for the last month now.  No recent falls.  She did have some edema in her calf at her last visit.  Ultrasound was performed and this was negative..    Objective: Edema to the ankle.  She complains of pain along the lateral aspect of the ankle.  Some irritation and dried skin laterally.  No open wounds or fracture blisters.  Right ankle x-rays 3 views shows the fibular fracture.  Is a short oblique fracture.  Mild posterior displacement is noted.  No displacement laterally.  Tibiotalar joints well-maintained The following labs were personally reviewed by myself: Lab Results  Component Value Date   WBC 5.4 07/01/2023   HGB 12.9 07/01/2023   HCT 41.1 07/01/2023   PLT 175 07/01/2023   Lab Results  Component Value Date   NA 143 03/01/2024   K 4.7 03/01/2024   CL 108 03/01/2024   CO2 29.6 03/01/2024   BUN 24 03/01/2024   CREATININE 1.4 (H) 03/01/2024   CALCIUM  11.1 (H) 03/01/2024   TP 7.6 04/14/2006   ALB 3.8 03/01/2024   TBILI 0.6 03/01/2024   ALKPHOS 73 03/01/2024   AST 25 03/01/2024   ALT 25 03/01/2024   GLUCOSE 97 03/01/2024   GFR 38 (L) 03/01/2024   Lab Results  Component Value Date   HGBA1C 6.2 (H) 03/01/2024    Assessment: Encounter Diagnoses  Name Primary?  . Closed displaced fracture of lateral malleolus of right fibula with routine healing, subsequent encounter Yes  . 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 Osteopenia of right lower leg     Plan: I discussed the mild posterior displacement at this time.  We just need to continue to be careful and avoid any stress to this region.  Will continue to allow secondary bone healing for now.  I am holding on any type of surgery will need to let this heal up on its own.  We discussed the slow healing  with her diabetes and osteopenia.  I will see her back in a month.  Maintain nonweightbearing is much as possible with weightbearing only for transfers are in her boot.  Patient and family expressed understanding today..  Orders Placed This Encounter  Procedures  . X-ray ankle right 3 plus views    Standing Status:   Future    Number of Occurrences:   1    Expiration Date:   09/07/2025    This procedure is enabled for patient scheduling in MyChart. Please indicate if the patient should not be able to schedule an appointment for this procedure in MyChart.:   Allow MyChart Scheduling    Release to patient:   Immediate

## 2024-09-15 ENCOUNTER — Emergency Department

## 2024-09-15 ENCOUNTER — Inpatient Hospital Stay
Admission: EM | Admit: 2024-09-15 | Discharge: 2024-10-04 | DRG: 640 | Disposition: A | Discharge status: Home Health | Source: Skilled Nursing Facility | Attending: Student | Admitting: Student

## 2024-09-15 ENCOUNTER — Encounter: Payer: Self-pay | Admitting: Emergency Medicine

## 2024-09-15 ENCOUNTER — Other Ambulatory Visit: Payer: Self-pay

## 2024-09-15 DIAGNOSIS — N184 Chronic kidney disease, stage 4 (severe): Secondary | ICD-10-CM | POA: Diagnosis present

## 2024-09-15 DIAGNOSIS — E870 Hyperosmolality and hypernatremia: Secondary | ICD-10-CM | POA: Diagnosis present

## 2024-09-15 DIAGNOSIS — Z5971 Insufficient health insurance coverage: Secondary | ICD-10-CM

## 2024-09-15 DIAGNOSIS — Z87440 Personal history of urinary (tract) infections: Secondary | ICD-10-CM

## 2024-09-15 DIAGNOSIS — E1129 Type 2 diabetes mellitus with other diabetic kidney complication: Secondary | ICD-10-CM | POA: Diagnosis present

## 2024-09-15 DIAGNOSIS — G2581 Restless legs syndrome: Secondary | ICD-10-CM | POA: Diagnosis present

## 2024-09-15 DIAGNOSIS — I5032 Chronic diastolic (congestive) heart failure: Secondary | ICD-10-CM | POA: Diagnosis present

## 2024-09-15 DIAGNOSIS — Z604 Social exclusion and rejection: Secondary | ICD-10-CM | POA: Diagnosis present

## 2024-09-15 DIAGNOSIS — E43 Unspecified severe protein-calorie malnutrition: Secondary | ICD-10-CM | POA: Diagnosis present

## 2024-09-15 DIAGNOSIS — E11649 Type 2 diabetes mellitus with hypoglycemia without coma: Secondary | ICD-10-CM | POA: Diagnosis not present

## 2024-09-15 DIAGNOSIS — E872 Acidosis, unspecified: Secondary | ICD-10-CM | POA: Diagnosis present

## 2024-09-15 DIAGNOSIS — E1122 Type 2 diabetes mellitus with diabetic chronic kidney disease: Secondary | ICD-10-CM | POA: Diagnosis present

## 2024-09-15 DIAGNOSIS — G4733 Obstructive sleep apnea (adult) (pediatric): Secondary | ICD-10-CM | POA: Diagnosis not present

## 2024-09-15 DIAGNOSIS — E860 Dehydration: Secondary | ICD-10-CM | POA: Diagnosis present

## 2024-09-15 DIAGNOSIS — G9340 Encephalopathy, unspecified: Secondary | ICD-10-CM | POA: Diagnosis not present

## 2024-09-15 DIAGNOSIS — Z6830 Body mass index (BMI) 30.0-30.9, adult: Secondary | ICD-10-CM | POA: Diagnosis not present

## 2024-09-15 DIAGNOSIS — E1142 Type 2 diabetes mellitus with diabetic polyneuropathy: Secondary | ICD-10-CM | POA: Diagnosis present

## 2024-09-15 DIAGNOSIS — N2581 Secondary hyperparathyroidism of renal origin: Secondary | ICD-10-CM | POA: Diagnosis present

## 2024-09-15 DIAGNOSIS — E1159 Type 2 diabetes mellitus with other circulatory complications: Secondary | ICD-10-CM | POA: Diagnosis present

## 2024-09-15 DIAGNOSIS — L89150 Pressure ulcer of sacral region, unstageable: Secondary | ICD-10-CM | POA: Diagnosis not present

## 2024-09-15 DIAGNOSIS — I420 Dilated cardiomyopathy: Secondary | ICD-10-CM | POA: Diagnosis present

## 2024-09-15 DIAGNOSIS — N1832 Chronic kidney disease, stage 3b: Secondary | ICD-10-CM | POA: Diagnosis present

## 2024-09-15 DIAGNOSIS — G9341 Metabolic encephalopathy: Secondary | ICD-10-CM | POA: Diagnosis present

## 2024-09-15 DIAGNOSIS — I130 Hypertensive heart and chronic kidney disease with heart failure and stage 1 through stage 4 chronic kidney disease, or unspecified chronic kidney disease: Secondary | ICD-10-CM | POA: Diagnosis present

## 2024-09-15 DIAGNOSIS — F0390 Unspecified dementia without behavioral disturbance: Secondary | ICD-10-CM | POA: Diagnosis present

## 2024-09-15 DIAGNOSIS — N2889 Other specified disorders of kidney and ureter: Secondary | ICD-10-CM | POA: Diagnosis present

## 2024-09-15 DIAGNOSIS — Z66 Do not resuscitate: Secondary | ICD-10-CM | POA: Diagnosis not present

## 2024-09-15 DIAGNOSIS — E876 Hypokalemia: Secondary | ICD-10-CM | POA: Diagnosis present

## 2024-09-15 DIAGNOSIS — E871 Hypo-osmolality and hyponatremia: Secondary | ICD-10-CM | POA: Diagnosis present

## 2024-09-15 DIAGNOSIS — Z515 Encounter for palliative care: Secondary | ICD-10-CM | POA: Diagnosis not present

## 2024-09-15 DIAGNOSIS — E66812 Obesity, class 2: Secondary | ICD-10-CM | POA: Diagnosis present

## 2024-09-15 DIAGNOSIS — L89152 Pressure ulcer of sacral region, stage 2: Secondary | ICD-10-CM | POA: Diagnosis present

## 2024-09-15 DIAGNOSIS — Z833 Family history of diabetes mellitus: Secondary | ICD-10-CM

## 2024-09-15 DIAGNOSIS — K649 Unspecified hemorrhoids: Secondary | ICD-10-CM | POA: Diagnosis present

## 2024-09-15 DIAGNOSIS — Z7982 Long term (current) use of aspirin: Secondary | ICD-10-CM

## 2024-09-15 DIAGNOSIS — Z888 Allergy status to other drugs, medicaments and biological substances status: Secondary | ICD-10-CM

## 2024-09-15 DIAGNOSIS — Z79899 Other long term (current) drug therapy: Secondary | ICD-10-CM

## 2024-09-15 DIAGNOSIS — N179 Acute kidney failure, unspecified: Secondary | ICD-10-CM | POA: Diagnosis present

## 2024-09-15 DIAGNOSIS — Z789 Other specified health status: Secondary | ICD-10-CM | POA: Diagnosis not present

## 2024-09-15 DIAGNOSIS — Z6836 Body mass index (BMI) 36.0-36.9, adult: Secondary | ICD-10-CM

## 2024-09-15 DIAGNOSIS — Z8249 Family history of ischemic heart disease and other diseases of the circulatory system: Secondary | ICD-10-CM

## 2024-09-15 DIAGNOSIS — I152 Hypertension secondary to endocrine disorders: Secondary | ICD-10-CM | POA: Diagnosis not present

## 2024-09-15 DIAGNOSIS — Z90710 Acquired absence of both cervix and uterus: Secondary | ICD-10-CM

## 2024-09-15 DIAGNOSIS — E785 Hyperlipidemia, unspecified: Secondary | ICD-10-CM | POA: Diagnosis present

## 2024-09-15 DIAGNOSIS — Z7189 Other specified counseling: Secondary | ICD-10-CM | POA: Diagnosis not present

## 2024-09-15 DIAGNOSIS — D631 Anemia in chronic kidney disease: Secondary | ICD-10-CM | POA: Diagnosis present

## 2024-09-15 LAB — PHOSPHORUS: Phosphorus: 1 mg/dL — CL (ref 2.5–4.6)

## 2024-09-15 LAB — COMPREHENSIVE METABOLIC PANEL WITH GFR
ALT: 26 U/L (ref 0–44)
AST: 37 U/L (ref 15–41)
Albumin: 3.4 g/dL — ABNORMAL LOW (ref 3.5–5.0)
Alkaline Phosphatase: 59 U/L (ref 38–126)
Anion gap: 8 (ref 5–15)
BUN: 44 mg/dL — ABNORMAL HIGH (ref 8–23)
CO2: 36 mmol/L — ABNORMAL HIGH (ref 22–32)
Calcium: 14.2 mg/dL (ref 8.9–10.3)
Chloride: 106 mmol/L (ref 98–111)
Creatinine, Ser: 1.87 mg/dL — ABNORMAL HIGH (ref 0.44–1.00)
GFR, Estimated: 27 mL/min — ABNORMAL LOW (ref >60)
Glucose, Bld: 121 mg/dL — ABNORMAL HIGH (ref 70–99)
Potassium: 4.1 mmol/L (ref 3.5–5.1)
Sodium: 150 mmol/L — ABNORMAL HIGH (ref 135–145)
Total Bilirubin: 0.7 mg/dL (ref 0.0–1.2)
Total Protein: 7.3 g/dL (ref 6.5–8.1)

## 2024-09-15 LAB — CBC
HCT: 44.5 % (ref 36.0–46.0)
Hemoglobin: 13.5 g/dL (ref 12.0–15.0)
MCH: 29.9 pg (ref 26.0–34.0)
MCHC: 30.3 g/dL (ref 30.0–36.0)
MCV: 98.7 fL (ref 80.0–100.0)
Platelets: 199 K/uL (ref 150–400)
RBC: 4.51 MIL/uL (ref 3.87–5.11)
RDW: 15.9 % — ABNORMAL HIGH (ref 11.5–15.5)
WBC: 8 K/uL (ref 4.0–10.5)
nRBC: 0 % (ref 0.0–0.2)

## 2024-09-15 LAB — GLUCOSE, CAPILLARY: Glucose-Capillary: 133 mg/dL — ABNORMAL HIGH (ref 70–99)

## 2024-09-15 MED ORDER — INSULIN ASPART 100 UNIT/ML IJ SOLN
0.0000 [IU] | Freq: Three times a day (TID) | INTRAMUSCULAR | Status: DC
  Administered 2024-09-16 (×2): 2 [IU] via SUBCUTANEOUS
  Administered 2024-09-17: 3 [IU] via SUBCUTANEOUS
  Administered 2024-09-18 – 2024-09-19 (×2): 2 [IU] via SUBCUTANEOUS
  Filled 2024-09-15 (×6): qty 1

## 2024-09-15 MED ORDER — HEPARIN SODIUM (PORCINE) 5000 UNIT/ML IJ SOLN
5000.0000 [IU] | Freq: Three times a day (TID) | INTRAMUSCULAR | Status: DC
Start: 2024-09-15 — End: 2024-10-02
  Administered 2024-09-15 – 2024-10-01 (×39): 5000 [IU] via SUBCUTANEOUS
  Filled 2024-09-15 (×42): qty 1

## 2024-09-15 MED ORDER — ACETAMINOPHEN 325 MG PO TABS
650.0000 mg | ORAL_TABLET | Freq: Four times a day (QID) | ORAL | Status: DC | PRN
  Administered 2024-09-24: 650 mg via ORAL
  Filled 2024-09-15: qty 2

## 2024-09-15 MED ORDER — ACETAMINOPHEN 650 MG RE SUPP: 650.0000 mg | Freq: Four times a day (QID) | RECTAL | Status: DC | PRN

## 2024-09-15 MED ORDER — INSULIN ASPART 100 UNIT/ML IJ SOLN: 0.0000 [IU] | Freq: Every day | INTRAMUSCULAR | Status: DC

## 2024-09-15 MED ORDER — ONDANSETRON HCL 4 MG/2ML IJ SOLN: 4.0000 mg | Freq: Four times a day (QID) | INTRAMUSCULAR | Status: DC | PRN

## 2024-09-15 MED ORDER — ONDANSETRON HCL 4 MG PO TABS: 4.0000 mg | ORAL_TABLET | Freq: Four times a day (QID) | ORAL | Status: DC | PRN

## 2024-09-15 MED ORDER — HYDROCODONE-ACETAMINOPHEN 5-325 MG PO TABS
1.0000 | ORAL_TABLET | ORAL | Status: DC | PRN
  Administered 2024-09-18 – 2024-09-19 (×2): 1 via ORAL
  Filled 2024-09-15 (×2): qty 1

## 2024-09-15 MED ORDER — KCL IN DEXTROSE-NACL 10-5-0.45 MEQ/L-%-% IV SOLN
INTRAVENOUS | Status: AC
  Filled 2024-09-15 (×7): qty 1000

## 2024-09-15 MED ORDER — CINACALCET HCL 30 MG PO TABS
30.0000 mg | ORAL_TABLET | Freq: Every day | ORAL | Status: DC
  Administered 2024-09-16: 30 mg via ORAL
  Filled 2024-09-15: qty 1

## 2024-09-15 NOTE — ED Triage Notes (Signed)
 Pt reports feeling sick for 7 days. Pt denies any pain. Pt admitted last month for weakness and UTI.

## 2024-09-15 NOTE — ED Triage Notes (Signed)
 Pt comes via [facility] from [facility] and family wanted pt to be che ked out. Pt has dx of UTI, pneumonia and CHF excaberation. Pt has been on meds. Pt has been more out of it per family  Pt denies any complaints. 114/72 99% 2L [place] at home chronically. CBG 160

## 2024-09-15 NOTE — ED Notes (Signed)
 MD made aware of patient not using the bathroom. MD stated to give patient more time.

## 2024-09-15 NOTE — ED Notes (Addendum)
 Calcium  14.2 MD [place] informed, [facility] changed

## 2024-09-15 NOTE — H&P (Addendum)
 History and Physical    Patient: Shelby [name] [name] DOB: Nov 02, 1941 DOA: 09/15/2024 DOS: the patient was seen and examined on 09/15/2024 PCP: Steva Clotilda DEL, NP  Patient coming from: SNF  Chief Complaint:  Chief Complaint  Patient presents with   Altered Mental Status   Weakness   HPI: Shelby [name] is a 83 y.o. female with medical history significant of Hyperparathyroidism, dementia, heart failure with reduced ejection fraction, anemia of chronic disease, dilated cardiomyopathy, type 2 diabetes, essential hypertension, hyperlipidemia, obstructive sleep apnea, peripheral neuropathy, chronic kidney disease stage III, history of seizure-like activity and urinary incontinence who was brought in from [facility] by family wanting her to be checked.  Patient was recently diagnosed with UTI pneumonia and CHF exacerbation.  She has been taking medications at the facility.  Family visited and noticed she is more confused.  Patient is currently unable to give history.  She also altered.  She responds to voice by opening her eyes.  Workup performed showed significant change from last month.  Her sodium is 150, calcium  14.2 and phosphorus only 1.  Patient also has known history of chronic kidney disease creatinine is 1.87.  Head CT without contrast showed no acute findings.  Nephrology consulted and recommends admission for treatment of hypercalcemia and hyponatremia.  Review of Systems: As mentioned in the history of present illness. All other systems reviewed and are negative. Past Medical History:  Diagnosis Date   Anemia    Cardiomyopathy (HCC)    Ejection Fraction 30-35% per ECHO 2016   CHF (congestive heart failure) (HCC)    Diabetes (HCC)    Dyspnea    Dysrhythmia    Hyperlipidemia    Hypertension    Peripheral neuropathy    Peripheral neuropathy    Sleep apnea    Past Surgical History:  Procedure Laterality Date   ABDOMINAL HYSTERECTOMY      COLONOSCOPY     COLONOSCOPY N/A 04/01/2018   Procedure: COLONOSCOPY;  Surgeon: Gaylyn Gladis PENNER, MD;  Location: [facility] ENDOSCOPY;  Service: Endoscopy;  Laterality: N/A;   COLONOSCOPY WITH PROPOFOL  N/A 12/29/2017   Procedure: COLONOSCOPY WITH PROPOFOL ;  Surgeon: Gaylyn Gladis PENNER, MD;  Location: [facility] ENDOSCOPY;  Service: Endoscopy;  Laterality: N/A;   ESOPHAGOGASTRODUODENOSCOPY (EGD) WITH PROPOFOL  N/A 12/29/2017   Procedure: ESOPHAGOGASTRODUODENOSCOPY (EGD) WITH PROPOFOL ;  Surgeon: Gaylyn Gladis PENNER, MD;  Location: [facility] ENDOSCOPY;  Service: Endoscopy;  Laterality: N/A;   ESOPHAGOGASTRODUODENOSCOPY (EGD) WITH PROPOFOL  N/A 04/01/2018   Procedure: ESOPHAGOGASTRODUODENOSCOPY (EGD) WITH PROPOFOL ;  Surgeon: Gaylyn Gladis PENNER, MD;  Location: [facility] ENDOSCOPY;  Service: Endoscopy;  Laterality: N/A;   EUS N/A 05/20/2018   Procedure: ESOPHAGEAL ENDOSCOPIC ULTRASOUND (EUS) RADIAL;  Surgeon: Queenie Asberry LABOR, MD;  Location: [facility] ENDOSCOPY;  Service: Gastroenterology;  Laterality: N/A;   Social History:  reports that she has never smoked. She has never used smokeless tobacco. She reports that she does not drink alcohol and does not use drugs.  Allergies  Allergen Reactions   Furosemide  Other (See Comments)    Legs swelling     Family History  Problem Relation Age of Onset   Diabetes Mother    Heart disease Mother    Bladder Cancer Neg Hx    Kidney cancer Neg Hx     Prior to Admission medications   Medication Sig Start Date End Date Taking? Authorizing Provider  acetaminophen  (TYLENOL ) 325 MG tablet Take 2 tablets (650 mg total) by mouth every 6 (six) hours as needed for mild pain (pain  score 1-3) (or Fever >/= 100.4). 08/23/24  Yes Lenon Marien CROME, MD  amLODipine  (NORVASC ) 10 MG tablet Take 10 mg by mouth daily.   Yes [provider]  ascorbic acid  (VITAMIN C ) 500 MG tablet Take 500 mg by mouth daily.   Yes [provider]  aspirin  EC 81 MG tablet Take 81 mg by mouth daily.    Yes [provider]  atorvastatin  (LIPITOR) 80 MG tablet Take 80 mg by mouth daily.   Yes [provider]  carvedilol  (COREG ) 25 MG tablet Take 25 mg by mouth 2 (two) times daily with a meal.   Yes [provider]  cinacalcet (SENSIPAR) 30 MG tablet Take 30 mg by mouth daily with breakfast. 09/07/24 12/06/24 Yes [provider]  dapagliflozin  propanediol (FARXIGA ) 10 MG TABS tablet Take 1 tablet by mouth daily. 03/01/24  Yes [provider]  donepezil  (ARICEPT ) 10 MG tablet Take 10 mg by mouth at bedtime.   Yes [provider]  ferrous sulfate  325 (65 FE) MG tablet Take by mouth. Takes one pill every 2-3 days to minimize constipation   Yes [provider]  gabapentin  (NEURONTIN ) 300 MG capsule Take 300 mg by mouth 3 (three) times daily.   Yes [provider]  hydrochlorothiazide  (MICROZIDE ) 12.5 MG capsule Take 12.5 mg by mouth daily. 03/01/24  Yes [provider]  magnesium  oxide (MAG-OX) 400 MG tablet Take 400 mg by mouth daily.    Yes [provider]  nystatin  (MYCOSTATIN /NYSTOP ) powder Apply topically as needed (Apply to Groin; Skin folds as needed). 08/23/24  Yes Lenon Marien CROME, MD  torsemide  (DEMADEX ) 20 MG tablet Take 20 mg by mouth daily.   Yes [provider]  travoprost , benzalkonium, (TRAVATAN ) 0.004 % ophthalmic solution Place 1 drop into both eyes at bedtime.    Yes [provider]  traZODone  (DESYREL ) 50 MG tablet Take 50 mg by mouth at bedtime.   Yes [provider]    Physical Exam: Vitals:   09/15/24 1730 09/15/24 1800 09/15/24 1830 09/15/24 1903  BP: 112/79 (!) 125/92 (!) 146/122   Pulse: 81 84 83   Resp: 11 12 17    Temp:    98.1 F (36.7 C)  TempSrc:    Oral  SpO2: 90% 100% 93%    Constitutional: Obtunded, unresponsive, NAD, calm, comfortable Eyes: PERRL, lids and conjunctivae normal ENMT: Mucous membranes are dry. Posterior pharynx clear of any exudate  or lesions.Normal dentition.  Neck: normal, supple, no masses, no thyromegaly Respiratory: clear to auscultation bilaterally, no wheezing, no crackles. Normal respiratory effort. No accessory muscle use.  Cardiovascular: Sinus tachycardia, no murmurs / rubs / gallops. No extremity edema. 2+ pedal pulses. No carotid bruits.  Abdomen: no tenderness, no masses palpated. No hepatosplenomegaly. Bowel sounds positive.  Musculoskeletal: Good range of motion, no joint swelling or tenderness, Skin: no rashes, lesions, ulcers. No induration Neurologic: CN 2-12 grossly intact. Sensation intact, DTR normal. Strength 5/5 in all 4.  Psychiatric: Confused  Data Reviewed:  Temperature 98.3 blood pressure 152/134, pulse 85, oxygen  sat 87% on room air, white count 8.0 hemoglobin 13.5.  Sodium 150 potassium 4.1.  BUN 44 creatinine 1.87 CO2 37 calcium  14.2.  Glucose 121. Phosphorus is 1.0 albumin  3.4.  Head CT without contrast showed no acute findings and stable atrophy and chronic small vessel ischemia.  Assessment and Plan:  #1 acute metabolic encephalopathy: Most likely multifactorial.  Hypercalcemia hyponatremia and possible UTI.  Patient unable to give adequate urine  in the morning to check for UA.  Patient will be admitted for management of hypercalcemia.  She is on Sensipar at home for known history of hyperparathyroidsm.  We will continue the Sensipar and aggressive hydration.  Nephrology consulted and expected to follow up.  Will follow their recommendations.  #2 hypercalcemia: As per above.  Aggressive hydration.  Continue supportive care.  Follow calcium  level closely.  Nephrology to follow and recommend further treatment including possible bisphosphonates  #3 hypernatremia: Most likely due to dehydration.  Aggressively hydrate and monitor  #4 type 2 diabetes: Initiate sliding scale insulin .  #5 essential hypertension: Continue blood pressure monitoring.  At this point blood pressure appears  controlled.  #6 acute on chronic kidney disease stage IIIb: Renal function appears to be at baseline.  Continue to monitor  #7 hyperlipidemia: Will resume statin when stable.  #8 obstructive sleep apnea: Consider CPAP at night.  #9 morbid obesity: Continue to follow dietary recommendations.  #10 dilated cardiomyopathy: Appears compensated.  #11 dementia: Currently obtunded.     Advance Care Planning:   Code Status: Full Code   Consults: Nephrology Dr. Marcelino  Family Communication: No family at bedside  Severity of Illness: The appropriate patient status for this patient is INPATIENT. Inpatient status is judged to be reasonable and necessary in order to provide the required intensity of service to ensure the patient's safety. The patient's presenting symptoms, physical exam findings, and initial radiographic and laboratory data in the context of their chronic comorbidities is felt to place them at high risk for further clinical deterioration. Furthermore, it is not anticipated that the patient will be medically stable for discharge from the hospital within 2 midnights of admission.   * I certify that at the point of admission it is my clinical judgment that the patient will require inpatient hospital care spanning beyond 2 midnights from the point of admission due to high intensity of service, high risk for further deterioration and high frequency of surveillance required.*  AuthorBETHA SIM KNOLL, MD 09/15/2024 8:34 PM  For on call review [email or web address].

## 2024-09-15 NOTE — ED Provider Notes (Signed)
 [facility] Provider Note    Event Date/Time   First MD Initiated Contact with Patient 09/15/24 1540     (approximate)   History   Altered Mental Status and Weakness   HPI  Shelby [name] is a 83 y.o. female  with a PMH significant for HFrEF, anemia, dilated cardiomyopathy, type 2 diabetes, HTN, HLD, OSA, peripheral neuropathy, CKD 3, obesity, dementia, hyperparathyroidism, osteopenia, open-angle glaucoma, thrombocytopenia, restless leg syndrome, history of seizure-like activity, urinary incontinence who presents from [facility] for worsening confusion.  Patient was recently treated for UTI but is not on any antibiotics currently.  Patient denies any complaints but does not answer every question appropriately      Physical Exam   Triage Vital Signs: ED Triage Vitals  Encounter Vitals Group     BP 09/15/24 1436 111/88     Girls Systolic BP Percentile --      Girls Diastolic BP Percentile --      Boys Systolic BP Percentile --      Boys Diastolic BP Percentile --      Pulse Rate 09/15/24 1436 85     Resp 09/15/24 1436 18     Temp 09/15/24 1436 98.3 F (36.8 C)     Temp Source 09/15/24 1436 Oral     SpO2 09/15/24 1436 92 %     Weight --      Height --      Head Circumference --      Peak Flow --      Pain Score 09/15/24 1437 0     Pain Loc --      Pain Education --      Exclude from Growth Chart --     Most recent vital signs: Vitals:   09/15/24 1903 09/15/24 2157  BP:  119/77  Pulse:  76  Resp:  16  Temp: 98.1 F (36.7 C) (!) 97.5 F (36.4 C)  SpO2:  91%    Nursing Triage Note reviewed. Vital signs reviewed and patients oxygen  saturation is normoxic  General: Patient is well nourished, well developed, awake and alert, appears chronically ill Head: Normocephalic and atraumatic Eyes: Normal inspection, extraocular muscles intact, no conjunctival pallor Ear, nose, throat: Normal external exam Neck: Normal range of  motion Respiratory: Patient is in no respiratory distress, lungs CTAB Cardiovascular: Patient is not tachycardic, RR GI: Abd SNT with no guarding or rebound  Back: Normal inspection of the back with good strength and range of motion throughout all ext Extremities: pulses intact with good cap refills, no LE pitting edema or calf tenderness Neuro: The patient is alert and oriented to person, place, and time, appropriately conversive, with 5/5 bilat UE/LE strength, no gross motor or sensory defects noted. Coordination appears to be adequate. Skin: Warm, dry, and intact Psych: normal mood and affect, no SI or HI  ED Results / Procedures / Treatments   Labs (all labs ordered are listed, but only abnormal results are displayed) Labs Reviewed  COMPREHENSIVE METABOLIC PANEL WITH GFR - Abnormal; Notable for the following components:      Result Value   Sodium 150 (*)    CO2 36 (*)    Glucose, Bld 121 (*)    BUN 44 (*)    Creatinine, Ser 1.87 (*)    Calcium  14.2 (*)    Albumin  3.4 (*)    GFR, Estimated 27 (*)    All other components within normal limits  CBC - Abnormal; Notable for  the following components:   RDW 15.9 (*)    All other components within normal limits  PHOSPHORUS - Abnormal; Notable for the following components:   Phosphorus 1.0 (*)    All other components within normal limits  GLUCOSE, CAPILLARY - Abnormal; Notable for the following components:   Glucose-Capillary 133 (*)    All other components within normal limits  URINALYSIS, ROUTINE W REFLEX MICROSCOPIC  PARATHYROID HORMONE, INTACT (NO CA)  COMPREHENSIVE METABOLIC PANEL WITH GFR  CBC     EKG EKG and rhythm strip are interpreted by myself:   EKG: [Normal sinus rhythm] at heart rate of 81, normal QRS duration, QTc 392, nonspecific ST segments and T waves no ectopy EKG not consistent with Acute STEMI Rhythm strip: NSR in lead II   RADIOLOGY CT head: No intracranial hemorrhage on my independent review  interpretation    PROCEDURES:  Critical Care performed: No  Procedures   MEDICATIONS ORDERED IN ED: Medications  dextrose 5 % and 0.45 % NaCl with KCl 10 mEq/L infusion ( Intravenous New Bag/Given 09/15/24 1621)  cinacalcet (SENSIPAR) tablet 30 mg (has no administration in time range)  insulin  aspart (novoLOG ) injection 0-15 Units (has no administration in time range)  insulin  aspart (novoLOG ) injection 0-5 Units (has no administration in time range)  heparin  injection 5,000 Units (has no administration in time range)  ondansetron  (ZOFRAN ) tablet 4 mg (has no administration in time range)    Or  ondansetron  (ZOFRAN ) injection 4 mg (has no administration in time range)  acetaminophen  (TYLENOL ) tablet 650 mg (has no administration in time range)    Or  acetaminophen  (TYLENOL ) suppository 650 mg (has no administration in time range)  HYDROcodone-acetaminophen  (NORCO/VICODIN) 5-325 MG per tablet 1-2 tablet (has no administration in time range)     IMPRESSION / MDM / ASSESSMENT AND PLAN / ED COURSE                                Differential diagnosis includes, but is not limited to: Electrolyte derangement anemia, intracranial hemorrhage, acute renal insufficiency, UTI  ED course: Patient without any focal neurological deficits but does appear encephalopathic.  Calcium  did return acutely elevated.  She is hypernatremic as well.  Case briefly discussed with Dr. Marcelino and plan for D5 half-normal saline at 125 cc/h.  CT head unremarkable.  Case discussed with hospitalist for admission   Clinical Course as of 09/15/24 2359  Thu Sep 15, 2024  1542 Calcium (!!): 14.2 Very elevated [HD]  1542 Sodium(!): 150 Elevated [HD]  1600 WBC: 8.0 No leukocytosis [HD]  1600 Reaching out to nephrology via epic chat regarding type of fluid to resuscitate [HD]  1601 Dr. Marcelino suggests: D5 1/2 NS will be fine at a rate of 125cc/hr  [HD]  1621 Patient bladder scanned for only 150 cc of fluid  [HD]  1943 Case discussed with hospitalist for admission [HD]    Clinical Course User Index [HD] Nicholaus Rolland BRAVO, MD   -- Risk: 5 This patient has a high risk of morbidity due to further diagnostic testing or treatment. Rationale: This patient's evaluation and management involve a high risk of morbidity due to the potential severity of presenting symptoms, need for diagnostic testing, and/or initiation of treatment that may require close monitoring. The differential includes conditions with potential for significant deterioration or requiring escalation of care. Treatment decisions in the ED, including medication administration, procedural interventions, or disposition planning, reflect  this level of risk. COPA: 5 The patient has the following acute or chronic illness/injury that poses a possible threat to life or bodily function: [X] : The patient has a potentially serious acute condition or an acute exacerbation of a chronic illness requiring urgent evaluation and management in the Emergency Department. The clinical presentation necessitates immediate consideration of life-threatening or function-threatening diagnoses, even if they are ultimately ruled out.   FINAL CLINICAL IMPRESSION(S) / ED DIAGNOSES   Final diagnoses:  Hypercalcemia  Low phosphate levels  Acute encephalopathy     Rx / DC Orders   ED Discharge Orders     None        Note:  This document was prepared using Dragon voice recognition software and may include unintentional dictation errors.   Nicholaus Rolland BRAVO, MD 09/15/24 [phone]

## 2024-09-16 LAB — BASIC METABOLIC PANEL WITH GFR
Anion gap: 13 (ref 5–15)
Anion gap: 10 (ref 5–15)
BUN: 39 mg/dL — ABNORMAL HIGH (ref 8–23)
BUN: 40 mg/dL — ABNORMAL HIGH (ref 8–23)
CO2: 29 mmol/L (ref 22–32)
CO2: 32 mmol/L (ref 22–32)
Calcium: 12.8 mg/dL — ABNORMAL HIGH (ref 8.9–10.3)
Calcium: 12.6 mg/dL — ABNORMAL HIGH (ref 8.9–10.3)
Chloride: 106 mmol/L (ref 98–111)
Chloride: 105 mmol/L (ref 98–111)
Creatinine, Ser: 1.47 mg/dL — ABNORMAL HIGH (ref 0.44–1.00)
Creatinine, Ser: 1.56 mg/dL — ABNORMAL HIGH (ref 0.44–1.00)
GFR, Estimated: 36 mL/min — ABNORMAL LOW (ref >60)
GFR, Estimated: 33 mL/min — ABNORMAL LOW (ref >60)
Glucose, Bld: 155 mg/dL — ABNORMAL HIGH (ref 70–99)
Glucose, Bld: 150 mg/dL — ABNORMAL HIGH (ref 70–99)
Potassium: 3.9 mmol/L (ref 3.5–5.1)
Potassium: 3.4 mmol/L — ABNORMAL LOW (ref 3.5–5.1)
Sodium: 148 mmol/L — ABNORMAL HIGH (ref 135–145)
Sodium: 147 mmol/L — ABNORMAL HIGH (ref 135–145)

## 2024-09-16 LAB — COMPREHENSIVE METABOLIC PANEL WITH GFR
ALT: 23 U/L (ref 0–44)
AST: 37 U/L (ref 15–41)
Albumin: 3.2 g/dL — ABNORMAL LOW (ref 3.5–5.0)
Alkaline Phosphatase: 61 U/L (ref 38–126)
Anion gap: 14 (ref 5–15)
BUN: 46 mg/dL — ABNORMAL HIGH (ref 8–23)
CO2: 32 mmol/L (ref 22–32)
Calcium: 13.5 mg/dL (ref 8.9–10.3)
Chloride: 104 mmol/L (ref 98–111)
Creatinine, Ser: 1.65 mg/dL — ABNORMAL HIGH (ref 0.44–1.00)
GFR, Estimated: 31 mL/min — ABNORMAL LOW (ref >60)
Glucose, Bld: 157 mg/dL — ABNORMAL HIGH (ref 70–99)
Potassium: 3.7 mmol/L (ref 3.5–5.1)
Sodium: 150 mmol/L — ABNORMAL HIGH (ref 135–145)
Total Bilirubin: 0.6 mg/dL (ref 0.0–1.2)
Total Protein: 6.8 g/dL (ref 6.5–8.1)

## 2024-09-16 LAB — PHOSPHORUS: Phosphorus: 1.5 mg/dL — ABNORMAL LOW (ref 2.5–4.6)

## 2024-09-16 LAB — URINALYSIS, ROUTINE W REFLEX MICROSCOPIC
Bacteria, UA: NONE SEEN
Bilirubin Urine: NEGATIVE
Glucose, UA: >=500 mg/dL — AB
Hgb urine dipstick: NEGATIVE
Ketones, ur: NEGATIVE mg/dL
Leukocytes,Ua: NEGATIVE
Nitrite: NEGATIVE
Protein, ur: NEGATIVE mg/dL
RBC / HPF: 0 RBC/hpf (ref 0–5)
Specific Gravity, Urine: 1.021 (ref 1.005–1.030)
pH: 6 (ref 5.0–8.0)

## 2024-09-16 LAB — CBC
HCT: 44.7 % (ref 36.0–46.0)
Hemoglobin: 13.9 g/dL (ref 12.0–15.0)
MCH: 30.3 pg (ref 26.0–34.0)
MCHC: 31.1 g/dL (ref 30.0–36.0)
MCV: 97.4 fL (ref 80.0–100.0)
Platelets: 156 K/uL (ref 150–400)
RBC: 4.59 MIL/uL (ref 3.87–5.11)
RDW: 15.9 % — ABNORMAL HIGH (ref 11.5–15.5)
WBC: 6.7 K/uL (ref 4.0–10.5)
nRBC: 0 % (ref 0.0–0.2)

## 2024-09-16 LAB — GLUCOSE, CAPILLARY
Glucose-Capillary: 145 mg/dL — ABNORMAL HIGH (ref 70–99)
Glucose-Capillary: 95 mg/dL (ref 70–99)
Glucose-Capillary: 126 mg/dL — ABNORMAL HIGH (ref 70–99)
Glucose-Capillary: 150 mg/dL — ABNORMAL HIGH (ref 70–99)

## 2024-09-16 LAB — MAGNESIUM: Magnesium: 2.3 mg/dL (ref 1.7–2.4)

## 2024-09-16 LAB — PARATHYROID HORMONE, INTACT (NO CA): PTH: 107 pg/mL — ABNORMAL HIGH (ref 15–65)

## 2024-09-16 MED ORDER — CINACALCET HCL 30 MG PO TABS
30.0000 mg | ORAL_TABLET | Freq: Every day | ORAL | Status: DC
Start: 2024-09-17 — End: 2024-09-18
  Administered 2024-09-17 – 2024-09-18 (×2): 30 mg via ORAL
  Filled 2024-09-16 (×2): qty 1

## 2024-09-16 MED ORDER — SODIUM CHLORIDE 0.9 % IV SOLN
90.0000 mg | Freq: Once | INTRAVENOUS | Status: AC
  Administered 2024-09-16: 90 mg via INTRAVENOUS
  Filled 2024-09-16: qty 10

## 2024-09-16 MED ORDER — POTASSIUM PHOSPHATES 15 MMOLE/5ML IV SOLN
30.0000 mmol | Freq: Once | INTRAVENOUS | Status: AC
  Administered 2024-09-16: 30 mmol via INTRAVENOUS
  Filled 2024-09-16: qty 10

## 2024-09-16 MED ORDER — SODIUM CHLORIDE 0.9 % IV SOLN
90.0000 mg | Freq: Once | INTRAVENOUS | Status: DC
  Filled 2024-09-16: qty 10

## 2024-09-16 NOTE — Evaluation (Signed)
 Physical Therapy Evaluation Patient Details Name: Shelby [name] MRN: [id number] DOB: Jan 17, 1941 Today's Date: 09/16/2024  History of Present Illness  Pt is an 83 y/o F admitted on 09/15/24 after being brought in by family with reports of more confusion, recently diagnosed with UTI, PNA, & CHF exacerbation. Pt is being treated for acute metabolic encephalopathy, hypercalcemia. Of note, pt with recent admission (08/2024) for L distal fibula fx, closed nondisplaced fx of R lateral malleolus. PMH: hyperparathyroidism, dementia, heart failure with reduced EF, anemia of chronic disease, dilated cardiomyopathy, DM2, HTN, HLD, OSA, peripheral neuropathy, CKD 3, seizure like activity, urinary incontinence  Clinical Impression  Pt seen for PT evaluation with received asleep, awakened with lights turned on, verbal communication, but pt only briefly opens eyes, requires max encouragement to participate, limited verbal communication during session. Pt requires max/total assist for supine<>sit, demonstrates poor static sitting balance with decreased awareness. Pt does c/o unrated back pain during session. Pt notes she can feed herself but when set up with breakfast tray at end of session pt falling back asleep. Will continue to follow pt acutely to progress mobility as able. Recommend post acute rehab <3 hours therapy/day upon d/c.      If plan is discharge home, recommend the following: Two people to help with walking and/or transfers;Two people to help with bathing/dressing/bathroom;Direct supervision/assist for medications management;Assistance with cooking/housework;Direct supervision/assist for financial management;Supervision due to cognitive status;Assistance with feeding   Can travel by private vehicle   No    Equipment Recommendations Other (comment) (defer to next venue)  Recommendations for Other Services       Functional Status Assessment Patient has had a recent decline in their  functional status and demonstrates the ability to make significant improvements in function in a reasonable and predictable amount of time.     Precautions / Restrictions Precautions Precautions: Fall Precaution/Restrictions Comments: Per previous admission (08/2024), per podiatry note, recommend LLE boot for mobility. Restrictions Weight Bearing Restrictions Per Provider Order: No      Mobility  Bed Mobility Overal bed mobility: Needs Assistance Bed Mobility: Supine to Sit     Supine to sit: Total assist, Max assist, HOB elevated, Used rails Sit to supine: Max assist, Total assist, HOB elevated, Used rails   General bed mobility comments: total assist to scoot to [facility] with bed in trendelenburg position    Transfers Overall transfer level:  (unsafe to attempt at this time)                      Ambulation/Gait                  Stairs            Wheelchair Mobility     Tilt Bed    Modified Rankin (Stroke Patients Only)       Balance Overall balance assessment: Needs assistance Sitting-balance support: Single extremity supported, No upper extremity supported, Feet supported Sitting balance-Leahy Scale: Zero   Postural control: Posterior lean                                   Pertinent Vitals/Pain Pain Assessment Pain Assessment: Faces Faces Pain Scale: Hurts even more Pain Location: back Pain Descriptors / Indicators: Grimacing, Guarding Pain Intervention(s): Monitored during session, Limited activity within patient's tolerance, Repositioned    Home Living Family/patient expects to be discharged to:: Skilled nursing facility  Additional Comments: Per chart, pt presented from SNF rehab. Pt states this as well.    Prior Function Prior Level of Function : Patient poor historian/Family not available             Mobility Comments: Pt unable to provide information, no family/friends present.        Extremity/Trunk Assessment   Upper Extremity Assessment Upper Extremity Assessment: Difficult to assess due to impaired cognition;Generalized weakness    Lower Extremity Assessment Lower Extremity Assessment: Generalized weakness;Difficult to assess due to impaired cognition (BLE in prevalon boots, R hip internally rotated)       Communication   Communication Communication: Impaired Factors Affecting Communication: Difficulty expressing self;Reduced clarity of speech (attempts made at verbal communication with PT understanding <25% of what pt says)    Cognition Arousal: Lethargic (max cuing to open eyes, quickly & easily closes them & appears to fall back asleep during session)     PT - Cognitive impairments: No family/caregiver present to determine baseline, History of cognitive impairments                         Following commands: Impaired Following commands impaired: Follows one step commands inconsistently, Follows one step commands with increased time     Cueing Cueing Techniques: Verbal cues, Tactile cues, Gestural cues     General Comments      Exercises     Assessment/Plan    PT Assessment Patient needs continued PT services  PT Problem List Decreased strength;Cardiopulmonary status limiting activity;Decreased coordination;Decreased cognition;Decreased range of motion;Decreased activity tolerance;Decreased balance;Decreased mobility;Decreased knowledge of precautions;Decreased safety awareness;Decreased knowledge of use of DME       PT Treatment Interventions DME instruction;Balance training;Neuromuscular re-education;Gait training;Cognitive remediation;Functional mobility training;Therapeutic activities;Therapeutic exercise;Wheelchair mobility training;Patient/family education;Manual techniques;Modalities    PT Goals (Current goals can be found in the Care Plan section)  Acute Rehab PT Goals Patient Stated Goal: none stated PT Goal Formulation:  Patient unable to participate in goal setting Time For Goal Achievement: 09/30/24 Potential to Achieve Goals: Poor    Frequency Min 1X/week     Co-evaluation               AM-PAC PT 6 Clicks Mobility  Outcome Measure Help needed turning from your back to your side while in a flat bed without using bedrails?: Total Help needed moving from lying on your back to sitting on the side of a flat bed without using bedrails?: Total Help needed moving to and from a bed to a chair (including a wheelchair)?: Total Help needed standing up from a chair using your arms (e.g., wheelchair or bedside chair)?: Total Help needed to walk in hospital room?: Total Help needed climbing 3-5 steps with a railing? : Total 6 Click Score: 6    End of Session   Activity Tolerance: Patient limited by fatigue Patient left: in bed;with call bell/phone within reach;with bed alarm set (set up with meal tray) Nurse Communication:  (pt may need assistance to self feed) PT Visit Diagnosis: Muscle weakness (generalized) (M62.81);Other abnormalities of gait and mobility (R26.89);Difficulty in walking, not elsewhere classified (R26.2)    Time: 0950-1003 PT Time Calculation (min) (ACUTE ONLY): 13 min   Charges:   PT Evaluation $PT Eval Moderate Complexity: 1 Mod   PT General Charges $$ ACUTE PT VISIT: 1 Visit         Richerd Pinal, PT, DPT 09/16/24, 10:16 AM   Richerd CHRISTELLA Pinal 09/16/2024, 10:12 AM

## 2024-09-16 NOTE — Progress Notes (Signed)
 [facility] Progress Note  Patient: Shelby [name]    [name]  DOA: 09/15/2024     Date of Service: the patient was seen and examined on 09/16/2024  Chief Complaint  Patient presents with   Altered Mental Status   Weakness   Brief hospital course: Shelby [name] is a 83 y.o. female with medical history significant of Hyperparathyroidism, dementia, heart failure with reduced ejection fraction, anemia of chronic disease, dilated cardiomyopathy, type 2 diabetes, essential hypertension, hyperlipidemia, obstructive sleep apnea, peripheral neuropathy, chronic kidney disease stage III, history of seizure-like activity and urinary incontinence who was brought in from [facility] by family wanting her to be checked.  Patient was recently diagnosed with UTI pneumonia and CHF exacerbation.  She has been taking medications at the facility.  Family visited and noticed she is more confused.  Patient is currently unable to give history.  She also altered.  She responds to voice by opening her eyes.  Workup performed showed significant change from last month.  Her sodium is 150, calcium  14.2 and phosphorus only 1.  Patient also has known history of chronic kidney disease creatinine is 1.87.  Head CT without contrast showed no acute findings.  Nephrology consulted and recommends admission for treatment of hypercalcemia and hyponatremia.    Assessment and Plan:  # Acute metabolic encephalopathy: Most likely multifactorial.  Hypercalcemia and hypernatremia. UA negative  Continue supportive care, delirium precautions Fall precautions   # Hypercalcemia: Due to known history of hyperparathyroidism Continue to monitor on telemetry Continue Sensipar, patient was on 8 due to known history of hyperparathyroidism Continue IVF D5 0.45 saline for hydration Nephrology consulted, pamidronate order placed Monitor BMP daily    # Hypernatremia: Most likely due to dehydration.   Aggressively hydrate and monitor   # Hypophosphatemia secondary to nutritional deficiency. Phos repleted. Check electrolytes daily  # type 2 diabetes: Initiate sliding scale insulin .   # Essential hypertension: Off medications at this time Continue to monitor BP and titrate medications accordingly    # Acute on chronic kidney disease stage IIIb: Renal function appears to be at baseline.  Continue to monitor   # hyperlipidemia: Will resume statin when stable.    # dilated cardiomyopathy: Appears compensated.  Hold off medications for now, resume when patient is stable.   # dementia: Currently obtunded. # obstructive sleep apnea: Consider CPAP at night.  # Obesity class II Body mass index is 36.25 kg/m.  Interventions: Calorie restricted diet and daily exercise will be advised to lose body weight.  Lifestyle modification needs to be discussed with patient has severe dementia    Wound 08/18/24 1821 Pressure Injury Buttocks Mid Stage 2 -  Partial thickness loss of dermis presenting as a shallow open injury with a red, pink wound bed without slough. (Active)     Diet: Dysphagia 3 diet DVT Prophylaxis: Subcutaneous Heparin     Advance goals of care discussion: Full code  Family Communication: family was present at bedside, at the time of interview.  The pt provided permission to discuss medical plan with the family. Opportunity was given to ask question and all questions were answered satisfactorily.   Disposition:  Pt is from SNF, admitted with AME due to hypercalcemia, still has elevated calcium  level, which precludes a safe discharge. Discharge to SNF, when stable, may need few days to improve.  Subjective: No significant event overnight, patient was feeling chilly and cold and shivering, wrapped in a blanket.  Family was at bedside.  Patient  has significant dementia unable to offer any other complaints.  Pleasantly confused, able to tell me her name, and she knows she is at  [facility].  She is not aware of anything else.  Physical Exam: General: NAD, lying comfortably Appear in no distress, affect appropriate Eyes: PERRLA ENT: Oral Mucosa Clear, moist  Neck: no JVD,  Cardiovascular: S1 and S2 Present, no Murmur,  Respiratory: good respiratory effort, Bilateral Air entry equal and Decreased, no Crackles, no wheezes Abdomen: Bowel Sound present, Soft and no tenderness,  Skin: no rashes Extremities: no Pedal edema, no calf tenderness Neurologic: without any new focal findings Gait not checked due to patient safety concerns  Vitals:   09/15/24 2157 09/16/24 0242 09/16/24 0800 09/16/24 0811  BP: 119/77 117/71  109/68  Pulse: 76 73  70  Resp: 16 16  16   Temp: (!) 97.5 F (36.4 C) (!) 97 F (36.1 C)  97.7 F (36.5 C)  TempSrc: Oral   Axillary  SpO2: 91% 99%  93%  Weight:   84.2 kg     Intake/Output Summary (Last 24 hours) at 09/16/2024 1532 Last data filed at 09/16/2024 1300 Gross per 24 hour  Intake 647.46 ml  Output 400 ml  Net 247.46 ml   Filed Weights   09/16/24 0800  Weight: 84.2 kg    Data Reviewed: I have personally reviewed and interpreted daily labs, tele strips, imagings as discussed above. I reviewed all nursing notes, pharmacy notes, vitals, pertinent old records I have discussed plan of care as described above with RN and patient/family.  CBC: Recent Labs  Lab 09/15/24 1440 09/16/24 0500  WBC 8.0 6.7  HGB 13.5 13.9  HCT 44.5 44.7  MCV 98.7 97.4  PLT 199 156   Basic Metabolic Panel: Recent Labs  Lab 09/15/24 1440 09/16/24 0500  NA 150* 150*  K 4.1 3.7  CL 106 104  CO2 36* 32  GLUCOSE 121* 157*  BUN 44* 46*  CREATININE 1.87* 1.65*  CALCIUM  14.2* 13.5*  MG  --  2.3  PHOS 1.0* 1.5*    Studies: CT Head Wo Contrast Result Date: 09/15/2024 CLINICAL DATA:  Altered mental status. EXAM: CT HEAD WITHOUT CONTRAST TECHNIQUE: Contiguous axial images were obtained from the base of the skull through the vertex without  intravenous contrast. RADIATION DOSE REDUCTION: This exam was performed according to the departmental dose-optimization program which includes automated exposure control, adjustment of the mA and/or kV according to patient size and/or use of iterative reconstruction technique. COMPARISON:  07/21/2024 FINDINGS: Brain: No intracranial hemorrhage, mass effect, or midline shift. Stable atrophy. No hydrocephalus. The basilar cisterns are patent. Stable chronic small vessel ischemia. No evidence of territorial infarct or acute ischemia. No extra-axial or intracranial fluid collection. Vascular: Atherosclerosis of skullbase vasculature without hyperdense vessel or abnormal calcification. Skull: No fracture or focal lesion. Sinuses/Orbits: No acute finding. Other: None. IMPRESSION: 1. No acute intracranial abnormality. 2. Stable atrophy and chronic small vessel ischemia. Electronically Signed   By: Andrea Gasman M.D.   On: 09/15/2024 18:10    Scheduled Meds:  [START ON 09/17/2024] cinacalcet  30 mg Oral Q breakfast   heparin   5,000 Units Subcutaneous Q8H   insulin  aspart  0-15 Units Subcutaneous TID WC   insulin  aspart  0-5 Units Subcutaneous QHS   Continuous Infusions:  dextrose 5 % and 0.45 % NaCl with KCl 10 mEq/L 125 mL/hr at 09/16/24 1349   PRN Meds: acetaminophen  **OR** acetaminophen , HYDROcodone-acetaminophen , ondansetron  **OR** ondansetron  (ZOFRAN ) IV  Time spent:  55 minutes  Author: ELVAN SOR. MD [facility] 09/16/2024 3:32 PM  To reach On-call, see care teams to locate the attending and reach out to them via [email or web address]. If 7PM-7AM, please contact night-coverage If you still have difficulty reaching the attending provider, please page the [facility] (Director on Call) for [facility] on amion for assistance.

## 2024-09-16 NOTE — Plan of Care (Signed)
  Problem: Coping: Goal: Ability to adjust to condition or change in health will improve Outcome: Progressing   Problem: Metabolic: Goal: Ability to maintain appropriate glucose levels will improve Outcome: Progressing   Problem: Skin Integrity: Goal: Risk for impaired skin integrity will decrease Outcome: Progressing   Problem: Clinical Measurements: Goal: Ability to maintain clinical measurements within normal limits will improve Outcome: Progressing

## 2024-09-16 NOTE — Evaluation (Signed)
 Clinical/Bedside Swallow Evaluation Patient Details  Name: Shelby [name] MRN: [id number] Date of Birth: 1941/07/08  Today's Date: 09/16/2024 Time: SLP Start Time (ACUTE ONLY): 1045 SLP Stop Time (ACUTE ONLY): 1140 SLP Time Calculation (min) (ACUTE ONLY): 55 min  Past Medical History:  Past Medical History:  Diagnosis Date   Anemia    Cardiomyopathy (HCC)    Ejection Fraction 30-35% per ECHO 2016   CHF (congestive heart failure) (HCC)    Diabetes (HCC)    Dyspnea    Dysrhythmia    Hyperlipidemia    Hypertension    Peripheral neuropathy    Peripheral neuropathy    Sleep apnea    Past Surgical History:  Past Surgical History:  Procedure Laterality Date   ABDOMINAL HYSTERECTOMY     COLONOSCOPY     COLONOSCOPY N/A 04/01/2018   Procedure: COLONOSCOPY;  Surgeon: Gaylyn Gladis PENNER, MD;  Location: [facility] ENDOSCOPY;  Service: Endoscopy;  Laterality: N/A;   COLONOSCOPY WITH PROPOFOL  N/A 12/29/2017   Procedure: COLONOSCOPY WITH PROPOFOL ;  Surgeon: Gaylyn Gladis PENNER, MD;  Location: [facility] ENDOSCOPY;  Service: Endoscopy;  Laterality: N/A;   ESOPHAGOGASTRODUODENOSCOPY (EGD) WITH PROPOFOL  N/A 12/29/2017   Procedure: ESOPHAGOGASTRODUODENOSCOPY (EGD) WITH PROPOFOL ;  Surgeon: Gaylyn Gladis PENNER, MD;  Location: [facility] ENDOSCOPY;  Service: Endoscopy;  Laterality: N/A;   ESOPHAGOGASTRODUODENOSCOPY (EGD) WITH PROPOFOL  N/A 04/01/2018   Procedure: ESOPHAGOGASTRODUODENOSCOPY (EGD) WITH PROPOFOL ;  Surgeon: Gaylyn Gladis PENNER, MD;  Location: [facility] ENDOSCOPY;  Service: Endoscopy;  Laterality: N/A;   EUS N/A 05/20/2018   Procedure: ESOPHAGEAL ENDOSCOPIC ULTRASOUND (EUS) RADIAL;  Surgeon: Queenie Asberry LABOR, MD;  Location: [facility] ENDOSCOPY;  Service: Gastroenterology;  Laterality: N/A;   HPI:  Pt is a 83 y.o. female with medical history significant of Dementia, UTI, Hiatal Hernia per Imaging in 2019, hyperparathyroidism, heart failure with reduced ejection fraction, O2 chronic-2L, anemia of chronic  disease, dilated cardiomyopathy, type 2 diabetes, essential hypertension, hyperlipidemia, obstructive sleep apnea, peripheral neuropathy, chronic kidney disease stage III, history of seizure-like activity and urinary incontinence who was brought in from [facility] by family wanting her to be checked. OF NOTE: per chart notes, she presented from home to the ED on 08/18/2024 with weakness x several days.  She states that since she had her ankle fracture 3 weeks ago, she has been mostly immobile and has had progressively worsening weakness.  Since then, patient was recently diagnosed with UTI, pneumonia and CHF exacerbation.  She has been taking medications at the facility.  Family visited and noticed she is more confused than usual.  Pt admitted w/ dxs including: Acute metabolic encephalopathy: Most likely multifactorial; hypernatremia d/t dehydration.   CXR on 9/11: No evidence of acute cardiopulmonary process.  Head CT this admit: no acute abnormality.    Assessment / Plan / Recommendation  Clinical Impression   Pt seen for BSE this morning. Pt awakened easily w/ verbal cue. She followed basic commands w/ cue and answered direct questions re: self. She endorsed not hungry when asked if she wanted items from her breakfast tray(banana, muffin). Engaged pt for items for Lunch meal and ordered them for her. MOD assistance appears indicated w/ tasks.  On RA, afebrile. WBC WNL.  Pt appears to present w/ functional oropharyngeal phase swallowing w/ No oropharyngeal phase dysphagia noted, No neuromuscular deficits noted. Pt consumed po trials w/ No overt, clinical s/s of aspiration during the po trials.  Pt appears at reduced risk for aspiration following general aspiration precautions. However, pt does have challenging factors that could impact  her oropharyngeal swallowing to include Baseline Dementia/Cognitive decline, deconditioning/weakness, need for support/setup at meals, and current  illness/hospitalization. These factors can increase risk for dysphagia as well as decreased oral intake overall.  Pt needed Encouragement to take po's.   During po trials, pt consumed all consistencies w/ no overt coughing, decline in vocal quality, or change in respiratory presentation during/post trials. O2 sats remained in upper 90s during. Oral phase appeared [facility] w/ timely bolus management, mastication, and control of bolus propulsion for A-P transfer for swallowing. Oral clearing achieved w/ all trial consistencies -- moistened, soft foods given.  OM Exam appeared [facility] w/ no unilateral weakness noted. Speech Clear. Pt fed self w/ setup support.   Recommend a more Mech Soft/Regular consistency diet w/ well-Cut meats/foods, moistened foods = ease of eating/chewing and self-feeding. Thin liquids -- monitor straw use, and pt should Hold Cup when drinking. Recommend general aspiration precautions including small bites/sips slowly; tray setup and sitting upright for all oral intake. Reduce distractions at meals. Pills WHOLE in Puree for safer, easier swallowing if needed. Reflux precautions.  Education given on Pills in Puree; food consistencies and easy to eat options; general aspiration and Reflux precautions to pt and NSG. NSG to reconsult if any new needs arise. NSG updated, agreed. MD updated. Recommend Dietician f/u for support. SLP Visit Diagnosis: Dysphagia, unspecified (R13.10) (Baseline Dementia; Hiatal Hernia per Imaging 2019)    Aspiration Risk   (reduced following general aspiration precautions)    Diet Recommendation   Thin;Dysphagia 3 (mechanical soft) (cut meats for ease of self-feeding) = a more Mech Soft/Regular consistency diet w/ well-Cut meats/foods, moistened foods = ease of eating/chewing and self-feeding. Thin liquids -- monitor straw use, and pt should Hold Cup when drinking. Recommend general aspiration precautions including small bites/sips slowly; tray setup and sitting  upright for all oral intake. Reduce distractions at meals. Reflux precautions.  Medication Administration: Whole meds with puree (as needed for ease/safety of swallowing)    Other  Recommendations Recommended Consults:  (Dietician) Oral Care Recommendations: Oral care BID;Staff/trained caregiver to provide oral care (support)     Assistance Recommended at Discharge  Full at meals d/t Dementia  Functional Status Assessment Patient has had a recent decline in their functional status and demonstrates the ability to make significant improvements in function in a reasonable and predictable amount of time.  Frequency and Duration  (n/a)   (n/a)       Prognosis Prognosis for improved oropharyngeal function: Fair (-Good) Barriers to Reach Goals: Cognitive deficits Barriers/Prognosis Comment: Baseline Dementia; Hiatal Hernia per Imaging 2019      Swallow Study   General Date of Onset: 09/15/24 HPI: Pt is a 83 y.o. female with medical history significant of Dementia, UTI, Hiatal Hernia per Imaging in 2019, hyperparathyroidism, heart failure with reduced ejection fraction, O2 chronic-2L, anemia of chronic disease, dilated cardiomyopathy, type 2 diabetes, essential hypertension, hyperlipidemia, obstructive sleep apnea, peripheral neuropathy, chronic kidney disease stage III, history of seizure-like activity and urinary incontinence who was brought in from [facility] by family wanting her to be checked. OF NOTE: per chart notes, she presented from home to the ED on 08/18/2024 with weakness x several days.  She states that since she had her ankle fracture 3 weeks ago, she has been mostly immobile and has had progressively worsening weakness.  Since then, patient was recently diagnosed with UTI, pneumonia and CHF exacerbation.  She has been taking medications at the facility.  Family visited and noticed she is  more confused than usual.  Pt admitted w/ dxs including: Acute metabolic encephalopathy:  Most likely multifactorial; hypernatremia d/t dehydration.   CXR on 9/11: No evidence of acute cardiopulmonary process.  Head CT this admit: no acute abnormality. Type of Study: Bedside Swallow Evaluation Previous Swallow Assessment: none Diet Prior to this Study: Regular;Thin liquids (Level 0) Temperature Spikes Noted: No (wbc 6.7) Respiratory Status: Room air History of Recent Intubation: No Behavior/Cognition: Alert;Cooperative;Pleasant mood;Confused;Distractible;Requires cueing (baseline Dementia) Oral Cavity Assessment: Within Functional Limits Oral Care Completed by SLP: Yes Oral Cavity - Dentition: Adequate natural dentition Vision: Functional for self-feeding Self-Feeding Abilities: Able to feed self;Needs assist;Needs set up Patient Positioning: Upright in bed (full assist) Baseline Vocal Quality: Normal;Low vocal intensity Volitional Cough: Strong Volitional Swallow: Able to elicit    Oral/Motor/Sensory Function Overall Oral Motor/Sensory Function: Within functional limits   Ice Chips Ice chips: Within functional limits Presentation: Spoon (fed; 2 trials)   Thin Liquid Thin Liquid: Within functional limits Presentation: Cup;Self Fed;Straw (~5-6 trials via each)    Nectar Thick Nectar Thick Liquid: Not tested   Honey Thick Honey Thick Liquid: Not tested   Puree Puree: Within functional limits Presentation: Spoon (fed; 5 trials)   Solid     Solid: Within functional limits Presentation:  (fed; 4 trials total) Other Comments: muffins, graham crackers moistened        Comer Portugal, MS, CCC-SLP Speech Language Pathologist Rehab Services; [facility] [phone] (ascom) Michaelina Blandino 09/16/2024,4:28 PM

## 2024-09-16 NOTE — Progress Notes (Signed)
 [facility]  ROUNDING NOTE   Subjective:   Shelby [name] is a 83 y.o. female with past medical history including diabetes, hypertension, proteinuria, and hypercalcemia. Patient presents to the ED with altered mental status from SNF and has been admitted for Hypercalcemia [E83.52]  Patient is known to our practice and is followed by Dr. Marcelino.  Patient was last seen in office in July of this year.  Patient was sent to emergency department at the family's request for further evaluation for altered mental status.  Patient was found to have elevated sodium and calcium  levels.  Labs on ED arrival shows sodium 115, serum bicarb 36, creatinine 1.87 with GFR 27, calcium  14.2, phosphorus 1.0.  UA appears clear.  CT head negative.  We have been consulted to help manage hypercalcemia.   Objective:  Vital signs in last 24 hours:  Temp:  [97 F (36.1 C)-98.3 F (36.8 C)] 97.7 F (36.5 C) (10/10 0811) Pulse Rate:  [70-85] 70 (10/10 0811) Resp:  [11-18] 16 (10/10 0811) BP: (106-152)/(68-134) 109/68 (10/10 0811) SpO2:  [87 %-100 %] 93 % (10/10 0811) Weight:  [84.2 kg] 84.2 kg (10/10 0800)  Weight change:  Filed Weights   09/16/24 0800  Weight: 84.2 kg    Intake/Output: I/O last 3 completed shifts: In: 647.5 [I.V.:647.5] Out: -    Intake/Output this shift:  No intake/output data recorded.  Physical Exam: General: NAD  Head: Normocephalic, atraumatic. Moist oral mucosal membranes  Eyes: Anicteric  Lungs:  Clear to auscultation, normal effort  Heart: Regular rate and rhythm  Abdomen:  Soft, nontender  Extremities: No peripheral edema.  Neurologic: Awake, alert, oriented to self  Skin: Warm,dry, no rash  Access: None    Basic Metabolic Panel: Recent Labs  Lab 09/15/24 1440 09/16/24 0500  NA 150* 150*  K 4.1 3.7  CL 106 104  CO2 36* 32  GLUCOSE 121* 157*  BUN 44* 46*  CREATININE 1.87* 1.65*  CALCIUM  14.2* 13.5*  MG  --  2.3  PHOS 1.0* 1.5*     Liver Function Tests: Recent Labs  Lab 09/15/24 1440 09/16/24 0500  AST 37 37  ALT 26 23  ALKPHOS 59 61  BILITOT 0.7 0.6  PROT 7.3 6.8  ALBUMIN  3.4* 3.2*   No results for input(s): LIPASE, AMYLASE in the last 168 hours. No results for input(s): AMMONIA in the last 168 hours.  CBC: Recent Labs  Lab 09/15/24 1440 09/16/24 0500  WBC 8.0 6.7  HGB 13.5 13.9  HCT 44.5 44.7  MCV 98.7 97.4  PLT 199 156    Cardiac Enzymes: No results for input(s): CKTOTAL, CKMB, CKMBINDEX, TROPONINI in the last 168 hours.  BNP: Invalid input(s): POCBNP  CBG: Recent Labs  Lab 09/15/24 2242 09/16/24 0825 09/16/24 1132  GLUCAP 133* 145* 95    Microbiology: Results for orders placed or performed during the hospital encounter of 08/18/24  Resp panel by RT-PCR (RSV, Flu A&B, Covid) Anterior Nasal Swab     Status: None   Collection Time: 08/18/24  1:12 PM   Specimen: Anterior Nasal Swab  Result Value Ref Range Status   SARS Coronavirus 2 by RT PCR NEGATIVE NEGATIVE Final    Comment: (NOTE) SARS-CoV-2 target nucleic acids are NOT DETECTED.  The SARS-CoV-2 RNA is generally detectable in upper respiratory specimens during the acute phase of infection. The lowest concentration of SARS-CoV-2 viral copies this assay can detect is 138 copies/mL. A negative result does not preclude SARS-Cov-2 infection and should not  be used as the sole basis for treatment or other patient management decisions. A negative result may occur with  improper specimen collection/handling, submission of specimen other than nasopharyngeal swab, presence of viral mutation(s) within the areas targeted by this assay, and inadequate number of viral copies(<138 copies/mL). A negative result must be combined with clinical observations, patient history, and epidemiological information. The expected result is Negative.  Fact Sheet for Patients:  BloggerCourse.com  Fact Sheet  for Healthcare Providers:  SeriousBroker.it  This test is no t yet approved or cleared by the [place]  FDA and  has been authorized for detection and/or diagnosis of SARS-CoV-2 by FDA under an Emergency Use Authorization (EUA). This EUA will remain  in effect (meaning this test can be used) for the duration of the COVID-19 declaration under Section 564(b)(1) of the Act, 21 U.S.C.section 360bbb-3(b)(1), unless the authorization is terminated  or revoked sooner.       Influenza A by PCR NEGATIVE NEGATIVE Final   Influenza B by PCR NEGATIVE NEGATIVE Final    Comment: (NOTE) The Xpert Xpress SARS-CoV-2/FLU/RSV plus assay is intended as an aid in the diagnosis of influenza from Nasopharyngeal swab specimens and should not be used as a sole basis for treatment. Nasal washings and aspirates are unacceptable for Xpert Xpress SARS-CoV-2/FLU/RSV testing.  Fact Sheet for Patients: BloggerCourse.com  Fact Sheet for Healthcare Providers: SeriousBroker.it  This test is not yet approved or cleared by the [place]  FDA and has been authorized for detection and/or diagnosis of SARS-CoV-2 by FDA under an Emergency Use Authorization (EUA). This EUA will remain in effect (meaning this test can be used) for the duration of the COVID-19 declaration under Section 564(b)(1) of the Act, 21 U.S.C. section 360bbb-3(b)(1), unless the authorization is terminated or revoked.     Resp Syncytial Virus by PCR NEGATIVE NEGATIVE Final    Comment: (NOTE) Fact Sheet for Patients: BloggerCourse.com  Fact Sheet for Healthcare Providers: SeriousBroker.it  This test is not yet approved or cleared by the [place]  FDA and has been authorized for detection and/or diagnosis of SARS-CoV-2 by FDA under an Emergency Use Authorization (EUA). This EUA will remain in effect (meaning  this test can be used) for the duration of the COVID-19 declaration under Section 564(b)(1) of the Act, 21 U.S.C. section 360bbb-3(b)(1), unless the authorization is terminated or revoked.  Performed at [facility], [address] [place], [place] [address]   Urine Culture     Status: Abnormal   Collection Time: 08/18/24  1:12 PM   Specimen: Urine, Catheterized  Result Value Ref Range Status   Specimen Description   Final    URINE, CATHETERIZED Performed at [facility], [address], [place], [place] [address]    Special Requests   Final    NONE Performed at [facility], [address] [place], [place] [address]    Culture (A)  Final    >=100,000 COLONIES/mL KLEBSIELLA PNEUMONIAE >=100,000 COLONIES/mL ESCHERICHIA COLI    Report Status 08/21/2024 FINAL  Final   Organism ID, Bacteria KLEBSIELLA PNEUMONIAE (A)  Final   Organism ID, Bacteria ESCHERICHIA COLI (A)  Final      Susceptibility   Escherichia coli - MIC*    AMPICILLIN <=2 SENSITIVE Sensitive     CEFAZOLIN (URINE) Value in next row Sensitive      <=1 SENSITIVEThis is a modified FDA-approved test that has been validated and its performance characteristics determined by the reporting laboratory.  This laboratory is certified  under the Clinical Laboratory Improvement Amendments CLIA as qualified to perform high complexity clinical laboratory testing.    CEFEPIME Value in next row Sensitive      <=1 SENSITIVEThis is a modified FDA-approved test that has been validated and its performance characteristics determined by the reporting laboratory.  This laboratory is certified under the Clinical Laboratory Improvement Amendments CLIA as qualified to perform high complexity clinical laboratory testing.    ERTAPENEM Value in next row Sensitive      <=1 SENSITIVEThis is a modified FDA-approved test that has been validated and its performance characteristics determined by the reporting laboratory.  This  laboratory is certified under the Clinical Laboratory Improvement Amendments CLIA as qualified to perform high complexity clinical laboratory testing.    CEFTRIAXONE  Value in next row Sensitive      <=1 SENSITIVEThis is a modified FDA-approved test that has been validated and its performance characteristics determined by the reporting laboratory.  This laboratory is certified under the Clinical Laboratory Improvement Amendments CLIA as qualified to perform high complexity clinical laboratory testing.    CIPROFLOXACIN Value in next row Sensitive      <=1 SENSITIVEThis is a modified FDA-approved test that has been validated and its performance characteristics determined by the reporting laboratory.  This laboratory is certified under the Clinical Laboratory Improvement Amendments CLIA as qualified to perform high complexity clinical laboratory testing.    GENTAMICIN Value in next row Sensitive      <=1 SENSITIVEThis is a modified FDA-approved test that has been validated and its performance characteristics determined by the reporting laboratory.  This laboratory is certified under the Clinical Laboratory Improvement Amendments CLIA as qualified to perform high complexity clinical laboratory testing.    NITROFURANTOIN Value in next row Sensitive      <=1 SENSITIVEThis is a modified FDA-approved test that has been validated and its performance characteristics determined by the reporting laboratory.  This laboratory is certified under the Clinical Laboratory Improvement Amendments CLIA as qualified to perform high complexity clinical laboratory testing.    TRIMETH/SULFA Value in next row Sensitive      <=1 SENSITIVEThis is a modified FDA-approved test that has been validated and its performance characteristics determined by the reporting laboratory.  This laboratory is certified under the Clinical Laboratory Improvement Amendments CLIA as qualified to perform high complexity clinical laboratory testing.     AMPICILLIN/SULBACTAM Value in next row Sensitive      <=1 SENSITIVEThis is a modified FDA-approved test that has been validated and its performance characteristics determined by the reporting laboratory.  This laboratory is certified under the Clinical Laboratory Improvement Amendments CLIA as qualified to perform high complexity clinical laboratory testing.    PIP/TAZO Value in next row Sensitive ug/mL     <=4 SENSITIVEThis is a modified FDA-approved test that has been validated and its performance characteristics determined by the reporting laboratory.  This laboratory is certified under the Clinical Laboratory Improvement Amendments CLIA as qualified to perform high complexity clinical laboratory testing.    MEROPENEM Value in next row Sensitive      <=4 SENSITIVEThis is a modified FDA-approved test that has been validated and its performance characteristics determined by the reporting laboratory.  This laboratory is certified under the Clinical Laboratory Improvement Amendments CLIA as qualified to perform high complexity clinical laboratory testing.    * >=100,000 COLONIES/mL ESCHERICHIA COLI   Klebsiella pneumoniae - MIC*    AMPICILLIN Value in next row Resistant      <=4 SENSITIVEThis is a  modified FDA-approved test that has been validated and its performance characteristics determined by the reporting laboratory.  This laboratory is certified under the Clinical Laboratory Improvement Amendments CLIA as qualified to perform high complexity clinical laboratory testing.    CEFAZOLIN (URINE) Value in next row Sensitive      2 SENSITIVEThis is a modified FDA-approved test that has been validated and its performance characteristics determined by the reporting laboratory.  This laboratory is certified under the Clinical Laboratory Improvement Amendments CLIA as qualified to perform high complexity clinical laboratory testing.    CEFEPIME Value in next row Sensitive      2 SENSITIVEThis is a modified  FDA-approved test that has been validated and its performance characteristics determined by the reporting laboratory.  This laboratory is certified under the Clinical Laboratory Improvement Amendments CLIA as qualified to perform high complexity clinical laboratory testing.    ERTAPENEM Value in next row Sensitive      2 SENSITIVEThis is a modified FDA-approved test that has been validated and its performance characteristics determined by the reporting laboratory.  This laboratory is certified under the Clinical Laboratory Improvement Amendments CLIA as qualified to perform high complexity clinical laboratory testing.    CEFTRIAXONE  Value in next row Sensitive      2 SENSITIVEThis is a modified FDA-approved test that has been validated and its performance characteristics determined by the reporting laboratory.  This laboratory is certified under the Clinical Laboratory Improvement Amendments CLIA as qualified to perform high complexity clinical laboratory testing.    CIPROFLOXACIN Value in next row Sensitive      2 SENSITIVEThis is a modified FDA-approved test that has been validated and its performance characteristics determined by the reporting laboratory.  This laboratory is certified under the Clinical Laboratory Improvement Amendments CLIA as qualified to perform high complexity clinical laboratory testing.    GENTAMICIN Value in next row Sensitive      2 SENSITIVEThis is a modified FDA-approved test that has been validated and its performance characteristics determined by the reporting laboratory.  This laboratory is certified under the Clinical Laboratory Improvement Amendments CLIA as qualified to perform high complexity clinical laboratory testing.    NITROFURANTOIN Value in next row Intermediate      2 SENSITIVEThis is a modified FDA-approved test that has been validated and its performance characteristics determined by the reporting laboratory.  This laboratory is certified under the Clinical  Laboratory Improvement Amendments CLIA as qualified to perform high complexity clinical laboratory testing.    TRIMETH/SULFA Value in next row Sensitive      2 SENSITIVEThis is a modified FDA-approved test that has been validated and its performance characteristics determined by the reporting laboratory.  This laboratory is certified under the Clinical Laboratory Improvement Amendments CLIA as qualified to perform high complexity clinical laboratory testing.    AMPICILLIN/SULBACTAM Value in next row Sensitive      2 SENSITIVEThis is a modified FDA-approved test that has been validated and its performance characteristics determined by the reporting laboratory.  This laboratory is certified under the Clinical Laboratory Improvement Amendments CLIA as qualified to perform high complexity clinical laboratory testing.    PIP/TAZO Value in next row Sensitive ug/mL     <=4 SENSITIVEThis is a modified FDA-approved test that has been validated and its performance characteristics determined by the reporting laboratory.  This laboratory is certified under the Clinical Laboratory Improvement Amendments CLIA as qualified to perform high complexity clinical laboratory testing.    MEROPENEM Value in next row Sensitive      <=  4 SENSITIVEThis is a modified FDA-approved test that has been validated and its performance characteristics determined by the reporting laboratory.  This laboratory is certified under the Clinical Laboratory Improvement Amendments CLIA as qualified to perform high complexity clinical laboratory testing.    * >=100,000 COLONIES/mL KLEBSIELLA PNEUMONIAE  Culture, blood (Routine X 2) w Reflex to ID Panel     Status: None   Collection Time: 08/18/24  3:45 PM   Specimen: BLOOD  Result Value Ref Range Status   Specimen Description BLOOD BLOOD RIGHT HAND  Final   Special Requests   Final    BOTTLES DRAWN AEROBIC AND ANAEROBIC Blood Culture results may not be optimal due to an inadequate volume of  blood received in culture bottles   Culture   Final    NO GROWTH 5 DAYS Performed at [facility], [address] [place], [place] [address]    Report Status 08/23/2024 FINAL  Final  Culture, blood (Routine X 2) w Reflex to ID Panel     Status: None   Collection Time: 08/18/24  3:45 PM   Specimen: BLOOD  Result Value Ref Range Status   Specimen Description BLOOD BLOOD LEFT HAND  Final   Special Requests   Final    AEROBIC BOTTLE ONLY Blood Culture results may not be optimal due to an inadequate volume of blood received in culture bottles   Culture   Final    NO GROWTH 5 DAYS Performed at [facility], [address], [place], [place] [address]    Report Status 08/23/2024 FINAL  Final    Coagulation Studies: No results for input(s): LABPROT, INR in the last 72 hours.  Urinalysis: No results for input(s): COLORURINE, LABSPEC, PHURINE, GLUCOSEU, HGBUR, BILIRUBINUR, KETONESUR, PROTEINUR, UROBILINOGEN, NITRITE, LEUKOCYTESUR in the last 72 hours.  Invalid input(s): APPERANCEUR    Imaging: CT Head Wo Contrast Result Date: 09/15/2024 CLINICAL DATA:  Altered mental status. EXAM: CT HEAD WITHOUT CONTRAST TECHNIQUE: Contiguous axial images were obtained from the base of the skull through the vertex without intravenous contrast. RADIATION DOSE REDUCTION: This exam was performed according to the departmental dose-optimization program which includes automated exposure control, adjustment of the mA and/or kV according to patient size and/or use of iterative reconstruction technique. COMPARISON:  07/21/2024 FINDINGS: Brain: No intracranial hemorrhage, mass effect, or midline shift. Stable atrophy. No hydrocephalus. The basilar cisterns are patent. Stable chronic small vessel ischemia. No evidence of territorial infarct or acute ischemia. No extra-axial or intracranial fluid collection. Vascular: Atherosclerosis of skullbase vasculature without  hyperdense vessel or abnormal calcification. Skull: No fracture or focal lesion. Sinuses/Orbits: No acute finding. Other: None. IMPRESSION: 1. No acute intracranial abnormality. 2. Stable atrophy and chronic small vessel ischemia. Electronically Signed   By: Andrea Gasman M.D.   On: 09/15/2024 18:10     Medications:    dextrose 5 % and 0.45 % NaCl with KCl 10 mEq/L Stopped (09/16/24 0917)   pamidronate 90 mg (09/16/24 0922)    cinacalcet  30 mg Oral Q breakfast   heparin   5,000 Units Subcutaneous Q8H   insulin  aspart  0-15 Units Subcutaneous TID WC   insulin  aspart  0-5 Units Subcutaneous QHS   acetaminophen  **OR** acetaminophen , HYDROcodone-acetaminophen , ondansetron  **OR** ondansetron  (ZOFRAN ) IV  Assessment/ Plan:  Ms. Sigrid Schwebach is a 83 y.o.  female with past medical history including diabetes, hypertension, proteinuria, and hypercalcemia. Patient presents to the ED with altered mental status from SNF and has been admitted for Hypercalcemia [E83.52]  Severe hypercalcemia, serum calcium  14.2 on ED arrival with phosphorus 1.0. Prescribed Cinacalcet outpatient which can cause elevated calcium  levels. Will order pamidronate 90mg  today and monitor.  Continue IV hydration  2. Diabetes mellitus type II with chronic kidney disease/renal manifestations: noninsulin dependent.  Most recent hemoglobin A1c is 5.9 on 05/19/24.   3. Hypertension with chronic kidney disease. Home regimen includes carvedilol , amlodipine , and torsemide . All currently held.      LOS: 1 Lupie Sawa 10/10/202512:32 PM

## 2024-09-16 NOTE — Evaluation (Signed)
 Occupational Therapy Evaluation Patient Details Name: Shelby [name] MRN: [id number] DOB: 09-21-1941 Today's Date: 09/16/2024   History of Present Illness   Pt is an 83 y/o F admitted on 09/15/24 after being brought in by family with reports of more confusion, recently diagnosed with UTI, PNA, & CHF exacerbation. Pt is being treated for acute metabolic encephalopathy, hypercalcemia. Of note, pt with recent admission (08/2024) for L distal fibula fx, closed nondisplaced fx of R lateral malleolus. PMH: hyperparathyroidism, dementia, heart failure with reduced EF, anemia of chronic disease, dilated cardiomyopathy, DM2, HTN, HLD, OSA, peripheral neuropathy, CKD 3, seizure like activity, urinary incontinence    Clinical Impressions Ms Scharnhorst was seen for OT evaluation this date. Prior to hospital admission, pt was recently at [facility], pt unable to state PLOF, per chart was ambulatory at 05/2024 eval. Pt currently requires SETUP self-drinking at bed level. MAX A don B socks bed level. MOD A rolling L+R at bed level for clean linen change. Initiated exiting bed as pt stating I want to get up and go back to bed despite being in bed; however, reports pain and resisting attempts to assist. Pt would benefit from skilled OT to address noted impairments and functional limitations (see below for any additional details). Upon hospital discharge, recommend OT follow up <3 hours/day.     If plan is discharge home, recommend the following:   Two people to help with walking and/or transfers;A lot of help with bathing/dressing/bathroom;Direct supervision/assist for medications management;Supervision due to cognitive status;Direct supervision/assist for financial management;Assist for transportation;Assistance with cooking/housework;Assistance with feeding;Help with stairs or ramp for entrance     Functional Status Assessment   Patient has had a recent decline in their functional status and demonstrates the  ability to make significant improvements in function in a reasonable and predictable amount of time.     Equipment Recommendations   Hospital bed;Hoyer lift     Recommendations for Other Services         Precautions/Restrictions   Precautions Precautions: Fall Recall of Precautions/Restrictions: Impaired Precaution/Restrictions Comments: Per previous admission (08/2024), per podiatry note, recommend LLE boot for mobility. Restrictions Weight Bearing Restrictions Per Provider Order: No     Mobility Bed Mobility Overal bed mobility: Needs Assistance Bed Mobility: Rolling Rolling: Mod assist         General bed mobility comments: initiated sitting EOB with pt statinggive me time and yelling in pain with attempt to assist    Transfers                   General transfer comment: unsafe to attempt          ADL either performed or assessed with clinical judgement   ADL Overall ADL's : Needs assistance/impaired                                       General ADL Comments: SETUP self-drinking at bed level. MAX A don B socks bed level. MAX A toileting rolling bed level      Pertinent Vitals/Pain Pain Assessment Pain Assessment: Faces Faces Pain Scale: Hurts even more Pain Location: BLE to touch/movement Pain Descriptors / Indicators: Grimacing, Guarding Pain Intervention(s): Limited activity within patient's tolerance, Repositioned     Extremity/Trunk Assessment Upper Extremity Assessment Upper Extremity Assessment: Generalized weakness   Lower Extremity Assessment Lower Extremity Assessment: Defer to PT evaluation       Communication  Communication Communication: Impaired Factors Affecting Communication: Reduced clarity of speech   Cognition Arousal: Lethargic Behavior During Therapy: Flat affect Cognition: No family/caregiver present to determine baseline             OT - Cognition Comments: oriented to self only,  states I want to get up and go back to bed despite being in bed                 Following commands: Impaired Following commands impaired: Follows one step commands inconsistently     Cueing  General Comments   Cueing Techniques: Verbal cues;Tactile cues;Gestural cues      Exercises     Shoulder Instructions      Home Living Family/patient expects to be discharged to:: Skilled nursing facility                                 Additional Comments: Per chart, pt presented from SNF rehab. Pt states this as well.      Prior Functioning/Environment Prior Level of Function : Patient poor historian/Family not available             Mobility Comments: Pt unable to provide information, no family/friends present.      OT Problem List: Decreased strength;Decreased cognition;Decreased activity tolerance;Decreased safety awareness;Impaired balance (sitting and/or standing);Decreased knowledge of use of DME or AE;Decreased knowledge of precautions   OT Treatment/Interventions: Self-care/ADL training;Therapeutic exercise;Cognitive remediation/compensation;Energy conservation;DME and/or AE instruction;Patient/family education;Balance training;Therapeutic activities      OT Goals(Current goals can be found in the care plan section)   Acute Rehab OT Goals Patient Stated Goal: to go to bed OT Goal Formulation: With patient Time For Goal Achievement: 09/30/24 Potential to Achieve Goals: Fair ADL Goals Pt Will Perform Grooming: with min assist;sitting Pt Will Perform Lower Body Dressing: with min assist;sitting/lateral leans Pt Will Transfer to Toilet: stand pivot transfer;with mod assist;bedside commode   OT Frequency:  Min 2X/week    Co-evaluation              AM-PAC OT 6 Clicks Daily Activity     Outcome Measure Help from another person eating meals?: None Help from another person taking care of personal grooming?: A Little Help from another  person toileting, which includes using toliet, bedpan, or urinal?: A Lot Help from another person bathing (including washing, rinsing, drying)?: A Lot Help from another person to put on and taking off regular upper body clothing?: A Little Help from another person to put on and taking off regular lower body clothing?: A Lot 6 Click Score: 16   End of Session    Activity Tolerance: Patient limited by lethargy Patient left: in bed;with call bell/phone within reach;with bed alarm set  OT Visit Diagnosis: Other abnormalities of gait and mobility (R26.89);Muscle weakness (generalized) (M62.81)                Time: 8678-8665 OT Time Calculation (min): 13 min Charges:  OT General Charges $OT Visit: 1 Visit OT Evaluation $OT Eval Low Complexity: 1 Low  Elston Slot, M.S. OTR/L  09/16/24, 1:52 PM  ascom [phone]

## 2024-09-17 ENCOUNTER — Inpatient Hospital Stay

## 2024-09-17 DIAGNOSIS — E870 Hyperosmolality and hypernatremia: Secondary | ICD-10-CM

## 2024-09-17 DIAGNOSIS — Z515 Encounter for palliative care: Secondary | ICD-10-CM

## 2024-09-17 DIAGNOSIS — I152 Hypertension secondary to endocrine disorders: Secondary | ICD-10-CM

## 2024-09-17 DIAGNOSIS — Z7189 Other specified counseling: Secondary | ICD-10-CM | POA: Diagnosis not present

## 2024-09-17 DIAGNOSIS — E1159 Type 2 diabetes mellitus with other circulatory complications: Secondary | ICD-10-CM

## 2024-09-17 DIAGNOSIS — Z789 Other specified health status: Secondary | ICD-10-CM

## 2024-09-17 DIAGNOSIS — G9341 Metabolic encephalopathy: Secondary | ICD-10-CM

## 2024-09-17 DIAGNOSIS — G4733 Obstructive sleep apnea (adult) (pediatric): Secondary | ICD-10-CM

## 2024-09-17 DIAGNOSIS — G9340 Encephalopathy, unspecified: Secondary | ICD-10-CM

## 2024-09-17 DIAGNOSIS — I5032 Chronic diastolic (congestive) heart failure: Secondary | ICD-10-CM

## 2024-09-17 DIAGNOSIS — I420 Dilated cardiomyopathy: Secondary | ICD-10-CM

## 2024-09-17 LAB — BASIC METABOLIC PANEL WITH GFR
Anion gap: 12 (ref 5–15)
Anion gap: 11 (ref 5–15)
Anion gap: 8 (ref 5–15)
BUN: 39 mg/dL — ABNORMAL HIGH (ref 8–23)
BUN: 38 mg/dL — ABNORMAL HIGH (ref 8–23)
BUN: 39 mg/dL — ABNORMAL HIGH (ref 8–23)
CO2: 32 mmol/L (ref 22–32)
CO2: 34 mmol/L — ABNORMAL HIGH (ref 22–32)
CO2: 32 mmol/L (ref 22–32)
Calcium: 12.3 mg/dL — ABNORMAL HIGH (ref 8.9–10.3)
Calcium: 12.4 mg/dL — ABNORMAL HIGH (ref 8.9–10.3)
Calcium: 11.3 mg/dL — ABNORMAL HIGH (ref 8.9–10.3)
Chloride: 104 mmol/L (ref 98–111)
Chloride: 104 mmol/L (ref 98–111)
Chloride: 106 mmol/L (ref 98–111)
Creatinine, Ser: 1.52 mg/dL — ABNORMAL HIGH (ref 0.44–1.00)
Creatinine, Ser: 1.61 mg/dL — ABNORMAL HIGH (ref 0.44–1.00)
Creatinine, Ser: 1.39 mg/dL — ABNORMAL HIGH (ref 0.44–1.00)
GFR, Estimated: 34 mL/min — ABNORMAL LOW (ref >60)
GFR, Estimated: 32 mL/min — ABNORMAL LOW (ref >60)
GFR, Estimated: 38 mL/min — ABNORMAL LOW (ref >60)
Glucose, Bld: 102 mg/dL — ABNORMAL HIGH (ref 70–99)
Glucose, Bld: 102 mg/dL — ABNORMAL HIGH (ref 70–99)
Glucose, Bld: 118 mg/dL — ABNORMAL HIGH (ref 70–99)
Potassium: 3.2 mmol/L — ABNORMAL LOW (ref 3.5–5.1)
Potassium: 3.5 mmol/L (ref 3.5–5.1)
Potassium: 3.1 mmol/L — ABNORMAL LOW (ref 3.5–5.1)
Sodium: 148 mmol/L — ABNORMAL HIGH (ref 135–145)
Sodium: 149 mmol/L — ABNORMAL HIGH (ref 135–145)
Sodium: 146 mmol/L — ABNORMAL HIGH (ref 135–145)

## 2024-09-17 LAB — CBC
HCT: 38.4 % (ref 36.0–46.0)
Hemoglobin: 12.4 g/dL (ref 12.0–15.0)
MCH: 31 pg (ref 26.0–34.0)
MCHC: 32.3 g/dL (ref 30.0–36.0)
MCV: 96 fL (ref 80.0–100.0)
Platelets: 144 K/uL — ABNORMAL LOW (ref 150–400)
RBC: 4 MIL/uL (ref 3.87–5.11)
RDW: 16.1 % — ABNORMAL HIGH (ref 11.5–15.5)
WBC: 5.6 K/uL (ref 4.0–10.5)
nRBC: 0 % (ref 0.0–0.2)

## 2024-09-17 LAB — GLUCOSE, CAPILLARY
Glucose-Capillary: 115 mg/dL — ABNORMAL HIGH (ref 70–99)
Glucose-Capillary: 121 mg/dL — ABNORMAL HIGH (ref 70–99)
Glucose-Capillary: 162 mg/dL — ABNORMAL HIGH (ref 70–99)
Glucose-Capillary: 70 mg/dL (ref 70–99)
Glucose-Capillary: 86 mg/dL (ref 70–99)
Glucose-Capillary: 86 mg/dL (ref 70–99)

## 2024-09-17 LAB — MAGNESIUM: Magnesium: 2.2 mg/dL (ref 1.7–2.4)

## 2024-09-17 LAB — PHOSPHORUS: Phosphorus: 2.4 mg/dL — ABNORMAL LOW (ref 2.5–4.6)

## 2024-09-17 MED ORDER — GADOBUTROL 1 MMOL/ML IV SOLN
8.0000 mL | Freq: Once | INTRAVENOUS | Status: AC | PRN
Start: 2024-09-17 — End: 2024-09-17
  Administered 2024-09-17: 8 mL via INTRAVENOUS

## 2024-09-17 MED ORDER — SODIUM CHLORIDE 0.9 % IV SOLN: INTRAVENOUS | Status: DC

## 2024-09-17 MED ORDER — DEXTROSE-SODIUM CHLORIDE 5-0.45 % IV SOLN: INTRAVENOUS | Status: DC

## 2024-09-17 MED ORDER — K PHOS MONO-SOD PHOS DI & MONO 155-852-130 MG PO TABS
500.0000 mg | ORAL_TABLET | Freq: Four times a day (QID) | ORAL | Status: AC
  Administered 2024-09-17 (×2): 500 mg via ORAL
  Filled 2024-09-17 (×2): qty 2

## 2024-09-17 NOTE — TOC Initial Note (Addendum)
 Transition of Care [facility]) - Initial/Assessment Note    Patient Details  Name: Shelby [name] MRN: [id number] Date of Birth: 1941-02-12  [facility] CM/SW Contact:    [place] L Tyleigh Mahn, LCSW Phone Number: 09/17/2024, 5:06 PM  Clinical Narrative:                    CSW spoke with patient son, Vinie. Vinie advised that he moved away from [place] 9 years ago and recently returned to take care of his mother. He advised that she was at [facility] but he recently decided that she is not returning. He advised that he received a call yesterday asking if he'd like the facility to save her a bed and he declined.   CSW and Vinie discussed the current recommendations for SNF. He stated that it makes sense for his mother to discharge to a SNF first prior to coming home as he acknowledged that he would have difficulty providing care with limited support. CSW advised of HHA services and while open to it, he felt that his mother would benefit from rehab first. He stated that he wanted CSW to search for beds in better quality facilities. Because he recently returned to the area, he advised of no preferences.   FL2 will be completed and a bed search will be initiated.      Patient Goals and CMS Choice            Expected Discharge Plan and Services                                              Prior Living Arrangements/Services                       Activities of Daily Living   ADL Screening (condition at time of admission) Is the patient deaf or have difficulty hearing?: No Does the patient have difficulty seeing, even when wearing glasses/contacts?: No Does the patient have difficulty concentrating, remembering, or making decisions?: Yes  Permission Sought/Granted                  Emotional Assessment              Admission diagnosis:  Hypercalcemia [E83.52] Patient Active Problem List   Diagnosis Date Noted   Acute  metabolic encephalopathy 09/15/2024   Hypernatremia 09/15/2024   Urinary tract infection 08/18/2024   Problem related to unspecified psychosocial circumstances 07/21/2024   Seizure-like activity (HCC) 03/01/2024   Hyperparathyroidism, primary 08/04/2023   Chronic idiopathic constipation 07/21/2023   Generalized anxiety disorder 07/21/2023   Syncope 07/01/2023   Type II diabetes mellitus with renal manifestations (HCC) 07/01/2023   HLD (hyperlipidemia) 07/01/2023   Acute renal failure superimposed on stage 3b chronic kidney disease (HCC) 07/01/2023   OAB (overactive bladder) 03/30/2023   Weakness 02/05/2023   Hypoxemia 02/01/2023   AKI (acute kidney injury) 02/01/2023   Elevated d-dimer 02/01/2023   SOB (shortness of breath) 01/31/2023   Chronic diastolic CHF (congestive heart failure) (HCC) 01/31/2023   Hypercalcemia 01/31/2023   Obesity (BMI 30-39.9) 01/31/2023   Pancreatic cyst 05/22/2022   Primary open angle glaucoma (POAG) of left eye, indeterminate stage 05/22/2022   Skin lesion 05/22/2022   Osteopenia of multiple sites 02/17/2022   Non-seasonal allergic rhinitis 11/18/2021   Tremor of unknown origin 11/18/2021  Restless leg syndrome 06/28/2021   Lumbago with sciatica, right side 06/24/2020   Calculus of gallbladder without cholecystitis without obstruction 12/09/2019   Hypertensive heart disease with heart failure (HCC) 12/09/2019   Other chronic pain 12/09/2019   Type 2 diabetes mellitus with diabetic polyneuropathy (HCC) 12/09/2019   Lymphedema 09/03/2018   Multinodular goiter 07/31/2018   Aortic atherosclerosis 07/27/2018   Chronic renal insufficiency, stage 3 (moderate) 04/19/2018   Pulmonary nodule 04/18/2018   Chronic systolic heart failure (HCC) 03/08/2018   History of nephrolithiasis 01/23/2018   Hypomagnesemia 01/19/2018   Person encountering health services to consult on behalf of another person 01/14/2018   Thrombocytopenia 11/03/2017   Unspecified  dementia, unspecified severity, without behavioral disturbance, psychotic disturbance, mood disturbance, and anxiety (HCC) 07/30/2017   Urinary incontinence in female 02/23/2017   High risk medication use 12/11/2015   Left lumbar radiculopathy 12/05/2014   Anemia, unspecified 06/12/2014   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   Type 2 diabetes w unsp diabetic rtnop w/o macular edema (HCC) 06/12/2014   Benign essential hypertension 06/12/2014   Hyperlipidemia 06/12/2014   Obstructive sleep apnea 06/12/2014   Peripheral neuropathy 06/12/2014   Proteinuria 06/12/2014   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HCC) 06/12/2014   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) 06/12/2014   Vitamin B 12 deficiency 06/12/2014   Vitamin D deficiency 06/12/2014   Hypertension associated with type 2 diabetes mellitus (HCC) 06/12/2014   Iron deficiency anemia due to chronic blood loss 06/12/2014   PCP:  Steva Clotilda DEL, NP Pharmacy:   [facility] [address]), [place] - [address] [place]) [place] [address] Phone: [phone] Fax: [phone]     Social Drivers of Health (SDOH) Social History: SDOH Screenings   Food Insecurity: No Food Insecurity (08/18/2024)  Housing: Unknown (08/18/2024)  Transportation Needs: No Transportation Needs (08/18/2024)  Utilities: Not At Risk (08/18/2024)  Alcohol Screen: Low Risk  (12/20/2018)  Financial Resource Strain: Low Risk  (03/01/2024)   Received from [facility]  Physical Activity: Inactive (03/19/2021)   Received from [facility]  Social Connections: Socially Isolated (08/18/2024)  Stress: No Stress Concern Present (03/19/2021)   Received from [facility]  Tobacco Use: Low Risk  (09/15/2024)   SDOH Interventions:     Readmission Risk Interventions     No data to display

## 2024-09-17 NOTE — Progress Notes (Signed)
 [facility] Progress Note  Patient: Shelby [name]    [name]  DOA: 09/15/2024     Date of Service: the patient was seen and examined on 09/17/2024  Chief Complaint  Patient presents with   Altered Mental Status   Weakness   Brief hospital course: [name] is a 83 y.o. female with medical history significant of Hyperparathyroidism, dementia, heart failure with reduced ejection fraction, anemia of chronic disease, dilated cardiomyopathy, type 2 diabetes, essential hypertension, hyperlipidemia, obstructive sleep apnea, peripheral neuropathy, chronic kidney disease stage III, history of seizure-like activity and urinary incontinence who was brought in from [facility] by family wanting her to be checked.  Patient was recently diagnosed with UTI pneumonia and CHF exacerbation.  She has been taking medications at the facility.  Family visited and noticed she is more confused.  Patient is currently unable to give history.  She also altered.  She responds to voice by opening her eyes.  Workup performed showed significant change from last month.  Her sodium is 150, calcium  14.2 and phosphorus only 1.  Patient also has known history of chronic kidney disease creatinine is 1.87.  Head CT without contrast showed no acute findings.  Nephrology consulted and recommends admission for treatment of hypercalcemia and hyponatremia.    Assessment and Plan:  # Acute metabolic encephalopathy: Most likely multifactorial.  Hypercalcemia and hypernatremia. UA negative  Continue supportive care, delirium precautions Fall precautions   # Hypercalcemia: Due to known history of hyperparathyroidism Continue to monitor on telemetry Continue Sensipar, patient was on it due to known history of hyperparathyroidism Continue IVF D5 0.45 saline for hydration Nephrology consulted, pamidronate order placed Monitor BMP daily Ca 12.4    # Hypernatremia: Most likely due to  dehydration.  Aggressively hydrate and monitor   # Hypophosphatemia secondary to nutritional deficiency. Phos repleted. Check electrolytes daily  # type 2 diabetes: Initiate sliding scale insulin .   # Essential hypertension: Off medications at this time Continue to monitor BP and titrate medications accordingly    # Acute on chronic kidney disease stage IIIb: Renal function appears to be at baseline.  Continue to monitor Bladder scan negative for urinary retention Ultrasound renal pending   # hyperlipidemia: Will resume statin when stable.    # dilated cardiomyopathy: Appears compensated.  Hold off medications for now, resume when patient is stable.   # dementia: Currently obtunded. # obstructive sleep apnea: Consider CPAP at night.  # Obesity class II Body mass index is 36.25 kg/m.  Interventions: Calorie restricted diet and daily exercise will be advised to lose body weight.  Lifestyle modification needs to be discussed with patient has severe dementia    Wound 08/18/24 1821 Pressure Injury Buttocks Mid Stage 2 -  Partial thickness loss of dermis presenting as a shallow open injury with a red, pink wound bed without slough. (Active)     Diet: Dysphagia 3 diet DVT Prophylaxis: Subcutaneous Heparin     Advance goals of care discussion: Full code  Family Communication: family was present at bedside, at the time of interview.  The pt provided permission to discuss medical plan with the family. Opportunity was given to ask question and all questions were answered satisfactorily.   Disposition:  Pt is from SNF, admitted with AME due to hypercalcemia, still has elevated calcium  level, which precludes a safe discharge. Discharge to SNF, when stable, may need few days to improve.  Subjective: No significant event overnight, patient resting fully, she has significant dementia AO  x 1, knows her name only.  Denies any complaints, resting comfortably, no acute distress  noticed.   Physical Exam: General: NAD, lying comfortably Appear in no distress, affect appropriate Eyes: PERRLA ENT: Oral Mucosa Clear, moist  Neck: no JVD,  Cardiovascular: S1 and S2 Present, no Murmur,  Respiratory: good respiratory effort, Bilateral Air entry equal and Decreased, no Crackles, no wheezes Abdomen: Bowel Sound present, Soft and no tenderness,  Skin: no rashes Extremities: no Pedal edema, no calf tenderness Neurologic: without any new focal findings Gait not checked due to patient safety concerns  Vitals:   09/16/24 1616 09/16/24 1944 09/17/24 0455 09/17/24 0854  BP: (!) 104/56 104/64 106/71 108/72  Pulse: 73 71 73 74  Resp: 16  20 16   Temp: 98 F (36.7 C) 97.8 F (36.6 C) 98.6 F (37 C) 98.1 F (36.7 C)  TempSrc: Oral Oral Oral   SpO2: 100% 96% 100% 100%  Weight:        Intake/Output Summary (Last 24 hours) at 09/17/2024 1412 Last data filed at 09/17/2024 1300 Gross per 24 hour  Intake 0 ml  Output 300 ml  Net -300 ml   Filed Weights   09/16/24 0800  Weight: 84.2 kg    Data Reviewed: I have personally reviewed and interpreted daily labs, tele strips, imagings as discussed above. I reviewed all nursing notes, pharmacy notes, vitals, pertinent old records I have discussed plan of care as described above with RN and patient/family.  CBC: Recent Labs  Lab 09/15/24 1440 09/16/24 0500 09/17/24 0541  WBC 8.0 6.7 5.6  HGB 13.5 13.9 12.4  HCT 44.5 44.7 38.4  MCV 98.7 97.4 96.0  PLT 199 156 144*   Basic Metabolic Panel: Recent Labs  Lab 09/15/24 1440 09/16/24 0500 09/16/24 1612 09/16/24 2100 09/17/24 0541 09/17/24 0746  NA 150* 150* 148* 147* 148* 149*  K 4.1 3.7 3.9 3.4* 3.2* 3.5  CL 106 104 106 105 104 104  CO2 36* 32 29 32 32 34*  GLUCOSE 121* 157* 155* 150* 102* 102*  BUN 44* 46* 39* 40* 39* 38*  CREATININE 1.87* 1.65* 1.47* 1.56* 1.52* 1.61*  CALCIUM  14.2* 13.5* 12.8* 12.6* 12.3* 12.4*  MG  --  2.3  --   --  2.2  --   PHOS  1.0* 1.5*  --   --  2.4*  --     Studies: MR ABDOMEN W WO CONTRAST Result Date: 09/17/2024 CLINICAL DATA:  Right renal mass, patient unable to hold breath on inspiration EXAM: MRI ABDOMEN WITHOUT AND WITH CONTRAST TECHNIQUE: Multiplanar multisequence MR imaging of the abdomen was performed both before and after the administration of intravenous contrast. CONTRAST:  8mL GADAVIST  GADOBUTROL  1 MMOL/ML IV SOLN COMPARISON:  MRI August 21, 2022. FINDINGS: Despite efforts by the technologist and patient, motion artifact is present on today's exam and could not be eliminated. This reduces exam sensitivity and specificity. Lower chest: Heterogeneous signal in the bilateral lung bases likely reflects atelectasis. Hepatobiliary: No significant hepatic steatosis or iron deposition. Nonenhancing lesion in the hepatic dome not well evaluated on today's motion degraded examination. Sludge filled gallbladder is distended without wall thickening or pericholecystic fluid. No biliary ductal dilation. Pancreas: No pancreatic ductal dilation or evidence of acute inflammation. Stable cystic lesion in the pancreatic head measuring 7 mm on image 26/24. Spleen:  No splenomegaly. Adrenals/Urinary Tract: The previously indexed 9 mm enhancing lesion off the posterior interpolar right kidney which measured 9 mm on image 57/15 on MRI August 21, 2022, 9 mm on MRI April 01, 2021 on image 59/19 and 8 mm when remeasured for consistency on MRI April 03, 2020 on image 52/14 of that examination. On today's examination the lesion is likely identified on image 57/19, 59/17 and image 26/24 measuring 7 mm. Numerous bilateral renal lesions of varying degrees of complexity some of which are intrinsically T1 hyperintense and some which are intrinsically T2 hypointense, but are poorly evaluated on this examination which is significantly degraded by motion. Stomach/Bowel: Stomach is nondistended. No evidence of bowel obstruction. Vascular/Lymphatic:  Normal caliber abdominal aorta. No pathologically enlarged abdominal or pelvic lymph nodes. Other:  No significant abdominal free fluid. Musculoskeletal: No suspicious osseous lesion. IMPRESSION: Severely motion degraded examination limits sensitivity and specificity. Within this context: 1. The previously indexed 9 mm enhancing lesion off the posterior interpolar right kidney is not well evaluated on today's examination but appears grossly stable in size. Suggest continued surveillance imaging in short interval, in 6 months, upon resolution of patient's current symptomatology when they are better able to follow commands and maintain breath hold. 2. Numerous bilateral renal lesions of varying degrees of complexity some of which are intrinsically T1 hyperintense are poorly evaluated on this examination which is significantly degraded by motion. Suggest attention on follow-up imaging. 3. 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 Recommend attention on follow-up MRI. 4. Sludge filled gallbladder is distended without wall thickening or pericholecystic fluid, suggest correlation for right upper quadrant tenderness to palpation. Electronically Signed   By: Reyes Holder M.D.   On: 09/17/2024 08:26    Scheduled Meds:  cinacalcet  30 mg Oral Q breakfast   heparin   5,000 Units Subcutaneous Q8H   insulin  aspart  0-15 Units Subcutaneous TID WC   insulin  aspart  0-5 Units Subcutaneous QHS   phosphorus  500 mg Oral QID   Continuous Infusions:  dextrose 5 % and 0.45 % NaCl 100 mL/hr at 09/17/24 1317   PRN Meds: acetaminophen  **OR** acetaminophen , HYDROcodone-acetaminophen , ondansetron  **OR** ondansetron  (ZOFRAN ) IV  Time spent: 55 minutes  Author: ELVAN SOR. MD [facility] 09/17/2024 2:12 PM  To reach On-call, see care teams to locate the attending and reach out to them via [email or web address]. If 7PM-7AM, please contact night-coverage If you still have  difficulty reaching the attending provider, please page the [facility] (Director on Call) for [facility] on amion for assistance.

## 2024-09-17 NOTE — [place] FL2 (Signed)
 [place]  MEDICAID FL2 LEVEL OF CARE FORM     IDENTIFICATION  Patient Name: Shelby [name] Birthdate: Jul 27, 1941 Sex: female Admission Date (Current Location): 09/15/2024  [facility] and [place] Number:  Chiropodist and Address:  [facility], [address] [place], [place] [address]      Provider Number: [id number]  Attending Physician Name and Address:  Von Bellis, MD  Relative Name and Phone Number:  Christionna Poland [phone]    Current Level of Care: Hospital Recommended Level of Care: Skilled Nursing Facility Prior Approval Number:    Date Approved/Denied:   PASRR Number: [id number]  Discharge Plan: SNF    Current Diagnoses: Patient Active Problem List   Diagnosis Date Noted   Acute metabolic encephalopathy 09/15/2024   Hypernatremia 09/15/2024   Urinary tract infection 08/18/2024   Problem related to unspecified psychosocial circumstances 07/21/2024   Seizure-like activity (HCC) 03/01/2024   Hyperparathyroidism, primary 08/04/2023   Chronic idiopathic constipation 07/21/2023   Generalized anxiety disorder 07/21/2023   Syncope 07/01/2023   Type II diabetes mellitus with renal manifestations (HCC) 07/01/2023   HLD (hyperlipidemia) 07/01/2023   Acute renal failure superimposed on stage 3b chronic kidney disease (HCC) 07/01/2023   OAB (overactive bladder) 03/30/2023   Weakness 02/05/2023   Hypoxemia 02/01/2023   AKI (acute kidney injury) 02/01/2023   Elevated d-dimer 02/01/2023   SOB (shortness of breath) 01/31/2023   Chronic diastolic CHF (congestive heart failure) (HCC) 01/31/2023   Hypercalcemia 01/31/2023   Obesity (BMI 30-39.9) 01/31/2023   Pancreatic cyst 05/22/2022   Primary open angle glaucoma (POAG) of left eye, indeterminate stage 05/22/2022   Skin lesion 05/22/2022   Osteopenia of multiple sites 02/17/2022   Non-seasonal allergic rhinitis 11/18/2021   Tremor of unknown origin 11/18/2021   Restless  leg syndrome 06/28/2021   Lumbago with sciatica, right side 06/24/2020   Calculus of gallbladder without cholecystitis without obstruction 12/09/2019   Hypertensive heart disease with heart failure (HCC) 12/09/2019   Other chronic pain 12/09/2019   Type 2 diabetes mellitus with diabetic polyneuropathy (HCC) 12/09/2019   Lymphedema 09/03/2018   Multinodular goiter 07/31/2018   Aortic atherosclerosis 07/27/2018   Chronic renal insufficiency, stage 3 (moderate) 04/19/2018   Pulmonary nodule 04/18/2018   Chronic systolic heart failure (HCC) 03/08/2018   History of nephrolithiasis 01/23/2018   Hypomagnesemia 01/19/2018   Person encountering health services to consult on behalf of another person 01/14/2018   Thrombocytopenia 11/03/2017   Unspecified dementia, unspecified severity, without behavioral disturbance, psychotic disturbance, mood disturbance, and anxiety (HCC) 07/30/2017   Urinary incontinence in female 02/23/2017   High risk medication use 12/11/2015   Left lumbar radiculopathy 12/05/2014   Anemia, unspecified 06/12/2014   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   Type 2 diabetes w unsp diabetic rtnop w/o macular edema (HCC) 06/12/2014   Benign essential hypertension 06/12/2014   Hyperlipidemia 06/12/2014   Obstructive sleep apnea 06/12/2014   Peripheral neuropathy 06/12/2014   Proteinuria 06/12/2014   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HCC) 06/12/2014   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) 06/12/2014   Vitamin B 12 deficiency 06/12/2014   Vitamin D deficiency 06/12/2014   Hypertension associated with type 2 diabetes mellitus (HCC) 06/12/2014   Iron deficiency anemia due to chronic blood loss 06/12/2014    Orientation RESPIRATION BLADDER Height & Weight     Self, Place  Normal Incontinent Weight: 185 lb 10 oz (84.2 kg) Height:     BEHAVIORAL SYMPTOMS/MOOD  NEUROLOGICAL BOWEL NUTRITION STATUS      Incontinent Diet   AMBULATORY STATUS COMMUNICATION OF NEEDS Skin   Extensive Assist Verbally Surgical wounds                       Personal Care Assistance Level of Assistance  Total care   Feeding assistance: Limited assistance Dressing Assistance: Maximum assistance Total Care Assistance: Maximum assistance   Functional Limitations Info  Sight, Hearing, Speech Sight Info: Adequate Hearing Info: Impaired Speech Info: Adequate    SPECIAL CARE FACTORS FREQUENCY  PT (By licensed PT), OT (By licensed OT)     PT Frequency: 1x OT Frequency: 2x            Contractures Contractures Info: Not present    Additional Factors Info  Code Status, Allergies Code Status Info: FULL Allergies Info: Furosemide            Current Medications (09/17/2024):  This is the current hospital active medication list Current Facility-Administered Medications  Medication Dose Route Frequency Provider Last Rate Last Admin   acetaminophen  (TYLENOL ) tablet 650 mg  650 mg Oral Q6H PRN Duncan, Hazel V, MD       Or   acetaminophen  (TYLENOL ) suppository 650 mg  650 mg Rectal Q6H PRN Duncan, Hazel V, MD       cinacalcet (SENSIPAR) tablet 30 mg  30 mg Oral Q breakfast Breeze, Faith, NP   30 mg at 09/17/24 0916   dextrose 5 % and 0.45 % NaCl infusion   Intravenous Continuous Von Bellis, MD 100 mL/hr at 09/17/24 1317 New Bag at 09/17/24 1317   heparin  injection 5,000 Units  5,000 Units Subcutaneous Q8H Sim Re L, MD   5,000 Units at 09/17/24 9372   HYDROcodone-acetaminophen  (NORCO/VICODIN) 5-325 MG per tablet 1-2 tablet  1-2 tablet Oral Q4H PRN Duncan, Hazel V, MD       insulin  aspart (novoLOG ) injection 0-15 Units  0-15 Units Subcutaneous TID WC Garba, Mohammad L, MD   2 Units at 09/16/24 1655   insulin  aspart (novoLOG ) injection 0-5 Units  0-5 Units Subcutaneous QHS Garba, Mohammad L, MD       ondansetron  (ZOFRAN ) tablet 4 mg  4 mg Oral Q6H PRN Sim Re CROME, MD       Or   ondansetron  (ZOFRAN )  injection 4 mg  4 mg Intravenous Q6H PRN Garba, Mohammad L, MD         Discharge Medications: Please see discharge summary for a list of discharge medications.  Relevant Imaging Results:  Relevant Lab Results:   Additional Information [id number]  [place] L Lonnetta Kniskern, LCSW

## 2024-09-17 NOTE — Plan of Care (Signed)
  Problem: Skin Integrity: Goal: Risk for impaired skin integrity will decrease Outcome: Progressing   Problem: Clinical Measurements: Goal: Will remain free from infection Outcome: Progressing   Problem: Pain Managment: Goal: General experience of comfort will improve and/or be controlled Outcome: Progressing   Problem: Fluid Volume: Goal: Ability to maintain a balanced intake and output will improve Outcome: Not Progressing Pt with very poor PO intake. Refusing meals.    Problem: Health Behavior/Discharge Planning: Goal: Ability to manage health-related needs will improve Outcome: Not Progressing   Problem: Metabolic: Goal: Ability to maintain appropriate glucose levels will improve Outcome: Not Progressing   BG trending downward throughout the day.  MD notified and IVF changed to d5 0.45NS. Problem: Nutritional: Goal: Maintenance of adequate nutrition will improve Outcome: Not Progressing

## 2024-09-17 NOTE — Plan of Care (Signed)
  Problem: Coping: Goal: Ability to adjust to condition or change in health will improve Outcome: Progressing   Problem: Fluid Volume: Goal: Ability to maintain a balanced intake and output will improve Outcome: Progressing   Problem: Metabolic: Goal: Ability to maintain appropriate glucose levels will improve Outcome: Progressing   Problem: Skin Integrity: Goal: Risk for impaired skin integrity will decrease Outcome: Progressing   Problem: Clinical Measurements: Goal: Will remain free from infection Outcome: Progressing

## 2024-09-17 NOTE — Progress Notes (Signed)
 [facility]  ROUNDING NOTE   Subjective:  Shelby [name] is a 83 y.o. female with past medical history including diabetes, hypertension, proteinuria, and hypercalcemia. Patient presents to the ED with altered mental status from SNF and has been admitted for Hypercalcemia [E83.52]   Patient is known to our practice and is followed by Dr. Marcelino.  Patient was last seen in office in July of this year.  Update: Patient seen and evaluated at bedside. Patient alert. No complaints to offer.    Objective:  Vital signs in last 24 hours:  Temp:  [97.8 F (36.6 C)-98.6 F (37 C)] 98.1 F (36.7 C) (10/11 0854) Pulse Rate:  [71-74] 74 (10/11 0854) Resp:  [16-20] 16 (10/11 0854) BP: (104-108)/(56-72) 108/72 (10/11 0854) SpO2:  [96 %-100 %] 100 % (10/11 0854)  Weight change:  Filed Weights   09/16/24 0800  Weight: 84.2 kg    Intake/Output: I/O last 3 completed shifts: In: 647.5 [I.V.:647.5] Out: 700 [Urine:700]   Intake/Output this shift:  No intake/output data recorded.  Physical Exam: General: NAD  Head: Normocephalic  Eyes: Anicteric  Neck: Supple  Lungs:  Clear, Room air  Heart: Regular   Abdomen:  Soft  Extremities:  No peripheral edema.  Neurologic: Alert  Skin: No lesions  Access: None    Basic Metabolic Panel: Recent Labs  Lab 09/15/24 1440 09/16/24 0500 09/16/24 1612 09/16/24 2100 09/17/24 0541 09/17/24 0746  NA 150* 150* 148* 147* 148* 149*  K 4.1 3.7 3.9 3.4* 3.2* 3.5  CL 106 104 106 105 104 104  CO2 36* 32 29 32 32 34*  GLUCOSE 121* 157* 155* 150* 102* 102*  BUN 44* 46* 39* 40* 39* 38*  CREATININE 1.87* 1.65* 1.47* 1.56* 1.52* 1.61*  CALCIUM  14.2* 13.5* 12.8* 12.6* 12.3* 12.4*  MG  --  2.3  --   --  2.2  --   PHOS 1.0* 1.5*  --   --  2.4*  --     Liver Function Tests: Recent Labs  Lab 09/15/24 1440 09/16/24 0500  AST 37 37  ALT 26 23  ALKPHOS 59 61  BILITOT 0.7 0.6  PROT 7.3 6.8  ALBUMIN  3.4* 3.2*   No results for  input(s): LIPASE, AMYLASE in the last 168 hours. No results for input(s): AMMONIA in the last 168 hours.  CBC: Recent Labs  Lab 09/15/24 1440 09/16/24 0500 09/17/24 0541  WBC 8.0 6.7 5.6  HGB 13.5 13.9 12.4  HCT 44.5 44.7 38.4  MCV 98.7 97.4 96.0  PLT 199 156 144*    Cardiac Enzymes: No results for input(s): CKTOTAL, CKMB, CKMBINDEX, TROPONINI in the last 168 hours.  BNP: Invalid input(s): POCBNP  CBG: Recent Labs  Lab 09/16/24 1132 09/16/24 1618 09/16/24 1936 09/17/24 0008 09/17/24 0857  GLUCAP 95 126* 150* 115* 86    Microbiology: Results for orders placed or performed during the hospital encounter of 08/18/24  Resp panel by RT-PCR (RSV, Flu A&B, Covid) Anterior Nasal Swab     Status: None   Collection Time: 08/18/24  1:12 PM   Specimen: Anterior Nasal Swab  Result Value Ref Range Status   SARS Coronavirus 2 by RT PCR NEGATIVE NEGATIVE Final    Comment: (NOTE) SARS-CoV-2 target nucleic acids are NOT DETECTED.  The SARS-CoV-2 RNA is generally detectable in upper respiratory specimens during the acute phase of infection. The lowest concentration of SARS-CoV-2 viral copies this assay can detect is 138 copies/mL. A negative result does not preclude SARS-Cov-2 infection  and should not be used as the sole basis for treatment or other patient management decisions. A negative result may occur with  improper specimen collection/handling, submission of specimen other than nasopharyngeal swab, presence of viral mutation(s) within the areas targeted by this assay, and inadequate number of viral copies(<138 copies/mL). A negative result must be combined with clinical observations, patient history, and epidemiological information. The expected result is Negative.  Fact Sheet for Patients:  BloggerCourse.com  Fact Sheet for Healthcare Providers:  SeriousBroker.it  This test is no t yet approved or  cleared by the [place]  FDA and  has been authorized for detection and/or diagnosis of SARS-CoV-2 by FDA under an Emergency Use Authorization (EUA). This EUA will remain  in effect (meaning this test can be used) for the duration of the COVID-19 declaration under Section 564(b)(1) of the Act, 21 U.S.C.section 360bbb-3(b)(1), unless the authorization is terminated  or revoked sooner.       Influenza A by PCR NEGATIVE NEGATIVE Final   Influenza B by PCR NEGATIVE NEGATIVE Final    Comment: (NOTE) The Xpert Xpress SARS-CoV-2/FLU/RSV plus assay is intended as an aid in the diagnosis of influenza from Nasopharyngeal swab specimens and should not be used as a sole basis for treatment. Nasal washings and aspirates are unacceptable for Xpert Xpress SARS-CoV-2/FLU/RSV testing.  Fact Sheet for Patients: BloggerCourse.com  Fact Sheet for Healthcare Providers: SeriousBroker.it  This test is not yet approved or cleared by the [place]  FDA and has been authorized for detection and/or diagnosis of SARS-CoV-2 by FDA under an Emergency Use Authorization (EUA). This EUA will remain in effect (meaning this test can be used) for the duration of the COVID-19 declaration under Section 564(b)(1) of the Act, 21 U.S.C. section 360bbb-3(b)(1), unless the authorization is terminated or revoked.     Resp Syncytial Virus by PCR NEGATIVE NEGATIVE Final    Comment: (NOTE) Fact Sheet for Patients: BloggerCourse.com  Fact Sheet for Healthcare Providers: SeriousBroker.it  This test is not yet approved or cleared by the [place]  FDA and has been authorized for detection and/or diagnosis of SARS-CoV-2 by FDA under an Emergency Use Authorization (EUA). This EUA will remain in effect (meaning this test can be used) for the duration of the COVID-19 declaration under Section 564(b)(1) of the Act, 21  U.S.C. section 360bbb-3(b)(1), unless the authorization is terminated or revoked.  Performed at [facility], [address] [place], [place] [address]   Urine Culture     Status: Abnormal   Collection Time: 08/18/24  1:12 PM   Specimen: Urine, Catheterized  Result Value Ref Range Status   Specimen Description   Final    URINE, CATHETERIZED Performed at [facility], [address], [place], [place] [address]    Special Requests   Final    NONE Performed at [facility], [address] [place], [place] [address]    Culture (A)  Final    >=100,000 COLONIES/mL KLEBSIELLA PNEUMONIAE >=100,000 COLONIES/mL ESCHERICHIA COLI    Report Status 08/21/2024 FINAL  Final   Organism ID, Bacteria KLEBSIELLA PNEUMONIAE (A)  Final   Organism ID, Bacteria ESCHERICHIA COLI (A)  Final      Susceptibility   Escherichia coli - MIC*    AMPICILLIN <=2 SENSITIVE Sensitive     CEFAZOLIN (URINE) Value in next row Sensitive      <=1 SENSITIVEThis is a modified FDA-approved test that has been validated and its performance characteristics determined by the reporting laboratory.  This  laboratory is certified under the Clinical Laboratory Improvement Amendments CLIA as qualified to perform high complexity clinical laboratory testing.    CEFEPIME Value in next row Sensitive      <=1 SENSITIVEThis is a modified FDA-approved test that has been validated and its performance characteristics determined by the reporting laboratory.  This laboratory is certified under the Clinical Laboratory Improvement Amendments CLIA as qualified to perform high complexity clinical laboratory testing.    ERTAPENEM Value in next row Sensitive      <=1 SENSITIVEThis is a modified FDA-approved test that has been validated and its performance characteristics determined by the reporting laboratory.  This laboratory is certified under the Clinical Laboratory Improvement Amendments CLIA as qualified to perform  high complexity clinical laboratory testing.    CEFTRIAXONE  Value in next row Sensitive      <=1 SENSITIVEThis is a modified FDA-approved test that has been validated and its performance characteristics determined by the reporting laboratory.  This laboratory is certified under the Clinical Laboratory Improvement Amendments CLIA as qualified to perform high complexity clinical laboratory testing.    CIPROFLOXACIN Value in next row Sensitive      <=1 SENSITIVEThis is a modified FDA-approved test that has been validated and its performance characteristics determined by the reporting laboratory.  This laboratory is certified under the Clinical Laboratory Improvement Amendments CLIA as qualified to perform high complexity clinical laboratory testing.    GENTAMICIN Value in next row Sensitive      <=1 SENSITIVEThis is a modified FDA-approved test that has been validated and its performance characteristics determined by the reporting laboratory.  This laboratory is certified under the Clinical Laboratory Improvement Amendments CLIA as qualified to perform high complexity clinical laboratory testing.    NITROFURANTOIN Value in next row Sensitive      <=1 SENSITIVEThis is a modified FDA-approved test that has been validated and its performance characteristics determined by the reporting laboratory.  This laboratory is certified under the Clinical Laboratory Improvement Amendments CLIA as qualified to perform high complexity clinical laboratory testing.    TRIMETH/SULFA Value in next row Sensitive      <=1 SENSITIVEThis is a modified FDA-approved test that has been validated and its performance characteristics determined by the reporting laboratory.  This laboratory is certified under the Clinical Laboratory Improvement Amendments CLIA as qualified to perform high complexity clinical laboratory testing.    AMPICILLIN/SULBACTAM Value in next row Sensitive      <=1 SENSITIVEThis is a modified FDA-approved test that  has been validated and its performance characteristics determined by the reporting laboratory.  This laboratory is certified under the Clinical Laboratory Improvement Amendments CLIA as qualified to perform high complexity clinical laboratory testing.    PIP/TAZO Value in next row Sensitive ug/mL     <=4 SENSITIVEThis is a modified FDA-approved test that has been validated and its performance characteristics determined by the reporting laboratory.  This laboratory is certified under the Clinical Laboratory Improvement Amendments CLIA as qualified to perform high complexity clinical laboratory testing.    MEROPENEM Value in next row Sensitive      <=4 SENSITIVEThis is a modified FDA-approved test that has been validated and its performance characteristics determined by the reporting laboratory.  This laboratory is certified under the Clinical Laboratory Improvement Amendments CLIA as qualified to perform high complexity clinical laboratory testing.    * >=100,000 COLONIES/mL ESCHERICHIA COLI   Klebsiella pneumoniae - MIC*    AMPICILLIN Value in next row Resistant      <=4  SENSITIVEThis is a modified FDA-approved test that has been validated and its performance characteristics determined by the reporting laboratory.  This laboratory is certified under the Clinical Laboratory Improvement Amendments CLIA as qualified to perform high complexity clinical laboratory testing.    CEFAZOLIN (URINE) Value in next row Sensitive      2 SENSITIVEThis is a modified FDA-approved test that has been validated and its performance characteristics determined by the reporting laboratory.  This laboratory is certified under the Clinical Laboratory Improvement Amendments CLIA as qualified to perform high complexity clinical laboratory testing.    CEFEPIME Value in next row Sensitive      2 SENSITIVEThis is a modified FDA-approved test that has been validated and its performance characteristics determined by the reporting  laboratory.  This laboratory is certified under the Clinical Laboratory Improvement Amendments CLIA as qualified to perform high complexity clinical laboratory testing.    ERTAPENEM Value in next row Sensitive      2 SENSITIVEThis is a modified FDA-approved test that has been validated and its performance characteristics determined by the reporting laboratory.  This laboratory is certified under the Clinical Laboratory Improvement Amendments CLIA as qualified to perform high complexity clinical laboratory testing.    CEFTRIAXONE  Value in next row Sensitive      2 SENSITIVEThis is a modified FDA-approved test that has been validated and its performance characteristics determined by the reporting laboratory.  This laboratory is certified under the Clinical Laboratory Improvement Amendments CLIA as qualified to perform high complexity clinical laboratory testing.    CIPROFLOXACIN Value in next row Sensitive      2 SENSITIVEThis is a modified FDA-approved test that has been validated and its performance characteristics determined by the reporting laboratory.  This laboratory is certified under the Clinical Laboratory Improvement Amendments CLIA as qualified to perform high complexity clinical laboratory testing.    GENTAMICIN Value in next row Sensitive      2 SENSITIVEThis is a modified FDA-approved test that has been validated and its performance characteristics determined by the reporting laboratory.  This laboratory is certified under the Clinical Laboratory Improvement Amendments CLIA as qualified to perform high complexity clinical laboratory testing.    NITROFURANTOIN Value in next row Intermediate      2 SENSITIVEThis is a modified FDA-approved test that has been validated and its performance characteristics determined by the reporting laboratory.  This laboratory is certified under the Clinical Laboratory Improvement Amendments CLIA as qualified to perform high complexity clinical laboratory testing.     TRIMETH/SULFA Value in next row Sensitive      2 SENSITIVEThis is a modified FDA-approved test that has been validated and its performance characteristics determined by the reporting laboratory.  This laboratory is certified under the Clinical Laboratory Improvement Amendments CLIA as qualified to perform high complexity clinical laboratory testing.    AMPICILLIN/SULBACTAM Value in next row Sensitive      2 SENSITIVEThis is a modified FDA-approved test that has been validated and its performance characteristics determined by the reporting laboratory.  This laboratory is certified under the Clinical Laboratory Improvement Amendments CLIA as qualified to perform high complexity clinical laboratory testing.    PIP/TAZO Value in next row Sensitive ug/mL     <=4 SENSITIVEThis is a modified FDA-approved test that has been validated and its performance characteristics determined by the reporting laboratory.  This laboratory is certified under the Clinical Laboratory Improvement Amendments CLIA as qualified to perform high complexity clinical laboratory testing.    MEROPENEM Value in next  row Sensitive      <=4 SENSITIVEThis is a modified FDA-approved test that has been validated and its performance characteristics determined by the reporting laboratory.  This laboratory is certified under the Clinical Laboratory Improvement Amendments CLIA as qualified to perform high complexity clinical laboratory testing.    * >=100,000 COLONIES/mL KLEBSIELLA PNEUMONIAE  Culture, blood (Routine X 2) w Reflex to ID Panel     Status: None   Collection Time: 08/18/24  3:45 PM   Specimen: BLOOD  Result Value Ref Range Status   Specimen Description BLOOD BLOOD RIGHT HAND  Final   Special Requests   Final    BOTTLES DRAWN AEROBIC AND ANAEROBIC Blood Culture results may not be optimal due to an inadequate volume of blood received in culture bottles   Culture   Final    NO GROWTH 5 DAYS Performed at [facility],  [address] [place], [place] [address]    Report Status 08/23/2024 FINAL  Final  Culture, blood (Routine X 2) w Reflex to ID Panel     Status: None   Collection Time: 08/18/24  3:45 PM   Specimen: BLOOD  Result Value Ref Range Status   Specimen Description BLOOD BLOOD LEFT HAND  Final   Special Requests   Final    AEROBIC BOTTLE ONLY Blood Culture results may not be optimal due to an inadequate volume of blood received in culture bottles   Culture   Final    NO GROWTH 5 DAYS Performed at [facility], [address], [place], [place] [address]    Report Status 08/23/2024 FINAL  Final    Coagulation Studies: No results for input(s): LABPROT, INR in the last 72 hours.  Urinalysis: Recent Labs    09/16/24 1205  COLORURINE YELLOW*  LABSPEC 1.021  PHURINE 6.0  GLUCOSEU >=500*  HGBUR NEGATIVE  BILIRUBINUR NEGATIVE  KETONESUR NEGATIVE  PROTEINUR NEGATIVE  NITRITE NEGATIVE  LEUKOCYTESUR NEGATIVE      Imaging: MR ABDOMEN W WO CONTRAST Result Date: 09/17/2024 CLINICAL DATA:  Right renal mass, patient unable to hold breath on inspiration EXAM: MRI ABDOMEN WITHOUT AND WITH CONTRAST TECHNIQUE: Multiplanar multisequence MR imaging of the abdomen was performed both before and after the administration of intravenous contrast. CONTRAST:  8mL GADAVIST  GADOBUTROL  1 MMOL/ML IV SOLN COMPARISON:  MRI August 21, 2022. FINDINGS: Despite efforts by the technologist and patient, motion artifact is present on today's exam and could not be eliminated. This reduces exam sensitivity and specificity. Lower chest: Heterogeneous signal in the bilateral lung bases likely reflects atelectasis. Hepatobiliary: No significant hepatic steatosis or iron deposition. Nonenhancing lesion in the hepatic dome not well evaluated on today's motion degraded examination. Sludge filled gallbladder is distended without wall thickening or pericholecystic fluid. No biliary ductal dilation. Pancreas: No  pancreatic ductal dilation or evidence of acute inflammation. Stable cystic lesion in the pancreatic head measuring 7 mm on image 26/24. Spleen:  No splenomegaly. Adrenals/Urinary Tract: The previously indexed 9 mm enhancing lesion off the posterior interpolar right kidney which measured 9 mm on image 57/15 on MRI August 21, 2022, 9 mm on MRI April 01, 2021 on image 59/19 and 8 mm when remeasured for consistency on MRI April 03, 2020 on image 52/14 of that examination. On today's examination the lesion is likely identified on image 57/19, 59/17 and image 26/24 measuring 7 mm. Numerous bilateral renal lesions of varying degrees of complexity some of which are intrinsically T1 hyperintense and some which are intrinsically T2  hypointense, but are poorly evaluated on this examination which is significantly degraded by motion. Stomach/Bowel: Stomach is nondistended. No evidence of bowel obstruction. Vascular/Lymphatic: Normal caliber abdominal aorta. No pathologically enlarged abdominal or pelvic lymph nodes. Other:  No significant abdominal free fluid. Musculoskeletal: No suspicious osseous lesion. IMPRESSION: Severely motion degraded examination limits sensitivity and specificity. Within this context: 1. The previously indexed 9 mm enhancing lesion off the posterior interpolar right kidney is not well evaluated on today's examination but appears grossly stable in size. Suggest continued surveillance imaging in short interval, in 6 months, upon resolution of patient's current symptomatology when they are better able to follow commands and maintain breath hold. 2. Numerous bilateral renal lesions of varying degrees of complexity some of which are intrinsically T1 hyperintense are poorly evaluated on this examination which is significantly degraded by motion. Suggest attention on follow-up imaging. 3. 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 Recommend attention on follow-up  MRI. 4. Sludge filled gallbladder is distended without wall thickening or pericholecystic fluid, suggest correlation for right upper quadrant tenderness to palpation. Electronically Signed   By: Reyes Holder M.D.   On: 09/17/2024 08:26   CT Head Wo Contrast Result Date: 09/15/2024 CLINICAL DATA:  Altered mental status. EXAM: CT HEAD WITHOUT CONTRAST TECHNIQUE: Contiguous axial images were obtained from the base of the skull through the vertex without intravenous contrast. RADIATION DOSE REDUCTION: This exam was performed according to the departmental dose-optimization program which includes automated exposure control, adjustment of the mA and/or kV according to patient size and/or use of iterative reconstruction technique. COMPARISON:  07/21/2024 FINDINGS: Brain: No intracranial hemorrhage, mass effect, or midline shift. Stable atrophy. No hydrocephalus. The basilar cisterns are patent. Stable chronic small vessel ischemia. No evidence of territorial infarct or acute ischemia. No extra-axial or intracranial fluid collection. Vascular: Atherosclerosis of skullbase vasculature without hyperdense vessel or abnormal calcification. Skull: No fracture or focal lesion. Sinuses/Orbits: No acute finding. Other: None. IMPRESSION: 1. No acute intracranial abnormality. 2. Stable atrophy and chronic small vessel ischemia. Electronically Signed   By: Andrea Gasman M.D.   On: 09/15/2024 18:10     Medications:    sodium chloride       cinacalcet  30 mg Oral Q breakfast   heparin   5,000 Units Subcutaneous Q8H   insulin  aspart  0-15 Units Subcutaneous TID WC   insulin  aspart  0-5 Units Subcutaneous QHS   acetaminophen  **OR** acetaminophen , HYDROcodone-acetaminophen , ondansetron  **OR** ondansetron  (ZOFRAN ) IV  Assessment/ Plan:  [name] is a 83 y.o.  female  with past medical history including diabetes, hypertension, proteinuria, and hypercalcemia. Patient presents to the ED with altered  mental status from SNF and has been admitted for Hypercalcemia [E83.52]     Severe hypercalcemia, serum calcium  14.2 on ED arrival with phosphorus 1.0. Prescribed Cinacalcet outpatient which can cause elevated calcium  levels. Pamidronate 90mg  given yesterday. Calcium  today 12.4. Will start IV hydration NS 100mL/hr   2. 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  noninsulin dependent.  Most recent hemoglobin A1c is 5.9 on 05/19/24. Baseline GFR 20s.MRI ordered to follow-up the prior renal mass noted back in 2023.    3. Hypertension with chronic kidney disease. Home regimen includes carvedilol , amlodipine , and torsemide . All currently held. BP 108/72 today.   LOS: 2 Shahida Schnackenberg P Sherene Plancarte 10/11/20259:00 AM

## 2024-09-17 NOTE — Consult Note (Signed)
 Consultation Note Date: 09/17/2024 at 0834  Patient Name: Shelby [name]  DOB: Apr 01, 1941  MRN: [id number]  Age / Sex: 83 y.o., female  PCP: Steva Clotilda DEL, NP Referring Physician: Von Bellis, MD  HPI/Patient Profile: 83 y.o. female  with past medical history significant for dementia, HFrEF, anemia of chronic disease, dilated cardiomyopathy, hyperparathyroidism, DM II, essential hypertension, HLD, OSA, peripheral neuropathy, CKD stage III, history of seizure-like activity and urinary incontinence. Patient presented to ED 09/15/2024 c/o AMS and weakness. Patient family reported in ED that patient was recently treated for UTI, PNA and CHF exacerbation. They requested evaluation due to being more confused than normal.   ED labs significant for sodium 150, CO2 36, glucose 121, BUN 44, creatinine 1.87, calcium  14.2, albumin  3.4 and GFR 27. Phosphorus 1.0. UA negative.   Head CT showed no acute intracranial abnormality with stable atrophy and chronic small vessel disease.   ED vitals 111/88, HR 85, SpO2 92% RA, RR 18, 98.3 F  TRH was consulted for admission and management of acute metabolic encephalopathy, hypercalcemia, hypernatremia and acute on chronic CKD stage IIIb.  Palliative care was consulted for assistance with goals of care conversations.   Clinical Assessment and Goals of Care: Extensive chart review completed prior to meeting patient including labs, vital signs, imaging, progress notes, orders, and available advanced directive documents from current and previous encounters. I then met with patient and Sonny (niece) at bedside and spoke to Shelby [name] (son) via phone to discuss diagnosis prognosis, GOC, EOL wishes, disposition and options.     Latest Ref Rng & Units 09/17/2024    5:41 AM 09/16/2024    5:00 AM 09/15/2024    2:40 PM  CBC  WBC 4.0 - 10.5 K/uL 5.6  6.7  8.0   Hemoglobin 12.0  - 15.0 g/dL 87.5  86.0  86.4   Hematocrit 36.0 - 46.0 % 38.4  44.7  44.5   Platelets 150 - 400 K/uL 144  156  199       Latest Ref Rng & Units 09/17/2024    7:46 AM 09/17/2024    5:41 AM 09/16/2024    9:00 PM  CMP  Glucose 70 - 99 mg/dL 897  897  849   BUN 8 - 23 mg/dL 38  39  40   Creatinine 0.44 - 1.00 mg/dL 8.38  8.47  8.43   Sodium 135 - 145 mmol/L 149  148  147   Potassium 3.5 - 5.1 mmol/L 3.5  3.2  3.4   Chloride 98 - 111 mmol/L 104  104  105   CO2 22 - 32 mmol/L 34  32  32   Calcium  8.9 - 10.3 mg/dL 87.5  87.6  87.3    Ill-appearing, female resting in bed. She awakens to verbal stimuli, answers some questions then immediately falls back to sleep. She is not able to participate in goals of care conversation. Sonny, niece, at bedside. Respirations are even and unlabored. She is in no distress.   Denies pain. Feels tired  and weak today. Did not eat breakfast.   I introduced Palliative Medicine as specialized medical care for people living with serious illness. It focuses on providing relief from the symptoms and stress of a serious illness. The goal is to improve quality of life for both the patient and the family.  We discussed a brief life review of the patient. [name] is widowed. She only had one child, Shelby [name]. She has grandchildren as well.   As far as functional and nutritional status, Shelby [name] shares that he and his mother reside together after moving here to care for her. Shelby [name] shares that his mother used a walker to ambulate and was able to independently go to bathroom alone until she suffered ankle fracture September 1st. Since her injury, she has been bedbound and now dependent for all ADLs.   Shelby [name] shares that she was in [employer] but had her removed after being unhappy with care. He took her home to care for her himself.   We discussed patient's current illness and what it means in the larger context of patient's on-going co-morbidities.  Natural  disease trajectory and expectations at EOL were discussed.  I attempted to elicit values and goals of care important to the patient.  Shelby [name] shares that the most important goals at this time is for his mother to return home. He inquires if there is some help available for home health to assist him with care. Advised that [facility] would be contacted for information.   The difference between aggressive medical intervention and comfort care was considered in light of the patient's goals of care.   Advance directives, concepts specific to code status, artificial feeding and hydration, and rehospitalization were considered and discussed. Shelby [name] is not sure about this topic at this time. He wants to discuss further. Will meet later at bedside for more discussion.   Education offered regarding concept specific to human mortality and the limitations of medical interventions to prolong life when the body begins to fail to thrive.  Family is facing treatment option decisions, advanced directive, and anticipatory care needs.    Discussed with patient/family the importance of continued conversation with family and the medical providers regarding overall plan of care and treatment options, ensuring decisions are within the context of the patient's values and GOCs.    Questions and concerns were addressed. The family was encouraged to call with questions or concerns.   Primary Decision Maker NEXT OF KIN- Shelby [name] Acie, son  Physical Exam Vitals reviewed.  Constitutional:      Comments: Lethargic  HENT:     Head: Normocephalic and atraumatic.     Mouth/Throat:     Mouth: Mucous membranes are dry.  Pulmonary:     Effort: Pulmonary effort is normal. No respiratory distress.  Abdominal:     General: There is no distension.     Tenderness: There is no abdominal tenderness. There is no guarding.  Musculoskeletal:     Right lower leg: No edema.     Left lower leg: No edema.     Comments: Unna boots in  place bilaterally  Neurological:     Mental Status: She is disoriented.    Recommendations/Plan: FULL CODE status as previously documented    Continue current supportive interventions Plan to d/c home when medically stable PMT will continue to follow for GOC  Palliative Assessment/Data: 50%   Attempted to contact Shelby [name] via phone unsuccessfully for further GOC discussion. Left voicemail but have not received return call. Will  call again tomorrow for GOC.   Thank you for this consult. Palliative medicine will continue to follow and assist holistically.   Time Total: 90 minutes  Time spent includes: Detailed review of medical records (labs, imaging, vital signs), medically appropriate exam (mental status, respiratory, cardiac, skin), discussed with treatment team, counseling and educating patient, family and staff, documenting clinical information, medication management and coordination of care.     Devere Sacks, AMANDA [facility] Palliative Medicine Team  09/17/2024 8:34 AM  Office [phone]  Pager [phone]     Please contact Palliative Medicine Team providers via AMION for questions and concerns.

## 2024-09-18 DIAGNOSIS — Z7189 Other specified counseling: Secondary | ICD-10-CM | POA: Diagnosis not present

## 2024-09-18 DIAGNOSIS — Z515 Encounter for palliative care: Secondary | ICD-10-CM | POA: Diagnosis not present

## 2024-09-18 DIAGNOSIS — Z66 Do not resuscitate: Secondary | ICD-10-CM | POA: Diagnosis not present

## 2024-09-18 LAB — URINALYSIS, COMPLETE (UACMP) WITH MICROSCOPIC
Bilirubin Urine: NEGATIVE
Glucose, UA: >=500 mg/dL — AB
Ketones, ur: NEGATIVE mg/dL
Nitrite: NEGATIVE
Protein, ur: NEGATIVE mg/dL
Specific Gravity, Urine: 1.018 (ref 1.005–1.030)
pH: 8 (ref 5.0–8.0)

## 2024-09-18 LAB — BASIC METABOLIC PANEL WITH GFR
Anion gap: 8 (ref 5–15)
Anion gap: 12 (ref 5–15)
BUN: 31 mg/dL — ABNORMAL HIGH (ref 8–23)
BUN: 30 mg/dL — ABNORMAL HIGH (ref 8–23)
CO2: 34 mmol/L — ABNORMAL HIGH (ref 22–32)
CO2: 29 mmol/L (ref 22–32)
Calcium: 11 mg/dL — ABNORMAL HIGH (ref 8.9–10.3)
Calcium: 11 mg/dL — ABNORMAL HIGH (ref 8.9–10.3)
Chloride: 106 mmol/L (ref 98–111)
Chloride: 106 mmol/L (ref 98–111)
Creatinine, Ser: 1.36 mg/dL — ABNORMAL HIGH (ref 0.44–1.00)
Creatinine, Ser: 1.43 mg/dL — ABNORMAL HIGH (ref 0.44–1.00)
GFR, Estimated: 39 mL/min — ABNORMAL LOW (ref >60)
GFR, Estimated: 37 mL/min — ABNORMAL LOW (ref >60)
Glucose, Bld: 122 mg/dL — ABNORMAL HIGH (ref 70–99)
Glucose, Bld: 147 mg/dL — ABNORMAL HIGH (ref 70–99)
Potassium: 2.8 mmol/L — ABNORMAL LOW (ref 3.5–5.1)
Potassium: 3.9 mmol/L (ref 3.5–5.1)
Sodium: 148 mmol/L — ABNORMAL HIGH (ref 135–145)
Sodium: 147 mmol/L — ABNORMAL HIGH (ref 135–145)

## 2024-09-18 LAB — CBC
HCT: 35.9 % — ABNORMAL LOW (ref 36.0–46.0)
Hemoglobin: 11.3 g/dL — ABNORMAL LOW (ref 12.0–15.0)
MCH: 30.4 pg (ref 26.0–34.0)
MCHC: 31.5 g/dL (ref 30.0–36.0)
MCV: 96.5 fL (ref 80.0–100.0)
Platelets: 141 K/uL — ABNORMAL LOW (ref 150–400)
RBC: 3.72 MIL/uL — ABNORMAL LOW (ref 3.87–5.11)
RDW: 15.9 % — ABNORMAL HIGH (ref 11.5–15.5)
WBC: 5.5 K/uL (ref 4.0–10.5)
nRBC: 0 % (ref 0.0–0.2)

## 2024-09-18 LAB — GLUCOSE, CAPILLARY
Glucose-Capillary: 116 mg/dL — ABNORMAL HIGH (ref 70–99)
Glucose-Capillary: 117 mg/dL — ABNORMAL HIGH (ref 70–99)
Glucose-Capillary: 102 mg/dL — ABNORMAL HIGH (ref 70–99)
Glucose-Capillary: 135 mg/dL — ABNORMAL HIGH (ref 70–99)
Glucose-Capillary: 123 mg/dL — ABNORMAL HIGH (ref 70–99)
Glucose-Capillary: 128 mg/dL — ABNORMAL HIGH (ref 70–99)

## 2024-09-18 LAB — MAGNESIUM: Magnesium: 1.8 mg/dL (ref 1.7–2.4)

## 2024-09-18 LAB — PHOSPHORUS
Phosphorus: 2.1 mg/dL — ABNORMAL LOW (ref 2.5–4.6)
Phosphorus: 1.9 mg/dL — ABNORMAL LOW (ref 2.5–4.6)

## 2024-09-18 MED ORDER — POTASSIUM CHLORIDE CRYS ER 20 MEQ PO TBCR
40.0000 meq | EXTENDED_RELEASE_TABLET | Freq: Two times a day (BID) | ORAL | Status: DC
  Filled 2024-09-18: qty 2

## 2024-09-18 MED ORDER — POTASSIUM PHOSPHATES 15 MMOLE/5ML IV SOLN
30.0000 mmol | Freq: Once | INTRAVENOUS | Status: AC
  Administered 2024-09-18: 30 mmol via INTRAVENOUS
  Filled 2024-09-18: qty 10

## 2024-09-18 MED ORDER — ENSURE PLUS HIGH PROTEIN PO LIQD
237.0000 mL | Freq: Two times a day (BID) | ORAL | Status: DC
  Administered 2024-09-18 – 2024-09-20 (×4): 237 mL via ORAL

## 2024-09-18 MED ORDER — DEXTROSE-SODIUM CHLORIDE 5-0.45 % IV SOLN: INTRAVENOUS | Status: DC

## 2024-09-18 MED ORDER — DEXTROSE 5 % IV SOLN: INTRAVENOUS | Status: AC

## 2024-09-18 MED ORDER — POTASSIUM CHLORIDE CRYS ER 20 MEQ PO TBCR
40.0000 meq | EXTENDED_RELEASE_TABLET | Freq: Two times a day (BID) | ORAL | Status: AC
  Administered 2024-09-18 (×2): 40 meq via ORAL
  Filled 2024-09-18: qty 2

## 2024-09-18 NOTE — Progress Notes (Signed)
 [facility] Progress Note  Patient: Shelby [name]    [name]  DOA: 09/15/2024     Date of Service: the patient was seen and examined on 09/18/2024  Chief Complaint  Patient presents with   Altered Mental Status   Weakness   Brief hospital course: [name] is a 83 y.o. female with medical history significant of Hyperparathyroidism, dementia, heart failure with reduced ejection fraction, anemia of chronic disease, dilated cardiomyopathy, type 2 diabetes, essential hypertension, hyperlipidemia, obstructive sleep apnea, peripheral neuropathy, chronic kidney disease stage III, history of seizure-like activity and urinary incontinence who was brought in from [facility] by family wanting her to be checked.  Patient was recently diagnosed with UTI pneumonia and CHF exacerbation.  She has been taking medications at the facility.  Family visited and noticed she is more confused.  Patient is currently unable to give history.  She also altered.  She responds to voice by opening her eyes.  Workup performed showed significant change from last month.  Her sodium is 150, calcium  14.2 and phosphorus only 1.  Patient also has known history of chronic kidney disease creatinine is 1.87.  Head CT without contrast showed no acute findings.  Nephrology consulted and recommends admission for treatment of hypercalcemia and hyponatremia.    Assessment and Plan:  # Acute metabolic encephalopathy: Most likely multifactorial.  Hypercalcemia and hypernatremia. UA negative  Continue supportive care, delirium precautions Fall precautions   # Hypercalcemia: Due to known history of hyperparathyroidism Continue to monitor on telemetry Continue Sensipar, patient was on it due to known history of hyperparathyroidism Continue IVF D5 0.45 saline for hydration Nephrology consulted, pamidronate order placed Monitor BMP daily Ca 12.4 >11.0   # Hypernatremia: Most likely due to  dehydration.  Continue D5 half NS IV hydration  # Hypokalemia, potassium repleted.   # Hypophosphatemia secondary to nutritional deficiency. Phos repleted. Check electrolytes daily  # type 2 diabetes: Initiate sliding scale insulin .   # Essential hypertension: Off medications at this time Continue to monitor BP and titrate medications accordingly    # Acute on chronic kidney disease stage IIIb: Renal function appears to be at baseline.  Continue to monitor Bladder scan negative for urinary retention Ultrasound renal pending   # hyperlipidemia: Will resume statin when stable.    # dilated cardiomyopathy: Appears compensated.  Hold off medications for now, resume when patient is stable.   # dementia: Currently obtunded. # obstructive sleep apnea: Consider CPAP at night.  # Obesity class II Body mass index is 36.25 kg/m.  Interventions: Calorie restricted diet and daily exercise will be advised to lose body weight.  Lifestyle modification needs to be discussed with patient has severe dementia    Wound 08/18/24 1821 Pressure Injury Buttocks Mid Stage 2 -  Partial thickness loss of dermis presenting as a shallow open injury with a red, pink wound bed without slough. (Active)     Diet: Dysphagia 3 diet DVT Prophylaxis: Subcutaneous Heparin     Advance goals of care discussion: Full code  Family Communication: family was present at bedside, at the time of interview.  The pt provided permission to discuss medical plan with the family. Opportunity was given to ask question and all questions were answered satisfactorily.   Disposition:  Pt is from SNF, admitted with AME due to hypercalcemia, still has elevated calcium  level, which precludes a safe discharge. Discharge to SNF, when stable, may need few days to improve.  Subjective: No significant event overnight, patient  was more awake and alert today as compared to yesterday, she is AO x 2, does not know her date of birth and not  aware of month and year. She was resting comfortably, did not offer any complaints, stated that she is feeling fair.    Physical Exam: General: NAD, lying comfortably Appear in no distress, affect appropriate Eyes: PERRLA ENT: Oral Mucosa Clear, moist  Neck: no JVD,  Cardiovascular: S1 and S2 Present, no Murmur,  Respiratory: good respiratory effort, Bilateral Air entry equal and Decreased, no Crackles, no wheezes Abdomen: Bowel Sound present, Soft and no tenderness,  Skin: no rashes Extremities: no Pedal edema, no calf tenderness Neurologic: without any new focal findings Gait not checked due to patient safety concerns  Vitals:   09/17/24 1554 09/17/24 2017 09/18/24 0447 09/18/24 0728  BP: (!) 106/56 (!) 102/51 132/68 122/65  Pulse: 84 81 73 77  Resp:  16 16 16   Temp:  98.3 F (36.8 C) 98.1 F (36.7 C) 98.3 F (36.8 C)  TempSrc:   Oral   SpO2: 97% 100% 99% 98%  Weight:        Intake/Output Summary (Last 24 hours) at 09/18/2024 1317 Last data filed at 09/18/2024 0600 Gross per 24 hour  Intake 1587.97 ml  Output 300 ml  Net 1287.97 ml   Filed Weights   09/16/24 0800  Weight: 84.2 kg    Data Reviewed: I have personally reviewed and interpreted daily labs, tele strips, imagings as discussed above. I reviewed all nursing notes, pharmacy notes, vitals, pertinent old records I have discussed plan of care as described above with RN and patient/family.  CBC: Recent Labs  Lab 09/15/24 1440 09/16/24 0500 09/17/24 0541 09/18/24 0540  WBC 8.0 6.7 5.6 5.5  HGB 13.5 13.9 12.4 11.3*  HCT 44.5 44.7 38.4 35.9*  MCV 98.7 97.4 96.0 96.5  PLT 199 156 144* 141*   Basic Metabolic Panel: Recent Labs  Lab 09/15/24 1440 09/16/24 0500 09/16/24 1612 09/16/24 2100 09/17/24 0541 09/17/24 0746 09/17/24 2042 09/18/24 0540  NA 150* 150*   < > 147* 148* 149* 146* 148*  K 4.1 3.7   < > 3.4* 3.2* 3.5 3.1* 2.8*  CL 106 104   < > 105 104 104 106 106  CO2 36* 32   < > 32 32 34*  32 34*  GLUCOSE 121* 157*   < > 150* 102* 102* 118* 122*  BUN 44* 46*   < > 40* 39* 38* 39* 31*  CREATININE 1.87* 1.65*   < > 1.56* 1.52* 1.61* 1.39* 1.36*  CALCIUM  14.2* 13.5*   < > 12.6* 12.3* 12.4* 11.3* 11.0*  MG  --  2.3  --   --  2.2  --   --  1.8  PHOS 1.0* 1.5*  --   --  2.4*  --   --  2.1*   < > = values in this interval not displayed.    Studies: US  RENAL Result Date: 09/17/2024 CLINICAL DATA:  [id number] AKI (acute kidney injury) [id number] EXAM: RENAL / URINARY TRACT ULTRASOUND COMPLETE COMPARISON:  None Available. FINDINGS: Right Kidney: Renal measurements: 3.4 x 5.5 x 9.0 cm = volume: 88.4 mL. Echogenicity within normal limits. No hydronephrosis. There are multiple anechoic structures in the right kidney with largest measuring up to 1.8 x 1.9 x 2.6 cm. Majority of these are favored to represent simple cysts. However, not all the renal lesions are seen on this exam. Please refer to MRI abdomen report from  the same day for details. Left Kidney: Renal measurements: 4.6 x 4.7 x 8.5 cm = volume: 95.7 mL. Echogenicity within normal limits. No hydronephrosis. There are multiple anechoic structures in the left kidney with largest measuring up to 3.4 x 3.5 x 3.5 cm. Majority of these are favored to represent simple cysts. However, not all the renal lesions are seen on this exam. Please refer to MRI abdomen report from the same day for details. Bladder: Appears normal for degree of bladder distention. Other: None. IMPRESSION: *Multiple bilateral renal lesions, as described above. No hydronephrosis. Electronically Signed   By: Ree Molt M.D.   On: 09/17/2024 17:38    Scheduled Meds:  feeding supplement  237 mL Oral BID BM   heparin   5,000 Units Subcutaneous Q8H   insulin  aspart  0-15 Units Subcutaneous TID WC   insulin  aspart  0-5 Units Subcutaneous QHS   potassium chloride   40 mEq Oral BID   Continuous Infusions:  dextrose 50 mL/hr at 09/18/24 0949   potassium PHOSPHATE IVPB (in mmol)      PRN Meds: acetaminophen  **OR** acetaminophen , HYDROcodone-acetaminophen , ondansetron  **OR** ondansetron  (ZOFRAN ) IV  Time spent: 55 minutes  Author: ELVAN SOR. MD [facility] 09/18/2024 1:17 PM  To reach On-call, see care teams to locate the attending and reach out to them via [email or web address]. If 7PM-7AM, please contact night-coverage If you still have difficulty reaching the attending provider, please page the [facility] (Director on Call) for [facility] on amion for assistance.

## 2024-09-18 NOTE — Progress Notes (Signed)
 Mobility Specialist - Progress Note   09/18/24 1538  Mobility  Activity Dangled on edge of bed;Stood at bedside;Turned to right side;Turned to left side;Turned to back - supine  Level of Assistance +2 (takes two people)  Musician  Range of Motion/Exercises All extremities;Active Assistive  Activity Response Tolerated fair  Mobility visit 1 Mobility  Mobility Specialist Start Time (ACUTE ONLY) 1432  Mobility Specialist Stop Time (ACUTE ONLY) 1526  Mobility Specialist Time Calculation (min) (ACUTE ONLY) 54 min     Pt lying in bed upon arrival, utilizing RA. Pt agreeable to activity with min encouragement. Completed bed mobility with maxA +2. Increased time needed to complete all tasks. Voiced pain in rectum upon sitting d/t skin breakdown. Pt attempted 1 STS with maxA +2. Foot blocking. Partial standing with buttocks just barely clearing EOB. Pt fatigues quickly with minimal activity. Noted incontinent BM upon standing attempt. Pt returned to bed for peri-care. Rolled L/R. Pillow wedge support provided to relieve pressure on pt's bottom. Alarm set, needs in reach. Family at bedside.    Lennette Seip Mobility Specialist 09/18/24, 3:49 PM

## 2024-09-18 NOTE — Progress Notes (Signed)
 Palliative Care Progress Note, Assessment & Plan   Patient Name: Veretta Sabourin       Date: 09/18/2024 DOB: [birth year]  Age: 83 y.o. MRN#: [id number] Attending Physician: Von Bellis, MD Primary Care Physician: Steva Clotilda DEL, NP Admit Date: 09/15/2024  Subjective: Reports feeling about the same as yesterday, weak and tired. Denies pain. Does not want to eat breakfast.   HPI: 83 y.o. female  with past medical history significant for dementia, HFrEF, anemia of chronic disease, dilated cardiomyopathy, hyperparathyroidism, DM II, essential hypertension, HLD, OSA, peripheral neuropathy, CKD stage III, history of seizure-like activity and urinary incontinence. Patient presented to ED 09/15/2024 c/o AMS and weakness. Patient family reported in ED that patient was recently treated for UTI, PNA and CHF exacerbation. They requested evaluation due to being more confused than normal.    ED labs significant for sodium 150, CO2 36, glucose 121, BUN 44, creatinine 1.87, calcium  14.2, albumin  3.4 and GFR 27. Phosphorus 1.0. UA negative.    Head CT showed no acute intracranial abnormality with stable atrophy and chronic small vessel disease.    ED vitals 111/88, HR 85, SpO2 92% RA, RR 18, 98.3 F   TRH was consulted for admission and management of acute metabolic encephalopathy, hypercalcemia, hypernatremia and acute on chronic CKD stage IIIb.   Palliative care was consulted for assistance with goals of care conversations.   Summary of counseling/coordination of care: Extensive chart review completed prior to meeting patient including labs, vital signs, imaging, progress notes, orders, and available advanced directive documents from current and previous encounters.      Latest Ref Rng & Units 09/18/2024     5:40 AM 09/17/2024    5:41 AM 09/16/2024    5:00 AM  CBC  WBC 4.0 - 10.5 K/uL 5.5  5.6  6.7   Hemoglobin 12.0 - 15.0 g/dL 88.6  87.5  86.0   Hematocrit 36.0 - 46.0 % 35.9  38.4  44.7   Platelets 150 - 400 K/uL 141  144  156       Latest Ref Rng & Units 09/18/2024    5:40 AM 09/17/2024    8:42 PM 09/17/2024    7:46 AM  CMP  Glucose 70 - 99 mg/dL 877  881  897   BUN 8 - 23 mg/dL 31  39  38   Creatinine 0.44 - 1.00 mg/dL 8.63  8.60  8.38   Sodium 135 - 145 mmol/L 148  146  149   Potassium 3.5 - 5.1 mmol/L 2.8  3.1  3.5   Chloride 98 - 111 mmol/L 106  106  104   CO2 22 - 32 mmol/L 34  32  34   Calcium  8.9 - 10.3 mg/dL 88.9  88.6  87.5    After reviewing the patient's chart and assessing the patient at bedside, I spoke with patient and Vinie, son, in regards to symptom management and goals of care.   Ill-appearing, elderly female resting in bed. She is alert and oriented to self, location and DOB. She is unable to correctly state year. Patient is more alert today than yesterday and able to participate in conversation. Respirations are even and unlabored. She is in  no distress. Vinie, son, and friend at bedside.   Discussed with Vinie goals of care for his mother. He shares that the goal is for her to return home, but he does want her to be able to go to rehab first. He shares that he has spoken with [facility] regarding rehab placement. He also expresses interest in OP palliative care following his mother to facility and then at home.   After a long conversation regarding code status, Vinie shares that he would not want his mother to have CPR for fear of injury. However, he is in agreement to ventilator short term if his mother were to need respiratory support.  DNR signed and placed in chart.   Due to his mother not eating much, Vinie brings her an Ensure drink daily. He was advised that these could be ordered for her to have while she is here. He is appreciative of this offer.    Therapeutic silence and active listening provided for Vinie to share his thoughts and emotions regarding current medical situation.  Emotional support provided.  Physical Exam Vitals and nursing note reviewed.  Constitutional:      General: She is not in acute distress.    Appearance: She is ill-appearing.  HENT:     Head: Normocephalic and atraumatic.     Mouth/Throat:     Mouth: Mucous membranes are moist.  Pulmonary:     Effort: Pulmonary effort is normal. No respiratory distress.  Abdominal:     General: There is no distension.     Tenderness: There is no abdominal tenderness. There is no guarding.  Musculoskeletal:     Right lower leg: No edema.     Left lower leg: No edema.     Comments: Unna boots in place  Skin:    General: Skin is warm and dry.  Neurological:     Mental Status: She is alert. She is disoriented.     Motor: Weakness present.     Recommendations/Plan: Code status updated to DNR with interventions    Continue current supportive interventions Plan to discharge to rehab  PMT to follow for needs   Total Time 65 minutes   Time spent includes: Detailed review of medical records (labs, imaging, vital signs), medically appropriate exam (mental status, respiratory, cardiac, skin), discussed with treatment team, counseling and educating patient, family and staff, documenting clinical information, medication management and coordination of care.     Devere Sacks, AMANDA [facility] Palliative Medicine Team  09/18/2024 8:53 AM  Office [phone]  Pager [phone]

## 2024-09-18 NOTE — Progress Notes (Signed)
 Mobility Specialist - Progress Note   09/18/24 1300  Mobility  Activity Refused and notified nurse if applicable     Pt sleeping on arrival, awakened to voice and politely declined mobility. Pt requested to return at a later time. Will attempt another date/time as able.    Lennette Seip Mobility Specialist 09/18/24, 1:05 PM

## 2024-09-18 NOTE — Progress Notes (Signed)
 [facility]  ROUNDING NOTE   Subjective:   Patient seen and evaluated at bedside, resting comfortably.. No acute events overnight. Patient has no complaints to offer  Objective:  Vital signs in last 24 hours:  Temp:  [98.1 F (36.7 C)-98.3 F (36.8 C)] 98.3 F (36.8 C) (10/12 0728) Pulse Rate:  [73-84] 77 (10/12 0728) Resp:  [16] 16 (10/12 0728) BP: (102-132)/(51-68) 122/65 (10/12 0728) SpO2:  [97 %-100 %] 98 % (10/12 0728)  Weight change:  Filed Weights   09/16/24 0800  Weight: 84.2 kg    Intake/Output: I/O last 3 completed shifts: In: 1588 [I.V.:1588] Out: 300 [Urine:300]   Intake/Output this shift:  No intake/output data recorded.  Physical Exam: General: NAD,   Head: Normocephalic  Eyes: Anicteric,  Lungs:  Clear to auscultation  Heart: Regular rate  Abdomen:  Soft  Extremities:  no peripheral edema.  Neurologic: alert  Skin: warm    Basic Metabolic Panel: Recent Labs  Lab 09/15/24 1440 09/16/24 0500 09/16/24 1612 09/16/24 2100 09/17/24 0541 09/17/24 0746 09/17/24 2042 09/18/24 0540  NA 150* 150*   < > 147* 148* 149* 146* 148*  K 4.1 3.7   < > 3.4* 3.2* 3.5 3.1* 2.8*  CL 106 104   < > 105 104 104 106 106  CO2 36* 32   < > 32 32 34* 32 34*  GLUCOSE 121* 157*   < > 150* 102* 102* 118* 122*  BUN 44* 46*   < > 40* 39* 38* 39* 31*  CREATININE 1.87* 1.65*   < > 1.56* 1.52* 1.61* 1.39* 1.36*  CALCIUM  14.2* 13.5*   < > 12.6* 12.3* 12.4* 11.3* 11.0*  MG  --  2.3  --   --  2.2  --   --  1.8  PHOS 1.0* 1.5*  --   --  2.4*  --   --  2.1*   < > = values in this interval not displayed.    Liver Function Tests: Recent Labs  Lab 09/15/24 1440 09/16/24 0500  AST 37 37  ALT 26 23  ALKPHOS 59 61  BILITOT 0.7 0.6  PROT 7.3 6.8  ALBUMIN  3.4* 3.2*   No results for input(s): LIPASE, AMYLASE in the last 168 hours. No results for input(s): AMMONIA in the last 168 hours.  CBC: Recent Labs  Lab 09/15/24 1440 09/16/24 0500  09/17/24 0541 09/18/24 0540  WBC 8.0 6.7 5.6 5.5  HGB 13.5 13.9 12.4 11.3*  HCT 44.5 44.7 38.4 35.9*  MCV 98.7 97.4 96.0 96.5  PLT 199 156 144* 141*    Cardiac Enzymes: No results for input(s): CKTOTAL, CKMB, CKMBINDEX, TROPONINI in the last 168 hours.  BNP: Invalid input(s): POCBNP  CBG: Recent Labs  Lab 09/17/24 1257 09/17/24 1747 09/17/24 2116 09/18/24 0730 09/18/24 0815  GLUCAP 70 162* 121* 116* 117*    Microbiology: Results for orders placed or performed during the hospital encounter of 08/18/24  Resp panel by RT-PCR (RSV, Flu A&B, Covid) Anterior Nasal Swab     Status: None   Collection Time: 08/18/24  1:12 PM   Specimen: Anterior Nasal Swab  Result Value Ref Range Status   SARS Coronavirus 2 by RT PCR NEGATIVE NEGATIVE Final    Comment: (NOTE) SARS-CoV-2 target nucleic acids are NOT DETECTED.  The SARS-CoV-2 RNA is generally detectable in upper respiratory specimens during the acute phase of infection. The lowest concentration of SARS-CoV-2 viral copies this assay can detect is 138 copies/mL. A negative result  does not preclude SARS-Cov-2 infection and should not be used as the sole basis for treatment or other patient management decisions. A negative result may occur with  improper specimen collection/handling, submission of specimen other than nasopharyngeal swab, presence of viral mutation(s) within the areas targeted by this assay, and inadequate number of viral copies(<138 copies/mL). A negative result must be combined with clinical observations, patient history, and epidemiological information. The expected result is Negative.  Fact Sheet for Patients:  BloggerCourse.com  Fact Sheet for Healthcare Providers:  SeriousBroker.it  This test is no t yet approved or cleared by the [place]  FDA and  has been authorized for detection and/or diagnosis of SARS-CoV-2 by FDA under an Emergency  Use Authorization (EUA). This EUA will remain  in effect (meaning this test can be used) for the duration of the COVID-19 declaration under Section 564(b)(1) of the Act, 21 U.S.C.section 360bbb-3(b)(1), unless the authorization is terminated  or revoked sooner.       Influenza A by PCR NEGATIVE NEGATIVE Final   Influenza B by PCR NEGATIVE NEGATIVE Final    Comment: (NOTE) The Xpert Xpress SARS-CoV-2/FLU/RSV plus assay is intended as an aid in the diagnosis of influenza from Nasopharyngeal swab specimens and should not be used as a sole basis for treatment. Nasal washings and aspirates are unacceptable for Xpert Xpress SARS-CoV-2/FLU/RSV testing.  Fact Sheet for Patients: BloggerCourse.com  Fact Sheet for Healthcare Providers: SeriousBroker.it  This test is not yet approved or cleared by the [place]  FDA and has been authorized for detection and/or diagnosis of SARS-CoV-2 by FDA under an Emergency Use Authorization (EUA). This EUA will remain in effect (meaning this test can be used) for the duration of the COVID-19 declaration under Section 564(b)(1) of the Act, 21 U.S.C. section 360bbb-3(b)(1), unless the authorization is terminated or revoked.     Resp Syncytial Virus by PCR NEGATIVE NEGATIVE Final    Comment: (NOTE) Fact Sheet for Patients: BloggerCourse.com  Fact Sheet for Healthcare Providers: SeriousBroker.it  This test is not yet approved or cleared by the [place]  FDA and has been authorized for detection and/or diagnosis of SARS-CoV-2 by FDA under an Emergency Use Authorization (EUA). This EUA will remain in effect (meaning this test can be used) for the duration of the COVID-19 declaration under Section 564(b)(1) of the Act, 21 U.S.C. section 360bbb-3(b)(1), unless the authorization is terminated or revoked.  Performed at [facility], [address] [place], [place] [address]   Urine Culture     Status: Abnormal   Collection Time: 08/18/24  1:12 PM   Specimen: Urine, Catheterized  Result Value Ref Range Status   Specimen Description   Final    URINE, CATHETERIZED Performed at [facility], [address], [place], [place] [address]    Special Requests   Final    NONE Performed at [facility], [address] [place], [place] [address]    Culture (A)  Final    >=100,000 COLONIES/mL KLEBSIELLA PNEUMONIAE >=100,000 COLONIES/mL ESCHERICHIA COLI    Report Status 08/21/2024 FINAL  Final   Organism ID, Bacteria KLEBSIELLA PNEUMONIAE (A)  Final   Organism ID, Bacteria ESCHERICHIA COLI (A)  Final      Susceptibility   Escherichia coli - MIC*    AMPICILLIN <=2 SENSITIVE Sensitive     CEFAZOLIN (URINE) Value in next row Sensitive      <=1 SENSITIVEThis is a modified FDA-approved test that has been validated and its performance characteristics determined by  the reporting laboratory.  This laboratory is certified under the Clinical Laboratory Improvement Amendments CLIA as qualified to perform high complexity clinical laboratory testing.    CEFEPIME Value in next row Sensitive      <=1 SENSITIVEThis is a modified FDA-approved test that has been validated and its performance characteristics determined by the reporting laboratory.  This laboratory is certified under the Clinical Laboratory Improvement Amendments CLIA as qualified to perform high complexity clinical laboratory testing.    ERTAPENEM Value in next row Sensitive      <=1 SENSITIVEThis is a modified FDA-approved test that has been validated and its performance characteristics determined by the reporting laboratory.  This laboratory is certified under the Clinical Laboratory Improvement Amendments CLIA as qualified to perform high complexity clinical laboratory testing.    CEFTRIAXONE  Value in next row Sensitive      <=1 SENSITIVEThis is a modified  FDA-approved test that has been validated and its performance characteristics determined by the reporting laboratory.  This laboratory is certified under the Clinical Laboratory Improvement Amendments CLIA as qualified to perform high complexity clinical laboratory testing.    CIPROFLOXACIN Value in next row Sensitive      <=1 SENSITIVEThis is a modified FDA-approved test that has been validated and its performance characteristics determined by the reporting laboratory.  This laboratory is certified under the Clinical Laboratory Improvement Amendments CLIA as qualified to perform high complexity clinical laboratory testing.    GENTAMICIN Value in next row Sensitive      <=1 SENSITIVEThis is a modified FDA-approved test that has been validated and its performance characteristics determined by the reporting laboratory.  This laboratory is certified under the Clinical Laboratory Improvement Amendments CLIA as qualified to perform high complexity clinical laboratory testing.    NITROFURANTOIN Value in next row Sensitive      <=1 SENSITIVEThis is a modified FDA-approved test that has been validated and its performance characteristics determined by the reporting laboratory.  This laboratory is certified under the Clinical Laboratory Improvement Amendments CLIA as qualified to perform high complexity clinical laboratory testing.    TRIMETH/SULFA Value in next row Sensitive      <=1 SENSITIVEThis is a modified FDA-approved test that has been validated and its performance characteristics determined by the reporting laboratory.  This laboratory is certified under the Clinical Laboratory Improvement Amendments CLIA as qualified to perform high complexity clinical laboratory testing.    AMPICILLIN/SULBACTAM Value in next row Sensitive      <=1 SENSITIVEThis is a modified FDA-approved test that has been validated and its performance characteristics determined by the reporting laboratory.  This laboratory is certified  under the Clinical Laboratory Improvement Amendments CLIA as qualified to perform high complexity clinical laboratory testing.    PIP/TAZO Value in next row Sensitive ug/mL     <=4 SENSITIVEThis is a modified FDA-approved test that has been validated and its performance characteristics determined by the reporting laboratory.  This laboratory is certified under the Clinical Laboratory Improvement Amendments CLIA as qualified to perform high complexity clinical laboratory testing.    MEROPENEM Value in next row Sensitive      <=4 SENSITIVEThis is a modified FDA-approved test that has been validated and its performance characteristics determined by the reporting laboratory.  This laboratory is certified under the Clinical Laboratory Improvement Amendments CLIA as qualified to perform high complexity clinical laboratory testing.    * >=100,000 COLONIES/mL ESCHERICHIA COLI   Klebsiella pneumoniae - MIC*    AMPICILLIN Value in next row Resistant      <=  4 SENSITIVEThis is a modified FDA-approved test that has been validated and its performance characteristics determined by the reporting laboratory.  This laboratory is certified under the Clinical Laboratory Improvement Amendments CLIA as qualified to perform high complexity clinical laboratory testing.    CEFAZOLIN (URINE) Value in next row Sensitive      2 SENSITIVEThis is a modified FDA-approved test that has been validated and its performance characteristics determined by the reporting laboratory.  This laboratory is certified under the Clinical Laboratory Improvement Amendments CLIA as qualified to perform high complexity clinical laboratory testing.    CEFEPIME Value in next row Sensitive      2 SENSITIVEThis is a modified FDA-approved test that has been validated and its performance characteristics determined by the reporting laboratory.  This laboratory is certified under the Clinical Laboratory Improvement Amendments CLIA as qualified to perform high  complexity clinical laboratory testing.    ERTAPENEM Value in next row Sensitive      2 SENSITIVEThis is a modified FDA-approved test that has been validated and its performance characteristics determined by the reporting laboratory.  This laboratory is certified under the Clinical Laboratory Improvement Amendments CLIA as qualified to perform high complexity clinical laboratory testing.    CEFTRIAXONE  Value in next row Sensitive      2 SENSITIVEThis is a modified FDA-approved test that has been validated and its performance characteristics determined by the reporting laboratory.  This laboratory is certified under the Clinical Laboratory Improvement Amendments CLIA as qualified to perform high complexity clinical laboratory testing.    CIPROFLOXACIN Value in next row Sensitive      2 SENSITIVEThis is a modified FDA-approved test that has been validated and its performance characteristics determined by the reporting laboratory.  This laboratory is certified under the Clinical Laboratory Improvement Amendments CLIA as qualified to perform high complexity clinical laboratory testing.    GENTAMICIN Value in next row Sensitive      2 SENSITIVEThis is a modified FDA-approved test that has been validated and its performance characteristics determined by the reporting laboratory.  This laboratory is certified under the Clinical Laboratory Improvement Amendments CLIA as qualified to perform high complexity clinical laboratory testing.    NITROFURANTOIN Value in next row Intermediate      2 SENSITIVEThis is a modified FDA-approved test that has been validated and its performance characteristics determined by the reporting laboratory.  This laboratory is certified under the Clinical Laboratory Improvement Amendments CLIA as qualified to perform high complexity clinical laboratory testing.    TRIMETH/SULFA Value in next row Sensitive      2 SENSITIVEThis is a modified FDA-approved test that has been validated and  its performance characteristics determined by the reporting laboratory.  This laboratory is certified under the Clinical Laboratory Improvement Amendments CLIA as qualified to perform high complexity clinical laboratory testing.    AMPICILLIN/SULBACTAM Value in next row Sensitive      2 SENSITIVEThis is a modified FDA-approved test that has been validated and its performance characteristics determined by the reporting laboratory.  This laboratory is certified under the Clinical Laboratory Improvement Amendments CLIA as qualified to perform high complexity clinical laboratory testing.    PIP/TAZO Value in next row Sensitive ug/mL     <=4 SENSITIVEThis is a modified FDA-approved test that has been validated and its performance characteristics determined by the reporting laboratory.  This laboratory is certified under the Clinical Laboratory Improvement Amendments CLIA as qualified to perform high complexity clinical laboratory testing.    MEROPENEM Value in  next row Sensitive      <=4 SENSITIVEThis is a modified FDA-approved test that has been validated and its performance characteristics determined by the reporting laboratory.  This laboratory is certified under the Clinical Laboratory Improvement Amendments CLIA as qualified to perform high complexity clinical laboratory testing.    * >=100,000 COLONIES/mL KLEBSIELLA PNEUMONIAE  Culture, blood (Routine X 2) w Reflex to ID Panel     Status: None   Collection Time: 08/18/24  3:45 PM   Specimen: BLOOD  Result Value Ref Range Status   Specimen Description BLOOD BLOOD RIGHT HAND  Final   Special Requests   Final    BOTTLES DRAWN AEROBIC AND ANAEROBIC Blood Culture results may not be optimal due to an inadequate volume of blood received in culture bottles   Culture   Final    NO GROWTH 5 DAYS Performed at [facility], [address] [place], [place] [address]    Report Status 08/23/2024 FINAL  Final  Culture, blood (Routine X 2) w Reflex to  ID Panel     Status: None   Collection Time: 08/18/24  3:45 PM   Specimen: BLOOD  Result Value Ref Range Status   Specimen Description BLOOD BLOOD LEFT HAND  Final   Special Requests   Final    AEROBIC BOTTLE ONLY Blood Culture results may not be optimal due to an inadequate volume of blood received in culture bottles   Culture   Final    NO GROWTH 5 DAYS Performed at [facility], [address] [place], [place] [address]    Report Status 08/23/2024 FINAL  Final    Coagulation Studies: No results for input(s): LABPROT, INR in the last 72 hours.  Urinalysis: Recent Labs    09/16/24 1205  COLORURINE YELLOW*  LABSPEC 1.021  PHURINE 6.0  GLUCOSEU >=500*  HGBUR NEGATIVE  BILIRUBINUR NEGATIVE  KETONESUR NEGATIVE  PROTEINUR NEGATIVE  NITRITE NEGATIVE  LEUKOCYTESUR NEGATIVE      Imaging: US  RENAL Result Date: 09/17/2024 CLINICAL DATA:  [id number] AKI (acute kidney injury) [id number] EXAM: RENAL / URINARY TRACT ULTRASOUND COMPLETE COMPARISON:  None Available. FINDINGS: Right Kidney: Renal measurements: 3.4 x 5.5 x 9.0 cm = volume: 88.4 mL. Echogenicity within normal limits. No hydronephrosis. There are multiple anechoic structures in the right kidney with largest measuring up to 1.8 x 1.9 x 2.6 cm. Majority of these are favored to represent simple cysts. However, not all the renal lesions are seen on this exam. Please refer to MRI abdomen report from the same day for details. Left Kidney: Renal measurements: 4.6 x 4.7 x 8.5 cm = volume: 95.7 mL. Echogenicity within normal limits. No hydronephrosis. There are multiple anechoic structures in the left kidney with largest measuring up to 3.4 x 3.5 x 3.5 cm. Majority of these are favored to represent simple cysts. However, not all the renal lesions are seen on this exam. Please refer to MRI abdomen report from the same day for details. Bladder: Appears normal for degree of bladder distention. Other: None. IMPRESSION: *Multiple  bilateral renal lesions, as described above. No hydronephrosis. Electronically Signed   By: Ree Molt M.D.   On: 09/17/2024 17:38   MR ABDOMEN W WO CONTRAST Result Date: 09/17/2024 CLINICAL DATA:  Right renal mass, patient unable to hold breath on inspiration EXAM: MRI ABDOMEN WITHOUT AND WITH CONTRAST TECHNIQUE: Multiplanar multisequence MR imaging of the abdomen was performed both before and after the administration of intravenous contrast. CONTRAST:  8mL GADAVIST  GADOBUTROL   1 MMOL/ML IV SOLN COMPARISON:  MRI August 21, 2022. FINDINGS: Despite efforts by the technologist and patient, motion artifact is present on today's exam and could not be eliminated. This reduces exam sensitivity and specificity. Lower chest: Heterogeneous signal in the bilateral lung bases likely reflects atelectasis. Hepatobiliary: No significant hepatic steatosis or iron deposition. Nonenhancing lesion in the hepatic dome not well evaluated on today's motion degraded examination. Sludge filled gallbladder is distended without wall thickening or pericholecystic fluid. No biliary ductal dilation. Pancreas: No pancreatic ductal dilation or evidence of acute inflammation. Stable cystic lesion in the pancreatic head measuring 7 mm on image 26/24. Spleen:  No splenomegaly. Adrenals/Urinary Tract: The previously indexed 9 mm enhancing lesion off the posterior interpolar right kidney which measured 9 mm on image 57/15 on MRI August 21, 2022, 9 mm on MRI April 01, 2021 on image 59/19 and 8 mm when remeasured for consistency on MRI April 03, 2020 on image 52/14 of that examination. On today's examination the lesion is likely identified on image 57/19, 59/17 and image 26/24 measuring 7 mm. Numerous bilateral renal lesions of varying degrees of complexity some of which are intrinsically T1 hyperintense and some which are intrinsically T2 hypointense, but are poorly evaluated on this examination which is significantly degraded by motion.  Stomach/Bowel: Stomach is nondistended. No evidence of bowel obstruction. Vascular/Lymphatic: Normal caliber abdominal aorta. No pathologically enlarged abdominal or pelvic lymph nodes. Other:  No significant abdominal free fluid. Musculoskeletal: No suspicious osseous lesion. IMPRESSION: Severely motion degraded examination limits sensitivity and specificity. Within this context: 1. The previously indexed 9 mm enhancing lesion off the posterior interpolar right kidney is not well evaluated on today's examination but appears grossly stable in size. Suggest continued surveillance imaging in short interval, in 6 months, upon resolution of patient's current symptomatology when they are better able to follow commands and maintain breath hold. 2. Numerous bilateral renal lesions of varying degrees of complexity some of which are intrinsically T1 hyperintense are poorly evaluated on this examination which is significantly degraded by motion. Suggest attention on follow-up imaging. 3. 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 Recommend attention on follow-up MRI. 4. Sludge filled gallbladder is distended without wall thickening or pericholecystic fluid, suggest correlation for right upper quadrant tenderness to palpation. Electronically Signed   By: Reyes Holder M.D.   On: 09/17/2024 08:26     Medications:    dextrose     potassium PHOSPHATE IVPB (in mmol)      cinacalcet  30 mg Oral Q breakfast   heparin   5,000 Units Subcutaneous Q8H   insulin  aspart  0-15 Units Subcutaneous TID WC   insulin  aspart  0-5 Units Subcutaneous QHS   potassium chloride   40 mEq Oral BID   acetaminophen  **OR** acetaminophen , HYDROcodone-acetaminophen , ondansetron  **OR** ondansetron  (ZOFRAN ) IV  Assessment/ Plan:  Ms. Kevonna Nolte is a 83 y.o.  female  with past medical history including CKD 3b diabetes, hypertension, proteinuria, and hypercalcemia. Patient presents to the  ED with altered mental status from SNF and has been admitted for Hypercalcemia [E83.52]     Severe hypercalcemia, serum calcium  14.2 on ED arrival with phosphorus 1.0. Prescribed Cinacalcet outpatient which can cause elevated calcium  levels. Discontinuing cinacalcet. Calcium  improved to 11 today. Continue IV hydration.  2. Hypernatremia  Sodium on arrival 150 but minimum improvement with IV hydration. Will switch to Dextrose 5% at 50ml/hr  3. Hypophosphatemia/Hypokalemia  Potassium 2.8, phos 2.1 Repleted  by primary team  4. 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  Most recent hemoglobin A1c is 5.9 on 05/19/24. Baseline GFR 20s.MRI ordered to follow-up the prior renal mass noted back in 2023.    5. Hypertension with chronic kidney disease   Home regimen includes carvedilol , amlodipine , and torsemide . All currently held. BP 122/65 today.   LOS: 3 Prarthana Parlin P Levorn 10/12/20259:22 AM

## 2024-09-18 NOTE — Plan of Care (Signed)

## 2024-09-18 NOTE — TOC Progression Note (Addendum)
 Transition of Care [facility]) - Progression Note    Patient Details  Name: Shelby [name] MRN: [id number] Date of Birth: 1941/01/09  [facility] CM/SW Contact  Marinda Cooks, RN Phone Number: 09/18/2024, 12:45 PM  Clinical Narrative:    This CM alerted that pt's son Vinie is requesting pgt dc home with HH instead of SNF / Rehab reccommended by PT . This CM attempted to reach pt's son x 2 and was sent to VM, detailed message left with this CM info to return call. TOC will cont to follow dc planning / care coordination and update as applicable.    15:25- This CM updated by prior CM/CSW [place] who spoke with  pt's son and confirmed he is in agreement with pt going to SNF for rehab and not HH at this time per her conversation with him . Bed offers sent and remain in pending status .    Expected Discharge Plan and Services    TBD Social Drivers of Health (SDOH) Interventions SDOH Screenings   Food Insecurity: No Food Insecurity (08/18/2024)  Housing: Unknown (08/18/2024)  Transportation Needs: No Transportation Needs (08/18/2024)  Utilities: Not At Risk (08/18/2024)  Alcohol Screen: Low Risk  (12/20/2018)  Financial Resource Strain: Low Risk  (03/01/2024)   Received from [facility]  Physical Activity: Inactive (03/19/2021)   Received from [facility]  Social Connections: Socially Isolated (08/18/2024)  Stress: No Stress Concern Present (03/19/2021)   Received from [facility]  Tobacco Use: Low Risk  (09/15/2024)    Readmission Risk Interventions     No data to display

## 2024-09-19 DIAGNOSIS — Z789 Other specified health status: Secondary | ICD-10-CM | POA: Diagnosis not present

## 2024-09-19 DIAGNOSIS — Z515 Encounter for palliative care: Secondary | ICD-10-CM | POA: Diagnosis not present

## 2024-09-19 DIAGNOSIS — Z66 Do not resuscitate: Secondary | ICD-10-CM | POA: Diagnosis not present

## 2024-09-19 LAB — URINE CULTURE: Culture: >=100000 — AB

## 2024-09-19 LAB — CBC
HCT: 37.7 % (ref 36.0–46.0)
Hemoglobin: 12.3 g/dL (ref 12.0–15.0)
MCH: 30.9 pg (ref 26.0–34.0)
MCHC: 32.6 g/dL (ref 30.0–36.0)
MCV: 94.7 fL (ref 80.0–100.0)
Platelets: 133 K/uL — ABNORMAL LOW (ref 150–400)
RBC: 3.98 MIL/uL (ref 3.87–5.11)
RDW: 15.9 % — ABNORMAL HIGH (ref 11.5–15.5)
WBC: 6.4 K/uL (ref 4.0–10.5)
nRBC: 0 % (ref 0.0–0.2)

## 2024-09-19 LAB — PHOSPHORUS: Phosphorus: 2.3 mg/dL — ABNORMAL LOW (ref 2.5–4.6)

## 2024-09-19 LAB — PTH, INTACT AND CALCIUM
Calcium, Total (PTH): 12.4 mg/dL — ABNORMAL HIGH (ref 8.7–10.3)
PTH: 77 pg/mL — ABNORMAL HIGH (ref 15–65)

## 2024-09-19 LAB — BASIC METABOLIC PANEL WITH GFR
Anion gap: 7 (ref 5–15)
BUN: 26 mg/dL — ABNORMAL HIGH (ref 8–23)
CO2: 30 mmol/L (ref 22–32)
Calcium: 10.2 mg/dL (ref 8.9–10.3)
Chloride: 107 mmol/L (ref 98–111)
Creatinine, Ser: 1.29 mg/dL — ABNORMAL HIGH (ref 0.44–1.00)
GFR, Estimated: 42 mL/min — ABNORMAL LOW (ref >60)
Glucose, Bld: 102 mg/dL — ABNORMAL HIGH (ref 70–99)
Potassium: 4.1 mmol/L (ref 3.5–5.1)
Sodium: 144 mmol/L (ref 135–145)

## 2024-09-19 LAB — GLUCOSE, CAPILLARY
Glucose-Capillary: 74 mg/dL (ref 70–99)
Glucose-Capillary: 135 mg/dL — ABNORMAL HIGH (ref 70–99)
Glucose-Capillary: 105 mg/dL — ABNORMAL HIGH (ref 70–99)
Glucose-Capillary: 88 mg/dL (ref 70–99)

## 2024-09-19 LAB — MAGNESIUM: Magnesium: 1.6 mg/dL — ABNORMAL LOW (ref 1.7–2.4)

## 2024-09-19 MED ORDER — K PHOS MONO-SOD PHOS DI & MONO 155-852-130 MG PO TABS
500.0000 mg | ORAL_TABLET | Freq: Four times a day (QID) | ORAL | Status: AC
  Administered 2024-09-19 (×2): 500 mg via ORAL
  Filled 2024-09-19 (×2): qty 2

## 2024-09-19 MED ORDER — MAGNESIUM SULFATE 2 GM/50ML IV SOLN
2.0000 g | Freq: Once | INTRAVENOUS | Status: AC
  Administered 2024-09-19: 2 g via INTRAVENOUS
  Filled 2024-09-19: qty 50

## 2024-09-19 NOTE — Progress Notes (Addendum)
 Palliative Care Progress Note, Assessment & Plan   Patient Name: Shelby [name]       Date: 09/19/2024 DOB: December 10, 1940  Age: 83 y.o. MRN#: [id number] Attending Physician: Von Bellis, MD Primary Care Physician: Steva Clotilda DEL, NP Admit Date: 09/15/2024  Subjective: Feels better today.  Reports rectal pain from hemorrhoids.  Denies chest pain/shortness of breath.  Did not want to eat breakfast today.  HPI: 83 y.o. female  with past medical history significant for dementia, HFrEF, anemia of chronic disease, dilated cardiomyopathy, hyperparathyroidism, DM II, essential hypertension, HLD, OSA, peripheral neuropathy, CKD stage III, history of seizure-like activity and urinary incontinence. Patient presented to ED 09/15/2024 c/o AMS and weakness. Patient family reported in ED that patient was recently treated for UTI, PNA and CHF exacerbation. They requested evaluation due to being more confused than normal.    ED labs significant for sodium 150, CO2 36, glucose 121, BUN 44, creatinine 1.87, calcium  14.2, albumin  3.4 and GFR 27. Phosphorus 1.0. UA negative.    Head CT showed no acute intracranial abnormality with stable atrophy and chronic small vessel disease.    ED vitals 111/88, HR 85, SpO2 92% RA, RR 18, 98.3 F   [facility] was consulted for admission and management of acute metabolic encephalopathy, hypercalcemia, hypernatremia and acute on chronic CKD stage IIIb.   Palliative care was consulted for assistance with goals of care conversations.   Summary of counseling/coordination of care: Extensive chart review completed prior to meeting patient including labs, vital signs, imaging, progress notes, orders, and available advanced directive documents from current and previous encounters.       Latest Ref Rng & Units 09/19/2024    5:41 AM 09/18/2024    5:40 AM 09/17/2024    5:41 AM  CBC  WBC 4.0 - 10.5 K/uL 6.4  5.5  5.6   Hemoglobin 12.0 - 15.0 g/dL 87.6  88.6  87.5   Hematocrit 36.0 - 46.0 % 37.7  35.9  38.4   Platelets 150 - 400 K/uL 133  141  144       Latest Ref Rng & Units 09/19/2024    5:41 AM 09/18/2024    4:52 PM 09/18/2024    5:40 AM  CMP  Glucose 70 - 99 mg/dL 897  852  877   BUN 8 - 23 mg/dL 26  30  31    Creatinine 0.44 - 1.00 mg/dL 8.70  8.56  8.63   Sodium 135 - 145 mmol/L 144  147  148   Potassium 3.5 - 5.1 mmol/L 4.1  3.9  2.8   Chloride 98 - 111 mmol/L 107  106  106   CO2 22 - 32 mmol/L 30  29  34   Calcium  8.9 - 10.3 mg/dL 89.7  88.9  88.9    After reviewing the patient's chart I assessed patient at bedside.  Ill-appearing, elderly female lying in bed.  She is alert and oriented to self, location and date of birth.  She is unable to state year or why she is here.  Patient is more interactive during visit than in previous visits.  She is able to participate in some conversation.  Respirations are even and unlabored.  She is in no distress.  No family at bedside.  Per TOC note, patient's son requesting transfer to rehab facility at discharge.  Physical Exam Vitals and nursing note reviewed.  Constitutional:      Appearance: She is ill-appearing.  HENT:     Head: Normocephalic and atraumatic.     Mouth/Throat:     Mouth: Mucous membranes are dry.  Pulmonary:     Effort: Pulmonary effort is normal. No respiratory distress.  Musculoskeletal:     Right lower leg: No edema.     Left lower leg: No edema.  Skin:    General: Skin is warm and dry.  Neurological:     Mental Status: She is alert. She is disoriented.     Motor: Weakness present.     Recommendations/Plan: DNR with intervention    Continue current supportive interventions Plan to discharge to rehab  PMT will follow peripherally and remain available for needs     Total Time 50  minutes   Time spent includes: Detailed review of medical records (labs, imaging, vital signs), medically appropriate exam (mental status, respiratory, cardiac, skin), discussed with treatment team, counseling and educating patient, family and staff, documenting clinical information, medication management and coordination of care.     Devere Sacks, [facility] Palliative Medicine Team  09/19/2024 10:14 AM  Office [phone]  Pager [phone]

## 2024-09-19 NOTE — Care Management Important Message (Signed)
 Important Message  Patient Details  Name: Shelby [name] MRN: [id number] Date of Birth: 1941/04/23   Important Message Given:  Yes - Medicare IM     Harles Evetts W, CMA 09/19/2024, 1:46 PM

## 2024-09-19 NOTE — Progress Notes (Signed)
 [facility]  ROUNDING NOTE   Subjective:   Patient seen sitting up in bed Ill appearing Reakfast tray at bedside, untouched States she doesn't feel well today Room air  S Calcium  10.2  Objective:  Vital signs in last 24 hours:  Temp:  [97.5 F (36.4 C)-98.5 F (36.9 C)] 98.5 F (36.9 C) (10/13 0832) Pulse Rate:  [72-90] 81 (10/13 0832) Resp:  [14-18] 18 (10/13 0832) BP: (111-133)/(67-80) 123/80 (10/13 0832) SpO2:  [93 %-100 %] 100 % (10/13 0832)  Weight change:  Filed Weights   09/16/24 0800  Weight: 84.2 kg    Intake/Output: I/O last 3 completed shifts: In: 1889.1 [I.V.:1756.6; IV Piggyback:132.5] Out: 600 [Urine:600]   Intake/Output this shift:  No intake/output data recorded.  Physical Exam: General: NAD, ill appearing  Head: Normocephalic  Eyes: Anicteric,  Lungs:  Clear to auscultation  Heart: Regular rate  Abdomen:  Soft  Extremities:  no peripheral edema.  Neurologic: alert  Skin: warm    Basic Metabolic Panel: Recent Labs  Lab 09/16/24 0500 09/16/24 1612 09/17/24 0541 09/17/24 0746 09/17/24 2042 09/18/24 0540 09/18/24 1652 09/19/24 0541  NA 150*   < > 148* 149* 146* 148* 147* 144  K 3.7   < > 3.2* 3.5 3.1* 2.8* 3.9 4.1  CL 104   < > 104 104 106 106 106 107  CO2 32   < > 32 34* 32 34* 29 30  GLUCOSE 157*   < > 102* 102* 118* 122* 147* 102*  BUN 46*   < > 39* 38* 39* 31* 30* 26*  CREATININE 1.65*   < > 1.52* 1.61* 1.39* 1.36* 1.43* 1.29*  CALCIUM  13.5*   < > 12.3*  12.4* 12.4* 11.3* 11.0* 11.0* 10.2  MG 2.3  --  2.2  --   --  1.8  --  1.6*  PHOS 1.5*  --  2.4*  --   --  2.1* 1.9* 2.3*   < > = values in this interval not displayed.    Liver Function Tests: Recent Labs  Lab 09/15/24 1440 09/16/24 0500  AST 37 37  ALT 26 23  ALKPHOS 59 61  BILITOT 0.7 0.6  PROT 7.3 6.8  ALBUMIN  3.4* 3.2*   No results for input(s): LIPASE, AMYLASE in the last 168 hours. No results for input(s): AMMONIA in the last 168  hours.  CBC: Recent Labs  Lab 09/15/24 1440 09/16/24 0500 09/17/24 0541 09/18/24 0540 09/19/24 0541  WBC 8.0 6.7 5.6 5.5 6.4  HGB 13.5 13.9 12.4 11.3* 12.3  HCT 44.5 44.7 38.4 35.9* 37.7  MCV 98.7 97.4 96.0 96.5 94.7  PLT 199 156 144* 141* 133*    Cardiac Enzymes: No results for input(s): CKTOTAL, CKMB, CKMBINDEX, TROPONINI in the last 168 hours.  BNP: Invalid input(s): POCBNP  CBG: Recent Labs  Lab 09/18/24 1146 09/18/24 1732 09/18/24 2010 09/18/24 2117 09/19/24 0827  GLUCAP 102* 135* 123* 128* 74    Microbiology: Results for orders placed or performed during the hospital encounter of 08/18/24  Resp panel by RT-PCR (RSV, Flu A&B, Covid) Anterior Nasal Swab     Status: None   Collection Time: 08/18/24  1:12 PM   Specimen: Anterior Nasal Swab  Result Value Ref Range Status   SARS Coronavirus 2 by RT PCR NEGATIVE NEGATIVE Final    Comment: (NOTE) SARS-CoV-2 target nucleic acids are NOT DETECTED.  The SARS-CoV-2 RNA is generally detectable in upper respiratory specimens during the acute phase of infection. The lowest  concentration of SARS-CoV-2 viral copies this assay can detect is 138 copies/mL. A negative result does not preclude SARS-Cov-2 infection and should not be used as the sole basis for treatment or other patient management decisions. A negative result may occur with  improper specimen collection/handling, submission of specimen other than nasopharyngeal swab, presence of viral mutation(s) within the areas targeted by this assay, and inadequate number of viral copies(<138 copies/mL). A negative result must be combined with clinical observations, patient history, and epidemiological information. The expected result is Negative.  Fact Sheet for Patients:  BloggerCourse.com  Fact Sheet for Healthcare Providers:  SeriousBroker.it  This test is no t yet approved or cleared by the [place]   FDA and  has been authorized for detection and/or diagnosis of SARS-CoV-2 by FDA under an Emergency Use Authorization (EUA). This EUA will remain  in effect (meaning this test can be used) for the duration of the COVID-19 declaration under Section 564(b)(1) of the Act, 21 U.S.C.section 360bbb-3(b)(1), unless the authorization is terminated  or revoked sooner.       Influenza A by PCR NEGATIVE NEGATIVE Final   Influenza B by PCR NEGATIVE NEGATIVE Final    Comment: (NOTE) The Xpert Xpress SARS-CoV-2/FLU/RSV plus assay is intended as an aid in the diagnosis of influenza from Nasopharyngeal swab specimens and should not be used as a sole basis for treatment. Nasal washings and aspirates are unacceptable for Xpert Xpress SARS-CoV-2/FLU/RSV testing.  Fact Sheet for Patients: BloggerCourse.com  Fact Sheet for Healthcare Providers: SeriousBroker.it  This test is not yet approved or cleared by the [place]  FDA and has been authorized for detection and/or diagnosis of SARS-CoV-2 by FDA under an Emergency Use Authorization (EUA). This EUA will remain in effect (meaning this test can be used) for the duration of the COVID-19 declaration under Section 564(b)(1) of the Act, 21 U.S.C. section 360bbb-3(b)(1), unless the authorization is terminated or revoked.     Resp Syncytial Virus by PCR NEGATIVE NEGATIVE Final    Comment: (NOTE) Fact Sheet for Patients: BloggerCourse.com  Fact Sheet for Healthcare Providers: SeriousBroker.it  This test is not yet approved or cleared by the [place]  FDA and has been authorized for detection and/or diagnosis of SARS-CoV-2 by FDA under an Emergency Use Authorization (EUA). This EUA will remain in effect (meaning this test can be used) for the duration of the COVID-19 declaration under Section 564(b)(1) of the Act, 21 U.S.C. section  360bbb-3(b)(1), unless the authorization is terminated or revoked.  Performed at [facility], [address] [place], [place] [address]   Urine Culture     Status: Abnormal   Collection Time: 08/18/24  1:12 PM   Specimen: Urine, Catheterized  Result Value Ref Range Status   Specimen Description   Final    URINE, CATHETERIZED Performed at [facility], [address] [place], [place] [address]    Special Requests   Final    NONE Performed at [facility], [address] [place], [place] [address]    Culture (A)  Final    >=100,000 COLONIES/mL KLEBSIELLA PNEUMONIAE >=100,000 COLONIES/mL ESCHERICHIA COLI    Report Status 08/21/2024 FINAL  Final   Organism ID, Bacteria KLEBSIELLA PNEUMONIAE (A)  Final   Organism ID, Bacteria ESCHERICHIA COLI (A)  Final      Susceptibility   Escherichia coli - MIC*    AMPICILLIN <=2 SENSITIVE Sensitive     CEFAZOLIN (URINE) Value in next row Sensitive      <=1 SENSITIVEThis  is a modified FDA-approved test that has been validated and its performance characteristics determined by the reporting laboratory.  This laboratory is certified under the Clinical Laboratory Improvement Amendments CLIA as qualified to perform high complexity clinical laboratory testing.    CEFEPIME Value in next row Sensitive      <=1 SENSITIVEThis is a modified FDA-approved test that has been validated and its performance characteristics determined by the reporting laboratory.  This laboratory is certified under the Clinical Laboratory Improvement Amendments CLIA as qualified to perform high complexity clinical laboratory testing.    ERTAPENEM Value in next row Sensitive      <=1 SENSITIVEThis is a modified FDA-approved test that has been validated and its performance characteristics determined by the reporting laboratory.  This laboratory is certified under the Clinical Laboratory Improvement Amendments CLIA as qualified to perform high complexity  clinical laboratory testing.    CEFTRIAXONE  Value in next row Sensitive      <=1 SENSITIVEThis is a modified FDA-approved test that has been validated and its performance characteristics determined by the reporting laboratory.  This laboratory is certified under the Clinical Laboratory Improvement Amendments CLIA as qualified to perform high complexity clinical laboratory testing.    CIPROFLOXACIN Value in next row Sensitive      <=1 SENSITIVEThis is a modified FDA-approved test that has been validated and its performance characteristics determined by the reporting laboratory.  This laboratory is certified under the Clinical Laboratory Improvement Amendments CLIA as qualified to perform high complexity clinical laboratory testing.    GENTAMICIN Value in next row Sensitive      <=1 SENSITIVEThis is a modified FDA-approved test that has been validated and its performance characteristics determined by the reporting laboratory.  This laboratory is certified under the Clinical Laboratory Improvement Amendments CLIA as qualified to perform high complexity clinical laboratory testing.    NITROFURANTOIN Value in next row Sensitive      <=1 SENSITIVEThis is a modified FDA-approved test that has been validated and its performance characteristics determined by the reporting laboratory.  This laboratory is certified under the Clinical Laboratory Improvement Amendments CLIA as qualified to perform high complexity clinical laboratory testing.    TRIMETH/SULFA Value in next row Sensitive      <=1 SENSITIVEThis is a modified FDA-approved test that has been validated and its performance characteristics determined by the reporting laboratory.  This laboratory is certified under the Clinical Laboratory Improvement Amendments CLIA as qualified to perform high complexity clinical laboratory testing.    AMPICILLIN/SULBACTAM Value in next row Sensitive      <=1 SENSITIVEThis is a modified FDA-approved test that has been  validated and its performance characteristics determined by the reporting laboratory.  This laboratory is certified under the Clinical Laboratory Improvement Amendments CLIA as qualified to perform high complexity clinical laboratory testing.    PIP/TAZO Value in next row Sensitive ug/mL     <=4 SENSITIVEThis is a modified FDA-approved test that has been validated and its performance characteristics determined by the reporting laboratory.  This laboratory is certified under the Clinical Laboratory Improvement Amendments CLIA as qualified to perform high complexity clinical laboratory testing.    MEROPENEM Value in next row Sensitive      <=4 SENSITIVEThis is a modified FDA-approved test that has been validated and its performance characteristics determined by the reporting laboratory.  This laboratory is certified under the Clinical Laboratory Improvement Amendments CLIA as qualified to perform high complexity clinical laboratory testing.    * >=100,000 COLONIES/mL ESCHERICHIA COLI  Klebsiella pneumoniae - MIC*    AMPICILLIN Value in next row Resistant      <=4 SENSITIVEThis is a modified FDA-approved test that has been validated and its performance characteristics determined by the reporting laboratory.  This laboratory is certified under the Clinical Laboratory Improvement Amendments CLIA as qualified to perform high complexity clinical laboratory testing.    CEFAZOLIN (URINE) Value in next row Sensitive      2 SENSITIVEThis is a modified FDA-approved test that has been validated and its performance characteristics determined by the reporting laboratory.  This laboratory is certified under the Clinical Laboratory Improvement Amendments CLIA as qualified to perform high complexity clinical laboratory testing.    CEFEPIME Value in next row Sensitive      2 SENSITIVEThis is a modified FDA-approved test that has been validated and its performance characteristics determined by the reporting laboratory.   This laboratory is certified under the Clinical Laboratory Improvement Amendments CLIA as qualified to perform high complexity clinical laboratory testing.    ERTAPENEM Value in next row Sensitive      2 SENSITIVEThis is a modified FDA-approved test that has been validated and its performance characteristics determined by the reporting laboratory.  This laboratory is certified under the Clinical Laboratory Improvement Amendments CLIA as qualified to perform high complexity clinical laboratory testing.    CEFTRIAXONE  Value in next row Sensitive      2 SENSITIVEThis is a modified FDA-approved test that has been validated and its performance characteristics determined by the reporting laboratory.  This laboratory is certified under the Clinical Laboratory Improvement Amendments CLIA as qualified to perform high complexity clinical laboratory testing.    CIPROFLOXACIN Value in next row Sensitive      2 SENSITIVEThis is a modified FDA-approved test that has been validated and its performance characteristics determined by the reporting laboratory.  This laboratory is certified under the Clinical Laboratory Improvement Amendments CLIA as qualified to perform high complexity clinical laboratory testing.    GENTAMICIN Value in next row Sensitive      2 SENSITIVEThis is a modified FDA-approved test that has been validated and its performance characteristics determined by the reporting laboratory.  This laboratory is certified under the Clinical Laboratory Improvement Amendments CLIA as qualified to perform high complexity clinical laboratory testing.    NITROFURANTOIN Value in next row Intermediate      2 SENSITIVEThis is a modified FDA-approved test that has been validated and its performance characteristics determined by the reporting laboratory.  This laboratory is certified under the Clinical Laboratory Improvement Amendments CLIA as qualified to perform high complexity clinical laboratory testing.     TRIMETH/SULFA Value in next row Sensitive      2 SENSITIVEThis is a modified FDA-approved test that has been validated and its performance characteristics determined by the reporting laboratory.  This laboratory is certified under the Clinical Laboratory Improvement Amendments CLIA as qualified to perform high complexity clinical laboratory testing.    AMPICILLIN/SULBACTAM Value in next row Sensitive      2 SENSITIVEThis is a modified FDA-approved test that has been validated and its performance characteristics determined by the reporting laboratory.  This laboratory is certified under the Clinical Laboratory Improvement Amendments CLIA as qualified to perform high complexity clinical laboratory testing.    PIP/TAZO Value in next row Sensitive ug/mL     <=4 SENSITIVEThis is a modified FDA-approved test that has been validated and its performance characteristics determined by the reporting laboratory.  This laboratory is certified under the Clinical Laboratory  Improvement Amendments CLIA as qualified to perform high complexity clinical laboratory testing.    MEROPENEM Value in next row Sensitive      <=4 SENSITIVEThis is a modified FDA-approved test that has been validated and its performance characteristics determined by the reporting laboratory.  This laboratory is certified under the Clinical Laboratory Improvement Amendments CLIA as qualified to perform high complexity clinical laboratory testing.    * >=100,000 COLONIES/mL KLEBSIELLA PNEUMONIAE  Culture, blood (Routine X 2) w Reflex to ID Panel     Status: None   Collection Time: 08/18/24  3:45 PM   Specimen: BLOOD  Result Value Ref Range Status   Specimen Description BLOOD BLOOD RIGHT HAND  Final   Special Requests   Final    BOTTLES DRAWN AEROBIC AND ANAEROBIC Blood Culture results may not be optimal due to an inadequate volume of blood received in culture bottles   Culture   Final    NO GROWTH 5 DAYS Performed at [facility], [address] [place], [place] [address]    Report Status 08/23/2024 FINAL  Final  Culture, blood (Routine X 2) w Reflex to ID Panel     Status: None   Collection Time: 08/18/24  3:45 PM   Specimen: BLOOD  Result Value Ref Range Status   Specimen Description BLOOD BLOOD LEFT HAND  Final   Special Requests   Final    AEROBIC BOTTLE ONLY Blood Culture results may not be optimal due to an inadequate volume of blood received in culture bottles   Culture   Final    NO GROWTH 5 DAYS Performed at [facility], [address], [place], [place] [address]    Report Status 08/23/2024 FINAL  Final    Coagulation Studies: No results for input(s): LABPROT, INR in the last 72 hours.  Urinalysis: Recent Labs    09/16/24 1205 09/18/24 1530  COLORURINE YELLOW* AMBER*  LABSPEC 1.021 1.018  PHURINE 6.0 8.0  GLUCOSEU >=500* >=500*  HGBUR NEGATIVE SMALL*  BILIRUBINUR NEGATIVE NEGATIVE  KETONESUR NEGATIVE NEGATIVE  PROTEINUR NEGATIVE NEGATIVE  NITRITE NEGATIVE NEGATIVE  LEUKOCYTESUR NEGATIVE SMALL*      Imaging: US  RENAL Result Date: 09/17/2024 CLINICAL DATA:  [id number] AKI (acute kidney injury) [id number] EXAM: RENAL / URINARY TRACT ULTRASOUND COMPLETE COMPARISON:  None Available. FINDINGS: Right Kidney: Renal measurements: 3.4 x 5.5 x 9.0 cm = volume: 88.4 mL. Echogenicity within normal limits. No hydronephrosis. There are multiple anechoic structures in the right kidney with largest measuring up to 1.8 x 1.9 x 2.6 cm. Majority of these are favored to represent simple cysts. However, not all the renal lesions are seen on this exam. Please refer to MRI abdomen report from the same day for details. Left Kidney: Renal measurements: 4.6 x 4.7 x 8.5 cm = volume: 95.7 mL. Echogenicity within normal limits. No hydronephrosis. There are multiple anechoic structures in the left kidney with largest measuring up to 3.4 x 3.5 x 3.5 cm. Majority of these are favored to represent simple cysts. However,  not all the renal lesions are seen on this exam. Please refer to MRI abdomen report from the same day for details. Bladder: Appears normal for degree of bladder distention. Other: None. IMPRESSION: *Multiple bilateral renal lesions, as described above. No hydronephrosis. Electronically Signed   By: Ree Molt M.D.   On: 09/17/2024 17:38     Medications:      feeding supplement  237 mL Oral BID BM   heparin   5,000 Units  Subcutaneous Q8H   insulin  aspart  0-15 Units Subcutaneous TID WC   insulin  aspart  0-5 Units Subcutaneous QHS   phosphorus  500 mg Oral QID   acetaminophen  **OR** acetaminophen , HYDROcodone-acetaminophen , ondansetron  **OR** ondansetron  (ZOFRAN ) IV  Assessment/ Plan:  [name] is a 83 y.o.  female  with past medical history including CKD 3b diabetes, hypertension, proteinuria, and hypercalcemia. Patient presents to the ED with altered mental status from SNF and has been admitted for Hypercalcemia [E83.52]     Severe hypercalcemia, serum calcium  14.2 on ED arrival with phosphorus 1.0. Prescribed Cinacalcet outpatient which can cause elvated calcium  levels, now held.  Calcium  10.2 today  2. Hypernatremia  Sodium on arrival 150 but minimum improvement with IV hydration. Sodium corrected to 144  3. Hypophosphatemia/Hypokalemia  Potassium 4.1, phos 2.3 Repleted by primary team  4. 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  Most recent hemoglobin A1c is 5.9 on 05/19/24. Baseline GFR 20s.MRI ordered to follow-up the prior renal mass noted back in 2023. Glucose well controlled.    5. Hypertension with chronic kidney disease   Home regimen includes carvedilol , amlodipine , and torsemide . All currently held. BP stable   LOS: 4 Mariell Nester 10/13/202511:17 AM

## 2024-09-19 NOTE — Plan of Care (Signed)

## 2024-09-19 NOTE — Progress Notes (Signed)
 [facility] Progress Note  Patient: Shelby [name]    [name]  DOA: 09/15/2024     Date of Service: the patient was seen and examined on 09/19/2024  Chief Complaint  Patient presents with   Altered Mental Status   Weakness   Brief hospital course: Laiba Fuerte is a 83 y.o. female with medical history significant of Hyperparathyroidism, dementia, heart failure with reduced ejection fraction, anemia of chronic disease, dilated cardiomyopathy, type 2 diabetes, essential hypertension, hyperlipidemia, obstructive sleep apnea, peripheral neuropathy, chronic kidney disease stage III, history of seizure-like activity and urinary incontinence who was brought in from [facility] by family wanting her to be checked.  Patient was recently diagnosed with UTI pneumonia and CHF exacerbation.  She has been taking medications at the facility.  Family visited and noticed she is more confused.  Patient is currently unable to give history.  She also altered.  She responds to voice by opening her eyes.  Workup performed showed significant change from last month.  Her sodium is 150, calcium  14.2 and phosphorus only 1.  Patient also has known history of chronic kidney disease creatinine is 1.87.  Head CT without contrast showed no acute findings.  Nephrology consulted and recommends admission for treatment of hypercalcemia and hyponatremia.    Assessment and Plan:  # Acute metabolic encephalopathy: Most likely multifactorial.  Hypercalcemia and hypernatremia. UA negative  Continue supportive care, delirium precautions Fall precautions   # Hypercalcemia: Due to known history of hyperparathyroidism Continue to monitor on telemetry Continue Sensipar, patient was on it due to known history of hyperparathyroidism Continue IVF D5 0.45 saline for hydration Nephrology consulted, pamidronate order placed Monitor BMP daily Ca 12.4 >11.0>10.2   # Hypernatremia: Most likely due to  dehydration.  Continue D5 half NS IV hydration Na 144 improving  # Hypomagnesemia, mag repleted. # Hypokalemia, potassium repleted.  Resolved # Hypophosphatemia secondary to nutritional deficiency. Phos repleted. Check electrolytes daily  # Type 2 diabetes: Initiate sliding scale insulin .   # Essential hypertension: Off medications at this time Continue to monitor BP and titrate medications accordingly    # Acute on chronic kidney disease stage IIIb: Renal function appears to be at baseline.  Continue to monitor Bladder scan negative for urinary retention US  Renal: Multiple bilateral renal lesions, as described above. No hydronephrosis.    # Hyperlipidemia: Will resume statin when stable.    # Dilated cardiomyopathy: Appears compensated.  Hold off medications for now, resume when patient is stable.   # Dementia: Currently obtunded. # obstructive sleep apnea: Consider CPAP at night.  # Obesity class II Body mass index is 36.25 kg/m.  Interventions: Calorie restricted diet and daily exercise will be advised to lose body weight.  Lifestyle modification needs to be discussed with patient has severe dementia    Wound 08/18/24 1821 Pressure Injury Buttocks Mid Stage 2 -  Partial thickness loss of dermis presenting as a shallow open injury with a red, pink wound bed without slough. (Active)     Diet: Dysphagia 3 diet DVT Prophylaxis: Subcutaneous Heparin     Advance goals of care discussion: Full code  Family Communication: family was present at bedside, at the time of interview.  The pt provided permission to discuss medical plan with the family. Opportunity was given to ask question and all questions were answered satisfactorily.   Disposition:  Pt is from SNF, admitted with AME due to hypercalcemia, still has elevated calcium  level, which precludes a safe discharge. Discharge to  SNF, when stable, may need few days to improve.  Subjective: No significant event overnight,  patient was lying comfortably, stated that she is feeling fine, still has low appetite, she is trying to eat. Unable to offer any complaints, lying comfortably.   Physical Exam: General: NAD, lying comfortably Appear in no distress, affect appropriate Eyes: PERRLA ENT: Oral Mucosa Clear, moist  Neck: no JVD,  Cardiovascular: S1 and S2 Present, no Murmur,  Respiratory: good respiratory effort, Bilateral Air entry equal and Decreased, no Crackles, no wheezes Abdomen: Bowel Sound present, Soft and no tenderness,  Skin: no rashes Extremities: no Pedal edema, no calf tenderness Neurologic: without any new focal findings Gait not checked due to patient safety concerns  Vitals:   09/18/24 1701 09/18/24 2006 09/19/24 0445 09/19/24 0832  BP: 111/68 115/72 133/67 123/80  Pulse: 90 76 72 81  Resp: 14 15 16 18   Temp: 98 F (36.7 C) (!) 97.5 F (36.4 C) 97.9 F (36.6 C) 98.5 F (36.9 C)  TempSrc: Oral Oral Oral   SpO2: 95% 93% 97% 100%  Weight:        Intake/Output Summary (Last 24 hours) at 09/19/2024 1434 Last data filed at 09/19/2024 0900 Gross per 24 hour  Intake 301.12 ml  Output --  Net 301.12 ml   Filed Weights   09/16/24 0800  Weight: 84.2 kg    Data Reviewed: I have personally reviewed and interpreted daily labs, tele strips, imagings as discussed above. I reviewed all nursing notes, pharmacy notes, vitals, pertinent old records I have discussed plan of care as described above with RN and patient/family.  CBC: Recent Labs  Lab 09/15/24 1440 09/16/24 0500 09/17/24 0541 09/18/24 0540 09/19/24 0541  WBC 8.0 6.7 5.6 5.5 6.4  HGB 13.5 13.9 12.4 11.3* 12.3  HCT 44.5 44.7 38.4 35.9* 37.7  MCV 98.7 97.4 96.0 96.5 94.7  PLT 199 156 144* 141* 133*   Basic Metabolic Panel: Recent Labs  Lab 09/16/24 0500 09/16/24 1612 09/17/24 0541 09/17/24 0746 09/17/24 2042 09/18/24 0540 09/18/24 1652 09/19/24 0541  NA 150*   < > 148* 149* 146* 148* 147* 144  K 3.7   < >  3.2* 3.5 3.1* 2.8* 3.9 4.1  CL 104   < > 104 104 106 106 106 107  CO2 32   < > 32 34* 32 34* 29 30  GLUCOSE 157*   < > 102* 102* 118* 122* 147* 102*  BUN 46*   < > 39* 38* 39* 31* 30* 26*  CREATININE 1.65*   < > 1.52* 1.61* 1.39* 1.36* 1.43* 1.29*  CALCIUM  13.5*   < > 12.3*  12.4* 12.4* 11.3* 11.0* 11.0* 10.2  MG 2.3  --  2.2  --   --  1.8  --  1.6*  PHOS 1.5*  --  2.4*  --   --  2.1* 1.9* 2.3*   < > = values in this interval not displayed.    Studies: No results found.   Scheduled Meds:  feeding supplement  237 mL Oral BID BM   heparin   5,000 Units Subcutaneous Q8H   insulin  aspart  0-15 Units Subcutaneous TID WC   insulin  aspart  0-5 Units Subcutaneous QHS   phosphorus  500 mg Oral QID   Continuous Infusions:   PRN Meds: acetaminophen  **OR** acetaminophen , HYDROcodone-acetaminophen , ondansetron  **OR** ondansetron  (ZOFRAN ) IV  Time spent: 40 minutes  Author: ELVAN SOR. MD [facility] 09/19/2024 2:34 PM  To reach On-call, see care teams to  locate the attending and reach out to them via [email or web address]. If 7PM-7AM, please contact night-coverage If you still have difficulty reaching the attending provider, please page the [facility] (Director on Call) for [facility] on amion for assistance.

## 2024-09-19 NOTE — Progress Notes (Signed)
 Physical Therapy Treatment Patient Details Name: Shelby [name] MRN: [id number] DOB: 1941/05/05 Today's Date: 09/19/2024   History of Present Illness Pt is an 83 y/o F admitted on 09/15/24 after being brought in by family with reports of more confusion, recently diagnosed with UTI, PNA, & CHF exacerbation. Pt is being treated for acute metabolic encephalopathy, hypercalcemia. Of note, pt with recent admission (08/2024) for L distal fibula fx, closed nondisplaced fx of R lateral malleolus. PMH: hyperparathyroidism, dementia, heart failure with reduced EF, anemia of chronic disease, dilated cardiomyopathy, DM2, HTN, HLD, OSA, peripheral neuropathy, CKD 3, seizure like activity, urinary incontinence    PT Comments  Progressed mobility training with sit<>stand transfers with RW x3 for >30 sec each, cues for upright posture and shifting weight over feet,  Max A +2. Nursing performed hygiene during standing. Pt unable to take steps at this time continuing to be limited by fatigue, general weakness, and pain in LEs.  Continue to recommend post acute rehab <3 hours therapy/day upon d/c.     If plan is discharge home, recommend the following: Two people to help with walking and/or transfers;Two people to help with bathing/dressing/bathroom;Direct supervision/assist for medications management;Assistance with cooking/housework;Direct supervision/assist for financial management;Supervision due to cognitive status;Assistance with feeding   Can travel by private vehicle     No  Equipment Recommendations  Other (comment) (defer to next venue)    Recommendations for Other Services       Precautions / Restrictions Precautions Precautions: Fall Recall of Precautions/Restrictions: Impaired Precaution/Restrictions Comments: Per previous admission (08/2024), per podiatry note, recommend LLE boot for mobility. Restrictions Weight Bearing Restrictions Per Provider Order: No     Mobility  Bed  Mobility Overal bed mobility: Needs Assistance Bed Mobility: Rolling, Supine to Sit, Sit to Supine Rolling: Mod assist   Supine to sit: Max assist, HOB elevated, Used rails Sit to supine: Total assist, +2 for physical assistance   General bed mobility comments: less pain with supine<>sit transitions; pt initiating movement for supine to sit.    Transfers Overall transfer level: Needs assistance Equipment used: Rolling walker (2 wheels) Transfers: Sit to/from Stand Sit to Stand: Max assist, +2 physical assistance, From elevated surface           General transfer comment: sit<>stand x3 for >30  sec each, cues for upright posture and shifting weight over feet.  Nursing performed hygiene during standing.    Ambulation/Gait               General Gait Details: Unable. Limited standing tolerance due to fatigue/weakness   Stairs             Wheelchair Mobility     Tilt Bed    Modified Rankin (Stroke Patients Only)       Balance Overall balance assessment: Needs assistance Sitting-balance support: Single extremity supported, No upper extremity supported, Feet supported Sitting balance-Leahy Scale: Fair Sitting balance - Comments: difficulty reaching outside BOS.  Notable trunk weakness and fatigue.   Standing balance support: Bilateral upper extremity supported, Reliant on assistive device for balance, During functional activity Standing balance-Leahy Scale: Poor Standing balance comment: able to fully clear bottom from mattress to achieve full standing with Max +2                            Communication Communication Communication: Impaired Factors Affecting Communication: Reduced clarity of speech  Cognition Arousal: Lethargic Behavior During Therapy: Flat affect   PT -  Cognitive impairments: No family/caregiver present to determine baseline, History of cognitive impairments                       PT - Cognition Comments: Pt is  alert and agreeable however dino question her insight of situation and actual orientation. Following commands: Impaired Following commands impaired: Follows one step commands inconsistently    Cueing Cueing Techniques: Verbal cues, Tactile cues, Gestural cues  Exercises      General Comments        Pertinent Vitals/Pain Pain Assessment Pain Assessment: Faces Faces Pain Scale: Hurts even more Pain Location: BLE to touch/movement Pain Descriptors / Indicators: Grimacing, Guarding Pain Intervention(s): Limited activity within patient's tolerance, Monitored during session, Repositioned    Home Living                          Prior Function            PT Goals (current goals can now be found in the care plan section) Acute Rehab PT Goals Patient Stated Goal: none stated PT Goal Formulation: Patient unable to participate in goal setting Time For Goal Achievement: 09/30/24 Potential to Achieve Goals: Poor Progress towards PT goals: Progressing toward goals    Frequency    Min 1X/week      PT Plan      Co-evaluation              AM-PAC PT 6 Clicks Mobility   Outcome Measure  Help needed turning from your back to your side while in a flat bed without using bedrails?: Total Help needed moving from lying on your back to sitting on the side of a flat bed without using bedrails?: Total Help needed moving to and from a bed to a chair (including a wheelchair)?: Total Help needed standing up from a chair using your arms (e.g., wheelchair or bedside chair)?: Total Help needed to walk in hospital room?: Total Help needed climbing 3-5 steps with a railing? : Total 6 Click Score: 6    End of Session Equipment Utilized During Treatment: Gait belt Activity Tolerance: Patient limited by fatigue;Patient limited by pain Patient left: in bed;with call bell/phone within reach;with bed alarm set   PT Visit Diagnosis: Muscle weakness (generalized) (M62.81);Other  abnormalities of gait and mobility (R26.89);Difficulty in walking, not elsewhere classified (R26.2)     Time: 8974-8940 PT Time Calculation (min) (ACUTE ONLY): 34 min  Charges:    $Therapeutic Activity: 23-37 mins PT General Charges $$ ACUTE PT VISIT: 1 Visit                     Harland Irving, PTA  09/19/24, 11:19 AM

## 2024-09-19 NOTE — Progress Notes (Signed)
 Pt noted to be choking when drinking water.  Is able to tolerate milk fine.  MD notified and order for speech consult has been placed.

## 2024-09-19 NOTE — Plan of Care (Signed)
   Problem: Safety: Goal: Ability to remain free from injury will improve Outcome: Progressing   Problem: Skin Integrity: Goal: Risk for impaired skin integrity will decrease Outcome: Progressing

## 2024-09-20 LAB — GLUCOSE, CAPILLARY
Glucose-Capillary: 83 mg/dL (ref 70–99)
Glucose-Capillary: 77 mg/dL (ref 70–99)
Glucose-Capillary: 69 mg/dL — ABNORMAL LOW (ref 70–99)
Glucose-Capillary: 93 mg/dL (ref 70–99)
Glucose-Capillary: 93 mg/dL (ref 70–99)
Glucose-Capillary: 70 mg/dL (ref 70–99)

## 2024-09-20 LAB — BASIC METABOLIC PANEL WITH GFR
Anion gap: 8 (ref 5–15)
BUN: 21 mg/dL (ref 8–23)
CO2: 27 mmol/L (ref 22–32)
Calcium: 10 mg/dL (ref 8.9–10.3)
Chloride: 108 mmol/L (ref 98–111)
Creatinine, Ser: 1.06 mg/dL — ABNORMAL HIGH (ref 0.44–1.00)
GFR, Estimated: 53 mL/min — ABNORMAL LOW (ref >60)
Glucose, Bld: 83 mg/dL (ref 70–99)
Potassium: 4.2 mmol/L (ref 3.5–5.1)
Sodium: 143 mmol/L (ref 135–145)

## 2024-09-20 LAB — CBC
HCT: 36.6 % (ref 36.0–46.0)
Hemoglobin: 12 g/dL (ref 12.0–15.0)
MCH: 30.8 pg (ref 26.0–34.0)
MCHC: 32.8 g/dL (ref 30.0–36.0)
MCV: 94.1 fL (ref 80.0–100.0)
Platelets: 171 K/uL (ref 150–400)
RBC: 3.89 MIL/uL (ref 3.87–5.11)
RDW: 16.1 % — ABNORMAL HIGH (ref 11.5–15.5)
WBC: 6.7 K/uL (ref 4.0–10.5)
nRBC: 0 % (ref 0.0–0.2)

## 2024-09-20 LAB — PHOSPHORUS: Phosphorus: 2.7 mg/dL (ref 2.5–4.6)

## 2024-09-20 LAB — MAGNESIUM: Magnesium: 2.1 mg/dL (ref 1.7–2.4)

## 2024-09-20 MED ORDER — DEXTROSE-SODIUM CHLORIDE 5-0.45 % IV SOLN
INTRAVENOUS | Status: DC
Start: 2024-09-20 — End: 2024-09-21

## 2024-09-20 MED ORDER — AMOXICILLIN-POT CLAVULANATE 875-125 MG PO TABS
1.0000 | ORAL_TABLET | Freq: Two times a day (BID) | ORAL | Status: AC
  Administered 2024-09-20 – 2024-09-22 (×5): 1 via ORAL
  Filled 2024-09-20 (×6): qty 1

## 2024-09-20 MED ORDER — SODIUM CHLORIDE 0.45 % IV SOLN: INTRAVENOUS | Status: DC

## 2024-09-20 NOTE — Progress Notes (Signed)
 Occupational Therapy Treatment Patient Details Name: Shelby [name] MRN: [id number] DOB: [birth year] Today's Date: 09/20/2024   History of present illness Pt is an 83 y/o F admitted on 09/15/24 after being brought in by family with reports of more confusion, recently diagnosed with UTI, PNA, & CHF exacerbation. Pt is being treated for acute metabolic encephalopathy, hypercalcemia. Of note, pt with recent admission (08/2024) for L distal fibula fx, closed nondisplaced fx of R lateral malleolus. PMH: hyperparathyroidism, dementia, heart failure with reduced EF, anemia of chronic disease, dilated cardiomyopathy, DM2, HTN, HLD, OSA, peripheral neuropathy, CKD 3, seizure like activity, urinary incontinence   OT comments  Pt seen for OT treatment on this date. Upon arrival to room pt asleep, easily wakes to voice. Pt requires TOTALA for LB dressing at bed level, doesn't attempt to move feet when donning socks. Pt attempt sidelying to sitting on the EOB, however pt resistive to all movements, pushing back against author. Pt boosted up in bed with MAXA+2, able to roll to left side with heavy cues and MODA. Pt was tearful at the end of session, unable to verbalize her feelings, however pt comforted and left with all needs in reach and a warm blanket. Pt making progress toward goals, will continue to follow POC. Discharge recommendation remains appropriate.        If plan is discharge home, recommend the following:  Two people to help with walking and/or transfers;A lot of help with bathing/dressing/bathroom;Direct supervision/assist for medications management;Supervision due to cognitive status;Direct supervision/assist for financial management;Assist for transportation;Assistance with cooking/housework;Assistance with feeding;Help with stairs or ramp for entrance   Equipment Recommendations  Hospital bed;Hoyer lift    Recommendations for Other Services      Precautions / Restrictions  Precautions Precautions: Fall Recall of Precautions/Restrictions: Impaired Precaution/Restrictions Comments: Per previous admission (08/2024), per podiatry note, recommend LLE boot for mobility. Restrictions Weight Bearing Restrictions Per Provider Order: No Other Position/Activity Restrictions: Per podiatry note on 9/5: Advise use of a boot for weight-bearing activities such as transferring to a wheelchair or using the restroom.  - Instruct to rest and elevate the leg to aid in healing.  - Apply a compression wrap to the ankle to provide support and reduce swelling.       Mobility Bed Mobility Overal bed mobility: Needs Assistance Bed Mobility: Supine to Sit, Sidelying to Sit, Rolling Rolling: Mod assist Sidelying to sit: Total assist       General bed mobility comments: Resistive to all movements, verbalized wanting to get out of bed but unable to initiate movement or allow physcial assistance.    Transfers                         Balance Overall balance assessment: Needs assistance Sitting-balance support: Single extremity supported, No upper extremity supported, Feet supported Sitting balance-Leahy Scale: Poor Sitting balance - Comments: Pt attempted pt reach for bed rails but unwillinging to support self.                                   ADL either performed or assessed with clinical judgement   ADL Overall ADL's : Needs assistance/impaired     Grooming: Maximal assistance;Bed level               Lower Body Dressing: Total assistance;Bed level  General ADL Comments: Pt emotional during session when attempting to move.    Communication Communication Communication: Impaired Factors Affecting Communication: Reduced clarity of speech   Cognition Arousal: Alert Behavior During Therapy: Flat affect, Lability Cognition: No family/caregiver present to determine baseline             OT - Cognition Comments:  A/Ox2                 Following commands: Impaired Following commands impaired: Follows one step commands inconsistently      Cueing   Cueing Techniques: Verbal cues, Tactile cues, Gestural cues  Exercises Exercises: Other exercises Other Exercises Other Exercises: Edu: Role of OT, log rolling technique            Pertinent Vitals/ Pain       Pain Assessment Pain Assessment: PAINAD Breathing: normal Negative Vocalization: occasional moan/groan, low speech, negative/disapproving quality Facial Expression: sad, frightened, frown Body Language: tense, distressed pacing, fidgeting Consolability: distracted or reassured by voice/touch PAINAD Score: 4 Pain Location: BLE to touch/movement Pain Descriptors / Indicators: Discomfort, Crying Pain Intervention(s): Limited activity within patient's tolerance, Repositioned                                                          Frequency  Min 2X/week        Progress Toward Goals  OT Goals(current goals can now be found in the care plan section)  Progress towards OT goals: Progressing toward goals  Acute Rehab OT Goals OT Goal Formulation: With patient Time For Goal Achievement: 09/30/24 Potential to Achieve Goals: Fair ADL Goals Pt Will Perform Grooming: with min assist;sitting Pt Will Perform Lower Body Dressing: with min assist;sitting/lateral leans Pt Will Transfer to Toilet: stand pivot transfer;with mod assist;bedside commode   AM-PAC OT 6 Clicks Daily Activity     Outcome Measure   Help from another person eating meals?: None Help from another person taking care of personal grooming?: A Little Help from another person toileting, which includes using toliet, bedpan, or urinal?: A Lot Help from another person bathing (including washing, rinsing, drying)?: A Lot Help from another person to put on and taking off regular upper body clothing?: A Little Help from another person to put on  and taking off regular lower body clothing?: A Lot 6 Click Score: 16    End of Session    OT Visit Diagnosis: Other abnormalities of gait and mobility (R26.89);Muscle weakness (generalized) (M62.81)   Activity Tolerance Patient limited by lethargy   Patient Left in bed;with call bell/phone within reach;with bed alarm set   Nurse Communication Other (comment) (Pt upset in room)        Time: 8466-8448 OT Time Calculation (min): 18 min  Charges: OT General Charges $OT Visit: 1 Visit OT Treatments $Self Care/Home Management : 8-22 mins  Larraine Colas M.S. OTR/L  09/20/24, 4:32 PM

## 2024-09-20 NOTE — Progress Notes (Signed)
 [facility] Progress Note  Patient: Shelby [name]    [name]  DOA: 09/15/2024     Date of Service: the patient was seen and examined on 09/20/2024  Chief Complaint  Patient presents with   Altered Mental Status   Weakness   Brief hospital course: Shelby [name] is a 83 y.o. female with medical history significant of Hyperparathyroidism, dementia, heart failure with reduced ejection fraction, anemia of chronic disease, dilated cardiomyopathy, type 2 diabetes, essential hypertension, hyperlipidemia, obstructive sleep apnea, peripheral neuropathy, chronic kidney disease stage III, history of seizure-like activity and urinary incontinence who was brought in from [facility] by family wanting her to be checked.  Patient was recently diagnosed with UTI pneumonia and CHF exacerbation.  She has been taking medications at the facility.  Family visited and noticed she is more confused.  Patient is currently unable to give history.  She also altered.  She responds to voice by opening her eyes.  Workup performed showed significant change from last month.  Her sodium is 150, calcium  14.2 and phosphorus only 1.  Patient also has known history of chronic kidney disease creatinine is 1.87.  Head CT without contrast showed no acute findings.  Nephrology consulted and recommends admission for treatment of hypercalcemia and hyponatremia.    Assessment and Plan:  # Acute metabolic encephalopathy: Most likely multifactorial.  Hypercalcemia and hypernatremia. UA negative  Continue supportive care, delirium precautions Fall precautions   # Hypercalcemia: Due to known history of hyperparathyroidism Continue to monitor on telemetry Continue Sensipar, patient was on it due to known history of hyperparathyroidism Continue IVF 1/2 NS for hydration Nephrology consulted, pamidronate order placed Monitor BMP daily Ca 12.4 >11.0>10.2>10.0   # Hypernatremia: Most likely due to  dehydration.  Continue D5 half NS IV hydration Na 143 improving  # Hypomagnesemia, mag repleted. # Hypokalemia, potassium repleted.  Resolved # Hypophosphatemia secondary to nutritional deficiency. Phos repleted. Check electrolytes daily  # Type 2 diabetes: Initiate sliding scale insulin .   # Essential hypertension: Off medications at this time Continue to monitor BP and titrate medications accordingly    # Acute on chronic kidney disease stage IIIb: Renal function appears to be at baseline.  Continue to monitor Bladder scan negative for urinary retention US  Renal: Multiple bilateral renal lesions, as described above. No hydronephrosis.    # Hyperlipidemia: Will resume statin when stable.    # Dilated cardiomyopathy: Appears compensated.  Hold off medications for now, resume when patient is stable.   # Dementia: Currently obtunded. # obstructive sleep apnea: Consider CPAP at night.  # Obesity class II Body mass index is 36.25 kg/m.  Interventions: Calorie restricted diet and daily exercise will be advised to lose body weight.  Lifestyle modification needs to be discussed with patient has severe dementia    Wound 08/18/24 1821 Pressure Injury Buttocks Mid Stage 2 -  Partial thickness loss of dermis presenting as a shallow open injury with a red, pink wound bed without slough. (Active)     Diet: Dysphagia 3 diet DVT Prophylaxis: Subcutaneous Heparin     Advance goals of care discussion: Full code  Family Communication: family was present at bedside, at the time of interview.  The pt provided permission to discuss medical plan with the family. Opportunity was given to ask question and all questions were answered satisfactorily.   Disposition:  Pt is from SNF, admitted with AME due to hypercalcemia, and noticed hypernatremia, which improved with IV hydration.  Currently patient is stable  to discharge. Discharge to SNF, when bed will be available.   Follow TOC for dispo plan.   Stable to DC  Subjective: No significant event overnight, patient is more awake and alert today, lying comfortably.  Denied any complaints. Patient's son was at bedside, management plan discussed.  Patient is stable to discharge to SNF when bed will be available.   Physical Exam: General: NAD, lying comfortably Appear in no distress, affect appropriate Eyes: PERRLA ENT: Oral Mucosa Clear, moist  Neck: no JVD,  Cardiovascular: S1 and S2 Present, no Murmur,  Respiratory: good respiratory effort, Bilateral Air entry equal and Decreased, no Crackles, no wheezes Abdomen: Bowel Sound present, Soft and no tenderness,  Skin: no rashes Extremities: no Pedal edema, no calf tenderness Neurologic: without any new focal findings Gait not checked due to patient safety concerns  Vitals:   09/19/24 1945 09/20/24 0406 09/20/24 0806 09/20/24 1153  BP: 115/80 125/66 132/78   Pulse: 75 75 67   Resp: 16 16 18    Temp: 98 F (36.7 C) 97.8 F (36.6 C) 98.2 F (36.8 C)   TempSrc: Oral Oral    SpO2: 99% 98% 99%   Weight:      Height:    5' (1.524 m)    Intake/Output Summary (Last 24 hours) at 09/20/2024 1426 Last data filed at 09/20/2024 1300 Gross per 24 hour  Intake 575 ml  Output 500 ml  Net 75 ml   Filed Weights   09/16/24 0800  Weight: 84.2 kg    Data Reviewed: I have personally reviewed and interpreted daily labs, tele strips, imagings as discussed above. I reviewed all nursing notes, pharmacy notes, vitals, pertinent old records I have discussed plan of care as described above with RN and patient/family.  CBC: Recent Labs  Lab 09/16/24 0500 09/17/24 0541 09/18/24 0540 09/19/24 0541 09/20/24 0546  WBC 6.7 5.6 5.5 6.4 6.7  HGB 13.9 12.4 11.3* 12.3 12.0  HCT 44.7 38.4 35.9* 37.7 36.6  MCV 97.4 96.0 96.5 94.7 94.1  PLT 156 144* 141* 133* 171   Basic Metabolic Panel: Recent Labs  Lab 09/16/24 0500 09/16/24 1612 09/17/24 0541 09/17/24 0746 09/17/24 2042 09/18/24 0540  09/18/24 1652 09/19/24 0541 09/20/24 0546  NA 150*   < > 148*   < > 146* 148* 147* 144 143  K 3.7   < > 3.2*   < > 3.1* 2.8* 3.9 4.1 4.2  CL 104   < > 104   < > 106 106 106 107 108  CO2 32   < > 32   < > 32 34* 29 30 27   GLUCOSE 157*   < > 102*   < > 118* 122* 147* 102* 83  BUN 46*   < > 39*   < > 39* 31* 30* 26* 21  CREATININE 1.65*   < > 1.52*   < > 1.39* 1.36* 1.43* 1.29* 1.06*  CALCIUM  13.5*   < > 12.3*  12.4*   < > 11.3* 11.0* 11.0* 10.2 10.0  MG 2.3  --  2.2  --   --  1.8  --  1.6* 2.1  PHOS 1.5*  --  2.4*  --   --  2.1* 1.9* 2.3* 2.7   < > = values in this interval not displayed.    Studies: No results found.   Scheduled Meds:  amoxicillin-clavulanate  1 tablet Oral Q12H   feeding supplement  237 mL Oral BID BM   heparin   5,000 Units  Subcutaneous Q8H   insulin  aspart  0-15 Units Subcutaneous TID WC   insulin  aspart  0-5 Units Subcutaneous QHS   Continuous Infusions:   PRN Meds: acetaminophen  **OR** acetaminophen , HYDROcodone-acetaminophen , ondansetron  **OR** ondansetron  (ZOFRAN ) IV  Time spent: 40 minutes  Author: ELVAN SOR. MD [facility] 09/20/2024 2:26 PM  To reach On-call, see care teams to locate the attending and reach out to them via [email or web address]. If 7PM-7AM, please contact night-coverage If you still have difficulty reaching the attending provider, please page the [facility] (Director on Call) for [facility] on amion for assistance.

## 2024-09-20 NOTE — Progress Notes (Signed)
 [facility]  ROUNDING NOTE   Subjective:   Patient seen laying in bed Appears weak and frail Room air  S Calcium  10.0  Objective:  Vital signs in last 24 hours:  Temp:  [97.8 F (36.6 C)-98.2 F (36.8 C)] 98.2 F (36.8 C) (10/14 0806) Pulse Rate:  [67-79] 67 (10/14 0806) Resp:  [16-19] 18 (10/14 0806) BP: (110-132)/(66-80) 132/78 (10/14 0806) SpO2:  [98 %-100 %] 99 % (10/14 0806)  Weight change:  Filed Weights   09/16/24 0800  Weight: 84.2 kg    Intake/Output: I/O last 3 completed shifts: In: 575 [P.O.:575] Out: 500 [Urine:500]   Intake/Output this shift:  No intake/output data recorded.  Physical Exam: General: NAD, ill appearing  Head: Normocephalic  Eyes: Anicteric,  Lungs:  Clear to auscultation  Heart: Regular rate  Abdomen:  Soft  Extremities:  no peripheral edema.  Neurologic: alert  Skin: warm    Basic Metabolic Panel: Recent Labs  Lab 09/16/24 0500 09/16/24 1612 09/17/24 0541 09/17/24 0746 09/17/24 2042 09/18/24 0540 09/18/24 1652 09/19/24 0541 09/20/24 0546  NA 150*   < > 148*   < > 146* 148* 147* 144 143  K 3.7   < > 3.2*   < > 3.1* 2.8* 3.9 4.1 4.2  CL 104   < > 104   < > 106 106 106 107 108  CO2 32   < > 32   < > 32 34* 29 30 27   GLUCOSE 157*   < > 102*   < > 118* 122* 147* 102* 83  BUN 46*   < > 39*   < > 39* 31* 30* 26* 21  CREATININE 1.65*   < > 1.52*   < > 1.39* 1.36* 1.43* 1.29* 1.06*  CALCIUM  13.5*   < > 12.3*  12.4*   < > 11.3* 11.0* 11.0* 10.2 10.0  MG 2.3  --  2.2  --   --  1.8  --  1.6* 2.1  PHOS 1.5*  --  2.4*  --   --  2.1* 1.9* 2.3* 2.7   < > = values in this interval not displayed.    Liver Function Tests: Recent Labs  Lab 09/15/24 1440 09/16/24 0500  AST 37 37  ALT 26 23  ALKPHOS 59 61  BILITOT 0.7 0.6  PROT 7.3 6.8  ALBUMIN  3.4* 3.2*   No results for input(s): LIPASE, AMYLASE in the last 168 hours. No results for input(s): AMMONIA in the last 168 hours.  CBC: Recent Labs  Lab  09/16/24 0500 09/17/24 0541 09/18/24 0540 09/19/24 0541 09/20/24 0546  WBC 6.7 5.6 5.5 6.4 6.7  HGB 13.9 12.4 11.3* 12.3 12.0  HCT 44.7 38.4 35.9* 37.7 36.6  MCV 97.4 96.0 96.5 94.7 94.1  PLT 156 144* 141* 133* 171    Cardiac Enzymes: No results for input(s): CKTOTAL, CKMB, CKMBINDEX, TROPONINI in the last 168 hours.  BNP: Invalid input(s): POCBNP  CBG: Recent Labs  Lab 09/19/24 0827 09/19/24 1154 09/19/24 1705 09/19/24 2001 09/20/24 0825  GLUCAP 74 135* 105* 88 83    Microbiology: Results for orders placed or performed during the hospital encounter of 09/15/24  Urine Culture (for pregnant, neutropenic or urologic patients or patients with an indwelling urinary catheter)     Status: Abnormal   Collection Time: 09/18/24  3:30 PM   Specimen: Urine, Clean Catch  Result Value Ref Range Status   Specimen Description   Final    URINE, CLEAN CATCH Performed  at [facility], [address] [place], [place] [address]    Special Requests   Final    NONE Performed at [facility], [address] [place], [place] [address]    Culture (A)  Final    >=100,000 COLONIES/mL MULTIPLE SPECIES PRESENT, SUGGEST RECOLLECTION   Report Status 09/19/2024 FINAL  Final    Coagulation Studies: No results for input(s): LABPROT, INR in the last 72 hours.  Urinalysis: Recent Labs    09/18/24 1530  COLORURINE AMBER*  LABSPEC 1.018  PHURINE 8.0  GLUCOSEU >=500*  HGBUR SMALL*  BILIRUBINUR NEGATIVE  KETONESUR NEGATIVE  PROTEINUR NEGATIVE  NITRITE NEGATIVE  LEUKOCYTESUR SMALL*      Imaging: No results found.    Medications:      amoxicillin-clavulanate  1 tablet Oral Q12H   feeding supplement  237 mL Oral BID BM   heparin   5,000 Units Subcutaneous Q8H   insulin  aspart  0-15 Units Subcutaneous TID WC   insulin  aspart  0-5 Units Subcutaneous QHS   acetaminophen  **OR** acetaminophen , HYDROcodone-acetaminophen , ondansetron  **OR**  ondansetron  (ZOFRAN ) IV  Assessment/ Plan:  Shelby [name] is a 83 y.o.  female  with past medical history including CKD 3b diabetes, hypertension, proteinuria, and hypercalcemia. Patient presents to the ED with altered mental status from SNF and has been admitted for Hypercalcemia [E83.52]     Severe hypercalcemia, serum calcium  14.2 on ED arrival with phosphorus 1.0. Prescribed Cinacalcet outpatient, will restart at 60mg  daily. S Calcium  10.0  2. Hypernatremia  Sodium on arrival 150 but minimum improvement with IV hydration. Sodium 143  3. Hypophosphatemia/Hypokalemia  Potassium 4.2, phos 2.7. Corrected by primary team  4. 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  Most recent hemoglobin A1c is 5.9 on 05/19/24. Baseline GFR 20s.MRI ordered to follow-up the prior renal mass noted back in 2023.  Primary team to continue management   5. Hypertension with chronic kidney disease   Home regimen includes carvedilol , amlodipine , and torsemide . All currently held. BP 132/78   LOS: 5 Shelby [name] 10/14/202512:06 PM

## 2024-09-20 NOTE — Plan of Care (Signed)

## 2024-09-20 NOTE — Plan of Care (Signed)

## 2024-09-20 NOTE — Progress Notes (Signed)
 Hypoglycemic Event  CBG: 69  Treatment: 4 oz juice/soda  Symptoms: None  Follow-up CBG: Time:1745 CBG Result:93  Possible Reasons for Event: Inadequate meal intake  Comments/MD notified:Dr. Von Donnell DELENA Joesph

## 2024-09-21 LAB — CBC
HCT: 37.8 % (ref 36.0–46.0)
Hemoglobin: 12.3 g/dL (ref 12.0–15.0)
MCH: 30.5 pg (ref 26.0–34.0)
MCHC: 32.5 g/dL (ref 30.0–36.0)
MCV: 93.8 fL (ref 80.0–100.0)
Platelets: 178 K/uL (ref 150–400)
RBC: 4.03 MIL/uL (ref 3.87–5.11)
RDW: 15.9 % — ABNORMAL HIGH (ref 11.5–15.5)
WBC: 6.7 K/uL (ref 4.0–10.5)
nRBC: 0 % (ref 0.0–0.2)

## 2024-09-21 LAB — BASIC METABOLIC PANEL WITH GFR
Anion gap: 7 (ref 5–15)
BUN: 19 mg/dL (ref 8–23)
CO2: 24 mmol/L (ref 22–32)
Calcium: 9.5 mg/dL (ref 8.9–10.3)
Chloride: 107 mmol/L (ref 98–111)
Creatinine, Ser: 0.98 mg/dL (ref 0.44–1.00)
GFR, Estimated: 58 mL/min — ABNORMAL LOW (ref >60)
Glucose, Bld: 102 mg/dL — ABNORMAL HIGH (ref 70–99)
Potassium: 4.1 mmol/L (ref 3.5–5.1)
Sodium: 138 mmol/L (ref 135–145)

## 2024-09-21 LAB — GLUCOSE, CAPILLARY
Glucose-Capillary: 87 mg/dL (ref 70–99)
Glucose-Capillary: 87 mg/dL (ref 70–99)
Glucose-Capillary: 68 mg/dL — ABNORMAL LOW (ref 70–99)
Glucose-Capillary: 120 mg/dL — ABNORMAL HIGH (ref 70–99)
Glucose-Capillary: 80 mg/dL (ref 70–99)

## 2024-09-21 LAB — MAGNESIUM: Magnesium: 2 mg/dL (ref 1.7–2.4)

## 2024-09-21 LAB — PHOSPHORUS: Phosphorus: 2 mg/dL — ABNORMAL LOW (ref 2.5–4.6)

## 2024-09-21 MED ORDER — MEGESTROL ACETATE 400 MG/10ML PO SUSP
400.0000 mg | Freq: Every day | ORAL | Status: DC
  Administered 2024-09-21 – 2024-10-04 (×11): 400 mg via ORAL
  Filled 2024-09-21 (×14): qty 10

## 2024-09-21 MED ORDER — ENSURE PLUS HIGH PROTEIN PO LIQD
237.0000 mL | Freq: Three times a day (TID) | ORAL | Status: DC
  Administered 2024-09-21 – 2024-09-26 (×13): 237 mL via ORAL

## 2024-09-21 MED ORDER — POTASSIUM & SODIUM PHOSPHATES 280-160-250 MG PO PACK
1.0000 | PACK | Freq: Three times a day (TID) | ORAL | Status: AC
  Administered 2024-09-21 (×2): 1 via ORAL
  Filled 2024-09-21 (×2): qty 1

## 2024-09-21 NOTE — Plan of Care (Signed)

## 2024-09-21 NOTE — TOC Progression Note (Signed)
 Transition of Care [facility]) - Progression Note    Patient Details  Name: Shelby [name] MRN: [id number] Date of Birth: [birth year]  Transition of [facility]) CM/SW Contact  [name] Fuse, RN Phone Number: 09/21/2024, 3:15 PM  Clinical Narrative:     TOC spoke with the patient's niece her preference is for the patient to return home because she didn't think the patient benefited from rehab in the past; however, she will defer to the patient's son Vinie. TOC spoke with Vinie and advised the patient has a bed offer at [facility]. Vinie would like to accept. [facility] selected in the hub and auth obtained. Approved PlanAuthID: [id number] Dates: 10/15-10/17/2025 Next Review Date: 09/23/2024   [facility] spoke with Tammy at [facility] and they can accept the patient tomorrow.                     Expected Discharge Plan and Services                                               Social Drivers of Health (SDOH) Interventions SDOH Screenings   Food Insecurity: No Food Insecurity (08/18/2024)  Housing: Unknown (08/18/2024)  Transportation Needs: No Transportation Needs (08/18/2024)  Utilities: Not At Risk (08/18/2024)  Alcohol Screen: Low Risk  (12/20/2018)  Financial Resource Strain: Low Risk  (03/01/2024)   Received from [facility]  Physical Activity: Inactive (03/19/2021)   Received from [facility]  Social Connections: Socially Isolated (08/18/2024)  Stress: No Stress Concern Present (03/19/2021)   Received from [facility]  Tobacco Use: Low Risk  (09/15/2024)    Readmission Risk Interventions     No data to display

## 2024-09-21 NOTE — Progress Notes (Signed)
 [facility] Progress Note  Patient: Shelby [name]    [name]  DOA: 09/15/2024     Date of Service: the patient was seen and examined on 09/21/2024  Chief Complaint  Patient presents with   Altered Mental Status   Weakness   Brief hospital course: Shelby [name] is a 83 y.o. female with medical history significant of Hyperparathyroidism, dementia, heart failure with reduced ejection fraction, anemia of chronic disease, dilated cardiomyopathy, type 2 diabetes, essential hypertension, hyperlipidemia, obstructive sleep apnea, peripheral neuropathy, chronic kidney disease stage III, history of seizure-like activity and urinary incontinence who was brought in from [facility] by family wanting her to be checked.  Patient was recently diagnosed with UTI pneumonia and CHF exacerbation.  She has been taking medications at the facility.  Family visited and noticed she is more confused.  Patient is currently unable to give history.  She also altered.  She responds to voice by opening her eyes.  Workup performed showed significant change from last month.  Her sodium is 150, calcium  14.2 and phosphorus only 1.  Patient also has known history of chronic kidney disease creatinine is 1.87.  Head CT without contrast showed no acute findings.  Nephrology consulted and recommends admission for treatment of hypercalcemia and hyponatremia.    Assessment and Plan:  # Acute metabolic encephalopathy: Most likely multifactorial.  Hypercalcemia and hypernatremia. UA negative  Continue supportive care, delirium precautions Fall precautions   # Hypercalcemia: Due to known history of hyperparathyroidism Continue to monitor on telemetry Continue Sensipar, patient was on it due to known history of hyperparathyroidism Continue IVF 1/2 NS for hydration Nephrology consulted, pamidronate order placed Monitor BMP daily Ca 12.4 >11.0>10.2>10.0   # Hypernatremia: Most likely due to  dehydration.  Continue D5 half NS IV hydration Na 143 improving  # Hypomagnesemia, mag repleted. # Hypokalemia, potassium repleted.  Resolved # Hypophosphatemia secondary to nutritional deficiency. Phos repleted. Check electrolytes daily  # Type 2 diabetes: Initiate sliding scale insulin .   # Essential hypertension: Off medications at this time Continue to monitor BP and titrate medications accordingly    # Acute on chronic kidney disease stage IIIb: Renal function appears to be at baseline.  Continue to monitor Bladder scan negative for urinary retention US  Renal: Multiple bilateral renal lesions, as described above. No hydronephrosis.    # Hyperlipidemia: Will resume statin when stable.    # Dilated cardiomyopathy: Appears compensated.  Hold off medications for now, resume when patient is stable.   # Anorexia, poor appetite and frequent episodes of hypoglycemia Follow hypoglycemia protocol Encourage oral diet, continue ensures Started Megace to stimulate appetite Assist with feeding  # Pyuria, urine culture growing multiple species. Empirically started Augmentin twice daily for 3 days.   # Dementia: Currently obtunded. # obstructive sleep apnea: Consider CPAP at night.  # Obesity class II Body mass index is 36.25 kg/m.  Interventions: Calorie restricted diet and daily exercise will be advised to lose body weight.  Lifestyle modification needs to be discussed with patient has severe dementia    Wound 08/18/24 1821 Pressure Injury Buttocks Mid Stage 2 -  Partial thickness loss of dermis presenting as a shallow open injury with a red, pink wound bed without slough. (Active)     Diet: Dysphagia 3 diet DVT Prophylaxis: Subcutaneous Heparin     Advance goals of care discussion: Full code  Family Communication: family was present at bedside, at the time of interview.  The pt provided permission to discuss medical  plan with the family. Opportunity was given to ask  question and all questions were answered satisfactorily.   Disposition:  Pt is from SNF, admitted with AME due to hypercalcemia, and noticed hypernatremia, which improved with IV hydration.  Currently patient is stable to discharge. Discharge to SNF, when bed will be available.   10/15 as per TOC patient can go to SNF [facility] tomorrow a.m.   Subjective: No significant event overnight, patient is awake and alert, stated that she is feeling fine, denied any active issues. Patient's son was at bedside, management plan discussed.   Physical Exam: General: NAD, lying comfortably Appear in no distress, affect appropriate Eyes: PERRLA ENT: Oral Mucosa Clear, moist  Neck: no JVD,  Cardiovascular: S1 and S2 Present, no Murmur,  Respiratory: good respiratory effort, Bilateral Air entry equal and Decreased, no Crackles, no wheezes Abdomen: Bowel Sound present, Soft and no tenderness,  Skin: no rashes Extremities: no Pedal edema, no calf tenderness Neurologic: without any new focal findings Gait not checked due to patient safety concerns  Vitals:   09/20/24 1953 09/21/24 0411 09/21/24 0807 09/21/24 1603  BP: 102/72 120/70 100/80 120/69  Pulse: 70 72 76 70  Resp:   18 18  Temp: 98 F (36.7 C) 98.2 F (36.8 C) 98.3 F (36.8 C) 98.8 F (37.1 C)  TempSrc:      SpO2: 99% 98% 100% 96%  Weight:      Height:        Intake/Output Summary (Last 24 hours) at 09/21/2024 1647 Last data filed at 09/21/2024 0548 Gross per 24 hour  Intake 396.47 ml  Output 350 ml  Net 46.47 ml   Filed Weights   09/16/24 0800  Weight: 84.2 kg    Data Reviewed: I have personally reviewed and interpreted daily labs, tele strips, imagings as discussed above. I reviewed all nursing notes, pharmacy notes, vitals, pertinent old records I have discussed plan of care as described above with RN and patient/family.  CBC: Recent Labs  Lab 09/17/24 0541 09/18/24 0540 09/19/24 0541 09/20/24 0546  09/21/24 0519  WBC 5.6 5.5 6.4 6.7 6.7  HGB 12.4 11.3* 12.3 12.0 12.3  HCT 38.4 35.9* 37.7 36.6 37.8  MCV 96.0 96.5 94.7 94.1 93.8  PLT 144* 141* 133* 171 178   Basic Metabolic Panel: Recent Labs  Lab 09/17/24 0541 09/17/24 0746 09/18/24 0540 09/18/24 1652 09/19/24 0541 09/20/24 0546 09/21/24 0519  NA 148*   < > 148* 147* 144 143 138  K 3.2*   < > 2.8* 3.9 4.1 4.2 4.1  CL 104   < > 106 106 107 108 107  CO2 32   < > 34* 29 30 27 24   GLUCOSE 102*   < > 122* 147* 102* 83 102*  BUN 39*   < > 31* 30* 26* 21 19  CREATININE 1.52*   < > 1.36* 1.43* 1.29* 1.06* 0.98  CALCIUM  12.3*  12.4*   < > 11.0* 11.0* 10.2 10.0 9.5  MG 2.2  --  1.8  --  1.6* 2.1 2.0  PHOS 2.4*  --  2.1* 1.9* 2.3* 2.7 2.0*   < > = values in this interval not displayed.    Studies: No results found.   Scheduled Meds:  amoxicillin-clavulanate  1 tablet Oral Q12H   feeding supplement  237 mL Oral TID BM   heparin   5,000 Units Subcutaneous Q8H   insulin  aspart  0-15 Units Subcutaneous TID WC   insulin  aspart  0-5  Units Subcutaneous QHS   megestrol  400 mg Oral Daily   Continuous Infusions:   PRN Meds: acetaminophen  **OR** acetaminophen , HYDROcodone-acetaminophen , ondansetron  **OR** ondansetron  (ZOFRAN ) IV  Time spent: 40 minutes  Author: ELVAN SOR. MD [facility] 09/21/2024 4:47 PM  To reach On-call, see care teams to locate the attending and reach out to them via [email or web address]. If 7PM-7AM, please contact night-coverage If you still have difficulty reaching the attending provider, please page the [facility] (Director on Call) for [facility] on amion for assistance.

## 2024-09-21 NOTE — Progress Notes (Signed)
 Hypoglycemic Event  CBG: 68  Treatment: 4 oz juice/soda  Symptoms: None  Follow-up CBG: Time:1718 CBG Result:120  Possible Reasons for Event: Inadequate meal intake  Comments/MD notified:Dr Dileep Von Donnell DELENA Joesph

## 2024-09-21 NOTE — Progress Notes (Signed)
 Physical Therapy Treatment Patient Details Name: Shelby [name] MRN: [id number] DOB: [birth year] Today's Date: 09/21/2024   History of Present Illness Pt is an 83 y/o F admitted on 09/15/24 after being brought in by family with reports of more confusion, recently diagnosed with UTI, PNA, & CHF exacerbation. Pt is being treated for acute metabolic encephalopathy, hypercalcemia. Of note, pt with recent admission (08/2024) for L distal fibula fx, closed nondisplaced fx of R lateral malleolus. PMH: hyperparathyroidism, dementia, heart failure with reduced EF, anemia of chronic disease, dilated cardiomyopathy, DM2, HTN, HLD, OSA, peripheral neuropathy, CKD 3, seizure like activity, urinary incontinence    PT Comments  Per Dr. Ladonna f/u visit note on 10/1 Pt is to Maintain R LE NWB as much as possible with weightbearing for transfers only in CAM boot.  Pt and son educated at bedside as well as Education administrator. White board updated. Son to bring CAM boot in tomorrow. Today's PT session focused on sitting EOB for 10+ minutes with Supervision. Pt limited by fatigue requiring repeated cues not to return to supine. Limited tolerance for overall activity, unable to transfer OOB until CAM boot arrives. Upon assisting pt back into bed, pt found to be incontinent of BM, Nursing in to assist.    If plan is discharge home, recommend the following: Two people to help with walking and/or transfers;Two people to help with bathing/dressing/bathroom;Direct supervision/assist for medications management;Assistance with cooking/housework;Direct supervision/assist for financial management;Supervision due to cognitive status;Assistance with feeding   Can travel by private vehicle     No  Equipment Recommendations  Other (comment) (Defer to next level of care)    Recommendations for Other Services       Precautions / Restrictions Precautions Precautions: Fall Recall of Precautions/Restrictions:  Impaired Restrictions Weight Bearing Restrictions Per Provider Order: Yes RLE Weight Bearing Per Provider Order: Non weight bearing Other Position/Activity Restrictions:  (Per Podiatry f/u on 10/1 pt to maintain NWB in CAM boot, wt bearing in boot for transfers only)     Mobility  Bed Mobility Overal bed mobility: Needs Assistance Bed Mobility: Supine to Sit, Sit to Supine     Supine to sit: Mod assist, HOB elevated, Used rails Sit to supine: Mod assist, Used rails (Assist for LE's)   General bed mobility comments:  (Pt sat EOB for 10 minutes)    Transfers                   General transfer comment:  (Unable to transfer until pt's CAM boot arrives tomorrow)    Ambulation/Gait               General Gait Details: R LE wt bearing for transfers only in CAM boot - otherwise NWB   Stairs             Wheelchair Mobility     Tilt Bed    Modified Rankin (Stroke Patients Only)       Balance Overall balance assessment: Needs assistance Sitting-balance support: Single extremity supported, Feet supported Sitting balance-Leahy Scale: Fair Sitting balance - Comments:  (Close supervision, pt fatigues quickly and attempts to lie back down)   Standing balance support: Bilateral upper extremity supported, Reliant on assistive device for balance, During functional activity Standing balance-Leahy Scale: Poor                              Communication Communication Communication: Impaired Factors Affecting Communication: Hearing impaired  Cognition  Arousal: Alert Behavior During Therapy: Lability   PT - Cognitive impairments: No family/caregiver present to determine baseline, History of cognitive impairments                       PT - Cognition Comments:  (Repeated cues to stay on task) Following commands: Impaired Following commands impaired: Follows one step commands inconsistently    Cueing Cueing Techniques: Verbal cues, Tactile  cues, Gestural cues  Exercises Other Exercises Other Exercises:  (Educated on role of acute PT, wt bearing restrictions. Discussed CAM boot with son who will bring it in tomorrow)    General Comments General comments (skin integrity, edema, etc.):  (Pt and staff educated on pt's R LE NWB status and need for CAM boot to arrive for wt bearing during transfers only)      Pertinent Vitals/Pain Pain Assessment Pain Assessment: Faces Faces Pain Scale: Hurts a little bit Pain Location: BR Ankle to touch/movement Pain Descriptors / Indicators: Discomfort, Tender Pain Intervention(s): Limited activity within patient's tolerance    Home Living                          Prior Function            PT Goals (current goals can now be found in the care plan section) Acute Rehab PT Goals Patient Stated Goal: none stated    Frequency    Min 1X/week      PT Plan      Co-evaluation              AM-PAC PT 6 Clicks Mobility   Outcome Measure  Help needed turning from your back to your side while in a flat bed without using bedrails?: Total Help needed moving from lying on your back to sitting on the side of a flat bed without using bedrails?: A Lot Help needed moving to and from a bed to a chair (including a wheelchair)?: Total Help needed standing up from a chair using your arms (e.g., wheelchair or bedside chair)?: Total Help needed to walk in hospital room?: Total Help needed climbing 3-5 steps with a railing? : Total 6 Click Score: 7    End of Session   Activity Tolerance: Patient limited by fatigue Patient left: in bed;with nursing/sitter in room (Nursing in to assist with hygiene) Nurse Communication: Weight bearing status PT Visit Diagnosis: Muscle weakness (generalized) (M62.81);Other abnormalities of gait and mobility (R26.89);Difficulty in walking, not elsewhere classified (R26.2)     Time: 8782-8758 PT Time Calculation (min) (ACUTE ONLY): 24  min  Charges:    $Therapeutic Activity: 23-37 mins PT General Charges $$ ACUTE PT VISIT: 1 Visit                    Darice Bohr, PTA  Darice JAYSON Bohr 09/21/2024, 2:02 PM

## 2024-09-21 NOTE — Progress Notes (Addendum)
 [facility]  ROUNDING NOTE   Subjective:   Patient seen sitting up in bed Alert and oriented Appears well today Continues to have weakness but states is improving  S Calcium  9.5  Objective:  Vital signs in last 24 hours:  Temp:  [98 F (36.7 C)-98.8 F (37.1 C)] 98.3 F (36.8 C) (10/15 0807) Pulse Rate:  [70-77] 76 (10/15 0807) Resp:  [16-18] 18 (10/15 0807) BP: (98-120)/(60-80) 100/80 (10/15 0807) SpO2:  [98 %-100 %] 100 % (10/15 0807)  Weight change:  Filed Weights   09/16/24 0800  Weight: 84.2 kg    Intake/Output: I/O last 3 completed shifts: In: 461.4 [I.V.:461.4] Out: 350 [Urine:350]   Intake/Output this shift:  No intake/output data recorded.  Physical Exam: General: NAD  Head: Normocephalic  Eyes: Anicteric,  Lungs:  Clear to auscultation  Heart: Regular rate  Abdomen:  Soft  Extremities:  no peripheral edema.  Neurologic: alert  Skin: warm    Basic Metabolic Panel: Recent Labs  Lab 09/17/24 0541 09/17/24 0746 09/18/24 0540 09/18/24 1652 09/19/24 0541 09/20/24 0546 09/21/24 0519  NA 148*   < > 148* 147* 144 143 138  K 3.2*   < > 2.8* 3.9 4.1 4.2 4.1  CL 104   < > 106 106 107 108 107  CO2 32   < > 34* 29 30 27 24   GLUCOSE 102*   < > 122* 147* 102* 83 102*  BUN 39*   < > 31* 30* 26* 21 19  CREATININE 1.52*   < > 1.36* 1.43* 1.29* 1.06* 0.98  CALCIUM  12.3*  12.4*   < > 11.0* 11.0* 10.2 10.0 9.5  MG 2.2  --  1.8  --  1.6* 2.1 2.0  PHOS 2.4*  --  2.1* 1.9* 2.3* 2.7 2.0*   < > = values in this interval not displayed.    Liver Function Tests: Recent Labs  Lab 09/15/24 1440 09/16/24 0500  AST 37 37  ALT 26 23  ALKPHOS 59 61  BILITOT 0.7 0.6  PROT 7.3 6.8  ALBUMIN  3.4* 3.2*   No results for input(s): LIPASE, AMYLASE in the last 168 hours. No results for input(s): AMMONIA in the last 168 hours.  CBC: Recent Labs  Lab 09/17/24 0541 09/18/24 0540 09/19/24 0541 09/20/24 0546 09/21/24 0519  WBC 5.6 5.5 6.4  6.7 6.7  HGB 12.4 11.3* 12.3 12.0 12.3  HCT 38.4 35.9* 37.7 36.6 37.8  MCV 96.0 96.5 94.7 94.1 93.8  PLT 144* 141* 133* 171 178    Cardiac Enzymes: No results for input(s): CKTOTAL, CKMB, CKMBINDEX, TROPONINI in the last 168 hours.  BNP: Invalid input(s): POCBNP  CBG: Recent Labs  Lab 09/20/24 1705 09/20/24 1759 09/20/24 1956 09/20/24 2119 09/21/24 0809  GLUCAP 69* 93 93 70 87    Microbiology: Results for orders placed or performed during the hospital encounter of 09/15/24  Urine Culture (for pregnant, neutropenic or urologic patients or patients with an indwelling urinary catheter)     Status: Abnormal   Collection Time: 09/18/24  3:30 PM   Specimen: Urine, Clean Catch  Result Value Ref Range Status   Specimen Description   Final    URINE, CLEAN CATCH Performed at [facility], [address], [place], [place] [address]    Special Requests   Final    NONE Performed at [facility], [address] [place], [place] [address]    Culture (A)  Final    >=100,000 COLONIES/mL MULTIPLE SPECIES  PRESENT, SUGGEST RECOLLECTION   Report Status 09/19/2024 FINAL  Final    Coagulation Studies: No results for input(s): LABPROT, INR in the last 72 hours.  Urinalysis: Recent Labs    09/18/24 1530  COLORURINE AMBER*  LABSPEC 1.018  PHURINE 8.0  GLUCOSEU >=500*  HGBUR SMALL*  BILIRUBINUR NEGATIVE  KETONESUR NEGATIVE  PROTEINUR NEGATIVE  NITRITE NEGATIVE  LEUKOCYTESUR SMALL*      Imaging: No results found.    Medications:      amoxicillin-clavulanate  1 tablet Oral Q12H   feeding supplement  237 mL Oral BID BM   heparin   5,000 Units Subcutaneous Q8H   insulin  aspart  0-15 Units Subcutaneous TID WC   insulin  aspart  0-5 Units Subcutaneous QHS   megestrol  400 mg Oral Daily   potassium & sodium phosphates  1 packet Oral TID WC & HS   acetaminophen  **OR** acetaminophen , HYDROcodone-acetaminophen , ondansetron  **OR** ondansetron   (ZOFRAN ) IV  Assessment/ Plan:  [name] is a 83 y.o.  female  with past medical history including CKD 4 diabetes, hypertension, proteinuria, and hypercalcemia. Patient presents to the ED with altered mental status from SNF and has been admitted for Hypercalcemia [E83.52]     Severe hypercalcemia, serum calcium  14.2 on ED arrival with phosphorus 1.0. Prescribed Cinacalcet continued at 60mg  daily. S Calcium  corrected to 9.5  2. Hypernatremia  Sodium on arrival 150 but minimum improvement with IV hydration. Sodium corrected  3. Hypophosphatemia/Hypokalemia  Corrected with supplementation by primary team.  4. Diabetes mellitus type 2 with chronic kidney disease and chronic kidney disease 4.  noninsulin dependent.  Most recent hemoglobin A1c is 5.9 on 05/19/24.  Most recent creatinine prior to admission 2.52 with GFR 19 on 9/11.SABRAMRI ordered to follow-up the prior renal mass noted back in 2023.  Primary team to continue management.  Renal function has improved beyond stated baseline.   5. Hypertension with chronic kidney disease   Home regimen includes carvedilol , amlodipine , and torsemide . All remain held.  Due to renal recovery and stabilize calcium , we will sign off at this time.  We will continue to follow patient as outpatient.   LOS: 6 Shelby [name] 10/15/202511:25 AM

## 2024-09-21 NOTE — Plan of Care (Signed)

## 2024-09-22 LAB — BASIC METABOLIC PANEL WITH GFR
Anion gap: 8 (ref 5–15)
BUN: 16 mg/dL (ref 8–23)
CO2: 23 mmol/L (ref 22–32)
Calcium: 9.9 mg/dL (ref 8.9–10.3)
Chloride: 106 mmol/L (ref 98–111)
Creatinine, Ser: 1.18 mg/dL — ABNORMAL HIGH (ref 0.44–1.00)
GFR, Estimated: 46 mL/min — ABNORMAL LOW (ref >60)
Glucose, Bld: 67 mg/dL — ABNORMAL LOW (ref 70–99)
Potassium: 4.4 mmol/L (ref 3.5–5.1)
Sodium: 137 mmol/L (ref 135–145)

## 2024-09-22 LAB — MAGNESIUM: Magnesium: 1.7 mg/dL (ref 1.7–2.4)

## 2024-09-22 LAB — CBC
HCT: 40.1 % (ref 36.0–46.0)
Hemoglobin: 13.1 g/dL (ref 12.0–15.0)
MCH: 30.8 pg (ref 26.0–34.0)
MCHC: 32.7 g/dL (ref 30.0–36.0)
MCV: 94.4 fL (ref 80.0–100.0)
Platelets: 133 K/uL — ABNORMAL LOW (ref 150–400)
RBC: 4.25 MIL/uL (ref 3.87–5.11)
RDW: 15.8 % — ABNORMAL HIGH (ref 11.5–15.5)
WBC: 6.6 K/uL (ref 4.0–10.5)
nRBC: 0 % (ref 0.0–0.2)

## 2024-09-22 LAB — GLUCOSE, CAPILLARY
Glucose-Capillary: 70 mg/dL (ref 70–99)
Glucose-Capillary: 77 mg/dL (ref 70–99)
Glucose-Capillary: 84 mg/dL (ref 70–99)
Glucose-Capillary: 75 mg/dL (ref 70–99)

## 2024-09-22 LAB — PHOSPHORUS: Phosphorus: 1.9 mg/dL — ABNORMAL LOW (ref 2.5–4.6)

## 2024-09-22 MED ORDER — SODIUM PHOSPHATES 45 MMOLE/15ML IV SOLN
30.0000 mmol | Freq: Once | INTRAVENOUS | Status: AC
  Administered 2024-09-22: 30 mmol via INTRAVENOUS
  Filled 2024-09-22: qty 10

## 2024-09-22 NOTE — Plan of Care (Signed)
  Problem: Education: Goal: Ability to describe self-care measures that may prevent or decrease complications (Diabetes Survival Skills Education) will improve Outcome: Progressing   Problem: Coping: Goal: Ability to adjust to condition or change in health will improve Outcome: Progressing   Problem: Health Behavior/Discharge Planning: Goal: Ability to identify and utilize available resources and services will improve Outcome: Progressing Goal: Ability to manage health-related needs will improve Outcome: Progressing   Problem: Tissue Perfusion: Goal: Adequacy of tissue perfusion will improve Outcome: Progressing   Problem: Clinical Measurements: Goal: Will remain free from infection Outcome: Progressing

## 2024-09-22 NOTE — Plan of Care (Signed)

## 2024-09-22 NOTE — TOC CM/SW Note (Signed)
 The pt has a medical condition which requires positioning of body in ways not feasible with an ordinary bed in order to alleviate pain    The pt requires frequent repositioning of the body and the head to be at 30 degrees due to (Acute metabolic encephalopathy ). This is not attainable by an ordinary bed they will need a hospital bed .    The patient will also require a hoyer lift . Without the [facility] lift , the patient will be confined to the bed .

## 2024-09-22 NOTE — TOC Progression Note (Signed)
   Transition of Care [facility]) - Progression Note    Patient Details  Name: Shelby [name] MRN: [id number] Date of Birth: 04/15/1941  Transition of Care [facility]) CM/SW Contact  Marinda Cooks, RN Phone Number: 09/22/2024, 3:35 PM  Clinical Narrative:    This CM received a message from Tammy at [facility] pt  only has 7 STR days left for her rehab stay ,  & pt's secondary payor is [place], but they will not cover the days. Pt will need to pay upfront $203 dollars a day for 7 days for a total of $1421. spoke with pt's  son Shelby [name] and updated of this info   he informed pt can not  afford that amount to go to rehab. I shared HH is the next best  dc option to  get rehab for pt at which time he agreed with that plan  and informed he did not have a preference of agency referrals sent . This CM sent [facility] referrals & confirmed with [facility] for PT/OT admission liasion  informed they could not staff a HH aide due to pt's insurance DME referral sent to [facility] made aware to co sign note . TOC will cont to follow dc planning /care coordination and update as applicable.    Expected Discharge Plan and Services  [facility] & DME         Social Drivers of Health (SDOH) Interventions SDOH Screenings   Food Insecurity: No Food Insecurity (08/18/2024)  Housing: Unknown (08/18/2024)  Transportation Needs: No Transportation Needs (08/18/2024)  Utilities: Not At Risk (08/18/2024)  Alcohol Screen: Low Risk  (12/20/2018)  Financial Resource Strain: Low Risk  (03/01/2024)   Received from [facility]  Physical Activity: Inactive (03/19/2021)   Received from [facility]  Social Connections: Socially Isolated (08/18/2024)  Stress: No Stress Concern Present (03/19/2021)   Received from [facility]  Tobacco Use: Low Risk  (09/15/2024)    Readmission Risk Interventions     No data to display

## 2024-09-22 NOTE — Progress Notes (Signed)
 [facility] Progress Note  Patient: Shelby [name]    [name]  DOA: 09/15/2024     Date of Service: the patient was seen and examined on 09/22/2024  Chief Complaint  Patient presents with   Altered Mental Status   Weakness   Brief hospital course: Shelby [name] is a 83 y.o. female with medical history significant of Hyperparathyroidism, dementia, heart failure with reduced ejection fraction, anemia of chronic disease, dilated cardiomyopathy, type 2 diabetes, essential hypertension, hyperlipidemia, obstructive sleep apnea, peripheral neuropathy, chronic kidney disease stage III, history of seizure-like activity and urinary incontinence who was brought in from [facility] by family wanting her to be checked.  Patient was recently diagnosed with UTI pneumonia and CHF exacerbation.  She has been taking medications at the facility.  Family visited and noticed she is more confused.  Patient is currently unable to give history.  She also altered.  She responds to voice by opening her eyes.  Workup performed showed significant change from last month.  Her sodium is 150, calcium  14.2 and phosphorus only 1.  Patient also has known history of chronic kidney disease creatinine is 1.87.  Head CT without contrast showed no acute findings.  Nephrology consulted and recommends admission for treatment of hypercalcemia and hyponatremia.    Assessment and Plan:  # Acute metabolic encephalopathy: Most likely multifactorial.  Hypercalcemia and hypernatremia. UA negative  Continue supportive care, delirium precautions Fall precautions   # Hypercalcemia: Due to known history of hyperparathyroidism Continue to monitor on telemetry Continue Sensipar, patient was on it due to known history of hyperparathyroidism Continue IVF 1/2 NS for hydration Nephrology consulted, pamidronate order placed Monitor BMP daily Ca 12.4 >11.0>10.2>10.0>9.9   # Hypernatremia: Resolved Most  likely due to dehydration.  Continue D5 half NS IV hydration Na 137 improving  # Hypomagnesemia, mag repleted. # Hypokalemia, potassium repleted.  Resolved # Hypophosphatemia secondary to nutritional deficiency. Phos repleted. Check electrolytes daily  # Type 2 diabetes: Initiate sliding scale insulin . Hypoglycemia due to decreased oral intake. Follow hypoglycemia protocol Encourage oral intake   # Essential hypertension: Off medications at this time Continue to monitor BP and titrate medications accordingly    # Acute on chronic kidney disease stage IIIb: Renal function appears to be at baseline.  Continue to monitor Bladder scan negative for urinary retention US  Renal: Multiple bilateral renal lesions, as described above. No hydronephrosis.    # Hyperlipidemia: Will resume statin when stable.    # Dilated cardiomyopathy: Appears compensated.  Hold off medications for now, resume when patient is stable.   # Anorexia, poor appetite and frequent episodes of hypoglycemia Follow hypoglycemia protocol Encourage oral diet, continue ensures Started Megace to stimulate appetite Assist with feeding  # Pyuria, urine culture growing multiple species. Empirically started Augmentin twice daily for 3 days.   # Dementia: Currently obtunded. # obstructive sleep apnea: Consider CPAP at night.  # Obesity class II Body mass index is 36.25 kg/m.  Interventions: Calorie restricted diet and daily exercise will be advised to lose body weight.  Lifestyle modification needs to be discussed with patient has severe dementia    Wound 08/18/24 1821 Pressure Injury Buttocks Mid Stage 2 -  Partial thickness loss of dermis presenting as a shallow open injury with a red, pink wound bed without slough. (Active)     Diet: Dysphagia 3 diet DVT Prophylaxis: Subcutaneous Heparin     Advance goals of care discussion: Full code  Family Communication: family was present at bedside,  at the time of  interview.  The pt provided permission to discuss medical plan with the family. Opportunity was given to ask question and all questions were answered satisfactorily.   Disposition:  Pt is from SNF, admitted with AME due to hypercalcemia, and noticed hypernatremia, which improved with IV hydration.  Currently patient is stable to discharge. Discharge to SNF, when bed will be available.   10/15 as per TOC patient can go to [facility] a.m. 10/16 as per [facility] patient has 7 days left for SNF and requires upfront payment which patient's son said he cannot afford so plan is to send her home with home health.  Hospital bed and [facility] lift order placed. Follow TOC for dispo plan DC home tomorrow if electrolytes stable and no hypoglycemia.    Subjective: No significant event overnight, patient is awake and alert.  Patient was resting comfortably, denied any complaints.  Patient still has very poor appetite Patient's son was at bedside, management plan discussed.   Physical Exam: General: NAD, lying comfortably Appear in no distress, affect appropriate Eyes: PERRLA ENT: Oral Mucosa Clear, moist  Neck: no JVD,  Cardiovascular: S1 and S2 Present, no Murmur,  Respiratory: good respiratory effort, Bilateral Air entry equal and Decreased, no Crackles, no wheezes Abdomen: Bowel Sound present, Soft and no tenderness,  Skin: no rashes Extremities: no Pedal edema, no calf tenderness Neurologic: without any new focal findings Gait not checked due to patient safety concerns  Vitals:   09/21/24 1928 09/22/24 0444 09/22/24 0749 09/22/24 1631  BP: 133/74 121/67 (!) 118/57 113/61  Pulse: 78 80 75 78  Resp: 18 16 18 16   Temp: 98.2 F (36.8 C) 98.6 F (37 C) 99.4 F (37.4 C) 99 F (37.2 C)  TempSrc:  Oral Oral Oral  SpO2: 99% 100% 98% 100%  Weight:      Height:        Intake/Output Summary (Last 24 hours) at 09/22/2024 1655 Last data filed at 09/22/2024 1428 Gross per 24 hour   Intake 0 ml  Output 700 ml  Net -700 ml   Filed Weights   09/16/24 0800  Weight: 84.2 kg    Data Reviewed: I have personally reviewed and interpreted daily labs, tele strips, imagings as discussed above. I reviewed all nursing notes, pharmacy notes, vitals, pertinent old records I have discussed plan of care as described above with RN and patient/family.  CBC: Recent Labs  Lab 09/18/24 0540 09/19/24 0541 09/20/24 0546 09/21/24 0519 09/22/24 0536  WBC 5.5 6.4 6.7 6.7 6.6  HGB 11.3* 12.3 12.0 12.3 13.1  HCT 35.9* 37.7 36.6 37.8 40.1  MCV 96.5 94.7 94.1 93.8 94.4  PLT 141* 133* 171 178 133*   Basic Metabolic Panel: Recent Labs  Lab 09/18/24 0540 09/18/24 1652 09/19/24 0541 09/20/24 0546 09/21/24 0519 09/22/24 0536  NA 148* 147* 144 143 138 137  K 2.8* 3.9 4.1 4.2 4.1 4.4  CL 106 106 107 108 107 106  CO2 34* 29 30 27 24 23   GLUCOSE 122* 147* 102* 83 102* 67*  BUN 31* 30* 26* 21 19 16   CREATININE 1.36* 1.43* 1.29* 1.06* 0.98 1.18*  CALCIUM  11.0* 11.0* 10.2 10.0 9.5 9.9  MG 1.8  --  1.6* 2.1 2.0 1.7  PHOS 2.1* 1.9* 2.3* 2.7 2.0* 1.9*    Studies: No results found.   Scheduled Meds:  amoxicillin-clavulanate  1 tablet Oral Q12H   feeding supplement  237 mL Oral TID BM   heparin   5,000 Units Subcutaneous Q8H   insulin  aspart  0-15 Units Subcutaneous TID WC   insulin  aspart  0-5 Units Subcutaneous QHS   megestrol  400 mg Oral Daily   Continuous Infusions:  sodium PHOSPHATE IVPB (in mmol) 30 mmol (09/22/24 1105)    PRN Meds: acetaminophen  **OR** acetaminophen , HYDROcodone-acetaminophen , ondansetron  **OR** ondansetron  (ZOFRAN ) IV  Time spent: 40 minutes  Author: ELVAN SOR. MD [facility] 09/22/2024 4:55 PM  To reach On-call, see care teams to locate the attending and reach out to them via [email or web address]. If 7PM-7AM, please contact night-coverage If you still have difficulty reaching the attending provider, please page the [facility] (Director on Call)  for [facility] on amion for assistance.

## 2024-09-22 NOTE — TOC Progression Note (Signed)
 Transition of Care [facility]) - Progression Note    Patient Details  Name: Shelby [name] MRN: [id number] Date of Birth: 17-Jul-1941  Transition of Care [facility]) CM/SW Contact  [name] Fuse, RN Phone Number: 09/22/2024, 11:30 AM  Clinical Narrative:     TOC received a message from Tammy at [facility]. She only has 7 STR days left. Her secondary payor is [place], but they will not cover the days. She will need to pay upfront $203 dollars a day for 7 days for a total of $1421. TOC forwarded the message to the covering TOC.                    Expected Discharge Plan and Services                                               Social Drivers of Health (SDOH) Interventions SDOH Screenings   Food Insecurity: No Food Insecurity (08/18/2024)  Housing: Unknown (08/18/2024)  Transportation Needs: No Transportation Needs (08/18/2024)  Utilities: Not At Risk (08/18/2024)  Alcohol Screen: Low Risk  (12/20/2018)  Financial Resource Strain: Low Risk  (03/01/2024)   Received from [facility]  Physical Activity: Inactive (03/19/2021)   Received from [facility]  Social Connections: Socially Isolated (08/18/2024)  Stress: No Stress Concern Present (03/19/2021)   Received from [facility]  Tobacco Use: Low Risk  (09/15/2024)    Readmission Risk Interventions     No data to display

## 2024-09-23 DIAGNOSIS — G9341 Metabolic encephalopathy: Secondary | ICD-10-CM | POA: Diagnosis not present

## 2024-09-23 DIAGNOSIS — Z66 Do not resuscitate: Secondary | ICD-10-CM | POA: Diagnosis not present

## 2024-09-23 DIAGNOSIS — Z515 Encounter for palliative care: Secondary | ICD-10-CM | POA: Diagnosis not present

## 2024-09-23 LAB — CBC
HCT: 34 % — ABNORMAL LOW (ref 36.0–46.0)
Hemoglobin: 11.3 g/dL — ABNORMAL LOW (ref 12.0–15.0)
MCH: 30.7 pg (ref 26.0–34.0)
MCHC: 33.2 g/dL (ref 30.0–36.0)
MCV: 92.4 fL (ref 80.0–100.0)
Platelets: 196 K/uL (ref 150–400)
RBC: 3.68 MIL/uL — ABNORMAL LOW (ref 3.87–5.11)
RDW: 15.8 % — ABNORMAL HIGH (ref 11.5–15.5)
WBC: 6 K/uL (ref 4.0–10.5)
nRBC: 0 % (ref 0.0–0.2)

## 2024-09-23 LAB — BASIC METABOLIC PANEL WITH GFR
Anion gap: 6 (ref 5–15)
BUN: 15 mg/dL (ref 8–23)
CO2: 23 mmol/L (ref 22–32)
Calcium: 9.1 mg/dL (ref 8.9–10.3)
Chloride: 109 mmol/L (ref 98–111)
Creatinine, Ser: 1.12 mg/dL — ABNORMAL HIGH (ref 0.44–1.00)
GFR, Estimated: 49 mL/min — ABNORMAL LOW (ref >60)
Glucose, Bld: 70 mg/dL (ref 70–99)
Potassium: 3.7 mmol/L (ref 3.5–5.1)
Sodium: 138 mmol/L (ref 135–145)

## 2024-09-23 LAB — GLUCOSE, CAPILLARY
Glucose-Capillary: 56 mg/dL — ABNORMAL LOW (ref 70–99)
Glucose-Capillary: 130 mg/dL — ABNORMAL HIGH (ref 70–99)
Glucose-Capillary: 71 mg/dL (ref 70–99)
Glucose-Capillary: 92 mg/dL (ref 70–99)
Glucose-Capillary: 96 mg/dL (ref 70–99)

## 2024-09-23 LAB — MAGNESIUM: Magnesium: 1.8 mg/dL (ref 1.7–2.4)

## 2024-09-23 LAB — PHOSPHORUS: Phosphorus: 2.5 mg/dL (ref 2.5–4.6)

## 2024-09-23 MED ORDER — COLLAGENASE 250 UNIT/GM EX OINT
TOPICAL_OINTMENT | Freq: Every day | CUTANEOUS | Status: DC
  Filled 2024-09-23: qty 30

## 2024-09-23 NOTE — Plan of Care (Signed)
  Problem: Education: Goal: Ability to describe self-care measures that may prevent or decrease complications (Diabetes Survival Skills Education) will improve Outcome: Progressing   Problem: Coping: Goal: Ability to adjust to condition or change in health will improve Outcome: Progressing   Problem: Fluid Volume: Goal: Ability to maintain a balanced intake and output will improve Outcome: Progressing   Problem: Health Behavior/Discharge Planning: Goal: Ability to identify and utilize available resources and services will improve Outcome: Progressing   Problem: Metabolic: Goal: Ability to maintain appropriate glucose levels will improve Outcome: Progressing   Problem: Skin Integrity: Goal: Risk for impaired skin integrity will decrease Outcome: Progressing

## 2024-09-23 NOTE — Inpatient Diabetes Management (Signed)
 Inpatient Diabetes Program Recommendations  AACE/ADA: New Consensus Statement on Inpatient Glycemic Control (2015)  Target Ranges:  Prepandial:   less than 140 mg/dL      Peak postprandial:   less than 180 mg/dL (1-2 hours)      Critically ill patients:  140 - 180 mg/dL    Latest Reference Range & Units 09/21/24 08:09 09/21/24 12:02 09/21/24 16:06 09/21/24 17:16 09/21/24 21:59  Glucose-Capillary 70 - 99 mg/dL 87 87 68 (L) 879 (H) 80    Latest Reference Range & Units 09/22/24 07:51 09/22/24 11:34 09/22/24 16:33 09/22/24 20:25  Glucose-Capillary 70 - 99 mg/dL 70 77 84 75    Latest Reference Range & Units 09/23/24 07:32  Glucose-Capillary 70 - 99 mg/dL 56 (L)  (L): Data is abnormally low    Current Orders: Novolog  Moderate Correction Scale/ SSI (0-15 units) TID AC + HS     MD- Note Hypoglycemic events No Novolog  has been given since 10/13  Please consider stopping Novolog  SSI for now but continue to check CBGs TID AC + HS    --Will follow patient during hospitalization--  Adina Rudolpho Arrow RN, MSN, CDCES Diabetes Coordinator Inpatient Glycemic Control Team Team Pager: [phone] (8a-5p)

## 2024-09-23 NOTE — Progress Notes (Signed)
 Palliative Care Progress Note, Assessment & Plan   Patient Name: Shelby [name]       Date: 09/23/2024 DOB: Jul 07, 1941  Age: 83 y.o. MRN#: [id number] Attending Physician: Dorinda Drue DASEN, MD Primary Care Physician: Steva Clotilda DEL, NP Admit Date: 09/15/2024  Subjective: Feels good today. Reports she drank milkshake and ate some of her meals. Denies pain. States son visited earlier. Sleeping well.   HPI: 83 y.o. female  with past medical history significant for dementia, HFrEF, anemia of chronic disease, dilated cardiomyopathy, hyperparathyroidism, DM II, essential hypertension, HLD, OSA, peripheral neuropathy, CKD stage III, history of seizure-like activity and urinary incontinence. Patient presented to ED 09/15/2024 c/o AMS and weakness. Patient family reported in ED that patient was recently treated for UTI, PNA and CHF exacerbation. They requested evaluation due to being more confused than normal.    ED labs significant for sodium 150, CO2 36, glucose 121, BUN 44, creatinine 1.87, calcium  14.2, albumin  3.4 and GFR 27. Phosphorus 1.0. UA negative.    Head CT showed no acute intracranial abnormality with stable atrophy and chronic small vessel disease.    ED vitals 111/88, HR 85, SpO2 92% RA, RR 18, 98.3 F   TRH was consulted for admission and management of acute metabolic encephalopathy, hypercalcemia, hypernatremia and acute on chronic CKD stage IIIb.   Palliative care was consulted for assistance with goals of care conversations.   Summary of counseling/coordination of care: Extensive chart review completed prior to meeting patient including labs, vital signs, imaging, progress notes, orders, and available advanced directive documents from current and previous encounters.      Latest Ref  Rng & Units 09/23/2024    5:39 AM 09/22/2024    5:36 AM 09/21/2024    5:19 AM  CBC  WBC 4.0 - 10.5 K/uL 6.0  6.6  6.7   Hemoglobin 12.0 - 15.0 g/dL 88.6  86.8  87.6   Hematocrit 36.0 - 46.0 % 34.0  40.1  37.8   Platelets 150 - 400 K/uL 196  133  178       Latest Ref Rng & Units 09/23/2024    5:39 AM 09/22/2024    5:36 AM 09/21/2024    5:19 AM  CMP  Glucose 70 - 99 mg/dL 70  67  897   BUN 8 - 23 mg/dL 15  16  19    Creatinine 0.44 - 1.00 mg/dL 8.87  8.81  9.01   Sodium 135 - 145 mmol/L 138  137  138   Potassium 3.5 - 5.1 mmol/L 3.7  4.4  4.1   Chloride 98 - 111 mmol/L 109  106  107   CO2 22 - 32 mmol/L 23  23  24    Calcium  8.9 - 10.3 mg/dL 9.1  9.9  9.5     After reviewing the patient's chart I assessed patient at bedside and spoke with Vinie, son, via phone.   Elderly, pleasantly confused lying in bed watching TV. She is alert and oriented to self, DOB and location. Unable to state correct year or president. Not a reliable historian. She is more alert and interactive today than in previous visits.  Respirations are even and unlabored. She is in  no distress.   Patient son called requesting palliative assistance with Medicaid application for financial assistance to place his mother in a facility. Advised that palliative does not file application but would forward his request to case manager.   Messaged [place], TOC, to contact son with information on Medicaid and options for care. She contacted Vinie and explained Medicaid assistance process as gave him resources for home health as well.    Physical Exam Vitals reviewed.  Constitutional:      General: She is not in acute distress.    Appearance: She is not ill-appearing.  HENT:     Head: Normocephalic and atraumatic.     Mouth/Throat:     Mouth: Mucous membranes are moist.     Comments: Missing teeth Pulmonary:     Effort: Pulmonary effort is normal. No respiratory distress.  Abdominal:     General: There is no  distension.     Palpations: Abdomen is soft.     Tenderness: There is no abdominal tenderness. There is no guarding.  Musculoskeletal:     Right lower leg: No edema.     Left lower leg: No edema.  Skin:    General: Skin is warm and dry.  Neurological:     Mental Status: She is alert. She is disoriented.  Psychiatric:        Mood and Affect: Mood normal.        Behavior: Behavior normal.     Recommendations/Plan: DNR with intervention    Continue current supportive interventions Follow TOC for disposition  PMT will follow peripherally and remain available for needs            Total Time 50 minutes   Time spent includes: Detailed review of medical records (labs, imaging, vital signs), medically appropriate exam (mental status, respiratory, cardiac, skin), discussed with treatment team, counseling and educating patient, family and staff, documenting clinical information, medication management and coordination of care.     Devere Sacks, ELNITA- [facility] Palliative Medicine Team  09/23/2024 4:03 PM  Office [phone]  Pager [phone]

## 2024-09-23 NOTE — TOC Progression Note (Signed)
 Transition of Care [facility]) - Progression Note    Patient Details  Name: Shelby [name] MRN: [id number] Date of Birth: 1941-04-22  Transition of Care [facility]) CM/SW Contact  [name] Fuse, RN Phone Number: 09/23/2024, 1:29 PM  Clinical Narrative:     TOC spoke with the patient's son and explained the [facility] LTSS application process. The son was under the impression the resources would be available instantly. I explained that it could take anywhere from 30 to 60 days to process the application. He declined to start the process at this time. TOC shared contact info for [facility] and encouraged him to reach out to the agency to see if they may be able to provide additional support. TOC also mentioned that early on the patient's niece was more than willing to provide assistance with the patient's care. TOC encouraged the patient's son to speak with family and friends about a schedule for caring for the patient in the home.                    Expected Discharge Plan and Services                                               Social Drivers of Health (SDOH) Interventions SDOH Screenings   Food Insecurity: No Food Insecurity (08/18/2024)  Housing: Unknown (08/18/2024)  Transportation Needs: No Transportation Needs (08/18/2024)  Utilities: Not At Risk (08/18/2024)  Alcohol Screen: Low Risk  (12/20/2018)  Financial Resource Strain: Low Risk  (03/01/2024)   Received from [facility]  Physical Activity: Inactive (03/19/2021)   Received from [facility]  Social Connections: Socially Isolated (08/18/2024)  Stress: No Stress Concern Present (03/19/2021)   Received from [facility]  Tobacco Use: Low Risk  (09/15/2024)    Readmission Risk Interventions     No data to display

## 2024-09-23 NOTE — Consult Note (Addendum)
 WOC Nurse Consult Note: Reason for Consult: Consult requested for bilat buttocks and sacrum.   Pt was noted to have a Stage 2 pressure injury to the sacrum which was present on admission, but has now declined to Unstageable, which is a PSI-3 event, according to medical coding guidelines. 2X1cm, 100% tightly adhered yellow slough, small amt tan drainage.  Bilat buttocks have scattered areas of pink dry scar tissue from previous wounds which have healed, and red moist full thickness loss in patchy areas; appearance and locations are consistent with full thickness wounds related to moisture associated skin damage, NOT pressure injuries.  Left and right buttocks each have wounds approx 3X2X.1cm, red and moist  with intact bridges of skin in between. Total affected area is approx 8X5cm; small amt pink blood-tinged drainage when cleansed.  It is difficult to promote healing when the patient is frequently incontinent.  Dressing procedure/placement/frequency: Topical treatment orders provided for bedside nurses to perform as follows to assist with removal of nonviable tissue: Apply Santyl to sacrum wound Q day, then cover with moist 2X2.  Apply foam dressing to sacrum/bilat buttocks and change Q 3 days or PRN soiling.  WOC team will reassess Q 7-10 days to determine if a change in the plan of care is indicated at that time.   Thank-you,  Stephane Fought MSN, RN, CWOCN, CWCN-AP, CNS Contact Mon-Fri 0700-1500: [phone]

## 2024-09-23 NOTE — Plan of Care (Signed)

## 2024-09-23 NOTE — Progress Notes (Signed)
 56 mg 4 OZ given  orally. Recheck at 15 minute was 130 mg/dl

## 2024-09-23 NOTE — Progress Notes (Signed)
 Progress Note   Patient: Cameren Odwyer [name] DOB: 25-Jul-1941 DOA: 09/15/2024     8 DOS: the patient was seen and examined on 09/23/2024   Brief hospital course:  Hannahmarie Asberry is a 83 y.o. female with medical history significant of Hyperparathyroidism, dementia, heart failure with reduced ejection fraction, anemia of chronic disease, dilated cardiomyopathy, type 2 diabetes, essential hypertension, hyperlipidemia, obstructive sleep apnea, peripheral neuropathy, chronic kidney disease stage III, history of seizure-like activity and urinary incontinence who was brought in from [facility] by family wanting her to be checked.  Patient was recently diagnosed with UTI pneumonia and CHF exacerbation.  She has been taking medications at the facility.  Family visited and noticed she is more confused.  Patient is currently unable to give history.  She also altered.  She responds to voice by opening her eyes.  Workup performed showed significant change from last month.  Her sodium is 150, calcium  14.2 and phosphorus only 1.  Patient also has known history of chronic kidney disease creatinine is 1.87.  Head CT without contrast showed no acute findings.  Nephrology consulted and recommends admission for treatment of hypercalcemia and hyponatremia.      Assessment and Plan:   # Acute metabolic encephalopathy: Most likely multifactorial.  Hypercalcemia and hypernatremia. UA negative  Continue supportive care, delirium precautions Fall precautions Monitor neuro function   # Hypercalcemia: Due to known history of hyperparathyroidism Continue to monitor on telemetry Continue Sensipar, patient was on it due to known history of hyperparathyroidism Patient received IV fluid  Nephrology consulted, pamidronate order placed Monitor calcium  level   # Hypernatremia: Resolved Continue to monitor   # Hypomagnesemia, mag repleted. # Hypokalemia, potassium repleted.  Resolved #  Hypophosphatemia secondary to nutritional deficiency. Phos repleted. Check electrolytes daily   # Type 2 diabetes:  Hypoglycemia due to decreased oral intake. Follow hypoglycemia protocol Continue insulin  therapy   # Essential hypertension: Off medications at this time Continue to monitor BP and titrate medications accordingly     # Acute on chronic kidney disease stage IIIb: Renal function appears to be at baseline.  Continue to monitor Bladder scan negative for urinary retention US  Renal: Multiple bilateral renal lesions, as described above. No hydronephrosis.     # Hyperlipidemia: Will resume statin when stable.    # Dilated cardiomyopathy: Appears compensated.  Hold off medications for now, resume when patient is stable.   # Anorexia, poor appetite and frequent episodes of hypoglycemia Follow hypoglycemia protocol Encourage oral diet, continue ensures Continue Megace to stimulate appetite Continue assistance with feeding   # Pyuria, urine culture growing multiple species. Empirically started Augmentin twice daily for 3 days.     # Dementia: Currently obtunded. # obstructive sleep apnea: Consider CPAP at night.  # Obesity class II Body mass index is 36.25 kg/m.    Diet: Dysphagia 3 diet DVT Prophylaxis: Subcutaneous Heparin      Advance goals of care discussion: Full code   Family Communication: family was present at bedside, at the time of interview.   Disposition:  Pt is from SNF, admitted with AME due to hypercalcemia, and noticed hypernatremia, which improved with IV hydration.  Currently patient is stable to discharge. Discharge to SNF, when bed will be available.   10/15 as per TOC patient can go to SN[facility] tomorrow a.m. 10/16 as per [facility] patient has 7 days left for SNF and requires upfront payment which patient's son said he cannot afford so plan is to  send her home with home health.  Hospital bed and [facility] lift order placed. Follow TOC for dispo  plan   Subjective:  Patient seen and examined at bedside this morning Still having poor oral intake Patient's son looking forward to patient having improved oral intake before discharge to facility She denied nausea vomiting abdominal pain no chest pain   Physical Exam: General: NAD, lying comfortably Appear in no distress, affect appropriate Eyes: PERRLA ENT: Oral Mucosa Clear, moist  Neck: no JVD,  Cardiovascular: S1 and S2 Present, no Murmur,  Respiratory: good respiratory effort, Bilateral Air entry equal and Decreased, no Crackles, no wheezes Abdomen: Bowel Sound present, Soft and no tenderness,  Skin: no rashes Extremities: no Pedal edema, no calf tenderness Neurologic: without any new focal findings Gait not checked due to patient safety concerns   Data Reviewed:    Latest Ref Rng & Units 09/23/2024    5:39 AM 09/22/2024    5:36 AM 09/21/2024    5:19 AM  CBC  WBC 4.0 - 10.5 K/uL 6.0  6.6  6.7   Hemoglobin 12.0 - 15.0 g/dL 88.6  86.8  87.6   Hematocrit 36.0 - 46.0 % 34.0  40.1  37.8   Platelets 150 - 400 K/uL 196  133  178        Latest Ref Rng & Units 09/23/2024    5:39 AM 09/22/2024    5:36 AM 09/21/2024    5:19 AM  BMP  Glucose 70 - 99 mg/dL 70  67  897   BUN 8 - 23 mg/dL 15  16  19    Creatinine 0.44 - 1.00 mg/dL 8.87  8.81  9.01   Sodium 135 - 145 mmol/L 138  137  138   Potassium 3.5 - 5.1 mmol/L 3.7  4.4  4.1   Chloride 98 - 111 mmol/L 109  106  107   CO2 22 - 32 mmol/L 23  23  24    Calcium  8.9 - 10.3 mg/dL 9.1  9.9  9.5     Vitals:   09/22/24 1631 09/22/24 2023 09/23/24 0501 09/23/24 0728  BP: 113/61 121/83 121/65 119/70  Pulse: 78 84 73 72  Resp: 16 18 18 16   Temp: 99 F (37.2 C) 98 F (36.7 C) 98.2 F (36.8 C) 98.4 F (36.9 C)  TempSrc: Oral   Oral  SpO2: 100% 99% 100% 100%  Weight:      Height:         Author: Drue ONEIDA Potter, MD 09/23/2024 3:24 PM  For on call review [email or web address].

## 2024-09-24 LAB — BASIC METABOLIC PANEL WITH GFR
Anion gap: 10 (ref 5–15)
BUN: 14 mg/dL (ref 8–23)
CO2: 23 mmol/L (ref 22–32)
Calcium: 9.5 mg/dL (ref 8.9–10.3)
Chloride: 107 mmol/L (ref 98–111)
Creatinine, Ser: 0.89 mg/dL (ref 0.44–1.00)
GFR, Estimated: >60 mL/min (ref >60)
Glucose, Bld: 79 mg/dL (ref 70–99)
Potassium: 3.7 mmol/L (ref 3.5–5.1)
Sodium: 140 mmol/L (ref 135–145)

## 2024-09-24 LAB — CBC
HCT: 35.2 % — ABNORMAL LOW (ref 36.0–46.0)
Hemoglobin: 11.4 g/dL — ABNORMAL LOW (ref 12.0–15.0)
MCH: 30 pg (ref 26.0–34.0)
MCHC: 32.4 g/dL (ref 30.0–36.0)
MCV: 92.6 fL (ref 80.0–100.0)
Platelets: 210 K/uL (ref 150–400)
RBC: 3.8 MIL/uL — ABNORMAL LOW (ref 3.87–5.11)
RDW: 15.8 % — ABNORMAL HIGH (ref 11.5–15.5)
WBC: 6.3 K/uL (ref 4.0–10.5)
nRBC: 0 % (ref 0.0–0.2)

## 2024-09-24 LAB — GLUCOSE, CAPILLARY
Glucose-Capillary: 64 mg/dL — ABNORMAL LOW (ref 70–99)
Glucose-Capillary: 80 mg/dL (ref 70–99)
Glucose-Capillary: 114 mg/dL — ABNORMAL HIGH (ref 70–99)

## 2024-09-24 LAB — PHOSPHORUS: Phosphorus: 1.5 mg/dL — ABNORMAL LOW (ref 2.5–4.6)

## 2024-09-24 LAB — MAGNESIUM: Magnesium: 2 mg/dL (ref 1.7–2.4)

## 2024-09-24 MED ORDER — POTASSIUM PHOSPHATES 15 MMOLE/5ML IV SOLN
30.0000 mmol | Freq: Once | INTRAVENOUS | Status: AC
  Administered 2024-09-24: 30 mmol via INTRAVENOUS
  Filled 2024-09-24: qty 10

## 2024-09-24 NOTE — Progress Notes (Signed)
 Progress Note   Patient: Shelby [name] [name] DOB: [birth year] DOA: 09/15/2024     9 DOS: the patient was seen and examined on 09/24/2024    Brief hospital course:   Shelby [name] is a 83 y.o. female with medical history significant of Hyperparathyroidism, dementia, heart failure with reduced ejection fraction, anemia of chronic disease, dilated cardiomyopathy, type 2 diabetes, essential hypertension, hyperlipidemia, obstructive sleep apnea, peripheral neuropathy, chronic kidney disease stage III, history of seizure-like activity and urinary incontinence who was brought in from [facility] by family wanting her to be checked.  Patient was recently diagnosed with UTI pneumonia and CHF exacerbation.  She has been taking medications at the facility.  Family visited and noticed she is more confused.  Patient is currently unable to give history.  She also altered.  She responds to voice by opening her eyes.  Workup performed showed significant change from last month.  Her sodium is 150, calcium  14.2 and phosphorus only 1.  Patient also has known history of chronic kidney disease creatinine is 1.87.  Head CT without contrast showed no acute findings.  Nephrology consulted and recommends admission for treatment of hypercalcemia and hyponatremia.      Assessment and Plan:   # Acute metabolic encephalopathy: Most likely multifactorial.  Hypercalcemia and hypernatremia. UA negative  Continue supportive care, delirium precautions Fall precautions Monitor neuro function   # Hypercalcemia: Due to known history of hyperparathyroidism Continue to monitor on telemetry Continue Sensipar, patient was on it due to known history of hyperparathyroidism Patient received IV fluid  Nephrology consulted, pamidronate order placed Monitor calcium  level   # Hypernatremia: Resolved Continue to monitor   # Hypomagnesemia, mag repleted. # Hypokalemia, potassium repleted.  Resolved #  Hypophosphatemia secondary to nutritional deficiency. Phos repleted. Check electrolytes daily   # Type 2 diabetes:  Hypoglycemia due to decreased oral intake. Follow hypoglycemia protocol Continue insulin  therapy   # Essential hypertension: Off medications at this time Continue to monitor BP and titrate medications accordingly   Hypophosphatemia-continue repletion and monitoring  # Acute on chronic kidney disease stage IIIb: Renal function appears to be at baseline.  Continue to monitor Bladder scan negative for urinary retention US  Renal: Multiple bilateral renal lesions, as described above. No hydronephrosis.     # Hyperlipidemia: Will resume statin when stable.    # Dilated cardiomyopathy: Appears compensated.  Hold off medications for now, resume when patient is stable.   # Anorexia, poor appetite and frequent episodes of hypoglycemia Follow hypoglycemia protocol Encourage oral diet, continue ensures Continue Megace to stimulate appetite Continue assistance with feeding   # Pyuria, urine culture growing multiple species. Empirically started Augmentin twice daily for 3 days.     # Dementia: Currently obtunded. # obstructive sleep apnea: Consider CPAP at night.  # Obesity class II Body mass index is 36.25 kg/m.    Diet: Dysphagia 3 diet DVT Prophylaxis: Subcutaneous Heparin      Advance goals of care discussion: Full code   Family Communication: family was present at bedside, at the time of interview.    Disposition:  Pt is from SNF, admitted with AME due to hypercalcemia, and noticed hypernatremia, which improved with IV hydration.  Currently patient is stable to discharge. Discharge to SNF, when bed will be available.   10/15 as per TOC patient can go to SN[facility] tomorrow a[facility]m. 10/16 as per [facility] patient has 7 days left for SNF and requires upfront payment which patient's son said he  cannot afford so plan is to send her home with home health.  Hospital bed  and [facility]. Follow TOC for dispo plan    Subjective:  Patient seen and examined at bedside this morning Still having poor oral intake Patient's son looking forward to patient having improved oral intake before discharge to facility She denied nausea vomiting abdominal pain no chest pain According to patient's son hospital bed and other DME yet to be delivered at home before discharge   Physical Exam: General: NAD, lying comfortably Appear in no distress, affect appropriate Eyes: PERRLA ENT: Oral Mucosa Clear, moist  Neck: no JVD,  Cardiovascular: S1 and S2 Present, no Murmur,  Respiratory: good respiratory effort, Bilateral Air entry equal and Decreased, no Crackles, no wheezes Abdomen: Bowel Sound present, Soft and no tenderness,  Skin: no rashes Extremities: no Pedal edema, no calf tenderness Neurologic: without any new focal findings Gait not checked due to patient safety concerns   Data Reviewed:   Vitals:   09/23/24 1629 09/23/24 1956 09/24/24 0451 09/24/24 0810  BP: 107/67 127/64 130/62 112/64  Pulse: 72 68 64 63  Resp: 14  16 18   Temp: 98.6 F (37 C) 98.2 F (36.8 C) 99.1 F (37.3 C) 98.3 F (36.8 C)  TempSrc:  Oral Oral Oral  SpO2: 100% 100% 98% 100%  Weight:      Height:          Latest Ref Rng & Units 09/24/2024    3:32 AM 09/23/2024    5:39 AM 09/22/2024    5:36 AM  BMP  Glucose 70 - 99 mg/dL 79  70  67   BUN 8 - 23 mg/dL 14  15  16    Creatinine 0.44 - 1.00 mg/dL 9.10  8.87  8.81   Sodium 135 - 145 mmol/L 140  138  137   Potassium 3.5 - 5.1 mmol/L 3.7  3.7  4.4   Chloride 98 - 111 mmol/L 107  109  106   CO2 22 - 32 mmol/L 23  23  23    Calcium  8.9 - 10.3 mg/dL 9.5  9.1  9.9        Latest Ref Rng & Units 09/24/2024    3:32 AM 09/23/2024    5:39 AM 09/22/2024    5:36 AM  CBC  WBC 4.0 - 10.5 K/uL 6.3  6.0  6.6   Hemoglobin 12.0 - 15.0 g/dL 88.5  88.6  86.8   Hematocrit 36.0 - 46.0 % 35.2  34.0  40.1   Platelets 150 - 400 K/uL  210  196  133      Author: Drue ONEIDA Potter, MD 09/24/2024 4:12 PM  For on call review [email or web address].

## 2024-09-24 NOTE — Progress Notes (Signed)
 Occupational Therapy Treatment Patient Details Name: Shelby [name] MRN: [id number] DOB: 07-24-1941 Today's Date: 09/24/2024   History of present illness Pt is an 83 y/o F admitted on 09/15/24 after being brought in by family with reports of more confusion, recently diagnosed with UTI, PNA, & CHF exacerbation. Pt is being treated for acute metabolic encephalopathy, hypercalcemia. Of note, pt with recent admission (08/2024) for L distal fibula fx, closed nondisplaced fx of R lateral malleolus. PMH: hyperparathyroidism, dementia, heart failure with reduced EF, anemia of chronic disease, dilated cardiomyopathy, DM2, HTN, HLD, OSA, peripheral neuropathy, CKD 3, seizure like activity, urinary incontinence   OT comments  Pt received semi-reclined in bed; on bedpan. Appearing alert; willing to work with OT on sitting EOB and trying lunch (although once we sat up, she was not interested in eating). T/f MOD A to EOB. Unable to attempt standing. See flowsheet below for further details of session. Left sidelying (with assist of NA) with all needs in reach; bed alarm on.  Patient will benefit from continued OT while in acute care.       If plan is discharge home, recommend the following:  Two people to help with walking and/or transfers;A lot of help with bathing/dressing/bathroom;Direct supervision/assist for medications management;Supervision due to cognitive status;Direct supervision/assist for financial management;Assist for transportation;Assistance with cooking/housework;Assistance with feeding;Help with stairs or ramp for entrance   Equipment Recommendations  Hospital bed;Hoyer lift    Recommendations for Other Services      Precautions / Restrictions Precautions Precautions: Fall Recall of Precautions/Restrictions: Impaired Precaution/Restrictions Comments: Per previous admission (08/2024), per podiatry note, recommend LLE boot for mobility. Restrictions RLE Weight Bearing Per Provider  Order: Non weight bearing Other Position/Activity Restrictions: Per podiatry note on 9/5: Advise use of a boot for weight-bearing activities such as transferring to a wheelchair or using the restroom.  - Instruct to rest and elevate the leg to aid in healing.  - Apply a compression wrap to the ankle to provide support and reduce swelling.       Mobility Bed Mobility Overal bed mobility: Needs Assistance Bed Mobility: Supine to Sit, Sit to Supine     Supine to sit: Mod assist, HOB elevated Sit to supine: Min assist   General bed mobility comments: required +2 to scoot up in bed    Transfers                   General transfer comment: Unable to t/f to standing today; pt unwilling to attempt.     Balance Overall balance assessment: Needs assistance Sitting-balance support: Feet supported Sitting balance-Leahy Scale: Fair                                     ADL either performed or assessed with clinical judgement   ADL Overall ADL's : Needs assistance/impaired Eating/Feeding: Set up;Sitting Eating/Feeding Details (indicate cue type and reason): Pt able to hold and drink Ensure from a straw.                                 Functional mobility during ADLs: Maximal assistance;+2 for physical assistance (attempted to have pt scoot up in bed while sitting on EOB, but pt just lay sidelying. Required +2 assist to scoot up in bed.) General ADL Comments: Pt stating I want to lay down after sitting edge of  bed for about 3 minutes; unable to be persuaded to engage in any other activities at this time.    Extremity/Trunk Assessment Upper Extremity Assessment Upper Extremity Assessment: Generalized weakness   Lower Extremity Assessment Lower Extremity Assessment: Generalized weakness LLE Deficits / Details: hx of L distal fib fx, minimal active movement of LLE        Vision   Additional Comments: unknown   Perception     Praxis      Communication Communication Communication: Impaired Factors Affecting Communication: Hearing impaired   Cognition Arousal: Alert Behavior During Therapy:  (unmotivated, but pleasant) Cognition: History of cognitive impairments, No family/caregiver present to determine baseline             OT - Cognition Comments: Pt refusing all food offers (lunch tray untouched) except for vanilla Ensure (drank a few sips). Follows one-step commands during session when she desires.                 Following commands: Impaired Following commands impaired: Follows one step commands inconsistently      Cueing   Cueing Techniques: Verbal cues, Tactile cues, Gestural cues  Exercises      Shoulder Instructions       General Comments Pt on room air; NA in room at end of session to assist with repositioning in bed. Pt left sidelying with pillow between knees; RN aware of OT having taken mepilex off sacral area due to soiled.    Pertinent Vitals/ Pain       Pain Assessment Pain Assessment: No/denies pain  Home Living                                          Prior Functioning/Environment              Frequency  Min 2X/week        Progress Toward Goals  OT Goals(current goals can now be found in the care plan section)  Progress towards OT goals: Progressing toward goals  Acute Rehab OT Goals Patient Stated Goal: None stated today OT Goal Formulation: With patient Time For Goal Achievement: 09/30/24 Potential to Achieve Goals: Fair ADL Goals Pt Will Perform Grooming: with min assist;sitting Pt Will Perform Lower Body Dressing: with min assist;sitting/lateral leans Pt Will Transfer to Toilet: stand pivot transfer;with mod assist;bedside commode Pt Will Perform Toileting - Clothing Manipulation and hygiene: sitting/lateral leans;with mod assist  Plan      Co-evaluation                 AM-PAC OT 6 Clicks Daily Activity     Outcome  Measure   Help from another person eating meals?: None Help from another person taking care of personal grooming?: A Little Help from another person toileting, which includes using toliet, bedpan, or urinal?: Total Help from another person bathing (including washing, rinsing, drying)?: A Lot Help from another person to put on and taking off regular upper body clothing?: A Little Help from another person to put on and taking off regular lower body clothing?: Total 6 Click Score: 14    End of Session    OT Visit Diagnosis: Other abnormalities of gait and mobility (R26.89);Muscle weakness (generalized) (M62.81)   Activity Tolerance Patient limited by fatigue;Other (comment) (limited by cognition/motivation)   Patient Left in bed;with call bell/phone within reach;with bed alarm set   Nurse Communication Mobility  status        Time: 8572-8555 OT Time Calculation (min): 17 min  Charges: OT General Charges $OT Visit: 1 Visit OT Treatments $Self Care/Home Management : 8-22 mins  Jasmine Arlean Shams, MS, OTR/L  Jasmine Shams 09/24/2024, 3:22 PM

## 2024-09-24 NOTE — Plan of Care (Signed)
  Problem: Education: Goal: Ability to describe self-care measures that may prevent or decrease complications (Diabetes Survival Skills Education) will improve Outcome: Progressing   Problem: Coping: Goal: Ability to adjust to condition or change in health will improve Outcome: Progressing   Problem: Fluid Volume: Goal: Ability to maintain a balanced intake and output will improve Outcome: Progressing   Problem: Health Behavior/Discharge Planning: Goal: Ability to identify and utilize available resources and services will improve Outcome: Progressing   Problem: Tissue Perfusion: Goal: Adequacy of tissue perfusion will improve Outcome: Progressing

## 2024-09-25 LAB — CBC
HCT: 35 % — ABNORMAL LOW (ref 36.0–46.0)
Hemoglobin: 11.7 g/dL — ABNORMAL LOW (ref 12.0–15.0)
MCH: 31.2 pg (ref 26.0–34.0)
MCHC: 33.4 g/dL (ref 30.0–36.0)
MCV: 93.3 fL (ref 80.0–100.0)
Platelets: 219 K/uL (ref 150–400)
RBC: 3.75 MIL/uL — ABNORMAL LOW (ref 3.87–5.11)
RDW: 16.2 % — ABNORMAL HIGH (ref 11.5–15.5)
WBC: 5.9 K/uL (ref 4.0–10.5)
nRBC: 0 % (ref 0.0–0.2)

## 2024-09-25 LAB — MAGNESIUM: Magnesium: 1.8 mg/dL (ref 1.7–2.4)

## 2024-09-25 LAB — GLUCOSE, CAPILLARY
Glucose-Capillary: 66 mg/dL — ABNORMAL LOW (ref 70–99)
Glucose-Capillary: 93 mg/dL (ref 70–99)
Glucose-Capillary: 69 mg/dL — ABNORMAL LOW (ref 70–99)
Glucose-Capillary: 125 mg/dL — ABNORMAL HIGH (ref 70–99)
Glucose-Capillary: 87 mg/dL (ref 70–99)
Glucose-Capillary: 72 mg/dL (ref 70–99)
Glucose-Capillary: 211 mg/dL — ABNORMAL HIGH (ref 70–99)
Glucose-Capillary: 140 mg/dL — ABNORMAL HIGH (ref 70–99)

## 2024-09-25 LAB — BASIC METABOLIC PANEL WITH GFR
Anion gap: 9 (ref 5–15)
BUN: 11 mg/dL (ref 8–23)
CO2: 21 mmol/L — ABNORMAL LOW (ref 22–32)
Calcium: 9.1 mg/dL (ref 8.9–10.3)
Chloride: 107 mmol/L (ref 98–111)
Creatinine, Ser: 0.95 mg/dL (ref 0.44–1.00)
GFR, Estimated: >60 mL/min (ref >60)
Glucose, Bld: 70 mg/dL (ref 70–99)
Potassium: 4.1 mmol/L (ref 3.5–5.1)
Sodium: 137 mmol/L (ref 135–145)

## 2024-09-25 LAB — PHOSPHORUS: Phosphorus: 2.3 mg/dL — ABNORMAL LOW (ref 2.5–4.6)

## 2024-09-25 MED ORDER — MAGNESIUM OXIDE -MG SUPPLEMENT 400 (240 MG) MG PO TABS
400.0000 mg | ORAL_TABLET | Freq: Every day | ORAL | Status: DC
  Administered 2024-09-25 – 2024-10-04 (×7): 400 mg via ORAL
  Filled 2024-09-25 (×7): qty 1

## 2024-09-25 MED ORDER — GLUCOSE 40 % PO GEL
1.0000 | Freq: Once | ORAL | Status: AC
  Administered 2024-09-25: 31 g via ORAL
  Filled 2024-09-25: qty 1

## 2024-09-25 MED ORDER — KCL IN DEXTROSE-NACL 20-5-0.9 MEQ/L-%-% IV SOLN
INTRAVENOUS | Status: DC
  Filled 2024-09-25 (×2): qty 1000

## 2024-09-25 MED ORDER — K PHOS MONO-SOD PHOS DI & MONO 155-852-130 MG PO TABS
500.0000 mg | ORAL_TABLET | Freq: Three times a day (TID) | ORAL | Status: AC
  Administered 2024-09-25 (×2): 500 mg via ORAL
  Filled 2024-09-25 (×2): qty 2

## 2024-09-25 MED ORDER — K PHOS MONO-SOD PHOS DI & MONO 155-852-130 MG PO TABS
500.0000 mg | ORAL_TABLET | Freq: Two times a day (BID) | ORAL | Status: DC
  Administered 2024-09-25 – 2024-09-26 (×3): 500 mg via ORAL
  Filled 2024-09-25 (×3): qty 2

## 2024-09-25 NOTE — Progress Notes (Signed)
 Progress Note   Patient: Shelby [name] [name] DOB: 1941-09-02 DOA: 09/15/2024     10 DOS: the patient was seen and examined on 09/25/2024    Brief hospital course:   Shelby [name] is a 83 y.o. female with medical history significant of Hyperparathyroidism, dementia, heart failure with reduced ejection fraction, anemia of chronic disease, dilated cardiomyopathy, type 2 diabetes, essential hypertension, hyperlipidemia, obstructive sleep apnea, peripheral neuropathy, chronic kidney disease stage III, history of seizure-like activity and urinary incontinence who was brought in from [facility] by family wanting her to be checked.  Patient was recently diagnosed with UTI pneumonia and CHF exacerbation.  She has been taking medications at the facility.  Family visited and noticed she is more confused.  Patient is currently unable to give history.  She also altered.  She responds to voice by opening her eyes.  Workup performed showed significant change from last month.  Her sodium is 150, calcium  14.2 and phosphorus only 1.  Patient also has known history of chronic kidney disease creatinine is 1.87.  Head CT without contrast showed no acute findings.  Nephrology consulted and recommends admission for treatment of hypercalcemia and hyponatremia.      Assessment and Plan:   # Acute metabolic encephalopathy: Most likely multifactorial.  Hypercalcemia and hypernatremia. UA negative  Continue supportive care, delirium precautions Fall precautions Monitor neuro function   # Hypercalcemia: Due to known history of hyperparathyroidism Continue to monitor on telemetry Continue Sensipar, patient was on it due to known history of hyperparathyroidism Patient received IV fluid  Nephrology consulted, pamidronate order placed Monitor calcium  level   # Hypernatremia: Resolved Continue to monitor   # Hypomagnesemia, mag repleted. # Hypokalemia, potassium repleted.   Resolved # Hypophosphatemia secondary to nutritional deficiency. Phos repleted. Check electrolytes daily   # Type 2 diabetes:  # Hypoglycemia due to decreased oral intake. Follow hypoglycemia protocol On SSI    # Essential hypertension: Off medications at this time Continue to monitor BP and titrate medications accordingly   Hypophosphatemia-continue repletion and monitoring  # Acute on chronic kidney disease stage IIIb: Renal function appears to be at baseline.  Continue to monitor Bladder scan negative for urinary retention US  Renal: Multiple bilateral renal lesions, as described above. No hydronephrosis.     # Hyperlipidemia: Will resume statin when stable.    # Dilated cardiomyopathy: Appears compensated.  Hold off medications for now, resume when patient is stable.   # Anorexia, poor appetite and frequent episodes of hypoglycemia Follow hypoglycemia protocol Encourage oral diet, continue ensures Continue Megace to stimulate appetite Continue assistance with feeding   # Pyuria, urine culture growing multiple species. Empirically started Augmentin twice daily for 3 days.     # Dementia: Currently obtunded. # obstructive sleep apnea: Consider CPAP at night.  # Obesity class II Body mass index is 36.25 kg/m.    Diet: Dysphagia 3 diet DVT Prophylaxis: Subcutaneous Heparin      Advance goals of care discussion: Full code   Family Communication: family was present at bedside, at the time of interview.    Disposition:  Pt is from SNF, admitted with AME due to hypercalcemia, and noticed hypernatremia, which improved with IV hydration.  Currently patient is stable to discharge. Discharge to SNF, when bed will be available.   10/15 as per TOC patient can go to SN[facility] tomorrow a.m. 10/16 as per [facility] patient has 7 days left for SNF and requires upfront payment which patient's son said  he cannot afford so plan is to send her home with home health.  Hospital bed  and [facility] lift order placed. Follow TOC for dispo plan    Subjective:  Patient seen and examined at bedside this morning Still having poor oral intake   Physical Exam: General: NAD, lying comfortably Appear in no distress, affect appropriate Eyes: PERRLA ENT: Oral Mucosa Clear, moist  Neck: no JVD,  Cardiovascular: S1 and S2 Present, no Murmur,  Respiratory: good respiratory effort, Bilateral Air entry equal and Decreased, no Crackles, no wheezes Abdomen: Bowel Sound present, Soft and no tenderness,  Skin: no rashes Extremities: no Pedal edema, no calf tenderness Neurologic: without any new focal findings Gait not checked due to patient safety concerns   Data Reviewed:   Vitals:   09/24/24 1622 09/24/24 2110 09/25/24 0431 09/25/24 0753  BP: (!) 116/58 116/68 117/74 (!) 111/90  Pulse: 74  73 67  Resp: 16 18 18 16   Temp: 99 F (37.2 C) 98.7 F (37.1 C) 98.8 F (37.1 C) 97.7 F (36.5 C)  TempSrc:      SpO2: 99% 100% 100% 100%  Weight:      Height:          Latest Ref Rng & Units 09/25/2024    5:32 AM 09/24/2024    3:32 AM 09/23/2024    5:39 AM  BMP  Glucose 70 - 99 mg/dL 70  79  70   BUN 8 - 23 mg/dL 11  14  15    Creatinine 0.44 - 1.00 mg/dL 9.04  9.10  8.87   Sodium 135 - 145 mmol/L 137  140  138   Potassium 3.5 - 5.1 mmol/L 4.1  3.7  3.7   Chloride 98 - 111 mmol/L 107  107  109   CO2 22 - 32 mmol/L 21  23  23    Calcium  8.9 - 10.3 mg/dL 9.1  9.5  9.1        Latest Ref Rng & Units 09/25/2024    5:32 AM 09/24/2024    3:32 AM 09/23/2024    5:39 AM  CBC  WBC 4.0 - 10.5 K/uL 5.9  6.3  6.0   Hemoglobin 12.0 - 15.0 g/dL 88.2  88.5  88.6   Hematocrit 36.0 - 46.0 % 35.0  35.2  34.0   Platelets 150 - 400 K/uL 219  210  196     Total time spent: 42 minutes  Author: Elvan Sor, MD 09/25/2024 12:19 PM  For on call review [email or web address].

## 2024-09-25 NOTE — Progress Notes (Signed)
 Hypoglycemic Event  CBG: 66  Treatment: 4 oz juice/soda  Symptoms: None  Follow-up CBG: Time: 0859 CBG Result: 93  Possible Reasons for Event: Inadequate meal intake  Comments/MD notified:Dr Von Lyle DELENA Raynold

## 2024-09-25 NOTE — Progress Notes (Signed)
 Hypoglycemic Event  CBG: 69   Treatment: 1 tube glucose gel  Symptoms: None  Follow-up CBG: Time:1320 CBG Result:125  Possible Reasons for Event: Inadequate meal intake  Comments/MD notified:Dr Von Lyle DELENA Raynold

## 2024-09-26 ENCOUNTER — Inpatient Hospital Stay

## 2024-09-26 LAB — BASIC METABOLIC PANEL WITH GFR
Anion gap: 10 (ref 5–15)
BUN: 9 mg/dL (ref 8–23)
CO2: 20 mmol/L — ABNORMAL LOW (ref 22–32)
Calcium: 9 mg/dL (ref 8.9–10.3)
Chloride: 109 mmol/L (ref 98–111)
Creatinine, Ser: 1.06 mg/dL — ABNORMAL HIGH (ref 0.44–1.00)
GFR, Estimated: 53 mL/min — ABNORMAL LOW (ref >60)
Glucose, Bld: 83 mg/dL (ref 70–99)
Potassium: 3.9 mmol/L (ref 3.5–5.1)
Sodium: 139 mmol/L (ref 135–145)

## 2024-09-26 LAB — PHOSPHORUS: Phosphorus: 3.6 mg/dL (ref 2.5–4.6)

## 2024-09-26 LAB — CBC
HCT: 37.1 % (ref 36.0–46.0)
Hemoglobin: 12.1 g/dL (ref 12.0–15.0)
MCH: 30.6 pg (ref 26.0–34.0)
MCHC: 32.6 g/dL (ref 30.0–36.0)
MCV: 93.7 fL (ref 80.0–100.0)
Platelets: 235 K/uL (ref 150–400)
RBC: 3.96 MIL/uL (ref 3.87–5.11)
RDW: 16.4 % — ABNORMAL HIGH (ref 11.5–15.5)
WBC: 6.1 K/uL (ref 4.0–10.5)
nRBC: 0 % (ref 0.0–0.2)

## 2024-09-26 LAB — GLUCOSE, CAPILLARY
Glucose-Capillary: 96 mg/dL (ref 70–99)
Glucose-Capillary: 90 mg/dL (ref 70–99)
Glucose-Capillary: 88 mg/dL (ref 70–99)
Glucose-Capillary: 110 mg/dL — ABNORMAL HIGH (ref 70–99)

## 2024-09-26 LAB — MAGNESIUM: Magnesium: 1.9 mg/dL (ref 1.7–2.4)

## 2024-09-26 MED ORDER — KCL IN DEXTROSE-NACL 40-5-0.9 MEQ/L-%-% IV SOLN
INTRAVENOUS | Status: AC
  Filled 2024-09-26 (×4): qty 1000

## 2024-09-26 NOTE — TOC Progression Note (Addendum)
 Transition of Care [facility]) - Progression Note    Patient Details  Name: Shelby [name] MRN: [id number] Date of Birth: 1940/12/25  Transition of [facility] CM/SW Contact  [name] Fuse, RN Phone Number: 09/26/2024, 3:07 PM  Clinical Narrative:     Referral for hospital bed and hoyer lift sent to [employer] last week. TOC spoke with Mitch at [employer], the bed and lift were dispatched on Friday and delivered to the patient's home on Saturday. Per the [facility] notes, [facility] will follow the patient for [facility] PT/OT. TOC made MD aware that equipment has been delivered.   TOC received update from the MD. The patient continues to have poor nutrition and hypglycemia. The plan is to place PEG tube tomorrow and potentially discharge on Wednesday.                 Expected Discharge Plan and Services                                               Social Drivers of Health (SDOH) Interventions SDOH Screenings   Food Insecurity: No Food Insecurity (08/18/2024)  Housing: Unknown (08/18/2024)  Transportation Needs: No Transportation Needs (08/18/2024)  Utilities: Not At Risk (08/18/2024)  Alcohol Screen: Low Risk  (12/20/2018)  Financial Resource Strain: Low Risk  (03/01/2024)   Received from [facility]  Physical Activity: Inactive (03/19/2021)   Received [facility]  Social Connections: Socially Isolated (08/18/2024)  Stress: No Stress Concern Present (03/19/2021)   Received from [facility]  Tobacco Use: Low Risk  (09/15/2024)    Readmission Risk Interventions     No data to display

## 2024-09-26 NOTE — Progress Notes (Signed)
 Physical Therapy Treatment Patient Details Name: Shelby [name] MRN: [id number] DOB: 16-Mar-1941 Today's Date: 09/26/2024   History of Present Illness Pt is an 83 y/o F admitted on 09/15/24 after being brought in by family with reports of more confusion, recently diagnosed with UTI, PNA, & CHF exacerbation. Pt is being treated for acute metabolic encephalopathy, hypercalcemia. Of note, pt with recent admission (08/2024) for L distal fibula fx, closed nondisplaced fx of R lateral malleolus. PMH: hyperparathyroidism, dementia, heart failure with reduced EF, anemia of chronic disease, dilated cardiomyopathy, DM2, HTN, HLD, OSA, peripheral neuropathy, CKD 3, seizure like activity, urinary incontinence    PT Comments  Pt received in bed, hygiene care given prior to sitting edge of bed with ModA. Pt able to balance with feet supported 10+ minutes. Several short bouts of standing with Stedy and heavy MaxA of 2 to attain upright posture. Unable to stand long enough to engage seat flaps, pt returned to bed. Recommend use of Hoyer Lift for OOB transfers at this time. Possible d/c home with son due to financial concerns, if this is the case, pt will benefit from a w/c, w/c cushion, hospital bed, hoyer lift, BSC.   If plan is discharge home, recommend the following: Two people to help with walking and/or transfers;Two people to help with bathing/dressing/bathroom;Direct supervision/assist for medications management;Assistance with cooking/housework;Direct supervision/assist for financial management;Supervision due to cognitive status;Assistance with feeding   Can travel by private vehicle     No  Equipment Recommendations  Other (comment);BSC/3in1;Wheelchair (measurements PT);Wheelchair cushion (measurements PT);Hospital bed;Hoyer lift (If unable to admit to STR)    Recommendations for Other Services       Precautions / Restrictions Precautions Precautions: Fall Recall of Precautions/Restrictions:  Impaired Precaution/Restrictions Comments: Per previous admission (08/2024), per podiatry note, recommend LLE CAM boot for mobility. Restrictions Weight Bearing Restrictions Per Provider Order: Yes RLE Weight Bearing Per Provider Order: Non weight bearing Other Position/Activity Restrictions:  (Per Podiatry f/u on 10/1 pt to maintain NWB in CAM boot, wt bearing in boot for transfers only)     Mobility  Bed Mobility Overal bed mobility: Needs Assistance Bed Mobility: Supine to Sit, Sit to Supine Rolling: Mod assist   Supine to sit: Mod assist, HOB elevated Sit to supine: Min assist   General bed mobility comments: required +2 to scoot up in bed    Transfers Overall transfer level: Needs assistance Equipment used: Ambulation equipment used Transfers: Sit to/from Stand Sit to Stand: Max assist, +2 physical assistance, From elevated surface           General transfer comment:  (Several sit to stand attempts with Stedy. Pt able to stand x 2 with MaxA of 2. Unable to maintain standing long enough to lower seat supports. R CAM boot applied in sitting) Transfer via Lift Equipment: Stedy  Ambulation/Gait               General Gait Details:  (Unable)   Stairs             Wheelchair Mobility     Tilt Bed    Modified Rankin (Stroke Patients Only)       Balance Overall balance assessment: Needs assistance Sitting-balance support: Feet supported Sitting balance-Leahy Scale: Fair     Standing balance support: Bilateral upper extremity supported, Reliant on assistive device for balance, During functional activity Standing balance-Leahy Scale: Poor Standing balance comment: able to fully clear bottom from mattress to achieve full standing with Max +2  Communication Communication Communication: Impaired Factors Affecting Communication: Hearing impaired  Cognition Arousal: Alert Behavior During Therapy: Lability   PT -  Cognitive impairments: No family/caregiver present to determine baseline, History of cognitive impairments                         Following commands: Impaired Following commands impaired: Follows one step commands inconsistently    Cueing Cueing Techniques: Verbal cues, Tactile cues, Gestural cues  Exercises Other Exercises Other Exercises:  (Pt educated on role of acute PT, benefits of OOB, and potential plans to d/c home with son)    General Comments General comments (skin integrity, edema, etc.):  (Open skin break down at several areas on buttocks.)      Pertinent Vitals/Pain Pain Assessment Pain Assessment: Faces Faces Pain Scale: Hurts a little bit Pain Location: R Ankle to touch/movement Pain Descriptors / Indicators: Discomfort, Tender Pain Intervention(s): Limited activity within patient's tolerance    Home Living                          Prior Function            PT Goals (current goals can now be found in the care plan section) Acute Rehab PT Goals Patient Stated Goal: none stated    Frequency    Min 1X/week      PT Plan      Co-evaluation              AM-PAC PT 6 Clicks Mobility   Outcome Measure  Help needed turning from your back to your side while in a flat bed without using bedrails?: Total Help needed moving from lying on your back to sitting on the side of a flat bed without using bedrails?: A Lot Help needed moving to and from a bed to a chair (including a wheelchair)?: Total Help needed standing up from a chair using your arms (e.g., wheelchair or bedside chair)?: Total Help needed to walk in hospital room?: Total Help needed climbing 3-5 steps with a railing? : Total 6 Click Score: 7    End of Session Equipment Utilized During Treatment: Gait belt Activity Tolerance: Patient limited by fatigue Patient left: in bed;with call bell/phone within reach;with bed alarm set;with nursing/sitter in room Nurse  Communication: Mobility status PT Visit Diagnosis: Muscle weakness (generalized) (M62.81);Other abnormalities of gait and mobility (R26.89);Difficulty in walking, not elsewhere classified (R26.2)     Time: 1055-1130 PT Time Calculation (min) (ACUTE ONLY): 35 min  Charges:    $Therapeutic Activity: 23-37 mins PT General Charges $$ ACUTE PT VISIT: 1 Visit                    Darice Bohr, PTA  Darice JAYSON Bohr 09/26/2024, 11:42 AM

## 2024-09-26 NOTE — Plan of Care (Signed)

## 2024-09-26 NOTE — Progress Notes (Signed)
 Progress Note   Patient: Shelby [name] [name] DOB: 1941/08/02 DOA: 09/15/2024     11 DOS: the patient was seen and examined on 09/26/2024    Brief hospital course:   Shelby [name] is a 83 y.o. female with medical history significant of Hyperparathyroidism, dementia, heart failure with reduced ejection fraction, anemia of chronic disease, dilated cardiomyopathy, type 2 diabetes, essential hypertension, hyperlipidemia, obstructive sleep apnea, peripheral neuropathy, chronic kidney disease stage III, history of seizure-like activity and urinary incontinence who was brought in from [facility] by family wanting her to be checked.  Patient was recently diagnosed with UTI pneumonia and CHF exacerbation.  She has been taking medications at the facility.  Family visited and noticed she is more confused.  Patient is currently unable to give history.  She also altered.  She responds to voice by opening her eyes.  Workup performed showed significant change from last month.  Her sodium is 150, calcium  14.2 and phosphorus only 1.  Patient also has known history of chronic kidney disease creatinine is 1.87.  Head CT without contrast showed no acute findings.  Nephrology consulted and recommends admission for treatment of hypercalcemia and hyponatremia.      Assessment and Plan:   # Acute metabolic encephalopathy: Most likely multifactorial.  Hypercalcemia and hypernatremia. UA negative  Continue supportive care, delirium precautions Fall precautions Monitor neuro function   # Hypercalcemia: Due to known history of hyperparathyroidism Continue to monitor on telemetry Continue Sensipar, patient was on it due to known history of hyperparathyroidism Patient received IV fluid  Nephrology consulted, pamidronate order placed Monitor calcium  level   # Hypernatremia: Resolved Continue to monitor   # Hypomagnesemia, mag repleted. # Hypokalemia, potassium repleted.   Resolved # Hypophosphatemia secondary to nutritional deficiency. Phos repleted. Check electrolytes daily   # Type 2 diabetes:  # Hypoglycemia due to decreased oral intake. Follow hypoglycemia protocol On SSI    # Essential hypertension: Off medications at this time Continue to monitor BP and titrate medications accordingly   Hypophosphatemia-continue repletion and monitoring  # Acute on chronic kidney disease stage IIIb: Renal function appears to be at baseline.  Continue to monitor Bladder scan negative for urinary retention US  Renal: Multiple bilateral renal lesions, as described above. No hydronephrosis.     # Hyperlipidemia: Will resume statin when stable.    # Dilated cardiomyopathy: Appears compensated.  Hold off medications for now, resume when patient is stable.   # Anorexia, poor appetite and frequent episodes of hypoglycemia Follow hypoglycemia protocol Encourage oral diet, continue ensures Continue Megace to stimulate appetite Continue assistance with feeding 10/20 d/w patient and her son for PEG tube insertion.  Patient has significant dementia, could not make a decision so patient's son agreed with the PEG tube insertion. Follow IR for PEG tube insertion Keep n.p.o. after midnight Continue IV fluid for hydration and to prevent hypoglycemia   # Pyuria, urine culture growing multiple species. S/p empiric Augmentin BID x 3 days.     # Dementia: Currently obtunded. # obstructive sleep apnea: Consider CPAP at night.  # Obesity class II Body mass index is 36.25 kg/m.    Diet: Dysphagia 3 diet DVT Prophylaxis: Subcutaneous Heparin      Advance goals of care discussion: Full code   Family Communication: family was present at bedside, at the time of interview.    Disposition:  Pt is from SNF, admitted with AME due to hypercalcemia, and noticed hypernatremia, which improved with IV hydration.  Currently  patient is stable to discharge. Discharge to SNF, when  bed will be available.   10/15 as per TOC patient can go to SN[facility] tomorrow a.m. 10/16 as per [facility] patient has 7 days left for SNF and requires upfront payment which patient's son said he cannot afford so plan is to send her home with home health.  Hospital bed and [facility] lift order placed. Follow TOC for dispo plan    Subjective:  Patient seen and examined at bedside this morning Still having poor oral intake   Physical Exam: General: NAD, lying comfortably Appear in no distress, affect appropriate Eyes: PERRLA ENT: Oral Mucosa Clear, moist  Neck: no JVD,  Cardiovascular: S1 and S2 Present, no Murmur,  Respiratory: good respiratory effort, Bilateral Air entry equal and Decreased, no Crackles, no wheezes Abdomen: Bowel Sound present, Soft and no tenderness,  Skin: no rashes Extremities: no Pedal edema, no calf tenderness Neurologic: without any new focal findings Gait not checked due to patient safety concerns   Data Reviewed:   Vitals:   09/25/24 1626 09/25/24 1949 09/26/24 0440 09/26/24 0827  BP: 114/64 106/81 122/63 (!) 108/58  Pulse: 73 84 62 65  Resp: 16 15 16 18   Temp: 99.1 F (37.3 C) 99.5 F (37.5 C) 98.7 F (37.1 C) 98.3 F (36.8 C)  TempSrc: Axillary Oral    SpO2: 100% 100% 99% 100%  Weight:      Height:          Latest Ref Rng & Units 09/26/2024    5:01 AM 09/25/2024    5:32 AM 09/24/2024    3:32 AM  BMP  Glucose 70 - 99 mg/dL 83  70  79   BUN 8 - 23 mg/dL 9  11  14    Creatinine 0.44 - 1.00 mg/dL 8.93  9.04  9.10   Sodium 135 - 145 mmol/L 139  137  140   Potassium 3.5 - 5.1 mmol/L 3.9  4.1  3.7   Chloride 98 - 111 mmol/L 109  107  107   CO2 22 - 32 mmol/L 20  21  23    Calcium  8.9 - 10.3 mg/dL 9.0  9.1  9.5        Latest Ref Rng & Units 09/26/2024    5:01 AM 09/25/2024    5:32 AM 09/24/2024    3:32 AM  CBC  WBC 4.0 - 10.5 K/uL 6.1  5.9  6.3   Hemoglobin 12.0 - 15.0 g/dL 87.8  88.2  88.5   Hematocrit 36.0 - 46.0 % 37.1  35.0  35.2    Platelets 150 - 400 K/uL 235  219  210     Total time spent: 42 minutes  Author: Elvan Sor, MD 09/26/2024 3:40 PM  For on call review [email or web address].

## 2024-09-26 NOTE — Progress Notes (Addendum)
 Educated patient bedpan burpose to maintain skin integrity with poor understanding. Bed mobiity training to increase bedmobility use to maintain skin integrity with poor return demonstration. Patient demonstrates failure to thrive as evidenced by urinating in bed with no notification of nursing staff.   Patient shows decreased motivation, refusing medication and meals requiring maximum encouragement. External urinary catheter placed to decrease further skin breakdown.

## 2024-09-27 DIAGNOSIS — E43 Unspecified severe protein-calorie malnutrition: Secondary | ICD-10-CM | POA: Insufficient documentation

## 2024-09-27 LAB — CBC
HCT: 35 % — ABNORMAL LOW (ref 36.0–46.0)
Hemoglobin: 11.2 g/dL — ABNORMAL LOW (ref 12.0–15.0)
MCH: 30.2 pg (ref 26.0–34.0)
MCHC: 32 g/dL (ref 30.0–36.0)
MCV: 94.3 fL (ref 80.0–100.0)
Platelets: 233 K/uL (ref 150–400)
RBC: 3.71 MIL/uL — ABNORMAL LOW (ref 3.87–5.11)
RDW: 16.7 % — ABNORMAL HIGH (ref 11.5–15.5)
WBC: 5.4 K/uL (ref 4.0–10.5)
nRBC: 0 % (ref 0.0–0.2)

## 2024-09-27 LAB — BASIC METABOLIC PANEL WITH GFR
Anion gap: 11 (ref 5–15)
BUN: 8 mg/dL (ref 8–23)
CO2: 20 mmol/L — ABNORMAL LOW (ref 22–32)
Calcium: 8.6 mg/dL — ABNORMAL LOW (ref 8.9–10.3)
Chloride: 113 mmol/L — ABNORMAL HIGH (ref 98–111)
Creatinine, Ser: 1.01 mg/dL — ABNORMAL HIGH (ref 0.44–1.00)
GFR, Estimated: 56 mL/min — ABNORMAL LOW (ref >60)
Glucose, Bld: 104 mg/dL — ABNORMAL HIGH (ref 70–99)
Potassium: 4.5 mmol/L (ref 3.5–5.1)
Sodium: 144 mmol/L (ref 135–145)

## 2024-09-27 LAB — GLUCOSE, CAPILLARY
Glucose-Capillary: 70 mg/dL (ref 70–99)
Glucose-Capillary: 80 mg/dL (ref 70–99)
Glucose-Capillary: 89 mg/dL (ref 70–99)
Glucose-Capillary: 100 mg/dL — ABNORMAL HIGH (ref 70–99)
Glucose-Capillary: 84 mg/dL (ref 70–99)

## 2024-09-27 LAB — PHOSPHORUS: Phosphorus: 2.7 mg/dL (ref 2.5–4.6)

## 2024-09-27 LAB — MAGNESIUM: Magnesium: 1.8 mg/dL (ref 1.7–2.4)

## 2024-09-27 MED ORDER — ENSURE MAX PROTEIN PO LIQD: 11.0000 [oz_av] | Freq: Two times a day (BID) | ORAL | Status: DC

## 2024-09-27 NOTE — Progress Notes (Addendum)
 Initial Nutrition Assessment  DOCUMENTATION CODES:   Severe malnutrition in context of acute illness/injury  INTERVENTION:   Once G-tube is in place and ready for use, recommend:  Osmolite 1.5- Give one carton ( ) 5 times daily via tube. Flush with 50ml of water before and after each feeding. (Will begin with 1/2 of a carton and advance as tolerated)   Regimen provides 1775kcal/day, 75g/day protein and 1405ml/day of free water,   Thiamine 100mg  daily via tube x 7 days   Pt at high refeed risk; recommend monitor potassium, magnesium  and phosphorus labs daily until stable  Juven Fruit Punch BID via tube, each serving provides 95kcal and 2.5g of protein (amino acids glutamine and arginine)  Daily weights   Ensure Max protein supplement po BID, each supplement provides 150kcal and 30g of protein.  NUTRITION DIAGNOSIS:   Severe Malnutrition related to acute illness as evidenced by severe fat depletion, severe muscle depletion, 13 percent weight loss in 2 months.  GOAL:   Patient will meet greater than or equal to 90% of their needs  MONITOR:   PO intake, Supplement acceptance, Labs, Weight trends, TF tolerance, I & O's, Skin  REASON FOR ASSESSMENT:   Consult Enteral/tube feeding initiation and management  ASSESSMENT:   83 y/o female with h/o dilated cardiomyopathy, HLD, OSA, CHF, DM, peripheral neuropathy, HTN, CKD III, dementia, kidney stones, IDA, poiter, pacreatic cyst, ankle fracture and recent UTI and pneumonia and who is now admitted with AMS, hypercalemia and FTT.  Met with pt in room today. Pt in good spirits laughing and making jokes. Pt is a poor historian. Pt reports poor appetite and oral intake for some time now. Pt reports weight loss pta but is unsure how much weight she has lost. Pt 's lunch tray was sitting on her side table untouched. Pt reports that she does not want any food but reports that she does love strawberry Ensure. When asked what if she  knows what is happening tomorrow, pt reports that she is unsure and refers me to her son.   Spoke with pt's son via the phone. Son reports that he just recently moved back home after being away for ten years. Son reports that patient has had poor appetite and oral intake for 1-1/2 to 2 months pta. Son reports that patient has lost a significant amount of weight in that time frame. Per chart, pt appears to be down ~ 70lbs over the past 3 years. Pt is down 22lbs(13%) over the past 2 months; this is significant weight loss. Per chart, it appears that patient has had multiple falls and FTT since August. Family has decided to pursue IR G-tube placement; this is scheduled for tomorrow. RD discussed with pt's son the recommended home tube feed regimen and what to expect. Pt will discharge home on bolus tube feeds as long as she is able to tolerate this regimen in hospital. Son reports that he is feeling overwhelmed and reports that he needs help in order to bring the patient home; apparently insurance will no longer cover SNF per son's report. RD recommended for patient to come and spend part of the day at [facility] on Thursday so that he can perform the tube feeds himself and start to get comfortable with this prior to discharge; pt is agreeable. Pt is at high refeed risk. Recommend for patient to remain in the hospital until her electrolytes are stable and she is able to tolerate her goal regimen.   Pt will discharge home  with [facility] for tube feeding supplies.    Medications reviewed and include: heparin , insulin , megace, Mg oxide, Kphos, NaCl w/ Kcl & 5% dextrose @75ml /hr  Labs reviewed: K 4.5 wnl, P 2.7 wnl, Mg 1.8 wnl, creat 1.01(H) Cbgs- 80, 70, 110, 88, 90, 96 x 48 hrs AIC 5.9(H)- 6/12  NUTRITION - FOCUSED PHYSICAL EXAM:  Flowsheet Row Most Recent Value  Orbital Region Mild depletion  Upper Arm Region Severe depletion  Thoracic and Lumbar Region Mild depletion  Buccal Region  No depletion  Temple Region Moderate depletion  Clavicle Bone Region Moderate depletion  Clavicle and Acromion Bone Region Moderate depletion  Scapular Bone Region Mild depletion  Dorsal Hand Severe depletion  Patellar Region Moderate depletion  Anterior Thigh Region Moderate depletion  Posterior Calf Region Severe depletion  Edema (RD Assessment) None  Hair Reviewed  Eyes Reviewed  Mouth Reviewed  Skin Reviewed  Nails Reviewed   Diet Order:   Diet Order             Diet NPO time specified  Diet effective midnight           DIET DYS 3 Room service appropriate? Yes; Fluid consistency: Thin  Diet effective now                  EDUCATION NEEDS:   Education needs have been addressed  Skin:  Skin Assessment: Reviewed RN Assessment (Stage II butt/sacrum- 3cm x 1.5cm x 1.0cm)  Last BM:  10/21- type 4  Height:   Ht Readings from Last 1 Encounters:  09/20/24 5' (1.524 m)    Weight:   Wt Readings from Last 1 Encounters:  09/27/24 70.3 kg    Ideal Body Weight:  45.45 kg  BMI:  Body mass index is 30.27 kg/m.  Estimated Nutritional Needs:   Kcal:  1600-1800kcal/day  Protein:  80-90g/day  Fluid:  1.4-1.6L/day  Augustin Shams MS, RD, LDN If unable to be reached, please send secure chat to RD inpatient available from 8:00a-4:00p daily

## 2024-09-27 NOTE — TOC Progression Note (Addendum)
 Transition of Care [facility]) - Progression Note    Patient Details  Name: Shelby [name] MRN: [id number] Date of Birth: 1941/07/27  [facility] CM/SW Contact  [name] Fuse, RN Phone Number: 09/27/2024, 2:43 PM  Clinical Narrative:    IR did not place feeding tube today. Patient is NPO after midnight tonight to have tube feed inserted tomorrow.   TOC spoke with the patient's son Shelby [name] and he will be available for teaching and education.   TOC sent referral to Holley Gander with [facility] for Tube Feedings. [facility] accepted the patient for PT/OT, TOC sent referral for RN and [facility] declined due to the patient's payor.   TOC sent referral for [facility] PT/OT and Aide in the hub and [facility] accepted.     Barriers to Discharge: Equipment Delay               Expected Discharge Plan and Services                                               Social Drivers of Health (SDOH) Interventions SDOH Screenings   Food Insecurity: No Food Insecurity (08/18/2024)  Housing: Unknown (08/18/2024)  Transportation Needs: No Transportation Needs (08/18/2024)  Utilities: Not At Risk (08/18/2024)  Alcohol Screen: Low Risk  (12/20/2018)  Financial Resource Strain: Low Risk  (03/01/2024)   Received from [facility]  Physical Activity: Inactive (03/19/2021)   Received from [facility]  Social Connections: Socially Isolated (08/18/2024)  Stress: No Stress Concern Present (03/19/2021)   Received from [facility]  Tobacco Use: Low Risk  (09/15/2024)    Readmission Risk Interventions     No data to display

## 2024-09-27 NOTE — Progress Notes (Signed)
 Progress Note   Patient: Shelby [name] [name] DOB: 10-28-1941 DOA: 09/15/2024     12 DOS: the patient was seen and examined on 09/27/2024    Brief hospital course:   Shelby [name] is a 83 y.o. female with medical history significant of Hyperparathyroidism, dementia, heart failure with reduced ejection fraction, anemia of chronic disease, dilated cardiomyopathy, type 2 diabetes, essential hypertension, hyperlipidemia, obstructive sleep apnea, peripheral neuropathy, chronic kidney disease stage III, history of seizure-like activity and urinary incontinence who was brought in from [facility] by family wanting her to be checked.  Patient was recently diagnosed with UTI pneumonia and CHF exacerbation.  She has been taking medications at the facility.  Family visited and noticed she is more confused.  Patient is currently unable to give history.  She also altered.  She responds to voice by opening her eyes.  Workup performed showed significant change from last month.  Her sodium is 150, calcium  14.2 and phosphorus only 1.  Patient also has known history of chronic kidney disease creatinine is 1.87.  Head CT without contrast showed no acute findings.  Nephrology consulted and recommends admission for treatment of hypercalcemia and hyponatremia.      Assessment and Plan:   # Acute metabolic encephalopathy: Most likely multifactorial.  Hypercalcemia and hypernatremia. UA negative  Continue supportive care, delirium precautions Fall precautions Monitor neuro function   # Hypercalcemia: Resolved  Due to known history of hyperparathyroidism Continue to monitor on telemetry Continue Sensipar, patient was on it due to known history of hyperparathyroidism Patient received IV fluid  Nephrology consulted, s/p pamidronate  Monitor calcium  level   # Hypernatremia: Resolved Continue to monitor   # Hypomagnesemia, mag repleted. # Hypokalemia, potassium repleted.   Resolved # Hypophosphatemia secondary to nutritional deficiency. Phos repleted. Check electrolytes daily   # Type 2 diabetes:  # Hypoglycemia due to decreased oral intake. Follow hypoglycemia protocol On SSI    # Essential hypertension: Off medications at this time Continue to monitor BP and titrate medications accordingly    # Acute on chronic kidney disease stage IIIb: Renal function appears to be at baseline.  Continue to monitor Bladder scan negative for urinary retention US  Renal: Multiple bilateral renal lesions, as described above. No hydronephrosis.     # Hyperlipidemia: Will resume statin when stable.    # Dilated cardiomyopathy: Appears compensated.  Hold off medications for now, resume when patient is stable.   # Anorexia, poor appetite and frequent episodes of hypoglycemia Follow hypoglycemia protocol Encourage oral diet, continue ensures Continue Megace to stimulate appetite Continue assistance with feeding 10/20 d/w patient and her son for PEG tube insertion.  Patient has significant dementia, could not make a decision so patient's son agreed with the PEG tube insertion. Follow IR for PEG tube insertion, will be done tomorrow on 10/22 Keep n.p.o. after midnight Continue IV fluid for hydration and to prevent hypoglycemia   # Pyuria, urine culture growing multiple species. S/p empiric Augmentin BID x 3 days.     # Dementia: Currently obtunded. # obstructive sleep apnea: Consider CPAP at night.  # Obesity class II Body mass index is 36.25 kg/m.    Diet: Dysphagia 3 diet DVT Prophylaxis: Subcutaneous Heparin      Advance goals of care discussion: Full code   Family Communication: family was present at bedside, at the time of interview.    Disposition:  Pt is from SNF, admitted with AME due to hypercalcemia, and noticed hypernatremia, which improved with  IV hydration.  Currently patient is stable to discharge. Discharge to SNF, when bed will be available.    10/15 as per TOC patient can go to SN[facility] tomorrow a.m. 10/16 as per [facility] patient has 7 days left for SNF and requires upfront payment which patient's son said he cannot afford so plan is to send her home with home health.  Hospital bed and [facility] lift order placed. 10/21 scheduled for PEG tube insertion by IR on 10/22, most likely discharge on 10/23 if remains stable. Follow TOC for DC plan with home health services.  DME delivered.     Subjective:  Patient seen and examined at bedside this morning Still having poor oral intake   Physical Exam: General: NAD, lying comfortably Appear in no distress, affect appropriate Eyes: PERRLA ENT: Oral Mucosa Clear, moist  Neck: no JVD,  Cardiovascular: S1 and S2 Present, no Murmur,  Respiratory: good respiratory effort, Bilateral Air entry equal and Decreased, no Crackles, no wheezes Abdomen: Bowel Sound present, Soft and no tenderness,  Skin: no rashes Extremities: no Pedal edema, no calf tenderness Neurologic: without any new focal findings Gait not checked due to patient safety concerns   Data Reviewed:   Vitals:   09/26/24 2014 09/27/24 0423 09/27/24 0843 09/27/24 1600  BP: 120/64 (!) 138/58 136/67 131/66  Pulse: 75 (!) 59 63 65  Resp: 16 15 17 18   Temp: 98.2 F (36.8 C) 98 F (36.7 C) 98.1 F (36.7 C) 98.2 F (36.8 C)  TempSrc:    Oral  SpO2: 97% 100% 98%   Weight:    70.3 kg  Height:          Latest Ref Rng & Units 09/27/2024    7:19 AM 09/26/2024    5:01 AM 09/25/2024    5:32 AM  BMP  Glucose 70 - 99 mg/dL 895  83  70   BUN 8 - 23 mg/dL 8  9  11    Creatinine 0.44 - 1.00 mg/dL 8.98  8.93  9.04   Sodium 135 - 145 mmol/L 144  139  137   Potassium 3.5 - 5.1 mmol/L 4.5  3.9  4.1   Chloride 98 - 111 mmol/L 113  109  107   CO2 22 - 32 mmol/L 20  20  21    Calcium  8.9 - 10.3 mg/dL 8.6  9.0  9.1        Latest Ref Rng & Units 09/27/2024    7:19 AM 09/26/2024    5:01 AM 09/25/2024    5:32 AM  CBC  WBC 4.0  - 10.5 K/uL 5.4  6.1  5.9   Hemoglobin 12.0 - 15.0 g/dL 88.7  87.8  88.2   Hematocrit 36.0 - 46.0 % 35.0  37.1  35.0   Platelets 150 - 400 K/uL 233  235  219     Total time spent: 55 minutes  Author: Elvan Sor, MD 09/27/2024 4:32 PM  For on call review [email or web address].

## 2024-09-27 NOTE — Consult Note (Signed)
 Chief Complaint: Patient was seen in consultation today for anorexia, poor nutrition.  Referring Physician(s): Von Bellis, MD  Supervising Physician: Luverne Aran  Patient Status: [facility] - In-pt  History of Present Illness: Shelby [name] is a 84 y.o. female with a past medical history significant for dementia, OSA, urinary incontinencen, hyperparathyroidism with hypercalcemia, anemia, DM, CHF, HTN, HLD, CKD III who presented to the ED on 09/15/24 with concerns altered mental status and weakness. Shelby [name] is a resident at [facility] and was brought to the ED per her family request due to increased confusion. She had recently been treated for UTI and pneumonia however her mental status did not improve. Initial workup in the ED significant for hypernatremia, hypercalcemia, hypophosphatemia. CT head was obtained which showed no acute concerns. She was admitted for further evaluation and management of hypercalcemia.   During this admission Ms. Amodio was noted to have a poor appetite with recurrent hypoglycemia. Due to poor oral intake and poor nutrition IR has been consulted for gastrostomy placement.   Patient seen on the floor, no family at bedside - she is able to tell me her full name and birthday but only laughs when I ask her if she knows what hospital she's in or what the year is. Briefly explained the purpose of my visit and that we plan to proceed with G tube placement tomorrow afternoon - she is in agreement with this. Attempted to call patient's son [place] for consent with no answer.  Discussed outpatient care with hospitalist today - plan is for patient to go home with son instead of return to SNF and he will be primary caretaker. Dietician will see after G tube is placed for education with son. PCP to order supplies/feeds.   Past Medical History:  Diagnosis Date   Anemia    Cardiomyopathy (HCC)    Ejection Fraction 30-35% per ECHO 2016   CHF (congestive  heart failure) (HCC)    Diabetes (HCC)    Dyspnea    Dysrhythmia    Hyperlipidemia    Hypertension    Peripheral neuropathy    Peripheral neuropathy    Sleep apnea     Past Surgical History:  Procedure Laterality Date   ABDOMINAL HYSTERECTOMY     COLONOSCOPY     COLONOSCOPY N/A 04/01/2018   Procedure: COLONOSCOPY;  Surgeon: Gaylyn Gladis PENNER, MD;  Location: [facility] ENDOSCOPY;  Service: Endoscopy;  Laterality: N/A;   COLONOSCOPY WITH PROPOFOL  N/A 12/29/2017   Procedure: COLONOSCOPY WITH PROPOFOL ;  Surgeon: Gaylyn Gladis PENNER, MD;  Location: [facility] ENDOSCOPY;  Service: Endoscopy;  Laterality: N/A;   ESOPHAGOGASTRODUODENOSCOPY (EGD) WITH PROPOFOL  N/A 12/29/2017   Procedure: ESOPHAGOGASTRODUODENOSCOPY (EGD) WITH PROPOFOL ;  Surgeon: Gaylyn Gladis PENNER, MD;  Location: [facility] ENDOSCOPY;  Service: Endoscopy;  Laterality: N/A;   ESOPHAGOGASTRODUODENOSCOPY (EGD) WITH PROPOFOL  N/A 04/01/2018   Procedure: ESOPHAGOGASTRODUODENOSCOPY (EGD) WITH PROPOFOL ;  Surgeon: Gaylyn Gladis PENNER, MD;  Location: [facility] ENDOSCOPY;  Service: Endoscopy;  Laterality: N/A;   EUS N/A 05/20/2018   Procedure: ESOPHAGEAL ENDOSCOPIC ULTRASOUND (EUS) RADIAL;  Surgeon: Queenie Asberry LABOR, MD;  Location: [facility] ENDOSCOPY;  Service: Gastroenterology;  Laterality: N/A;    Allergies: Furosemide   Medications: Prior to Admission medications   Medication Sig Start Date End Date Taking? Authorizing Provider  acetaminophen  (TYLENOL ) 325 MG tablet Take 2 tablets (650 mg total) by mouth every 6 (six) hours as needed for mild pain (pain score 1-3) (or Fever >/= 100.4). 08/23/24  Yes Lenon Marien CROME, MD  amLODipine  (NORVASC ) 10 MG  tablet Take 10 mg by mouth daily.   Yes [provider]  ascorbic acid  (VITAMIN C ) 500 MG tablet Take 500 mg by mouth daily.   Yes [provider]  aspirin  EC 81 MG tablet Take 81 mg by mouth daily.   Yes [provider]  atorvastatin  (LIPITOR) 80 MG tablet Take 80 mg by mouth daily.    Yes [provider]  carvedilol  (COREG ) 25 MG tablet Take 25 mg by mouth 2 (two) times daily with a meal.   Yes [provider]  cinacalcet (SENSIPAR) 30 MG tablet Take 30 mg by mouth daily with breakfast. 09/07/24 12/06/24 Yes [provider]  dapagliflozin  propanediol (FARXIGA ) 10 MG TABS tablet Take 1 tablet by mouth daily. 03/01/24  Yes [provider]  donepezil  (ARICEPT ) 10 MG tablet Take 10 mg by mouth at bedtime.   Yes [provider]  ferrous sulfate  325 (65 FE) MG tablet Take by mouth. Takes one pill every 2-3 days to minimize constipation   Yes [provider]  gabapentin  (NEURONTIN ) 300 MG capsule Take 300 mg by mouth 3 (three) times daily.   Yes [provider]  hydrochlorothiazide  (MICROZIDE ) 12.5 MG capsule Take 12.5 mg by mouth daily. 03/01/24  Yes [provider]  magnesium  oxide (MAG-OX) 400 MG tablet Take 400 mg by mouth daily.    Yes [provider]  nystatin  (MYCOSTATIN /NYSTOP ) powder Apply topically as needed (Apply to Groin; Skin folds as needed). 08/23/24  Yes Lenon Marien CROME, MD  torsemide  (DEMADEX ) 20 MG tablet Take 20 mg by mouth daily.   Yes [provider]  travoprost , benzalkonium, (TRAVATAN ) 0.004 % ophthalmic solution Place 1 drop into both eyes at bedtime.    Yes [provider]  traZODone  (DESYREL ) 50 MG tablet Take 50 mg by mouth at bedtime.   Yes [provider]     Family History  Problem Relation Age of Onset   Diabetes Mother    Heart disease Mother    Bladder Cancer Neg Hx    Kidney cancer Neg Hx     Social History   Socioeconomic History   Marital status: Widowed    Spouse name: Not on file   Number of children: Not on file   Years of education: Not on file   Highest education level: Not on file  Occupational History   Not on file  Tobacco Use   Smoking status: Never   Smokeless tobacco: Never  Vaping Use   Vaping status: Never Used   Substance and Sexual Activity   Alcohol use: No   Drug use: No   Sexual activity: Not Currently    Birth control/protection: Post-menopausal  Other Topics Concern   Not on file  Social History Narrative   Not on file   Social Drivers of Health   Financial Resource Strain: Low Risk  (03/01/2024)   Received from [facility]   Overall Financial Resource Strain (CARDIA)    Difficulty of Paying Living Expenses: Not hard at all  Food Insecurity: No Food Insecurity (08/18/2024)   Hunger Vital Sign    Worried About Running Out of Food in the Last Year: Never true    Ran Out of Food in the Last Year: Never true  Transportation Needs: No Transportation Needs (08/18/2024)   PRAPARE - Administrator, Civil Service (Medical): No    Lack of Transportation (Non-Medical): No  Physical Activity: Inactive (03/19/2021)   Received from [facility]   Exercise Vital Sign    On average, how many days per week do you engage in moderate to strenuous exercise (like a brisk walk)?: 0 days    On average, how many minutes do you engage in exercise at this level?: 0 min  Stress: No Stress Concern Present (03/19/2021)   Received from [facility] of Occupational Health - Occupational Stress Questionnaire    Feeling of Stress : Not at all  Social Connections: Socially Isolated (08/18/2024)   Social Connection and Isolation Panel    Frequency of Communication with Friends and Family: Once a week    Frequency of Social Gatherings with Friends and Family: Once a week    Attends Religious Services: 1 to 4 times per year    Active Member of [employer] or Organizations: Not on file    Attends Banker Meetings: Never    Marital Status: Widowed     Review of Systems: A 12 point ROS discussed and pertinent positives are indicated in the HPI above.  All other systems are negative.  Review of Systems  Constitutional:         Limited ROS - patient declines to answer most questions  Respiratory:  Negative for shortness of breath.   Cardiovascular:  Negative for chest pain.  Gastrointestinal:  Negative for abdominal pain.    Vital Signs: BP 136/67 (BP Location: Right Arm)   Pulse 63   Temp 98.1 F (36.7 C)   Resp 17   Ht 5' (1.524 m)   Wt 185 lb 10 oz (84.2 kg)   SpO2 98%   BMI 36.25 kg/m   Physical Exam Vitals reviewed.  Constitutional:      General: She is not in acute distress. HENT:     Head: Normocephalic.     Mouth/Throat:     Mouth: Mucous membranes are dry.     Pharynx: Oropharynx is clear. No oropharyngeal exudate or posterior oropharyngeal erythema.  Cardiovascular:     Rate and Rhythm: Normal rate and regular rhythm.  Pulmonary:     Effort: Pulmonary effort is normal.     Breath sounds: Normal breath sounds.  Abdominal:     General: There is no distension.     Palpations: Abdomen is soft.     Tenderness: There is no abdominal tenderness.  Skin:    General: Skin is warm and dry.  Neurological:     Mental Status: She is alert.      MD Evaluation Airway: WNL Heart: WNL Abdomen: WNL Chest/ Lungs: WNL ASA  Classification: 3 Mallampati/Airway Score: Two   Imaging: CT ABDOMEN WO CONTRAST Result Date: 09/26/2024 EXAM: CT ABDOMEN WITHOUT CONTRAST 09/26/2024 04:46:00 PM TECHNIQUE: CT of the abdomen was performed without the administration of intravenous contrast. Multiplanar reformatted images are provided for review. Automated exposure control, iterative reconstruction, and/or weight based adjustment of the mA/kV was utilized to reduce the radiation dose to as low as reasonably achievable. COMPARISON: Comparison with 05/25/2018 and MRI abdomen 09/17/2024. CLINICAL HISTORY: Gastrostomy tube evaluation. Gastrostomy tube evaluation; For IR procedure. FINDINGS: LOWER CHEST: Visualized portion of the lower chest demonstrates no acute abnormality. HEPATOBILIARY: Cholelithiasis. Distended  gallbladder. No evidence of acute cholecystitis. SPLEEN: Spleen demonstrates no acute abnormality. PANCREAS: Pancreas demonstrates no acute abnormality. ADRENAL GLANDS: Adrenal glands demonstrate no acute abnormality. KIDNEYS: Multiple hyperdense renal cysts better evaluated on MRI 09/17/2024. See that report for recommendations. No stones in the kidneys or proximal ureters. No  hydronephrosis. No perinephric or periureteral stranding. GI AND BOWEL: Stomach and duodenal sweep demonstrate no acute abnormality. Duodenal diverticulum. There is no bowel obstruction. No abnormal bowel wall thickening or distension. PERITONEUM AND RETROPERITONEUM: No ascites or free air. Aorta is normal in caliber. Aortic atherosclerotic calcification. LYMPH NODES: No lymphadenopathy. BONES AND SOFT TISSUES: No acute abnormality of the visualized bones. No focal soft tissue abnormality. IMPRESSION: 1. No acute abnormality in the abdomen. 2. Multiple hyperdense renal cysts; further evaluation and recommendations per MRI 09/17/24. Electronically signed by: Norman Gatlin MD 09/26/2024 09:38 PM EDT RP Workstation: [id number]   US  RENAL Result Date: 09/17/2024 CLINICAL DATA:  [id number] AKI (acute kidney injury) [id number] EXAM: RENAL / URINARY TRACT ULTRASOUND COMPLETE COMPARISON:  None Available. FINDINGS: Right Kidney: Renal measurements: 3.4 x 5.5 x 9.0 cm = volume: 88.4 mL. Echogenicity within normal limits. No hydronephrosis. There are multiple anechoic structures in the right kidney with largest measuring up to 1.8 x 1.9 x 2.6 cm. Majority of these are favored to represent simple cysts. However, not all the renal lesions are seen on this exam. Please refer to MRI abdomen report from the same day for details. Left Kidney: Renal measurements: 4.6 x 4.7 x 8.5 cm = volume: 95.7 mL. Echogenicity within normal limits. No hydronephrosis. There are multiple anechoic structures in the left kidney with largest measuring up to 3.4 x 3.5 x 3.5 cm.  Majority of these are favored to represent simple cysts. However, not all the renal lesions are seen on this exam. Please refer to MRI abdomen report from the same day for details. Bladder: Appears normal for degree of bladder distention. Other: None. IMPRESSION: *Multiple bilateral renal lesions, as described above. No hydronephrosis. Electronically Signed   By: Ree Molt M.D.   On: 09/17/2024 17:38   MR ABDOMEN W WO CONTRAST Result Date: 09/17/2024 CLINICAL DATA:  Right renal mass, patient unable to hold breath on inspiration EXAM: MRI ABDOMEN WITHOUT AND WITH CONTRAST TECHNIQUE: Multiplanar multisequence MR imaging of the abdomen was performed both before and after the administration of intravenous contrast. CONTRAST:  8mL GADAVIST  GADOBUTROL  1 MMOL/ML IV SOLN COMPARISON:  MRI August 21, 2022. FINDINGS: Despite efforts by the technologist and patient, motion artifact is present on today's exam and could not be eliminated. This reduces exam sensitivity and specificity. Lower chest: Heterogeneous signal in the bilateral lung bases likely reflects atelectasis. Hepatobiliary: No significant hepatic steatosis or iron deposition. Nonenhancing lesion in the hepatic dome not well evaluated on today's motion degraded examination. Sludge filled gallbladder is distended without wall thickening or pericholecystic fluid. No biliary ductal dilation. Pancreas: No pancreatic ductal dilation or evidence of acute inflammation. Stable cystic lesion in the pancreatic head measuring 7 mm on image 26/24. Spleen:  No splenomegaly. Adrenals/Urinary Tract: The previously indexed 9 mm enhancing lesion off the posterior interpolar right kidney which measured 9 mm on image 57/15 on MRI August 21, 2022, 9 mm on MRI April 01, 2021 on image 59/19 and 8 mm when remeasured for consistency on MRI April 03, 2020 on image 52/14 of that examination. On today's examination the lesion is likely identified on image 57/19, 59/17 and image  26/24 measuring 7 mm. Numerous bilateral renal lesions of varying degrees of complexity some of which are intrinsically T1 hyperintense and some which are intrinsically T2 hypointense, but are poorly evaluated on this examination which is significantly degraded by motion. Stomach/Bowel: Stomach is nondistended. No evidence of bowel obstruction. Vascular/Lymphatic: Normal caliber abdominal aorta. No pathologically enlarged  abdominal or pelvic lymph nodes. Other:  No significant abdominal free fluid. Musculoskeletal: No suspicious osseous lesion. IMPRESSION: Severely motion degraded examination limits sensitivity and specificity. Within this context: 1. The previously indexed 9 mm enhancing lesion off the posterior interpolar right kidney is not well evaluated on today's examination but appears grossly stable in size. Suggest continued surveillance imaging in short interval, in 6 months, upon resolution of patient's current symptomatology when they are better able to follow commands and maintain breath hold. 2. Numerous bilateral renal lesions of varying degrees of complexity some of which are intrinsically T1 hyperintense are poorly evaluated on this examination which is significantly degraded by motion. Suggest attention on follow-up imaging. 3. 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 Recommend attention on follow-up MRI. 4. Sludge filled gallbladder is distended without wall thickening or pericholecystic fluid, suggest correlation for right upper quadrant tenderness to palpation. Electronically Signed   By: Reyes Holder M.D.   On: 09/17/2024 08:26   CT Head Wo Contrast Result Date: 09/15/2024 CLINICAL DATA:  Altered mental status. EXAM: CT HEAD WITHOUT CONTRAST TECHNIQUE: Contiguous axial images were obtained from the base of the skull through the vertex without intravenous contrast. RADIATION DOSE REDUCTION: This exam was performed according to the departmental  dose-optimization program which includes automated exposure control, adjustment of the mA and/or kV according to patient size and/or use of iterative reconstruction technique. COMPARISON:  07/21/2024 FINDINGS: Brain: No intracranial hemorrhage, mass effect, or midline shift. Stable atrophy. No hydrocephalus. The basilar cisterns are patent. Stable chronic small vessel ischemia. No evidence of territorial infarct or acute ischemia. No extra-axial or intracranial fluid collection. Vascular: Atherosclerosis of skullbase vasculature without hyperdense vessel or abnormal calcification. Skull: No fracture or focal lesion. Sinuses/Orbits: No acute finding. Other: None. IMPRESSION: 1. No acute intracranial abnormality. 2. Stable atrophy and chronic small vessel ischemia. Electronically Signed   By: Andrea Gasman M.D.   On: 09/15/2024 18:10    Labs:  CBC: Recent Labs    09/24/24 0332 09/25/24 0532 09/26/24 0501 09/27/24 0719  WBC 6.3 5.9 6.1 5.4  HGB 11.4* 11.7* 12.1 11.2*  HCT 35.2* 35.0* 37.1 35.0*  PLT 210 219 235 233    COAGS: No results for input(s): INR, APTT in the last 8760 hours.  BMP: Recent Labs    09/24/24 0332 09/25/24 0532 09/26/24 0501 09/27/24 0719  NA 140 137 139 144  K 3.7 4.1 3.9 4.5  CL 107 107 109 113*  CO2 23 21* 20* 20*  GLUCOSE 79 70 83 104*  BUN 14 11 9 8   CALCIUM  9.5 9.1 9.0 8.6*  CREATININE 0.89 0.95 1.06* 1.01*  GFRNONAA >60 >60 53* 56*    LIVER FUNCTION TESTS: Recent Labs    07/21/24 1050 08/08/24 1024 09/15/24 1440 09/16/24 0500  BILITOT 1.0 0.7 0.7 0.6  AST 24 24 37 37  ALT 9 12 26 23   ALKPHOS 47 62 59 61  PROT 6.9 6.8 7.3 6.8  ALBUMIN  3.3* 3.4* 3.4* 3.2*    TUMOR MARKERS: No results for input(s): AFPTM, CEA, CA199, CHROMGRNA in the last 8760 hours.  Assessment and Plan:  83 y/o F admitted with weakness and AMS found to have hypercalcemia for which she is currently being treated. During admission she has had poor oral  intake and recurrent episodes of hypoglycemia. IR has been consulted for G tube placement for supplemental nutrition.  Patient to return home next week with son Jamirah Zelaya who  will manage the G tube. PCP will manage supplies/feeds. Dietician to provide G tube education after tube is placed.   Plan: - G tube placement scheduled for 09/28/24 at 13:30 in IR - NPO at 0500 on 10/22, may have sips with meds - Hold 0600 and 1400 heparin  SQ tomorrow - Will call patient's son tomorrow morning for consent, not at bedside and unable to reach by phone this afternoon  Please call IR with questions or concerns.  Thank you for this interesting consult.  I greatly enjoyed meeting Kasen Sako and look forward to participating in their care.  A copy of this report was sent to the requesting provider on this date.  Electronically Signed: Clotilda DELENA Hesselbach, PA-C 09/27/2024, 1:44 PM   I spent a total of 40 Minutes in face to face in clinical consultation, greater than 50% of which was counseling/coordinating care for poor oral intake.

## 2024-09-27 NOTE — Plan of Care (Signed)
   Problem: Metabolic: Goal: Ability to maintain appropriate glucose levels will improve Outcome: Progressing

## 2024-09-27 NOTE — Plan of Care (Signed)
 Patient has remained NPO for possible g-tube placement.  Remains free from any noted signs of acute distress.  Continues to receive IVF per MD order via right wrist PIV.  Wound care to sacral area performed.  Patient noted to tolerate all activities.  No additional medical interventions required at this time.  Patient to continue to be monitored by hospital staff until discharged.

## 2024-09-27 NOTE — Consult Note (Addendum)
 WOC Nurse wound follow up Wound type: pressure injury; weekly follow up Measurement:3cm x 1.5cm x 1.0cm  Wound azi:ejoz, pink, clean Drainage (amount, consistency, odor) minimal Periwound: intact  Dressing procedure/placement/frequency: DC Santyl, begin silver Hydrofiber topped with foam daily.   Turn and reposition per hospital policy Pending Gtube for nutritional support  WOC Nurse team will follow with you and see patient within 10 days for wound assessments.  Please notify WOC nurses of any acute changes in the wounds or any new areas of concern Tameika [facility] MSN, RN,CWOCN, CNS, CWON-AP [phone]

## 2024-09-27 NOTE — Progress Notes (Signed)
 TOC spoke with Shaun with [facility]. Plan for start of care for [facility] PT/OT on Monday 10/27. TOC confirmed that RN can see the patient this week.

## 2024-09-28 ENCOUNTER — Inpatient Hospital Stay: Admitting: Radiology

## 2024-09-28 HISTORY — PX: IR GASTROSTOMY TUBE MOD SED: IMG625

## 2024-09-28 LAB — CBC
HCT: 35.2 % — ABNORMAL LOW (ref 36.0–46.0)
Hemoglobin: 11.4 g/dL — ABNORMAL LOW (ref 12.0–15.0)
MCH: 30.8 pg (ref 26.0–34.0)
MCHC: 32.4 g/dL (ref 30.0–36.0)
MCV: 95.1 fL (ref 80.0–100.0)
Platelets: 225 K/uL (ref 150–400)
RBC: 3.7 MIL/uL — ABNORMAL LOW (ref 3.87–5.11)
RDW: 17 % — ABNORMAL HIGH (ref 11.5–15.5)
WBC: 5.4 K/uL (ref 4.0–10.5)
nRBC: 0 % (ref 0.0–0.2)

## 2024-09-28 LAB — GLUCOSE, CAPILLARY
Glucose-Capillary: 60 mg/dL — ABNORMAL LOW (ref 70–99)
Glucose-Capillary: 84 mg/dL (ref 70–99)
Glucose-Capillary: 61 mg/dL — ABNORMAL LOW (ref 70–99)
Glucose-Capillary: 77 mg/dL (ref 70–99)
Glucose-Capillary: 72 mg/dL (ref 70–99)
Glucose-Capillary: 87 mg/dL (ref 70–99)
Glucose-Capillary: 90 mg/dL (ref 70–99)
Glucose-Capillary: 94 mg/dL (ref 70–99)
Glucose-Capillary: 88 mg/dL (ref 70–99)

## 2024-09-28 LAB — PHOSPHORUS: Phosphorus: 1.8 mg/dL — ABNORMAL LOW (ref 2.5–4.6)

## 2024-09-28 LAB — BASIC METABOLIC PANEL WITH GFR
Anion gap: 6 (ref 5–15)
Anion gap: 7 (ref 5–15)
BUN: 6 mg/dL — ABNORMAL LOW (ref 8–23)
BUN: 6 mg/dL — ABNORMAL LOW (ref 8–23)
CO2: 18 mmol/L — ABNORMAL LOW (ref 22–32)
CO2: 18 mmol/L — ABNORMAL LOW (ref 22–32)
Calcium: 8.4 mg/dL — ABNORMAL LOW (ref 8.9–10.3)
Calcium: 8.7 mg/dL — ABNORMAL LOW (ref 8.9–10.3)
Chloride: 118 mmol/L — ABNORMAL HIGH (ref 98–111)
Chloride: 114 mmol/L — ABNORMAL HIGH (ref 98–111)
Creatinine, Ser: 0.86 mg/dL (ref 0.44–1.00)
Creatinine, Ser: 0.92 mg/dL (ref 0.44–1.00)
GFR, Estimated: >60 mL/min (ref >60)
GFR, Estimated: >60 mL/min (ref >60)
Glucose, Bld: 85 mg/dL (ref 70–99)
Glucose, Bld: 80 mg/dL (ref 70–99)
Potassium: 5.4 mmol/L — ABNORMAL HIGH (ref 3.5–5.1)
Potassium: 4.6 mmol/L (ref 3.5–5.1)
Sodium: 142 mmol/L (ref 135–145)
Sodium: 139 mmol/L (ref 135–145)

## 2024-09-28 LAB — MAGNESIUM: Magnesium: 1.7 mg/dL (ref 1.7–2.4)

## 2024-09-28 LAB — PROTIME-INR
INR: 1.2 (ref 0.8–1.2)
Prothrombin Time: 16.4 s — ABNORMAL HIGH (ref 11.4–15.2)

## 2024-09-28 MED ORDER — SODIUM BICARBONATE 8.4 % IV SOLN
INTRAVENOUS | Status: AC
  Filled 2024-09-28: qty 50

## 2024-09-28 MED ORDER — DEXTROSE 50 % IV SOLN
12.5000 g | INTRAVENOUS | Status: AC
  Administered 2024-09-28: 12.5 g via INTRAVENOUS

## 2024-09-28 MED ORDER — MIDAZOLAM HCL (PF) 2 MG/2ML IJ SOLN
INTRAMUSCULAR | Status: AC | PRN
  Administered 2024-09-28: 1 mg via INTRAVENOUS

## 2024-09-28 MED ORDER — MIDAZOLAM HCL 2 MG/2ML IJ SOLN
INTRAMUSCULAR | Status: AC
  Filled 2024-09-28: qty 2

## 2024-09-28 MED ORDER — FENTANYL CITRATE (PF) 100 MCG/2ML IJ SOLN
INTRAMUSCULAR | Status: AC
  Filled 2024-09-28: qty 2

## 2024-09-28 MED ORDER — CEFAZOLIN SODIUM-DEXTROSE 2-4 GM/100ML-% IV SOLN
2.0000 g | Freq: Once | INTRAVENOUS | Status: AC
  Administered 2024-09-28: 2 g via INTRAVENOUS

## 2024-09-28 MED ORDER — LIDOCAINE HCL 1 % IJ SOLN
INTRAMUSCULAR | Status: AC
Start: 2024-09-28 — End: 2024-09-28
  Filled 2024-09-28: qty 20

## 2024-09-28 MED ORDER — GLUCAGON HCL RDNA (DIAGNOSTIC) 1 MG IJ SOLR
INTRAMUSCULAR | Status: AC
  Filled 2024-09-28: qty 1

## 2024-09-28 MED ORDER — DEXTROSE 50 % IV SOLN
INTRAVENOUS | Status: AC
  Filled 2024-09-28: qty 50

## 2024-09-28 MED ORDER — STERILE WATER FOR INJECTION IJ SOLN
INTRAMUSCULAR | Status: AC
  Filled 2024-09-28: qty 10

## 2024-09-28 MED ORDER — LIDOCAINE VISCOUS HCL 2 % MT SOLN
OROMUCOSAL | Status: AC
  Filled 2024-09-28: qty 15

## 2024-09-28 MED ORDER — DEXTROSE-SODIUM CHLORIDE 5-0.9 % IV SOLN: INTRAVENOUS | Status: DC

## 2024-09-28 MED ORDER — SODIUM PHOSPHATES 45 MMOLE/15ML IV SOLN
30.0000 mmol | Freq: Once | INTRAVENOUS | Status: AC
  Administered 2024-09-28: 30 mmol via INTRAVENOUS
  Filled 2024-09-28: qty 10

## 2024-09-28 MED ORDER — MAGNESIUM SULFATE 2 GM/50ML IV SOLN
2.0000 g | Freq: Once | INTRAVENOUS | Status: AC
  Administered 2024-09-28: 2 g via INTRAVENOUS
  Filled 2024-09-28: qty 50

## 2024-09-28 MED ORDER — SODIUM BICARBONATE 8.4 % IV SOLN
100.0000 meq | Freq: Once | INTRAVENOUS | Status: DC
  Filled 2024-09-28: qty 50

## 2024-09-28 NOTE — Progress Notes (Signed)
 Progress Note   Patient: Shelby [name] [name] DOB: [birth year] DOA: 09/15/2024     13 DOS: the patient was seen and examined on 09/28/2024    Brief hospital course:   Shelby [name] is a 83 y.o. female with medical history significant of Hyperparathyroidism, dementia, heart failure with reduced ejection fraction, anemia of chronic disease, dilated cardiomyopathy, type 2 diabetes, essential hypertension, hyperlipidemia, obstructive sleep apnea, peripheral neuropathy, chronic kidney disease stage III, history of seizure-like activity and urinary incontinence who was brought in from [facility] by family wanting her to be checked.  Patient was recently diagnosed with UTI pneumonia and CHF exacerbation.  She has been taking medications at the facility.  Family visited and noticed she is more confused.  Patient is currently unable to give history.  She also altered.  She responds to voice by opening her eyes.  Workup performed showed significant change from last month.  Her sodium is 150, calcium  14.2 and phosphorus only 1.  Patient also has known history of chronic kidney disease creatinine is 1.87.  Head CT without contrast showed no acute findings.  Nephrology consulted and recommends admission for treatment of hypercalcemia and hyponatremia.      Assessment and Plan:   # Acute metabolic encephalopathy: Most likely multifactorial.  Hypercalcemia and hypernatremia. UA negative  Continue supportive care, delirium precautions Fall precautions Monitor neuro function   # Hypercalcemia: Resolved  Due to known history of hyperparathyroidism Continue to monitor on telemetry Continue Sensipar, patient was on it due to known history of hyperparathyroidism Patient received IV fluid  Nephrology consulted, s/p pamidronate  Monitor calcium  level   # Hypernatremia: Resolved Continue to monitor   # Hypomagnesemia, mag repleted. # Hypokalemia, potassium repleted.   Resolved # Hypophosphatemia secondary to nutritional deficiency. Phos repleted. Check electrolytes daily   # Type 2 diabetes:  # Hypoglycemia due to decreased oral intake. Follow hypoglycemia protocol On SSI    # Essential hypertension: Off medications at this time Continue to monitor BP and titrate medications accordingly    # Acute on chronic kidney disease stage II:  sCr 0.86 and eGFR >60 on 09/27/2024 Continue to monitor Bladder scan negative for urinary retention US  Renal: Multiple bilateral renal lesions, as described above. No hydronephrosis. Metabolic acidosis: Bicarb 100 mEq IV x 1 dose give on 10/22   # Hyperlipidemia: Will resume statin when stable.    # Dilated cardiomyopathy: Appears compensated.  Hold off medications for now, resume when patient is stable.   # Anorexia, poor appetite and frequent episodes of hypoglycemia Follow hypoglycemia protocol Encourage oral diet, continue ensures Continue Megace to stimulate appetite Continue assistance with feeding 10/20 d/w patient and her son for PEG tube insertion.  Patient has significant dementia, could not make a decision so patient's son agreed with the PEG tube insertion. 10/22 IR was consulted and patient was taken for the procedure but TOC informed that her insurance will not cover nutrition and supplies so procedure was consulted. TOC informed patient's son that insurance will not cover the nutrition and supplies so he will need out-of-pocket cost of around $400-$500 per month and he did not reply how patient will afford the cost. Continue IV fluid for hydration and to prevent hypoglycemia    # Pyuria, urine culture growing multiple species. S/p empiric Augmentin BID x 3 days.     # Dementia: Currently obtunded. # obstructive sleep apnea: Consider CPAP at night.  # Obesity class I Body mass index is 30.27 kg/m.  Diet: Dysphagia 3 diet DVT Prophylaxis: Subcutaneous Heparin      Advance goals of care  discussion: Full code   Family Communication: family was present at bedside, at the time of interview.    Disposition:  Pt is from SNF, admitted with AME due to hypercalcemia, and noticed hypernatremia, which improved with IV hydration.  Currently patient is stable to discharge. Discharge to SNF, when bed will be available.   10/15 as per TOC patient [facility] to [facility] a.m. 10/16 as per [facility] patient has 7 days left for SNF and requires upfront payment which patient's son said he cannot afford so plan is to send her home with home health.  Hospital bed and [facility] lift order placed. 10/22 PEG tube procedure was canceled today due to no coverage of supplies by insurance as per [facility] Will continue to discuss goals of care with patient's son and disposition plan      Subjective:  No significant events overnight.  Patient was laying comfortably.  Denied any active issues.    Physical Exam: General: NAD, lying comfortably Appear in no distress, affect appropriate Eyes: PERRLA ENT: Oral Mucosa Clear, moist  Neck: no JVD,  Cardiovascular: S1 and S2 Present, no Murmur,  Respiratory: good respiratory effort, Bilateral Air entry equal and Decreased, no Crackles, no wheezes Abdomen: Bowel Sound present, Soft and no tenderness,  Skin: no rashes Extremities: no Pedal edema, no calf tenderness Neurologic: without any new focal findings Gait not checked due to patient safety concerns   Data Reviewed:   Vitals:   09/28/24 1513 09/28/24 1515 09/28/24 1530 09/28/24 1545  BP: 131/82 129/75 125/89 139/67  Pulse: (!) 0 70 69 67  Resp:  17 20 16   Temp:  (!) 97.4 F (36.3 C)  (!) 97.5 F (36.4 C)  TempSrc:  Temporal  Temporal  SpO2:  98% 98% 98%  Weight:      Height:          Latest Ref Rng & Units 09/28/2024   12:44 PM 09/28/2024    3:40 AM 09/27/2024    7:19 AM  BMP  Glucose 70 - 99 mg/dL 80  85  895   BUN 8 - 23 mg/dL 6  6  8    Creatinine 0.44 - 1.00 mg/dL 9.07  9.13   8.98   Sodium 135 - 145 mmol/L 139  142  144   Potassium 3.5 - 5.1 mmol/L 4.6  5.4  4.5   Chloride 98 - 111 mmol/L 114  118  113   CO2 22 - 32 mmol/L 18  18  20    Calcium  8.9 - 10.3 mg/dL 8.7  8.4  8.6        Latest Ref Rng & Units 09/28/2024    3:40 AM 09/27/2024    7:19 AM 09/26/2024    5:01 AM  CBC  WBC 4.0 - 10.5 K/uL 5.4  5.4  6.1   Hemoglobin 12.0 - 15.0 g/dL 88.5  88.7  87.8   Hematocrit 36.0 - 46.0 % 35.2  35.0  37.1   Platelets 150 - 400 K/uL 225  233  235     Total time spent: 55 minutes  Author: Elvan Sor, MD 09/28/2024 5:27 PM  For on call review [email or web address].

## 2024-09-28 NOTE — TOC Progression Note (Addendum)
 Transition of Care [facility]) - Progression Note    Patient Details  Name: Shelby [name] MRN: [id number] Date of Birth: 1941/03/17  [facility] CM/SW Contact  [name] Fuse, RN Phone Number: 09/28/2024, 2:55 PM  Clinical Narrative:     TOC received call from Holley Herring advising the payor will not cover the tube feedings. She doesn't have a documented obstruction or physical abnormality of the GI track that prevents her from eating. TOC sent message to the MD to verify if their is a physical abnormality of the GI track that prevents the patient from eating. There are no phyical abnormalities of the GI track noted.   TOC clarified the name of the tube feedings with the RD Augustin. The plan is for Osmolite (1.5 cartons a day). The out of pocket is $13.15 per day or roughly $395 a month. She also recommended looking into the cost of ensure.   TOC spoke with Mitch to [employer] to see if they can accept the patient.  Per the enteral manager with [employer], if the patient only has failure to drive (anorexia) insurance wont cover it.  TOC spoke with Pam and she is going to find the contact info for another company that a colleague uses for neonates.   16:00 TOC spoke with the patient's son to inform him that we just learned insurance will not cover the cost of the feedings. I advised him that the estimated out of pocket cost is $13.15 per day or roughly $400 dollars a day. He did not share his thoughts about whether or not the patient can afford to pay out of pocket.     Barriers to Discharge: Equipment Delay               Expected Discharge Plan and Services                                               Social Drivers of Health (SDOH) Interventions SDOH Screenings   Food Insecurity: No Food Insecurity (08/18/2024)  Housing: Unknown (08/18/2024)  Transportation Needs: No Transportation Needs (08/18/2024)  Utilities: Not At Risk (08/18/2024)  Alcohol Screen:  Low Risk  (12/20/2018)  Financial Resource Strain: Low Risk  (03/01/2024)   Received from [facility]  Physical Activity: Inactive (03/19/2021)   Received from [facility]  Social Connections: Socially Isolated (08/18/2024)  Stress: No Stress Concern Present (03/19/2021)   Received from [facility]  Tobacco Use: Low Risk  (09/15/2024)    Readmission Risk Interventions     No data to display

## 2024-09-28 NOTE — Progress Notes (Signed)
 Interventional Radiology Brief Note:  Shelby [name] is an 83 year old female with history of poor PO intake.  Gastrostomy tube was planned and patient was prepping for procedure when word received that placement was now on hold due to ongoing coordination of supplies and insurance coverage.    Procedure was terminated and patient returned to recovery.   During the initial stages of procedure prep, patient with significant agitation and poor cooperation despite administration of IV versed.  If to proceed with placement at a later time, Dr. Jenna recommends the following:  -DHT placed in unit with administration of barium night prior to procedure due to high-riding colon seen under fluoro today.  -Ativan  given 30 minutes prior to arrival to IR to maximize sedation effort.   Please re-consult IR if G-tube warranted in the future.   Shelby Mcquaid, MS RD PA-C

## 2024-09-28 NOTE — Progress Notes (Signed)
 Patient refused vital signs

## 2024-09-28 NOTE — Progress Notes (Signed)
 OT Cancellation Note  Patient Details Name: Shelby [name] MRN: [id number] DOB: 01-19-1941   Cancelled Treatment:    Reason Eval/Treat Not Completed: Patient at procedure or test/ unavailable. On arrival transport arriving to take pt to procedure, will hold and reattempt as pt available.   Elston Slot, M.S. OTR/L  09/28/24, 2:19 PM  ascom [phone]

## 2024-09-28 NOTE — Plan of Care (Signed)

## 2024-09-28 NOTE — Sedation Documentation (Signed)
 MD received a phone call from primary MD re: lack of insurance coverage for tube feedings. Procedure cancelled at this point. Dr. Jenna agreeable to stop procedure at this point.

## 2024-09-29 LAB — CBC
HCT: 36.4 % (ref 36.0–46.0)
Hemoglobin: 11.7 g/dL — ABNORMAL LOW (ref 12.0–15.0)
MCH: 30.9 pg (ref 26.0–34.0)
MCHC: 32.1 g/dL (ref 30.0–36.0)
MCV: 96 fL (ref 80.0–100.0)
Platelets: 189 K/uL (ref 150–400)
RBC: 3.79 MIL/uL — ABNORMAL LOW (ref 3.87–5.11)
RDW: 17.1 % — ABNORMAL HIGH (ref 11.5–15.5)
WBC: 5.7 K/uL (ref 4.0–10.5)
nRBC: 0 % (ref 0.0–0.2)

## 2024-09-29 LAB — BASIC METABOLIC PANEL WITH GFR
Anion gap: 9 (ref 5–15)
BUN: 6 mg/dL — ABNORMAL LOW (ref 8–23)
CO2: 20 mmol/L — ABNORMAL LOW (ref 22–32)
Calcium: 8.5 mg/dL — ABNORMAL LOW (ref 8.9–10.3)
Chloride: 113 mmol/L — ABNORMAL HIGH (ref 98–111)
Creatinine, Ser: 0.74 mg/dL (ref 0.44–1.00)
GFR, Estimated: >60 mL/min (ref >60)
Glucose, Bld: 87 mg/dL (ref 70–99)
Potassium: 4.3 mmol/L (ref 3.5–5.1)
Sodium: 142 mmol/L (ref 135–145)

## 2024-09-29 LAB — MAGNESIUM: Magnesium: 1.8 mg/dL (ref 1.7–2.4)

## 2024-09-29 LAB — GLUCOSE, CAPILLARY
Glucose-Capillary: 79 mg/dL (ref 70–99)
Glucose-Capillary: 89 mg/dL (ref 70–99)
Glucose-Capillary: 72 mg/dL (ref 70–99)
Glucose-Capillary: 75 mg/dL (ref 70–99)

## 2024-09-29 LAB — PHOSPHORUS: Phosphorus: 4.2 mg/dL (ref 2.5–4.6)

## 2024-09-29 MED ORDER — DEXTROSE-SODIUM CHLORIDE 5-0.9 % IV SOLN: INTRAVENOUS | Status: DC

## 2024-09-29 MED ORDER — ADULT MULTIVITAMIN W/MINERALS CH
1.0000 | ORAL_TABLET | Freq: Every day | ORAL | Status: DC
  Administered 2024-09-29 – 2024-10-04 (×5): 1 via ORAL
  Filled 2024-09-29 (×5): qty 1

## 2024-09-29 MED ORDER — ZINC SULFATE 220 (50 ZN) MG PO CAPS
220.0000 mg | ORAL_CAPSULE | Freq: Every day | ORAL | Status: DC
Start: 2024-09-29 — End: 2024-10-04
  Administered 2024-09-29 – 2024-10-04 (×5): 220 mg via ORAL
  Filled 2024-09-29 (×5): qty 1

## 2024-09-29 MED ORDER — VITAMIN C 500 MG PO TABS
500.0000 mg | ORAL_TABLET | Freq: Two times a day (BID) | ORAL | Status: DC
  Administered 2024-09-29 – 2024-10-04 (×10): 500 mg via ORAL
  Filled 2024-09-29 (×10): qty 1

## 2024-09-29 MED ORDER — ENSURE PLUS HIGH PROTEIN PO LIQD
237.0000 mL | Freq: Three times a day (TID) | ORAL | Status: DC
  Administered 2024-10-01 – 2024-10-04 (×7): 237 mL via ORAL

## 2024-09-29 MED ORDER — BARIUM SULFATE 2 % PO SUSP: 450.0000 mL | Freq: Once | ORAL | Status: DC

## 2024-09-29 MED ORDER — SODIUM BICARBONATE 650 MG PO TABS
650.0000 mg | ORAL_TABLET | Freq: Three times a day (TID) | ORAL | Status: AC
Start: 2024-09-29 — End: 2024-10-01
  Administered 2024-09-29 – 2024-09-30 (×5): 650 mg via ORAL
  Filled 2024-09-29 (×5): qty 1

## 2024-09-29 MED ORDER — LORAZEPAM 2 MG/ML IJ SOLN: 2.0000 mg | INTRAMUSCULAR | Status: DC | PRN

## 2024-09-29 NOTE — Plan of Care (Signed)

## 2024-09-29 NOTE — Progress Notes (Addendum)
 Nutrition Follow-up  DOCUMENTATION CODES:   Severe malnutrition in context of acute illness/injury  INTERVENTION:   -D/c Ensure Max -Ensure Plus High Protein po TID, each supplement provides 350 kcal and 20 grams of protein  -Magic cup TID with meals, each supplement provides 290 kcal and 9 grams of protein  -MVI with minerals daily -500 mg vitamin C  BID -220 mg zinc sulfate daily x 14 days -RD will draw labs to assess for potential micronutrient deficiencies which may impede wound healing: vitamin A   -Once G-tube is in place and ready for use, recommend:   Osmolite 1.5- Give one carton ( ) 5 times daily via tube. Flush with 50ml of water before and after each feeding. (Will begin with 1/2 of a carton and advance as tolerated)    Regimen provides 1775kcal/day, 75g/day protein and 1405ml/day of free water,    Thiamine 100mg  daily via tube x 7 days    Pt at high refeed risk; recommend monitor potassium, magnesium  and phosphorus labs daily until stable   Juven Fruit Punch BID via tube, each serving provides 95kcal and 2.5g of protein (amino acids glutamine and arginine)  -Once feedings are started, start 100 mg thiamine daily x 7 days due to high refeeding risk   NUTRITION DIAGNOSIS:   Severe Malnutrition related to acute illness as evidenced by severe fat depletion, severe muscle depletion, percent weight loss.  Ongoing  GOAL:   Patient will meet greater than or equal to 90% of their needs  Unmet  MONITOR:   PO intake, Supplement acceptance, Labs, Weight trends, TF tolerance, I & O's, Skin  REASON FOR ASSESSMENT:   Consult Enteral/tube feeding initiation and management  ASSESSMENT:   83 y/o female with h/o dilated cardiomyopathy, HLD, OSA, CHF, DM, peripheral neuropathy, HTN, CKD III, dementia, kidney stones, IDA, poiter, pacreatic cyst, ankle fracture and recent UTI and pneumonia and who is now admitted with AMS, hypercalemia and FTT.  10/22- PEG tube  placement cancelled 10/23- advanced to dysphagia 3 diet  Reviewed I/O's: -400 ml x 24 hours and +2 L since admission  UOP: 400 ml x 24 hours  Patient receiving personal care at time of visit.   Per [facility] notes, patient has a stage 2 pressure injury on sacrum that was present on admission, which has declined to unstageable. She also has full thickness wounds on buttocks related to MASD.   Case discussed with RN, MD, and TOC. PEG tube placement was cancelled yesterday. Per TOC and discussion with [employer] representative ([employer]) insurance will not cover cost of TF formula and supplies (which is about $400 per month). Son has been informed, but has not indicated how he would like to proceed at this time.   She has been resumed on a dysphagia 3 diet. She still has poor oral intake. Noted meal completions 0-20%. She is refusing Ensure Max supplements. Given poor oral intake and risk of hypoglycemia, she would greatly benefit from a nutrient dense supplement.   ADDENDUM: Per discussion with MD, patient's son is aware that insurance will not cover PEG placement and cost of feedings/ supplies, but is willing to proceed and pay out of pocket.   Reviewed weight history; patient has experienced a 16.5% since admission (13 days) which is significant for time frame.   Medications reviewed and include magnesium  oxide, megace, and sodium bicarbonate.   Labs reviewed: CBGS: 72-94 (inpatient orders for glycemic control are 0-15 units insulin  aspart TID with meals and 0-5 units insulin  aspart daily  at bedtime).    Diet Order:   Diet Order             DIET DYS 3 Room service appropriate? Yes; Fluid consistency: Thin  Diet effective now                   EDUCATION NEEDS:   Education needs have been addressed  Skin:  Skin Assessment: Skin Integrity Issues: Skin Integrity Issues:: Unstageable, Other (Comment) Unstageable: sacrum Other: full thickness wounds on buttocks related to  MASD  Last BM:  09/28/24 (type 7)  Height:   Ht Readings from Last 1 Encounters:  09/20/24 5' (1.524 m)    Weight:   Wt Readings from Last 1 Encounters:  09/28/24 70.3 kg    Ideal Body Weight:  45.45 kg  BMI:  Body mass index is 30.27 kg/m.  Estimated Nutritional Needs:   Kcal:  1600-1800kcal/day  Protein:  80-90g/day  Fluid:  1.4-1.6L/day    Margery ORN, RD, LDN, CDCES Registered Dietitian III Certified Diabetes Care and Education Specialist If unable to reach this RD, please use RD Inpatient group chat on secure chat between hours of 8am-4 pm daily

## 2024-09-29 NOTE — Progress Notes (Signed)
 PT Cancellation Note  Patient Details Name: Shelby [name] MRN: [id number] DOB: 1941/08/25   Cancelled Treatment:    Reason Eval/Treat Not Completed: Patient declined to participate with PT services this date despite encouragement secondary to fatigue.  Will attempt to see pt at a future date/time as medically appropriate.     CHARM Glendia Bertin PT, DPT 09/29/24, 2:29 PM

## 2024-09-29 NOTE — Progress Notes (Signed)
 IR alerted that patient's son willing to proceed with G tube placement. Per previous note patient will need NG tube placed and barium given the night prior to placement.   Plan: - Order in for NG tube placement tonight - Give 1 cup thin barium via NG tonight (10/23) at 8 pm; order placed for Readi-Cat to be sent to floor - Hold tube feeds/NPO at midnight on 10/24 - Will need Ativan  given 30 minutes prior to procedure to maximize sedation efforts given agitation on previous attempt - IR will call floor when preparing to send for patient  Please call/Epic chat with questions.  Clotilda Hesselbach, PA-C

## 2024-09-29 NOTE — Progress Notes (Signed)
 Progress Note   Patient: Shelby [name] [name] DOB: January 31, 1941 DOA: 09/15/2024     14 DOS: the patient was seen and examined on 09/29/2024    Brief hospital course:   Shelby [name] is a 83 y.o. female with medical history significant of Hyperparathyroidism, dementia, heart failure with reduced ejection fraction, anemia of chronic disease, dilated cardiomyopathy, type 2 diabetes, essential hypertension, hyperlipidemia, obstructive sleep apnea, peripheral neuropathy, chronic kidney disease stage III, history of seizure-like activity and urinary incontinence who was brought in from [facility] by family wanting her to be checked.  Patient was recently diagnosed with UTI pneumonia and CHF exacerbation.  She has been taking medications at the facility.  Family visited and noticed she is more confused.  Patient is currently unable to give history.  She also altered.  She responds to voice by opening her eyes.  Workup performed showed significant change from last month.  Her sodium is 150, calcium  14.2 and phosphorus only 1.  Patient also has known history of chronic kidney disease creatinine is 1.87.  Head CT without contrast showed no acute findings.  Nephrology consulted and recommends admission for treatment of hypercalcemia and hyponatremia.      Assessment and Plan:   # Acute metabolic encephalopathy: Most likely multifactorial.  Hypercalcemia and hypernatremia. UA negative  Continue supportive care, delirium precautions Fall precautions Monitor neuro function   # Hypercalcemia: Resolved  Due to known history of hyperparathyroidism Continue to monitor on telemetry Continue Sensipar, patient was on it due to known history of hyperparathyroidism Patient received IV fluid  Nephrology consulted, s/p pamidronate  Monitor calcium  level   # Hypernatremia: Resolved Continue to monitor   # Hypomagnesemia, mag repleted. # Hypokalemia, potassium repleted.   Resolved # Hypophosphatemia secondary to nutritional deficiency. Phos repleted. Check electrolytes daily   # Type 2 diabetes:  # Hypoglycemia due to decreased oral intake. Follow hypoglycemia protocol On SSI    # Essential hypertension: Off medications at this time Continue to monitor BP and titrate medications accordingly    # Acute on chronic kidney disease stage II:  sCr 0.86 and eGFR >60 on 09/27/2024 Continue to monitor Bladder scan negative for urinary retention US  Renal: Multiple bilateral renal lesions, as described above. No hydronephrosis. Metabolic acidosis: 10/22 patient refused IV bicarb push as per RN. 10/23, started oral bicarbonate   # Hyperlipidemia: Will resume statin when stable.    # Dilated cardiomyopathy: Appears compensated.  Hold off medications for now, resume when patient is stable.   # Anorexia, poor appetite and frequent episodes of hypoglycemia Follow hypoglycemia protocol Encourage oral diet, continue ensures Continue Megace to stimulate appetite Continue assistance with feeding 10/20 d/w patient and her son for PEG tube insertion.  Patient has significant dementia, could not make a decision so patient's son agreed with the PEG tube insertion. 10/22 IR was consulted and patient was taken for the procedure but TOC informed that her insurance will not cover nutrition and supplies so procedure was consulted. TOC informed patient's son that insurance will not cover the nutrition and supplies so he will need out-of-pocket cost of around $400-$500 per month and he did not reply how patient will afford the cost. Continue IV fluid for hydration and to prevent hypoglycemia 10/23 d/w patient's son, he agreed for PEG tube insertion and paying out-of-pocket for tube feeds.  IR consulted, patient will be getting PEG tube insertion tomorrow a.m. Insert NG tube with nighttime and keep n.p.o. after midnight   #  Pyuria, urine culture growing multiple  species. S/p empiric Augmentin BID x 3 days.     # Dementia: Currently obtunded. # obstructive sleep apnea: Consider CPAP at night.  # Obesity class I Body mass index is 30.27 kg/m.    Diet: Dysphagia 3 diet DVT Prophylaxis: Subcutaneous Heparin      Advance goals of care discussion: Full code   Family Communication: family was present at bedside, at the time of interview.    Disposition:  Pt is from SNF, admitted with AME due to hypercalcemia, and noticed hypernatremia, which improved with IV hydration.  Currently patient is stable to discharge. Discharge to SNF, when bed will be available.   10/15 as per TOC patient [facility] go to [facility] a.m. 10/16 as per [facility] patient has 7 days left for SNF and requires upfront payment which patient's son said he cannot afford so plan is to send her home with home health.  Hospital bed and [facility] lift order placed. 10/23 d/w patient's son at bedside, he agreed for PEG tube insertion and being out-of-pocket for the cost for PEG tube feeding Patient also agreed. IR consulted for PEG tube insertion tomorrow a.m.    Subjective:  No significant events overnight.  Patient was laying comfortably.  Denied any active issues. Patient agreed for PEG tube insertion tomorrow a.m., patient's son was at bedside.   Physical Exam: General: NAD, lying comfortably Appear in no distress, affect appropriate Eyes: PERRLA ENT: Oral Mucosa Clear, moist  Neck: no JVD,  Cardiovascular: S1 and S2 Present, no Murmur,  Respiratory: good respiratory effort, Bilateral Air entry equal and Decreased, no Crackles, no wheezes Abdomen: Bowel Sound present, Soft and no tenderness,  Skin: no rashes Extremities: no Pedal edema, no calf tenderness Neurologic: without any new focal findings Gait not checked due to patient safety concerns   Data Reviewed:   Vitals:   09/28/24 1545 09/28/24 2021 09/29/24 0500 09/29/24 0747  BP: 139/67 (!) 141/78 (!) 148/76 (!)  140/72  Pulse: 67 (!) 58 66 67  Resp: 16 18 18 17   Temp: (!) 97.5 F (36.4 C)  98.6 F (37 C) 99 F (37.2 C)  TempSrc: Temporal  Oral   SpO2: 98% 98% 99% 98%  Weight:      Height:          Latest Ref Rng & Units 09/29/2024    5:29 AM 09/28/2024   12:44 PM 09/28/2024    3:40 AM  BMP  Glucose 70 - 99 mg/dL 87  80  85   BUN 8 - 23 mg/dL 6  6  6    Creatinine 0.44 - 1.00 mg/dL 9.25  9.07  9.13   Sodium 135 - 145 mmol/L 142  139  142   Potassium 3.5 - 5.1 mmol/L 4.3  4.6  5.4   Chloride 98 - 111 mmol/L 113  114  118   CO2 22 - 32 mmol/L 20  18  18    Calcium  8.9 - 10.3 mg/dL 8.5  8.7  8.4        Latest Ref Rng & Units 09/29/2024    5:29 AM 09/28/2024    3:40 AM 09/27/2024    7:19 AM  CBC  WBC 4.0 - 10.5 K/uL 5.7  5.4  5.4   Hemoglobin 12.0 - 15.0 g/dL 88.2  88.5  88.7   Hematocrit 36.0 - 46.0 % 36.4  35.2  35.0   Platelets 150 - 400 K/uL 189  225  233  Total time spent: 55 minutes  Author: Elvan Sor, MD 09/29/2024 3:27 PM  For on call review [email or web address].

## 2024-09-29 NOTE — Progress Notes (Signed)
 OT Cancellation Note  Patient Details Name: London Nonaka MRN: [id number] DOB: 06-05-1941   Cancelled Treatment:    Reason Eval/Treat Not Completed: Patient declined, no reason specified. Pt declined attempts at EOB or bed level exercise, appears upset with attempts. Will follow and see as able and willing.   Elston Slot, M.S. OTR/L  09/29/24, 3:53 PM  ascom [phone]

## 2024-09-30 ENCOUNTER — Inpatient Hospital Stay: Admitting: Radiology

## 2024-09-30 ENCOUNTER — Inpatient Hospital Stay

## 2024-09-30 LAB — BASIC METABOLIC PANEL WITH GFR
Anion gap: 6 (ref 5–15)
BUN: 7 mg/dL — ABNORMAL LOW (ref 8–23)
CO2: 21 mmol/L — ABNORMAL LOW (ref 22–32)
Calcium: 8.3 mg/dL — ABNORMAL LOW (ref 8.9–10.3)
Chloride: 114 mmol/L — ABNORMAL HIGH (ref 98–111)
Creatinine, Ser: 0.86 mg/dL (ref 0.44–1.00)
GFR, Estimated: >60 mL/min (ref >60)
Glucose, Bld: 90 mg/dL (ref 70–99)
Potassium: 4 mmol/L (ref 3.5–5.1)
Sodium: 141 mmol/L (ref 135–145)

## 2024-09-30 LAB — GLUCOSE, CAPILLARY
Glucose-Capillary: 72 mg/dL (ref 70–99)
Glucose-Capillary: 77 mg/dL (ref 70–99)
Glucose-Capillary: 77 mg/dL (ref 70–99)
Glucose-Capillary: 78 mg/dL (ref 70–99)

## 2024-09-30 LAB — PHOSPHORUS: Phosphorus: 1.6 mg/dL — ABNORMAL LOW (ref 2.5–4.6)

## 2024-09-30 LAB — CBC
HCT: 34.1 % — ABNORMAL LOW (ref 36.0–46.0)
Hemoglobin: 11 g/dL — ABNORMAL LOW (ref 12.0–15.0)
MCH: 30.6 pg (ref 26.0–34.0)
MCHC: 32.3 g/dL (ref 30.0–36.0)
MCV: 95 fL (ref 80.0–100.0)
Platelets: 185 K/uL (ref 150–400)
RBC: 3.59 MIL/uL — ABNORMAL LOW (ref 3.87–5.11)
RDW: 17.2 % — ABNORMAL HIGH (ref 11.5–15.5)
WBC: 5.5 K/uL (ref 4.0–10.5)
nRBC: 0 % (ref 0.0–0.2)

## 2024-09-30 LAB — MAGNESIUM: Magnesium: 1.8 mg/dL (ref 1.7–2.4)

## 2024-09-30 MED ORDER — SODIUM PHOSPHATES 45 MMOLE/15ML IV SOLN
15.0000 mmol | Freq: Once | INTRAVENOUS | Status: AC
  Administered 2024-09-30: 15 mmol via INTRAVENOUS
  Filled 2024-09-30: qty 5

## 2024-09-30 MED ORDER — POTASSIUM PHOSPHATES 15 MMOLE/5ML IV SOLN
15.0000 mmol | Freq: Once | INTRAVENOUS | Status: AC
  Administered 2024-09-30: 15 mmol via INTRAVENOUS
  Filled 2024-09-30: qty 5

## 2024-09-30 MED ORDER — LIDOCAINE HCL 1 % IJ SOLN
INTRAMUSCULAR | Status: AC
  Filled 2024-09-30: qty 20

## 2024-09-30 NOTE — Evaluation (Signed)
 Occupational Therapy Re-evaluation Patient Details Name: Shelby [name] MRN: [id number] DOB: Apr 26, 1941 Today's Date: 09/30/2024   History of present illness Pt is an 83 y/o F admitted on 09/15/24 after being brought in by family with reports of more confusion, recently diagnosed with UTI, PNA, & CHF exacerbation. Pt is being treated for acute metabolic encephalopathy, hypercalcemia. Of note, pt with recent admission (08/2024) for L distal fibula fx, closed nondisplaced fx of R lateral malleolus. PMH: hyperparathyroidism, dementia, heart failure with reduced EF, anemia of chronic disease, dilated cardiomyopathy, DM2, HTN, HLD, OSA, peripheral neuropathy, CKD 3, seizure like activity, urinary incontinence   OT comments  Ms Cimino was seen for OT/PT co-treatment on this date. Upon arrival to room pt in bed, alert and agreeable to tx. Pt requires MIN A exit bed, good sitting balance static sitting 5 min and reaching outside BOS ~1 min functional reaching task. MIN A seated grooming and MAX A don CAM boot. MIN A + RW sit<>stand, poor tolerance. Additional mobility limited by arrival of radiology for imaging. Goals updated to reflect progress, will continue to follow POC. Discharge recommendation remains appropriate.       If plan is discharge home, recommend the following:  Supervision due to cognitive status;Assistance with cooking/housework;Help with stairs or ramp for entrance;A lot of help with walking and/or transfers;A lot of help with bathing/dressing/bathroom   Equipment Recommendations  Hospital bed;Hoyer lift    Recommendations for Other Services      Precautions / Restrictions Precautions Precautions: Fall Recall of Precautions/Restrictions: Impaired Restrictions Weight Bearing Restrictions Per Provider Order: Yes RLE Weight Bearing Per Provider Order: Non weight bearing Other Position/Activity Restrictions: WBAT in CAM boot for transfers only       Mobility Bed  Mobility Overal bed mobility: Needs Assistance Bed Mobility: Supine to Sit, Sit to Supine     Supine to sit: Min assist Sit to supine: Mod assist        Transfers Overall transfer level: Needs assistance Equipment used: Rolling walker (2 wheels) Transfers: Sit to/from Stand Sit to Stand: Min assist, From elevated surface          Lateral/Scoot Transfers: Contact guard assist       Balance Overall balance assessment: Needs assistance Sitting-balance support: Feet supported Sitting balance-Leahy Scale: Good     Standing balance support: Bilateral upper extremity supported Standing balance-Leahy Scale: Fair                             ADL either performed or assessed with clinical judgement   ADL Overall ADL's : Needs assistance/impaired                                       General ADL Comments: MIN A grooming sitting and don gown. MAX A don CAM boot in sitting    Extremity/Trunk Assessment Upper Extremity Assessment Upper Extremity Assessment: Generalized weakness   Lower Extremity Assessment Lower Extremity Assessment: Generalized weakness        Communication Communication Communication: Impaired Factors Affecting Communication: Hearing impaired   Cognition Arousal: Alert Behavior During Therapy: WFL for tasks assessed/performed Cognition: History of cognitive impairments             OT - Cognition Comments: pleasant and follows commands with good awareness this session  Following commands: Impaired Following commands impaired: Follows one step commands with increased time      Cueing   Cueing Techniques: Verbal cues, Tactile cues, Visual cues             Pertinent Vitals/ Pain       Pain Assessment Pain Assessment: PAINAD Breathing: normal Negative Vocalization: none Facial Expression: smiling or inexpressive Body Language: relaxed Consolability: no need to console PAINAD Score:  0   Frequency  Min 2X/week        Progress Toward Goals  OT Goals(current goals can now be found in the care plan section)  Progress towards OT goals: Progressing toward goals  Acute Rehab OT Goals OT Goal Formulation: With patient Time For Goal Achievement: 09/30/24 Potential to Achieve Goals: Fair ADL Goals Pt Will Perform Grooming: sitting;with set-up;with supervision Pt Will Perform Lower Body Dressing: with min assist;sit to/from stand Pt Will Transfer to Toilet: with min assist;stand pivot transfer;bedside commode Pt Will Perform Toileting - Clothing Manipulation and hygiene: sitting/lateral leans;with mod assist  Plan      Co-evaluation    PT/OT/SLP Co-Evaluation/Treatment: Yes Reason for Co-Treatment: To address functional/ADL transfers;For patient/therapist safety PT goals addressed during session: Mobility/safety with mobility;Balance;Proper use of DME;Strengthening/ROM OT goals addressed during session: ADL's and self-care      AM-PAC OT 6 Clicks Daily Activity     Outcome Measure   Help from another person eating meals?: None Help from another person taking care of personal grooming?: A Little Help from another person toileting, which includes using toliet, bedpan, or urinal?: A Lot Help from another person bathing (including washing, rinsing, drying)?: A Lot Help from another person to put on and taking off regular upper body clothing?: A Little Help from another person to put on and taking off regular lower body clothing?: A Lot 6 Click Score: 16    End of Session Equipment Utilized During Treatment: Rolling walker (2 wheels)  OT Visit Diagnosis: Other abnormalities of gait and mobility (R26.89);Muscle weakness (generalized) (M62.81)   Activity Tolerance Patient tolerated treatment well   Patient Left in bed;with call bell/phone within reach;with bed alarm set   Nurse Communication Mobility status        Time: 8647-8587 OT Time Calculation  (min): 20 min  Charges: OT General Charges $OT Visit: 1 Visit OT Evaluation $OT Re-eval: 1 Re-eval OT Treatments $Self Care/Home Management : 8-22 mins  Elston Slot, M.S. OTR/L  09/30/24, 2:42 PM  ascom [phone]

## 2024-09-30 NOTE — Plan of Care (Signed)
  Problem: Pain Managment: Goal: General experience of comfort will improve and/or be controlled Outcome: Progressing   Problem: Safety: Goal: Ability to remain free from injury will improve Outcome: Progressing   Problem: Skin Integrity: Goal: Risk for impaired skin integrity will decrease Outcome: Progressing

## 2024-09-30 NOTE — Progress Notes (Signed)
 PODIATRIC SURGERY: update  Reason for Consult: Update weightbearing recommendations during hospitalization    Shelby [name] is a 83 y.o. female with a PMH significant for DM2 (C/B peripheral neuropathy ), CKD, CHF, HLD, HTN presents to hospital for admission for AMS + weakness (09/15/2024).  Given prolonged hospitalization, podiatry was requested to update weightbearing recommendations from injury sustained in September.  Background provided below.  Patient sustained a right ankle fibular fracture from mechanical fall noted back in September (Onset: 08/08/2024).  She followed up in [place] clinic with Dr. Ashley, who recommended fairly nonweightbearing status on that right lower extremity in cam boot immobilization and conservative treatment given slight displacement (appropriate enough for adequate healing location), and frail surgical candidate status.  She was advised that period of nonweightbearing may be 6-8+ weeks to allow bone to heal.  Pain level: 2/10 ; Pain is characterized as: Mild soreness How are symptoms (i.e. pain/swelling/wound}} currently controlled/ addressed?:  Nonweightbearing, elevation Currently transporting in: Fairly nonweightbearing, transfer only  -primarily in cam boot No other acute pedal complaints at this time.   Of note:  Patient is diabetic Patient is on anticoagulants Patient has history of prior fractures / osteoporosis   Past Medical History:  Diagnosis Date   Anemia    Cardiomyopathy (HCC)    Ejection Fraction 30-35% per ECHO 2016   CHF (congestive heart failure) (HCC)    Diabetes (HCC)    Dyspnea    Dysrhythmia    Hyperlipidemia    Hypertension    Peripheral neuropathy    Peripheral neuropathy    Sleep apnea     @MEDICATIONLIST @  Allergies  Allergen Reactions   Furosemide  Other (See Comments)    Legs swelling     @SURGICALHXCOLLAPSE @   PHYSICAL EXAMINATION:  BP 139/75 (BP Location: Right Arm)   Pulse 67   Temp 98.4 F  (36.9 C)   Resp 18   Ht 5' (1.524 m)   Wt 70.2 kg   SpO2 100%   BMI 30.23 kg/m   Constitutional:  Patient appears of normal development and good nutritional status for stated age, well-groomed. Phonating appropriately.  Psychiatric: Overall Disposition. Alert. AOX3.  Resp: RR WNL ~14 ; Nonlabored breathing on RA.  Ext: Able to move all extremities.   FOCUSED LOWER EXTREMITY EXAMINATION:  Neurological:  Gross protective sensation diminished No focal motor or sensory deficits identified.   Vascular:  Dorsalis Pedis artery on palpation: 1 Posterior Tibial artery on palpation: Nonpalpable Capillary Filling Times: Longish Edema/ warmth: none noted   Dermatological:  Skin Quality: Atrophic Ecchymosis: none noted She is noted to have a stable unstageable pressure injury noted to the posterior aspect of the right heel She has 2 areas of stable eschars to the lateral aspect of the ankle.  She believes this is come from boot irritation, though this does not appear to be acute         Musculoskeletal:  Muscle strength: 5 out of 5 in all 4 quadrants Able to wiggle toes  Able to DF/PF ankle  Global foot - TTP: None noted to the posterior heel, styloid process, navicular tubercle. Ankle - TTP: Mild soreness noted to palpation along the posterior aspect of the fibula -none anteriorly.  No pain to the medial aspect of the ankle.  No pain to the posterior aspect of the ankle -no instability noted with range of motion  Narrative & Impression  CLINICAL DATA:  Right ankle fracture.   EXAM: RIGHT ANKLE - COMPLETE 3+ VIEW  COMPARISON:  08/08/2024   FINDINGS: Mildly displaced distal fibular fracture, slight increased displacement from prior exam. Incomplete peripheral callus formation. No mortise widening. No new fracture. Generalized soft tissue edema persists.   IMPRESSION: Mildly displaced distal fibular fracture, slight increased displacement from prior exam. Incomplete  peripheral callus formation.     Electronically Signed   By: Andrea Gasman M.D.   On: 09/30/2024 18:29     IMAGING: 3 Views taken and reviewed of the right ankle: AP, Lat, Oblique   Soft Tissue Density: WNL  Bone Density: WNL  Fracture: There is a radiolucency noted at the distal aspect of the fibula.  Mild increase in the lateral gutter appearance however spacing at the ankle joint is within normal limits, and no extrusion or significant shifting of the talus.  Mild posterior displacement and shortening of the fibula that has been consistent with imaging in clinic.  Bone callus formation is minimal.  Impression:  Mild displacement of the distal fibular fracture, right ankle -minimal interval healing noted. ? This impression was discussed/ shared with the patient.  ASSESSMENT/ PLAN:  #Fibular fracture, delayed healing, right ankle   Patient is approximately 8 weeks into healing process, with minimal to no noted healing on x-ray imaging.  Clinical impression is improved.  Patient verbalized her discomfort with cam boot wear -believes the boot is heavy, and has more pain while wearing the boot than when  (admittedly) not wearing the boot on transfer/weightbearing activities.  ACTIVITY: - Recommend Prevalon/heel offloading boot while in bed. - Recommend ASO ankle brace (lighter profile than boot but still providing stability) for transfer activity only.  I would still recommend fairly nonweightbearing status with full WB (I.e. not heel WB) in ASO ankle brace for transfers only.   Patient will likely be able to transition out of restrictive activities within the next 2 weeks however this will be determined in outpatient basis, unless patient is still in the hospital within 2 weeks.  To consider bone stim potentially in outpatient basis.   *Of note: I had a long discussion with the patient son, who she lives with.  Patients son is very concerned that he is unable to manage her  nonweightbearing status, or her overall deconditioned status alone and with his living situation/ setting.  Son believes that the patient will not be safe if she is discharged home with him.  He would like to speak with the case manager/social worker regarding his options with his mother.  I voiced that I would be able to communicate this with his care team here at the hospital.     -Podiatry to follow-up in outpatient setting and less patient is still in the hospital within 2 weeks

## 2024-09-30 NOTE — Progress Notes (Signed)
 Nutrition Follow-up  DOCUMENTATION CODES:   Severe malnutrition in context of acute illness/injury  INTERVENTION:   -Once diet is advanced, resume:  -Ensure Plus High Protein po TID, each supplement provides 350 kcal and 20 grams of protein  -Magic cup TID with meals, each supplement provides 290 kcal and 9 grams of protein  -MVI with minerals daily -500 mg vitamin C  BID -220 mg zinc sulfate daily x 14 days -RD will draw labs to assess for potential micronutrient deficiencies which may impede wound healing: vitamin A   -Once G-tube is in place and ready for use, recommend:   Osmolite 1.5- Give one carton ( ) 5 times daily via tube. Flush with 50ml of water before and after each feeding. (Will begin with 1/2 of a carton and advance as tolerated)    Regimen provides 1775kcal/day, 75g/day protein and 1431ml/day of free water,     Pt at high refeed risk; recommend monitor potassium, magnesium  and phosphorus labs daily until stable   -Once feedings are started, start 100 mg thiamine daily x 7 days due to high refeeding risk   -Recommendations for discharge:   250 ml Nutren 1.5 (or formulary equivalent) 5 times daily  50 ml free water flush before and after each feeding administration  Tube feeding regimen provides 1900 kcal (100% of needs), 85 grams of protein, and 955 ml of H2O.  Total free water: 1455 ml daily   NUTRITION DIAGNOSIS:   Severe Malnutrition related to acute illness as evidenced by severe fat depletion, severe muscle depletion, percent weight loss.  Ongoing  GOAL:   Patient will meet greater than or equal to 90% of their needs  Progressing   MONITOR:   PO intake, Supplement acceptance, Labs, Weight trends, TF tolerance, I & O's, Skin  REASON FOR ASSESSMENT:   Consult Enteral/tube feeding initiation and management  ASSESSMENT:   83 y/o female with h/o dilated cardiomyopathy, HLD, OSA, CHF, DM, peripheral neuropathy, HTN, CKD III, dementia, kidney  stones, IDA, poiter, pacreatic cyst, ankle fracture and recent UTI and pneumonia and who is now admitted with AMS, hypercalemia and FTT.  10/22- PEG tube placement cancelled 10/23- advanced to dysphagia 3 diet  Reviewed I/O's: -400 ml x 24 hours and +2 L since admission  UOP: 400 ml x 24 hours   Patient sleeping soundly at time of visit. No family at bedside.   Case discussed with TOC and MD. Son agreeable to proceed with PEG placement and is aware that insurance will not cover the cost of the feeds. She is currently NPO for PEG placement today.   Medications reviewed and include megace, MVI, and sodium bicarbonate.   Labs reviewed: Phos: 1.6 (on IV supplementation), CBGS: 72-94 (inpatient orders for glycemic control are 0-15 units insulin  aspart TID with mealls and 0-5 units insulin  aspart daily at bedtime).    Diet Order:   Diet Order             Diet NPO time specified  Diet effective midnight                   EDUCATION NEEDS:   Education needs have been addressed  Skin:  Skin Assessment: Skin Integrity Issues: Skin Integrity Issues:: Unstageable, Other (Comment) Unstageable: sacrum Other: full thickness wounds on buttocks related to MASD  Last BM:  09/28/24 (type 7)  Height:   Ht Readings from Last 1 Encounters:  09/20/24 5' (1.524 m)    Weight:   Wt Readings from Last 1  Encounters:  09/30/24 70.2 kg    Ideal Body Weight:  45.45 kg  BMI:  Body mass index is 30.23 kg/m.  Estimated Nutritional Needs:   Kcal:  1600-1800kcal/day  Protein:  80-90g/day  Fluid:  1.4-1.6L/day    Margery ORN, RD, LDN, CDCES Registered Dietitian III Certified Diabetes Care and Education Specialist If unable to reach this RD, please use RD Inpatient group chat on secure chat between hours of 8am-4 pm daily

## 2024-09-30 NOTE — Discharge Instructions (Signed)
-  Recommendations for discharge:   250 ml Nutren 1.5 (or formulary equivalent) 5 times daily  50 ml free water flush before and after each feeding administration  Tube feeding regimen provides 1900 kcal (100% of needs), 85 grams of protein, and 955 ml of H2O.  Total free water: 1455 ml daily

## 2024-09-30 NOTE — Progress Notes (Signed)
 Progress Note   Patient: Shelby [name] [name] DOB: May 16, 1941 DOA: 09/15/2024     15 DOS: the patient was seen and examined on 09/30/2024    Brief hospital course:   Shelby [name] is a 83 y.o. female with medical history significant of Hyperparathyroidism, dementia, heart failure with reduced ejection fraction, anemia of chronic disease, dilated cardiomyopathy, type 2 diabetes, essential hypertension, hyperlipidemia, obstructive sleep apnea, peripheral neuropathy, chronic kidney disease stage III, history of seizure-like activity and urinary incontinence who was brought in from [facility] by family wanting her to be checked.  Patient was recently diagnosed with UTI pneumonia and CHF exacerbation.  She has been taking medications at the facility.  Family visited and noticed she is more confused.  Patient is currently unable to give history.  She also altered.  She responds to voice by opening her eyes.  Workup performed showed significant change from last month.  Her sodium is 150, calcium  14.2 and phosphorus only 1.  Patient also has known history of chronic kidney disease creatinine is 1.87.  Head CT without contrast showed no acute findings.  Nephrology consulted and recommends admission for treatment of hypercalcemia and hyponatremia.      Assessment and Plan:   # Acute metabolic encephalopathy: Most likely multifactorial.  Hypercalcemia and hypernatremia. UA negative  Continue supportive care, delirium precautions Fall precautions Monitor neuro function   # Hypercalcemia: Resolved  Due to known history of hyperparathyroidism Continue to monitor on telemetry Continue Sensipar, patient was on it due to known history of hyperparathyroidism Patient received IV fluid  Nephrology consulted, s/p pamidronate  Monitor calcium  level   # Hypernatremia: Resolved Continue to monitor   # Hypomagnesemia, mag repleted. # Hypokalemia, potassium repleted.   Resolved # Hypophosphatemia secondary to nutritional deficiency. Phos repleted. Check electrolytes daily   # Type 2 diabetes:  # Hypoglycemia due to decreased oral intake. Follow hypoglycemia protocol On SSI    # Essential hypertension: Off medications at this time Continue to monitor BP and titrate medications accordingly    # Acute on chronic kidney disease stage II:  sCr 0.86 and eGFR >60 on 09/27/2024 Continue to monitor Bladder scan negative for urinary retention US  Renal: Multiple bilateral renal lesions, as described above. No hydronephrosis. Metabolic acidosis: 10/22 patient refused IV bicarb push as per RN. 10/23, started oral bicarbonate   # Hyperlipidemia: Will resume statin when stable.    # Dilated cardiomyopathy: Appears compensated.  Hold off medications for now, resume when patient is stable.   # Anorexia, poor appetite and frequent episodes of hypoglycemia Follow hypoglycemia protocol Encourage oral diet, continue ensures Continue Megace to stimulate appetite Continue assistance with feeding 10/20 d/w patient and her son for PEG tube insertion.  Patient has significant dementia, could not make a decision so patient's son agreed with the PEG tube insertion. 10/22 IR was consulted and patient was taken for the procedure but TOC informed that her insurance will not cover nutrition and supplies so procedure was consulted. TOC informed patient's son that insurance will not cover the nutrition and supplies so he will need out-of-pocket cost of around $400-$500 per month and he did not reply how patient will afford the cost. Continue IV fluid for hydration and to prevent hypoglycemia 10/23 d/w patient's son, he agreed for PEG tube insertion and paying out-of-pocket for tube feeds.  IR consulted, patient will be getting PEG tube insertion tomorrow a.m. 11/24 patient is on the schedule for PEG tube insertion by IR today.   #  Pyuria, urine culture growing multiple  species. S/p empiric Augmentin BID x 3 days.     # Dementia: Currently obtunded. # obstructive sleep apnea: Consider CPAP at night.  # Obesity class I Body mass index is 30.27 kg/m.    Diet: Dysphagia 3 diet DVT Prophylaxis: Subcutaneous Heparin      Advance goals of care discussion: Full code   Family Communication: family was present at bedside, at the time of interview.    Disposition:  Pt is from SNF, admitted with AME due to hypercalcemia, and noticed hypernatremia, which improved with IV hydration.  Currently patient is stable to discharge. Discharge to SNF, when bed will be available.   10/15 as per TOC patient can go to SN[facility] tomorrow a.m. 10/16 as per [facility] patient has 7 days left for SNF and requires upfront payment which patient's son said he cannot afford so plan is to send her home with home health.  Hospital bed and [facility] lift order placed. 10/23 d/w patient's son at bedside, he agreed for PEG tube insertion and being out-of-pocket for the cost for PEG tube feeding Patient also agreed. 10/24 IR consulted for PEG tube insertion which will be done today    Subjective:  No significant events overnight.  Patient could not tolerate NG tube insertion so she does not want any procedure to be done with NG tube.  Patient was laying comfortably, denied any other active issues.  Discussed with IR, they can do PEG tube insertion without NG tube insertion and patient is on the schedule after 3 PM.    Physical Exam: General: NAD, lying comfortably Appear in no distress, affect appropriate Eyes: PERRLA ENT: Oral Mucosa Clear, moist  Neck: no JVD,  Cardiovascular: S1 and S2 Present, no Murmur,  Respiratory: good respiratory effort, Bilateral Air entry equal and Decreased, no Crackles, no wheezes Abdomen: Bowel Sound present, Soft and no tenderness,  Skin: no rashes Extremities: no Pedal edema, no calf tenderness Neurologic: without any new focal findings Gait not  checked due to patient safety concerns   Data Reviewed:   Vitals:   09/29/24 1937 09/30/24 0425 09/30/24 0500 09/30/24 0849  BP: 127/69 130/80  134/66  Pulse: 72 67  65  Resp: 17 17  16   Temp: 98.4 F (36.9 C) 98.4 F (36.9 C)  98.3 F (36.8 C)  TempSrc:      SpO2: 99% 100%  100%  Weight:   70.2 kg   Height:          Latest Ref Rng & Units 09/30/2024    5:19 AM 09/29/2024    5:29 AM 09/28/2024   12:44 PM  BMP  Glucose 70 - 99 mg/dL 90  87  80   BUN 8 - 23 mg/dL 7  6  6    Creatinine 0.44 - 1.00 mg/dL 9.13  9.25  9.07   Sodium 135 - 145 mmol/L 141  142  139   Potassium 3.5 - 5.1 mmol/L 4.0  4.3  4.6   Chloride 98 - 111 mmol/L 114  113  114   CO2 22 - 32 mmol/L 21  20  18    Calcium  8.9 - 10.3 mg/dL 8.3  8.5  8.7        Latest Ref Rng & Units 09/30/2024    5:19 AM 09/29/2024    5:29 AM 09/28/2024    3:40 AM  CBC  WBC 4.0 - 10.5 K/uL 5.5  5.7  5.4   Hemoglobin 12.0 -  15.0 g/dL 88.9  88.2  88.5   Hematocrit 36.0 - 46.0 % 34.1  36.4  35.2   Platelets 150 - 400 K/uL 185  189  225     Total time spent: 55 minutes  Author: Elvan Sor, MD 09/30/2024 2:46 PM  For on call review [email or web address].

## 2024-09-30 NOTE — Progress Notes (Signed)
 Patient came down to specials today for Gtube placement per team/son request. Upon arrival to specials patient is awake and verbal about procedure that is explained to her. Patient at this time very verbal about not having procedure, stating  I don't care what you all want but I am NOT having this procedure, you are not putting a tube down my nose, no more, I am a grown woman and can decide what I don't want to do, so forget it. Would not allow team to get vitals  pre procedure. I reached out to my IR team/MD you consulted with Hospitalist caring for patient on floor today. They called and spoke with son since patient is very verbal about not wanting a procedure/tube nor anything of that nature. At this time holding off on G tube placement and sending patient back to floor.

## 2024-09-30 NOTE — Progress Notes (Addendum)
 Physical Therapy Re-Evaluation Patient Details Name: Shelby [name] MRN: [id number] DOB: July 21, 1941 Today's Date: 09/30/2024   History of Present Illness Pt is an 83 y/o F admitted on 09/15/24 after being brought in by family with reports of more confusion, recently diagnosed with UTI, PNA, & CHF exacerbation. Pt is being treated for acute metabolic encephalopathy, hypercalcemia. Of note, pt with recent admission (08/2024) for R distal fibula fx, closed nondisplaced fx of R lateral malleolus. PMH: hyperparathyroidism, dementia, heart failure with reduced EF, anemia of chronic disease, dilated cardiomyopathy, DM2, HTN, HLD, OSA, peripheral neuropathy, CKD 3, seizure like activity, urinary incontinence    PT Comments  Pt much more alert and engaged this session and was agreeable to PT/OT co-tx. Dr. Ashley contacted via secure chat before session, informed that pt would have repeat imaging to determine whether RLE NWB status remains appropriate. Pt was received in bed, able to perform bed mobility with min-modA trunk/BLE assist and VC for sequencing. RLE CAM boot donned for OOB mobility. Pt able to maintain sitting at EOB for ~10 minutes during session with varying UE support, able to perform BLE exercises and dynamic sitting balance task with good tolerance. Pt performed 2 STS from EOB, initial STS pt spontaneously pulled on RW to stand, was unable to adhere to NWB precautions. Additional STS with RLE supported to prevent WB, required modAx2 to achieve standing. Pt able to laterally scoot hips up toward [facility] with RLE supported. Pt was left supine in bed at end of session, all needs in reach, X-ray tech in room. Pt has made fair progress toward goals, would benefit from additional skilled PT intervention to continue progressing toward goals and maximize independence with functional mobility and ADLs.     If plan is discharge home, recommend the following:     Can travel by private vehicle         Equipment Recommendations  [place] lift;Hospital bed;Wheelchair cushion (measurements PT);Wheelchair (measurements PT) (if unable to d/c to SNF)    Recommendations for Other Services       Precautions / Restrictions Precautions Precautions: Fall Recall of Precautions/Restrictions: Impaired Precaution/Restrictions Comments: Per previous admission (08/2024), per podiatry note, recommend RLE CAM boot for mobility. Restrictions Weight Bearing Restrictions Per Provider Order: Yes RLE Weight Bearing Per Provider Order: Non weight bearing Other Position/Activity Restrictions: Per podiatry note on 9/5: Advise use of a boot for weight-bearing activities such as transferring to a wheelchair or using the restroom.  - Instruct to rest and elevate the leg to aid in healing.  - Apply a compression wrap to the ankle to provide support and reduce swelling.     Mobility  Bed Mobility Overal bed mobility: Needs Assistance Bed Mobility: Supine to Sit, Sit to Supine     Supine to sit: Min assist, HOB elevated Sit to supine: Mod assist   General bed mobility comments: minA trunk assist to achieve sitting EOB. ModA to assist BLE back to bed    Transfers Overall transfer [name]: Needs assistance Equipment used: Rolling walker (2 wheels) Transfers: Sit to/from Stand Sit to Stand: Min assist, Mod assist, +2 physical assistance, From elevated surface          Lateral/Scoot Transfers: Contact guard assist General transfer comment: initial STS from EOB pt spontaneously pulled on RW to come to standing, did not adhere to NWB precautions. Additional STS with support under RLE to prevent WB, required modAx2 to achieve standing. Pt able to laterally scoot hips toward [facility] with support under RLE  Ambulation/Gait                   Stairs             Wheelchair Mobility     Tilt Bed    Modified Rankin (Stroke Patients Only)       Balance Overall balance assessment: Needs  assistance Sitting-balance support: Feet supported Sitting balance-Leahy Scale: Good Sitting balance - Comments: steady static and dynamic sitting   Standing balance support: Bilateral upper extremity supported, Reliant on assistive device for balance Standing balance-Leahy Scale: Fair Standing balance comment: heavy BUE support on RW                            Communication Communication Communication: Impaired Factors Affecting Communication: Hearing impaired  Cognition Arousal: Alert Behavior During Therapy: WFL for tasks assessed/performed   PT - Cognitive impairments: History of cognitive impairments                       PT - Cognition Comments: alert and engaged throughout session Following commands: Impaired Following commands impaired: Follows one step commands with increased time    Cueing Cueing Techniques: Verbal cues, Tactile cues, Visual cues  Exercises General Exercises - Lower Extremity Long Arc Quad: AROM, Both, 10 reps, Seated Hip Flexion/Marching: AROM, Both, 10 reps, Seated Other Exercises Other Exercises: Pt performed multiple repetitions of seated anterior reaching outside BOS with unilateral and bilateral UEs.    General Comments        Pertinent Vitals/Pain Pain Assessment Pain Assessment: No/denies pain    Home Living Family/patient expects to be discharged to:: Private residence                        Prior Function            PT Goals (current goals can now be found in the care plan section) Progress towards PT goals: Goals updated    Frequency    Min 1X/week      PT Plan      Co-evaluation PT/OT/SLP Co-Evaluation/Treatment: Yes Reason for Co-Treatment: To address functional/ADL transfers;For patient/therapist safety PT goals addressed during session: Mobility/safety with mobility;Balance;Proper use of DME;Strengthening/ROM OT goals addressed during session: ADL's and self-care      AM-PAC PT  6 Clicks Mobility   Outcome Measure  Help needed turning from your back to your side while in a flat bed without using bedrails?: A Little Help needed moving from lying on your back to sitting on the side of a flat bed without using bedrails?: A Lot Help needed moving to and from a bed to a chair (including a wheelchair)?: A Lot Help needed standing up from a chair using your arms (e.g., wheelchair or bedside chair)?: A Lot Help needed to walk in hospital room?: Total Help needed climbing 3-5 steps with a railing? : Total 6 Click Score: 11    End of Session Equipment Utilized During Treatment: Gait belt Activity Tolerance: Patient tolerated treatment well Patient left: in bed;with call bell/phone within reach;with bed alarm set;Other (comment) (Xray tech in room) Nurse Communication: Mobility status PT Visit Diagnosis: Muscle weakness (generalized) (M62.81);Other abnormalities of gait and mobility (R26.89);Difficulty in walking, not elsewhere classified (R26.2)     Time: 8647-8587 PT Time Calculation (min) (ACUTE ONLY): 20 min  Charges:      PT General Charges $$ ACUTE PT VISIT: 1 Visit  Gagandeep Kossman, SPT

## 2024-10-01 DIAGNOSIS — Z515 Encounter for palliative care: Secondary | ICD-10-CM | POA: Diagnosis not present

## 2024-10-01 DIAGNOSIS — Z789 Other specified health status: Secondary | ICD-10-CM | POA: Diagnosis not present

## 2024-10-01 DIAGNOSIS — E43 Unspecified severe protein-calorie malnutrition: Secondary | ICD-10-CM

## 2024-10-01 DIAGNOSIS — Z66 Do not resuscitate: Secondary | ICD-10-CM | POA: Diagnosis not present

## 2024-10-01 LAB — BASIC METABOLIC PANEL WITH GFR
Anion gap: 15 (ref 5–15)
BUN: 6 mg/dL — ABNORMAL LOW (ref 8–23)
CO2: 14 mmol/L — ABNORMAL LOW (ref 22–32)
Calcium: 8.6 mg/dL — ABNORMAL LOW (ref 8.9–10.3)
Chloride: 113 mmol/L — ABNORMAL HIGH (ref 98–111)
Creatinine, Ser: 0.8 mg/dL (ref 0.44–1.00)
GFR, Estimated: >60 mL/min (ref >60)
Glucose, Bld: 68 mg/dL — ABNORMAL LOW (ref 70–99)
Potassium: 4.4 mmol/L (ref 3.5–5.1)
Sodium: 142 mmol/L (ref 135–145)

## 2024-10-01 LAB — PHOSPHORUS: Phosphorus: 2.6 mg/dL (ref 2.5–4.6)

## 2024-10-01 LAB — GLUCOSE, CAPILLARY
Glucose-Capillary: 60 mg/dL — ABNORMAL LOW (ref 70–99)
Glucose-Capillary: 74 mg/dL (ref 70–99)
Glucose-Capillary: 60 mg/dL — ABNORMAL LOW (ref 70–99)
Glucose-Capillary: 91 mg/dL (ref 70–99)
Glucose-Capillary: 86 mg/dL (ref 70–99)

## 2024-10-01 LAB — MAGNESIUM: Magnesium: 1.5 mg/dL — ABNORMAL LOW (ref 1.7–2.4)

## 2024-10-01 MED ORDER — SODIUM BICARBONATE 8.4 % IV SOLN
INTRAVENOUS | Status: DC
  Filled 2024-10-01: qty 1000
  Filled 2024-10-01 (×2): qty 150

## 2024-10-01 MED ORDER — MAGNESIUM SULFATE 2 GM/50ML IV SOLN
2.0000 g | Freq: Once | INTRAVENOUS | Status: AC
  Administered 2024-10-01: 2 g via INTRAVENOUS
  Filled 2024-10-01: qty 50

## 2024-10-01 NOTE — Progress Notes (Signed)
 Progress Note   Patient: Shelby [name] [name] DOB: 05-04-1941 DOA: 09/15/2024     16 DOS: the patient was seen and examined on 10/01/2024    Brief hospital course:   Shelby [name] is a 83 y.o. female with medical history significant of Hyperparathyroidism, dementia, heart failure with reduced ejection fraction, anemia of chronic disease, dilated cardiomyopathy, type 2 diabetes, essential hypertension, hyperlipidemia, obstructive sleep apnea, peripheral neuropathy, chronic kidney disease stage III, history of seizure-like activity and urinary incontinence who was brought in from [employer] [facility] by family wanting her to be checked.  Patient was recently diagnosed with UTI pneumonia and CHF exacerbation.  She has been taking medications at the facility.  Family visited and noticed she is more confused.  Patient is currently unable to give history.  She also altered.  She responds to voice by opening her eyes.  Workup performed showed significant change from last month.  Her sodium is 150, calcium  14.2 and phosphorus only 1.  Patient also has known history of chronic kidney disease creatinine is 1.87.  Head CT without contrast showed no acute findings.  Nephrology consulted and recommends admission for treatment of hypercalcemia and hyponatremia.      Assessment and Plan:   # Acute metabolic encephalopathy: Most likely multifactorial.  Hypercalcemia and hypernatremia. UA negative  Continue supportive care, delirium precautions Fall precautions Monitor neuro function   # Hypercalcemia: Resolved  Due to known history of hyperparathyroidism Continue to monitor on telemetry Continue Sensipar, patient was on it due to known history of hyperparathyroidism Patient received IV fluid  Nephrology consulted, s/p pamidronate  Monitor calcium  level   # Hypernatremia: Resolved Continue to monitor   # Hypomagnesemia, mag repleted. # Hypokalemia, potassium repleted.   Resolved # Hypophosphatemia secondary to nutritional deficiency. Phos repleted. Check electrolytes daily   # Type 2 diabetes:  # Hypoglycemia due to decreased oral intake. Follow hypoglycemia protocol On SSI    # Essential hypertension: Off medications at this time Continue to monitor BP and titrate medications accordingly    # Acute on chronic kidney disease stage II:  sCr 0.86 and eGFR >60 on 09/27/2024 Continue to monitor Bladder scan negative for urinary retention US  Renal: Multiple bilateral renal lesions, as described above. No hydronephrosis. Metabolic acidosis: 10/22 patient refused IV bicarb push as per RN. 10/23, started oral bicarbonate 10/25 pCO2 14, started bicarbonate IV infusion.  # Hyperlipidemia: Will resume statin when stable.    # Dilated cardiomyopathy: Appears compensated.  Hold off medications for now, resume when patient is stable.   # Anorexia, poor appetite and frequent episodes of hypoglycemia Follow hypoglycemia protocol Encourage oral diet, continue ensures Continue Megace to stimulate appetite Continue assistance with feeding 10/20 d/w patient and her son for PEG tube insertion.  Patient has significant dementia, could not make a decision so patient's son agreed with the PEG tube insertion. 10/22 IR was consulted and patient was taken for the procedure but TOC informed that her insurance will not cover nutrition and supplies so procedure was consulted. TOC informed patient's son that insurance will not cover the nutrition and supplies so he will need out-of-pocket cost of around $400-$500 per month and he did not reply how patient will afford the cost. Continue IV fluid for hydration and to prevent hypoglycemia 10/23 d/w patient's son, he agreed for PEG tube insertion and paying out-of-pocket for tube feeds.  IR consulted, patient will be getting PEG tube insertion tomorrow a.m. 11/24 patient is on the schedule for  PEG tube insertion by IR today.    # Pyuria, urine culture growing multiple species. S/p empiric Augmentin BID x 3 days.   # Right ankle fracture in Sep 2025 08/08/24 Rt Ankle Xray:  Oblique fracture of the distal fibula with 2 mm distraction of fracture fragments. No ankle subluxation. Patient was admitted at [facility] on 08/18/2024 and she was sent to rehab.  Patient was seen by podiatry 10/25 podiatry consulted again regarding weightbearing by PT  As per podiatry, patient should be NWB for an additional 2 weeks - if she needs to use her RLE for transfer activities / safety, recommend she do that in an ankle brace because it will be lighter for her. Otherwise she should have her prevalon boot while in bed.     # Dementia: Currently obtunded. # obstructive sleep apnea: Consider CPAP at night.  # Obesity class I Body mass index is 30.27 kg/m.    Diet: Dysphagia 3 diet DVT Prophylaxis: Subcutaneous Heparin      Advance goals of care discussion: Full code   Family Communication: family was present at bedside, at the time of interview.    Disposition:  Pt is from SNF, admitted with AME due to hypercalcemia, and hypernatremia, which resolved but patient has anorexia, developing multiple episodes of hypoglycemia and other electrolyte imbalance which needs to be repleted.  Medically patient is not stable at this time, we will replete electrolytes and DC planning in few days  10/25 follow TOC, patient's son needs help at home, need to arrange home health.    Subjective:  No significant events overnight.  Patient was resting calmly, patient's son was at bedside.  Still patient is not eating much, blood glucose dropped in the morning. Patient denied any specific complaints Patient's son wanted to talk to social worker for help at home. Seen by podiatry, patient is nonweightbearing on the right leg due to recent right ankle fracture.    Physical Exam: General: NAD, lying comfortably Appear in no distress, affect appropriate Eyes:  PERRLA ENT: Oral Mucosa Clear, moist  Neck: no JVD,  Cardiovascular: S1 and S2 Present, no Murmur,  Respiratory: good respiratory effort, Bilateral Air entry equal and Decreased, no Crackles, no wheezes Abdomen: Bowel Sound present, Soft and no tenderness,  Skin: no rashes Extremities: no Pedal edema, no calf tenderness Neurologic: without any new focal findings Gait not checked due to patient safety concerns   Data Reviewed:   Vitals:   09/30/24 1651 09/30/24 2029 10/01/24 0450 10/01/24 0716  BP: 127/70 (!) 140/58 120/73 139/75  Pulse: 77 82 66 67  Resp: 16 18 19 18   Temp: 98.1 F (36.7 C) 98.5 F (36.9 C) 98.4 F (36.9 C) 98.4 F (36.9 C)  TempSrc:      SpO2: 100% 100% 100% 100%  Weight:      Height:          Latest Ref Rng & Units 10/01/2024    7:39 AM 09/30/2024    5:19 AM 09/29/2024    5:29 AM  BMP  Glucose 70 - 99 mg/dL 68  90  87   BUN 8 - 23 mg/dL 6  7  6    Creatinine 0.44 - 1.00 mg/dL 9.19  9.13  9.25   Sodium 135 - 145 mmol/L 142  141  142   Potassium 3.5 - 5.1 mmol/L 4.4  4.0  4.3   Chloride 98 - 111 mmol/L 113  114  113   CO2 22 - 32 mmol/L 14  21  20   Calcium  8.9 - 10.3 mg/dL 8.6  8.3  8.5        Latest Ref Rng & Units 09/30/2024    5:19 AM 09/29/2024    5:29 AM 09/28/2024    3:40 AM  CBC  WBC 4.0 - 10.5 K/uL 5.5  5.7  5.4   Hemoglobin 12.0 - 15.0 g/dL 88.9  88.2  88.5   Hematocrit 36.0 - 46.0 % 34.1  36.4  35.2   Platelets 150 - 400 K/uL 185  189  225     Total time spent: 55 minutes  Author: Elvan Sor, MD 10/01/2024 2:39 PM  For on call review [email or web address].

## 2024-10-01 NOTE — Plan of Care (Signed)
  Problem: Education: Goal: Ability to describe self-care measures that may prevent or decrease complications (Diabetes Survival Skills Education) will improve Outcome: Not Progressing Goal: Individualized Educational Video(s) Outcome: Not Progressing   Problem: Coping: Goal: Ability to adjust to condition or change in health will improve Outcome: Not Progressing   Problem: Fluid Volume: Goal: Ability to maintain a balanced intake and output will improve Outcome: Not Progressing   Problem: Health Behavior/Discharge Planning: Goal: Ability to identify and utilize available resources and services will improve Outcome: Not Progressing Goal: Ability to manage health-related needs will improve Outcome: Not Progressing   Problem: Metabolic: Goal: Ability to maintain appropriate glucose levels will improve Outcome: Not Progressing   Problem: Nutritional: Goal: Maintenance of adequate nutrition will improve Outcome: Not Progressing Goal: Progress toward achieving an optimal weight will improve Outcome: Not Progressing

## 2024-10-01 NOTE — Progress Notes (Signed)
 Nutrition Follow-up  DOCUMENTATION CODES:   Severe malnutrition in context of acute illness/injury  INTERVENTION:   -Continue dysphagia 3 diet -Continue Ensure Plus High Protein po TID, each supplement provides 350 kcal and 20 grams of protein  -Continue Magic cup TID with meals, each supplement provides 290 kcal and 9 grams of protein  -Continue MVI with minerals daily -Continue 500 mg vitamin C  BID -Continue 220 mg zinc sulfate daily x 14 days -RD will draw labs to assess for potential micronutrient deficiencies which may impede wound healing: vitamin A   NUTRITION DIAGNOSIS:   Severe Malnutrition related to acute illness as evidenced by severe fat depletion, severe muscle depletion, percent weight loss.  Ongoing  GOAL:   Patient will meet greater than or equal to 90% of their needs  Progressing  MONITOR:   PO intake, Supplement acceptance, Labs, Weight trends, TF tolerance, I & O's, Skin  REASON FOR ASSESSMENT:   Consult Enteral/tube feeding initiation and management  ASSESSMENT:   83 y/o female with h/o dilated cardiomyopathy, HLD, OSA, CHF, DM, peripheral neuropathy, HTN, CKD III, dementia, kidney stones, IDA, poiter, pacreatic cyst, ankle fracture and recent UTI and pneumonia and who is now admitted with AMS, hypercalemia and FTT.  10/22- PEG tube placement cancelled 10/23- advanced to dysphagia 3 diet 10/25- patient refused PEG placement in IR suite  Reviewed I/O's: -182 ml x 24 hours and +1.9 L since 09/17/24  UOP: 250 ml x 24 hours  Per nursing notes, patient refused g-tube placement yesterday. Son was contacted about this.   Patient on a dysphagia 3 diet. Noted poor oral intake. Meal completions 0%. She is accepting Ensure supplements. Noted hypoglycemic episodes.   Medications reviewed and include magnesium  oxide, and megace.  Labs reviewed: K and Phos WDL, Mg: 1.5 (received IV supplementation), CBGS: 60-78 (inpatient orders for glycemic control are  0-15 units insulin  aspart TID with meals and 0-5 units insulin  aspart daily at bedtime). Vitamin A pending.   Diet Order:   Diet Order             DIET DYS 3 Room service appropriate? Yes; Fluid consistency: Thin  Diet effective now                   EDUCATION NEEDS:   Education needs have been addressed  Skin:  Skin Assessment: Skin Integrity Issues: Skin Integrity Issues:: Unstageable, Other (Comment) Unstageable: sacrum Other: full thickness wounds on buttocks related to MASD  Last BM:  09/30/24 (type 5)  Height:   Ht Readings from Last 1 Encounters:  09/20/24 5' (1.524 m)    Weight:   Wt Readings from Last 1 Encounters:  09/30/24 70.2 kg    Ideal Body Weight:  45.45 kg  BMI:  Body mass index is 30.23 kg/m.  Estimated Nutritional Needs:   Kcal:  1600-1800kcal/day  Protein:  80-90g/day  Fluid:  1.4-1.6L/day    Margery ORN, RD, LDN, CDCES Registered Dietitian III Certified Diabetes Care and Education Specialist If unable to reach this RD, please use RD Inpatient group chat on secure chat between hours of 8am-4 pm daily

## 2024-10-01 NOTE — Progress Notes (Signed)
 Palliative Care Progress Note, Assessment & Plan   Patient Name: Shelby [name]       Date: 10/01/2024 DOB: January 05, 1941  Age: 83 y.o. MRN#: [id number] Attending Physician: Von Bellis, MD Primary Care Physician: Steva Clotilda DEL, NP Admit Date: 09/15/2024  Subjective: Reports feeling pretty good today. Denies pain. Slept well last night. Denies SOB/CP.   HPI: 83 y.o. female  with past medical history significant for dementia, HFrEF, anemia of chronic disease, dilated cardiomyopathy, hyperparathyroidism, DM II, essential hypertension, HLD, OSA, peripheral neuropathy, CKD stage III, history of seizure-like activity and urinary incontinence. Patient presented to ED 09/15/2024 c/o AMS and weakness. Patient family reported in ED that patient was recently treated for UTI, PNA and CHF exacerbation. They requested evaluation due to being more confused than normal.    ED labs significant for sodium 150, CO2 36, glucose 121, BUN 44, creatinine 1.87, calcium  14.2, albumin  3.4 and GFR 27. Phosphorus 1.0. UA negative.    Head CT showed no acute intracranial abnormality with stable atrophy and chronic small vessel disease.    ED vitals 111/88, HR 85, SpO2 92% RA, RR 18, 98.3 F   TRH was consulted for admission and management of acute metabolic encephalopathy, hypercalcemia, hypernatremia and acute on chronic CKD stage IIIb.   Palliative care was consulted for assistance with goals of care conversations.     Summary of counseling/coordination of care: Extensive chart review completed prior to meeting patient including labs, vital signs, imaging, progress notes, orders, and available advanced directive documents from current and previous encounters.      Latest Ref Rng & Units 09/30/2024    5:19 AM  09/29/2024    5:29 AM 09/28/2024    3:40 AM  CBC  WBC 4.0 - 10.5 K/uL 5.5  5.7  5.4   Hemoglobin 12.0 - 15.0 g/dL 88.9  88.2  88.5   Hematocrit 36.0 - 46.0 % 34.1  36.4  35.2   Platelets 150 - 400 K/uL 185  189  225       Latest Ref Rng & Units 10/01/2024    7:39 AM 09/30/2024    5:19 AM 09/29/2024    5:29 AM  CMP  Glucose 70 - 99 mg/dL 68  90  87   BUN 8 - 23 mg/dL 6  7  6    Creatinine 0.44 - 1.00 mg/dL 9.19  9.13  9.25   Sodium 135 - 145 mmol/L 142  141  142   Potassium 3.5 - 5.1 mmol/L 4.4  4.0  4.3   Chloride 98 - 111 mmol/L 113  114  113   CO2 22 - 32 mmol/L 14  21  20    Calcium  8.9 - 10.3 mg/dL 8.6  8.3  8.5     After reviewing the patient's chart I assessed patient at bedside.   Very pleasant, elderly female lying in bed. She is alert and oriented to self, DOB, location and current president. She is unable to correctly state year. She is able to participate in conversation. Respirations are even and unlabored. She is in no distress. No family at bedside.   Patient encouraged to drink her Ensure on bedside table. [name] shares that she  does not want a tube in her stomach. She states she wants to eat and drink her food. Encouraged patient again to drink her Ensure.   Due to diagnosed dementia, patient deemed to not have the capacity to make decisions for herself. Her son, Shelby [name], is her [facility] decision maker and has decided to proceed with G-tube placement for nutritional support. Plan for G-tube next week.    Therapeutic silence and active listening provided for patient to share her thoughts and emotions regarding current medical situation.  Emotional support provided.  Physical Exam Vitals reviewed.  Constitutional:      General: She is not in acute distress.    Appearance: She is not ill-appearing.  HENT:     Head: Normocephalic and atraumatic.     Mouth/Throat:     Mouth: Mucous membranes are moist.     Comments: Missing teeth  Pulmonary:     Effort:  Pulmonary effort is normal. No respiratory distress.  Musculoskeletal:     Right lower leg: No edema.     Left lower leg: No edema.  Skin:    General: Skin is warm and dry.  Neurological:     Mental Status: She is alert. Mental status is at baseline.  Psychiatric:        Mood and Affect: Mood normal.        Behavior: Behavior normal.      Recommendations/Plan: DNR with intervention    Continue current supportive interventions Follow TOC for disposition  PMT will follow peripherally and remain available for needs                    Total Time 50 minutes   Time spent includes: Detailed review of medical records (labs, imaging, vital signs), medically appropriate exam (mental status, respiratory, cardiac, skin), discussed with treatment team, counseling and educating patient, family and staff, documenting clinical information, medication management and coordination of care.     Devere Sacks, AMANDA [facility] Palliative Medicine Team  10/01/2024 9:36 AM  Office [phone]  Pager [phone]

## 2024-10-02 LAB — CBC
HCT: 31.1 % — ABNORMAL LOW (ref 36.0–46.0)
Hemoglobin: 10.3 g/dL — ABNORMAL LOW (ref 12.0–15.0)
MCH: 30.8 pg (ref 26.0–34.0)
MCHC: 33.1 g/dL (ref 30.0–36.0)
MCV: 93.1 fL (ref 80.0–100.0)
Platelets: 165 K/uL (ref 150–400)
RBC: 3.34 MIL/uL — ABNORMAL LOW (ref 3.87–5.11)
RDW: 16.8 % — ABNORMAL HIGH (ref 11.5–15.5)
WBC: 5 K/uL (ref 4.0–10.5)
nRBC: 0 % (ref 0.0–0.2)

## 2024-10-02 LAB — BASIC METABOLIC PANEL WITH GFR
Anion gap: 11 (ref 5–15)
BUN: 6 mg/dL — ABNORMAL LOW (ref 8–23)
CO2: 21 mmol/L — ABNORMAL LOW (ref 22–32)
Calcium: 8.4 mg/dL — ABNORMAL LOW (ref 8.9–10.3)
Chloride: 109 mmol/L (ref 98–111)
Creatinine, Ser: 0.81 mg/dL (ref 0.44–1.00)
GFR, Estimated: >60 mL/min (ref >60)
Glucose, Bld: 88 mg/dL (ref 70–99)
Potassium: 3.7 mmol/L (ref 3.5–5.1)
Sodium: 141 mmol/L (ref 135–145)

## 2024-10-02 LAB — GLUCOSE, CAPILLARY
Glucose-Capillary: 74 mg/dL (ref 70–99)
Glucose-Capillary: 79 mg/dL (ref 70–99)
Glucose-Capillary: 83 mg/dL (ref 70–99)

## 2024-10-02 LAB — PHOSPHORUS: Phosphorus: 1.4 mg/dL — ABNORMAL LOW (ref 2.5–4.6)

## 2024-10-02 LAB — MAGNESIUM: Magnesium: 1.9 mg/dL (ref 1.7–2.4)

## 2024-10-02 MED ORDER — POTASSIUM PHOSPHATES 15 MMOLE/5ML IV SOLN
30.0000 mmol | Freq: Once | INTRAVENOUS | Status: AC
  Administered 2024-10-02: 30 mmol via INTRAVENOUS
  Filled 2024-10-02: qty 10

## 2024-10-02 MED ORDER — ENOXAPARIN SODIUM 40 MG/0.4ML IJ SOSY
40.0000 mg | PREFILLED_SYRINGE | Freq: Every evening | INTRAMUSCULAR | Status: DC
  Administered 2024-10-02: 40 mg via SUBCUTANEOUS
  Filled 2024-10-02 (×2): qty 0.4

## 2024-10-02 NOTE — Progress Notes (Addendum)
 Progress Note   Patient: Shelby [name] [name] DOB: 09-May-1941 DOA: 09/15/2024     17 DOS: the patient was seen and examined on 10/02/2024    Brief hospital course:   Shelby [name] is a 83 y.o. female with medical history significant of Hyperparathyroidism, dementia, heart failure with reduced ejection fraction, anemia of chronic disease, dilated cardiomyopathy, type 2 diabetes, essential hypertension, hyperlipidemia, obstructive sleep apnea, peripheral neuropathy, chronic kidney disease stage III, history of seizure-like activity and urinary incontinence who was brought in from [facility] by family wanting her to be checked.  Patient was recently diagnosed with UTI pneumonia and CHF exacerbation.  She has been taking medications at the facility.  Family visited and noticed she is more confused.  Patient is currently unable to give history.  She also altered.  She responds to voice by opening her eyes.  Workup performed showed significant change from last month.  Her sodium is 150, calcium  14.2 and phosphorus only 1.  Patient also has known history of chronic kidney disease creatinine is 1.87.  Head CT without contrast showed no acute findings.  Nephrology consulted and recommends admission for treatment of hypercalcemia and hyponatremia.      Assessment and Plan:   # Acute metabolic encephalopathy: Most likely multifactorial.  Hypercalcemia and hypernatremia. UA negative  Continue supportive care, delirium precautions Fall precautions Monitor neuro function   # Hypercalcemia: Resolved  Due to known history of hyperparathyroidism Continue to monitor on telemetry Continue Sensipar, patient was on it due to known history of hyperparathyroidism Patient received IV fluid  Nephrology consulted, s/p pamidronate  Monitor calcium  level   # Hypernatremia: Resolved Continue to monitor   # Hypomagnesemia, mag repleted. # Hypokalemia, potassium repleted.   Resolved # Hypophosphatemia secondary to nutritional deficiency. Phos repleted. Check electrolytes daily   # Type 2 diabetes:  # Hypoglycemia due to decreased oral intake. Follow hypoglycemia protocol On SSI    # Essential hypertension: Off medications at this time Continue to monitor BP and titrate medications accordingly    # Acute on chronic kidney disease stage II:  sCr 0.86 and eGFR >60 on 09/27/2024 Continue to monitor Bladder scan negative for urinary retention US  Renal: Multiple bilateral renal lesions, as described above. No hydronephrosis. Metabolic acidosis: 10/22 patient refused IV bicarb push as per RN. 10/23, started oral bicarbonate 10/25 pCO2 14, started bicarbonate IV infusion. 10/26 pCO2 21, improved, discontinued bicarb infusion. Monitor BMP daily   # Hyperlipidemia: Will resume statin when stable.    # Dilated cardiomyopathy: Appears compensated.  Hold off medications for now, resume when patient is stable.   # Anorexia, poor appetite and frequent episodes of hypoglycemia Follow hypoglycemia protocol Encourage oral diet, continue ensures Continue Megace to stimulate appetite Continue assistance with feeding 10/20 d/w patient and her son for PEG tube insertion.  Patient has significant dementia, could not make a decision so patient's son agreed with the PEG tube insertion. 10/22 IR was consulted and patient was taken for the procedure but TOC informed that her insurance will not cover nutrition and supplies so procedure was consulted. TOC informed patient's son that insurance will not cover the nutrition and supplies so he will need out-of-pocket cost of around $400-$500 per month and he did not reply how patient will afford the cost. Continue IV fluid for hydration and to prevent hypoglycemia 10/23 d/w patient's son, he agreed for PEG tube insertion and paying out-of-pocket for tube feeds.  IR consulted, patient will be getting PEG  tube insertion tomorrow  a.m. 11/24 patient is on the schedule for PEG tube insertion by IR today.   # Pyuria, urine culture growing multiple species. S/p empiric Augmentin BID x 3 days.   # Right ankle fracture in Sep 2025 08/08/24 Rt Ankle Xray:  Oblique fracture of the distal fibula with 2 mm distraction of fracture fragments. No ankle subluxation. Patient was admitted at [facility] on 08/18/2024 and she was sent to rehab.  Patient was seen by podiatry 10/25 podiatry consulted again regarding weightbearing by PT  As per podiatry, patient should be NWB for an additional 2 weeks - if she needs to use her RLE for transfer activities / safety, recommend she do that in an ankle brace because it will be lighter for her. Otherwise she should have her prevalon boot while in bed.     # Dementia: Currently obtunded. # obstructive sleep apnea: Consider CPAP at night.  # Obesity class I Body mass index is 30.27 kg/m.    Diet: Dysphagia 3 diet DVT Prophylaxis: Lovenox    Advance goals of care discussion: Full code   Family Communication: family was present at bedside, at the time of interview.    Disposition:  Pt is from SNF, admitted with AME due to hypercalcemia, and hypernatremia, which resolved but patient has anorexia, developing multiple episodes of hypoglycemia and other electrolyte imbalance which needs to be repleted.  Medically patient is not stable at this time, we will replete electrolytes and DC planning in few days  10/26 f/u with [facility] for home health set up, DC plan tomorrow a.m.    Subjective:  No significant events overnight.  Patient was resting calmly, denied any active issues.  Patient was advised to eat more but she stated that she does not eat a lot since beginning.  She is not a big eater. Again encouraged to increase oral intake.     Physical Exam: General: NAD, lying comfortably Appear in no distress, affect appropriate Eyes: PERRLA ENT: Oral Mucosa Clear, moist  Neck: no JVD,  Cardiovascular:  S1 and S2 Present, no Murmur,  Respiratory: good respiratory effort, Bilateral Air entry equal and Decreased, no Crackles, no wheezes Abdomen: Bowel Sound present, Soft and no tenderness,  Skin: no rashes Extremities: no Pedal edema, no calf tenderness Neurologic: without any new focal findings Gait not checked due to patient safety concerns   Data Reviewed:   Vitals:   10/01/24 1834 10/01/24 1945 10/02/24 0453 10/02/24 0500  BP: 117/75 121/62 (!) 113/57   Pulse: 78 65 62   Resp: 18 16 17    Temp: 98.5 F (36.9 C) 98.8 F (37.1 C) 98.8 F (37.1 C)   TempSrc:  Oral Oral   SpO2: 100% 100% 100%   Weight:    74.2 kg  Height:          Latest Ref Rng & Units 10/02/2024    3:51 AM 10/01/2024    7:39 AM 09/30/2024    5:19 AM  BMP  Glucose 70 - 99 mg/dL 88  68  90   BUN 8 - 23 mg/dL 6  6  7    Creatinine 0.44 - 1.00 mg/dL 9.18  9.19  9.13   Sodium 135 - 145 mmol/L 141  142  141   Potassium 3.5 - 5.1 mmol/L 3.7  4.4  4.0   Chloride 98 - 111 mmol/L 109  113  114   CO2 22 - 32 mmol/L 21  14  21    Calcium  8.9 -  10.3 mg/dL 8.4  8.6  8.3        Latest Ref Rng & Units 10/02/2024    3:51 AM 09/30/2024    5:19 AM 09/29/2024    5:29 AM  CBC  WBC 4.0 - 10.5 K/uL 5.0  5.5  5.7   Hemoglobin 12.0 - 15.0 g/dL 89.6  88.9  88.2   Hematocrit 36.0 - 46.0 % 31.1  34.1  36.4   Platelets 150 - 400 K/uL 165  185  189     Total time spent: 55 minutes  Author: Elvan Sor, MD 10/02/2024 2:26 PM  For on call review [email or web address].

## 2024-10-02 NOTE — Progress Notes (Signed)
 Pt refused VS and CBG check this evening. Continues to refuse meals. MD aware.

## 2024-10-02 NOTE — Progress Notes (Signed)
 Pt is refusing heparin  injections. Pt educated but continues to refuse. Dr. Von made aware and changed to Lovenox  once daily.

## 2024-10-02 NOTE — Plan of Care (Signed)
  Problem: Education: Goal: Ability to describe self-care measures that may prevent or decrease complications (Diabetes Survival Skills Education) will improve Outcome: Not Progressing Goal: Individualized Educational Video(s) Outcome: Not Progressing   Problem: Coping: Goal: Ability to adjust to condition or change in health will improve Outcome: Not Progressing   Problem: Fluid Volume: Goal: Ability to maintain a balanced intake and output will improve Outcome: Not Progressing   Problem: Health Behavior/Discharge Planning: Goal: Ability to identify and utilize available resources and services will improve Outcome: Not Progressing Goal: Ability to manage health-related needs will improve Outcome: Not Progressing   Problem: Metabolic: Goal: Ability to maintain appropriate glucose levels will improve Outcome: Not Progressing   Problem: Nutritional: Goal: Maintenance of adequate nutrition will improve Outcome: Not Progressing

## 2024-10-02 NOTE — Progress Notes (Signed)
 Physical Therapy Treatment Patient Details Name: Shelby [name] MRN: [id number] DOB: 1941-08-18 Today's Date: 10/02/2024   History of Present Illness Pt is an 83 y/o F admitted on 09/15/24 after being brought in by family with reports of more confusion, recently diagnosed with UTI, PNA, & CHF exacerbation. Pt is being treated for acute metabolic encephalopathy, hypercalcemia. Of note, pt with recent admission (08/2024) for R distal fibula fx, closed nondisplaced fx of R lateral malleolus. PMH: hyperparathyroidism, dementia, heart failure with reduced EF, anemia of chronic disease, dilated cardiomyopathy, DM2, HTN, HLD, OSA, peripheral neuropathy, CKD 3, seizure like activity, urinary incontinence    PT Comments  Pt in bed, generally disheveled with gown 1/2 off and had taken her arm through neck opening.  Assisted in re-dressing pt.  Pt with clear cognitive deficits.  When asked where she is she clearly reads communication board in her room and stated [place], [place] but when asked other orientation questions she is unable to answer correctly.  She is encouraged to sit EOB and do any LE ex in supine but each time covers are removed she pulls them back over stating she is cold.  Attempted to keep her covered for activity but she again declined.  She is however able to reposition herself from Left to Right side lying and able to pull herself up in the bed with good upwards movement.  Seems generally strong with good bed mobility at this time.  She does show me her R heel and stated it is sore  Prevlon boots in room reapplied.    If plan is discharge home, recommend the following: Two people to help with walking and/or transfers;Two people to help with bathing/dressing/bathroom;Direct supervision/assist for medications management;Assistance with cooking/housework;Direct supervision/assist for financial management;Supervision due to cognitive status;Assistance with feeding   Can travel by  private vehicle        Equipment Recommendations  [place] lift;Hospital bed;Wheelchair cushion (measurements PT);Wheelchair (measurements PT);BSC/3in1    Recommendations for Other Services       Precautions / Restrictions Precautions Precautions: Fall Recall of Precautions/Restrictions: Impaired Precaution/Restrictions Comments: Per previous admission (08/2024), per podiatry note, recommend RLE CAM boot for mobility. Restrictions Weight Bearing Restrictions Per Provider Order: Yes RLE Weight Bearing Per Provider Order: Non weight bearing Other Position/Activity Restrictions: Per podiatry note on 9/5: Advise use of a boot for weight-bearing activities such as transferring to a wheelchair or using the restroom.  - Instruct to rest and elevate the leg to aid in healing.  - Apply a compression wrap to the ankle to provide support and reduce swelling.     Mobility  Bed Mobility Overal bed mobility: Needs Assistance Bed Mobility: Rolling Rolling: Contact guard assist, Used rails         General bed mobility comments: resisted transition to sitting    Transfers                        Ambulation/Gait                   Stairs             Wheelchair Mobility     Tilt Bed    Modified Rankin (Stroke Patients Only)       Balance  Communication Communication Communication: Impaired Factors Affecting Communication: Hearing impaired  Cognition Arousal: Alert Behavior During Therapy: WFL for tasks assessed/performed   PT - Cognitive impairments: History of cognitive impairments                       PT - Cognition Comments: confusion seemed to be primary barrier.  knew she was at [facility] but seemed to read it more from the board that awareness.  Did not know month/date etc.  February and was concerned over people looking in the windows at night even though it is  2:00PM. Following commands: Impaired Following commands impaired: Follows one step commands inconsistently    Cueing Cueing Techniques: Verbal cues, Tactile cues, Visual cues  Exercises Other Exercises Other Exercises: attempted supine ex but she does not participate due to cognition and keeps coving herself up due to being cold    General Comments        Pertinent Vitals/Pain Pain Assessment Pain Assessment: No/denies pain Pain Intervention(s): Monitored during session    Home Living                          Prior Function            PT Goals (current goals can now be found in the care plan section) Progress towards PT goals: Not progressing toward goals - comment    Frequency    Min 1X/week      PT Plan      Co-evaluation              AM-PAC PT 6 Clicks Mobility   Outcome Measure  Help needed turning from your back to your side while in a flat bed without using bedrails?: A Little Help needed moving from lying on your back to sitting on the side of a flat bed without using bedrails?: A Lot Help needed moving to and from a bed to a chair (including a wheelchair)?: Total Help needed standing up from a chair using your arms (e.g., wheelchair or bedside chair)?: Total Help needed to walk in hospital room?: Total Help needed climbing 3-5 steps with a railing? : Total 6 Click Score: 9    End of Session   Activity Tolerance: Other (comment) Patient left: in bed;with call bell/phone within reach;with bed alarm set;Other (comment) Nurse Communication: Mobility status PT Visit Diagnosis: Muscle weakness (generalized) (M62.81);Other abnormalities of gait and mobility (R26.89);Difficulty in walking, not elsewhere classified (R26.2)     Time: 8598-8590 PT Time Calculation (min) (ACUTE ONLY): 8 min  Charges:    $Therapeutic Activity: 8-22 mins PT General Charges $$ ACUTE PT VISIT: 1 Visit                   Lauraine Gills, PTA 10/02/24, 2:40  PM

## 2024-10-02 NOTE — Plan of Care (Signed)
  Problem: Metabolic: Goal: Ability to maintain appropriate glucose levels will improve Outcome: Progressing   Problem: Skin Integrity: Goal: Risk for impaired skin integrity will decrease Outcome: Progressing   Problem: Coping: Goal: Ability to adjust to condition or change in health will improve Outcome: Not Progressing   Problem: Fluid Volume: Goal: Ability to maintain a balanced intake and output will improve Outcome: Not Progressing   Problem: Nutritional: Goal: Maintenance of adequate nutrition will improve Outcome: Not Progressing

## 2024-10-03 LAB — CBC
HCT: 33 % — ABNORMAL LOW (ref 36.0–46.0)
Hemoglobin: 10.7 g/dL — ABNORMAL LOW (ref 12.0–15.0)
MCH: 30.7 pg (ref 26.0–34.0)
MCHC: 32.4 g/dL (ref 30.0–36.0)
MCV: 94.8 fL (ref 80.0–100.0)
Platelets: 169 K/uL (ref 150–400)
RBC: 3.48 MIL/uL — ABNORMAL LOW (ref 3.87–5.11)
RDW: 16.9 % — ABNORMAL HIGH (ref 11.5–15.5)
WBC: 4.7 K/uL (ref 4.0–10.5)
nRBC: 0 % (ref 0.0–0.2)

## 2024-10-03 LAB — GLUCOSE, CAPILLARY
Glucose-Capillary: 66 mg/dL — ABNORMAL LOW (ref 70–99)
Glucose-Capillary: 68 mg/dL — ABNORMAL LOW (ref 70–99)
Glucose-Capillary: 81 mg/dL (ref 70–99)
Glucose-Capillary: 84 mg/dL (ref 70–99)
Glucose-Capillary: 95 mg/dL (ref 70–99)

## 2024-10-03 LAB — MAGNESIUM: Magnesium: 1.9 mg/dL (ref 1.7–2.4)

## 2024-10-03 LAB — BASIC METABOLIC PANEL WITH GFR
Anion gap: 5 (ref 5–15)
BUN: 6 mg/dL — ABNORMAL LOW (ref 8–23)
CO2: 23 mmol/L (ref 22–32)
Calcium: 8.8 mg/dL — ABNORMAL LOW (ref 8.9–10.3)
Chloride: 109 mmol/L (ref 98–111)
Creatinine, Ser: 0.94 mg/dL (ref 0.44–1.00)
GFR, Estimated: >60 mL/min (ref >60)
Glucose, Bld: 88 mg/dL (ref 70–99)
Potassium: 4.1 mmol/L (ref 3.5–5.1)
Sodium: 137 mmol/L (ref 135–145)

## 2024-10-03 LAB — PHOSPHORUS: Phosphorus: 1.8 mg/dL — ABNORMAL LOW (ref 2.5–4.6)

## 2024-10-03 LAB — VITAMIN A: Vitamin A (Retinoic Acid): 18.7 ug/dL — ABNORMAL LOW (ref 22.0–69.5)

## 2024-10-03 MED ORDER — POTASSIUM PHOSPHATES 15 MMOLE/5ML IV SOLN
30.0000 mmol | Freq: Once | INTRAVENOUS | Status: AC
  Administered 2024-10-03: 30 mmol via INTRAVENOUS
  Filled 2024-10-03: qty 10

## 2024-10-03 NOTE — Progress Notes (Signed)
 Brief Interventional Radiology Note:  Received a call from Dr. Von this morning requesting that IR attempt to set up g-tube placement with anesthesia later this week due to patient being traumatized by the initial, incomplete procedure on 10/22. Dr. Von was later speaking with the patient and her son, updating them that we would be able to move forward with procedure under general anesthesia. Unfortunately, patient no longer wished to proceed with the procedure and IR was informed gastrostomy tube placement would no longer be necessary.   IR will delete the order. If the patient and her son change their minds or patient status changes and gastrostomy tube placement is required, please feel free to place a new order for gastrostomy tube placement to IR.   Thank you for allowing our service to participate in [facility] care.   Electronically Signed: Acelynn Dejonge M Tiffeny Minchew, PA-C 10/03/2024, 4:44 PM

## 2024-10-03 NOTE — Consult Note (Addendum)
 WOC Nurse wound follow up Wound type: PI unstageable (prior stage 2 POA). Measurement: 3 cm x 0.8 cm x 0.2 cm Wound bed: 100% beefy red. Drainage (amount, consistency, odor) Moderate amount. Periwound: macerated, rolled edges Dressing procedure/placement/frequency: Cleanse with saline, pat dry. Apply Aquacel [id number] to the wound bed. Top with foam dressing. Ok lift and change the Aquacel, the foam can stay up to 3 days if not saturated or soiling.    WOC team will follow weekly. Please reconsult if further assistance is needed. Thank-you,  Lela Holm RN, CNS, [employer], MSN.  (Phone [phone])

## 2024-10-03 NOTE — TOC Progression Note (Addendum)
 Transition of Care [facility]) - Progression Note    Patient Details  Name: Shelby [name] MRN: [id number] Date of Birth: 11/30/1941  [facility] CM/SW Contact  [name] Fuse, RN Phone Number: 10/03/2024, 10:57 AM  Clinical Narrative:     Patient refused Peg in IR suite on 10/25. RD following for nutrition and revieweing labs for micronutrients that may impede wound healing. Wound nurse also following. Plan for home with [facility] when medically appropriate.    Barriers to Discharge: Equipment Delay               Expected Discharge Plan and Services                                               Social Drivers of Health (SDOH) Interventions SDOH Screenings   Food Insecurity: No Food Insecurity (08/18/2024)  Housing: Unknown (08/18/2024)  Transportation Needs: No Transportation Needs (08/18/2024)  Utilities: Not At Risk (08/18/2024)  Alcohol Screen: Low Risk  (12/20/2018)  Financial Resource Strain: Low Risk  (03/01/2024)   Received from [facility]  Physical Activity: Inactive (03/19/2021)   Received from [facility]  Social Connections: Socially Isolated (08/18/2024)  Stress: No Stress Concern Present (03/19/2021)   Received from [facility]  Tobacco Use: Low Risk  (09/15/2024)    Readmission Risk Interventions     No data to display

## 2024-10-03 NOTE — Progress Notes (Signed)
 Progress Note   Patient: Shelby [name] [name] DOB: 1941-03-16 DOA: 09/15/2024     18 DOS: the patient was seen and examined on 10/03/2024    Brief hospital course:   Shelby [name] is a 83 y.o. female with medical history significant of Hyperparathyroidism, dementia, heart failure with reduced ejection fraction, anemia of chronic disease, dilated cardiomyopathy, type 2 diabetes, essential hypertension, hyperlipidemia, obstructive sleep apnea, peripheral neuropathy, chronic kidney disease stage III, history of seizure-like activity and urinary incontinence who was brought in from [facility] by family wanting her to be checked.  Patient was recently diagnosed with UTI pneumonia and CHF exacerbation.  She has been taking medications at the facility.  Family visited and noticed she is more confused.  Patient is currently unable to give history.  She also altered.  She responds to voice by opening her eyes.  Workup performed showed significant change from last month.  Her sodium is 150, calcium  14.2 and phosphorus only 1.  Patient also has known history of chronic kidney disease creatinine is 1.87.  Head CT without contrast showed no acute findings.  Nephrology consulted and recommends admission for treatment of hypercalcemia and hyponatremia.      Assessment and Plan:   # Acute metabolic encephalopathy: Most likely multifactorial.  Hypercalcemia and hypernatremia. UA negative  Continue supportive care, delirium precautions Fall precautions Monitor neuro function   # Hypercalcemia: Resolved  Due to known history of hyperparathyroidism Continue to monitor on telemetry Continue Sensipar, patient was on it due to known history of hyperparathyroidism Patient received IV fluid  Nephrology consulted, s/p pamidronate  Monitor calcium  level   # Hypernatremia: Resolved Continue to monitor   # Hypomagnesemia, mag repleted. # Hypokalemia, potassium repleted.   Resolved # Hypophosphatemia secondary to nutritional deficiency. Phos repleted. Check electrolytes daily   # Type 2 diabetes:  # Hypoglycemia due to decreased oral intake. Follow hypoglycemia protocol On SSI    # Essential hypertension: Off medications at this time Continue to monitor BP and titrate medications accordingly    # Acute on chronic kidney disease stage II:  sCr 0.86 and eGFR >60 on 09/27/2024 Continue to monitor Bladder scan negative for urinary retention US  Renal: Multiple bilateral renal lesions, as described above. No hydronephrosis. Metabolic acidosis: 10/22 patient refused IV bicarb push as per RN. 10/23, started oral bicarbonate 10/25 pCO2 14, started bicarbonate IV infusion. 10/26 pCO2 21, improved, discontinued bicarb infusion. Monitor BMP daily   # Hyperlipidemia: Will resume statin when stable.    # Dilated cardiomyopathy: Appears compensated.  Hold off medications for now, resume when patient is stable.   # Anorexia, poor appetite and frequent episodes of hypoglycemia Follow hypoglycemia protocol Encourage oral diet, continue ensures Continue Megace to stimulate appetite Continue assistance with feeding 10/20 d/w patient and her son for PEG tube insertion.  Patient has significant dementia, could not make a decision so patient's son agreed with the PEG tube insertion. 10/22 IR was consulted and patient was taken for the procedure but TOC informed that her insurance will not cover nutrition and supplies so procedure was consulted. TOC informed patient's son that insurance will not cover the nutrition and supplies so he will need out-of-pocket cost of around $400-$500 per month and he did not reply how patient will afford the cost. Continue IV fluid for hydration and to prevent hypoglycemia 10/23 d/w patient's son, he agreed for PEG tube insertion and paying out-of-pocket for tube feeds.  IR consulted, patient will be getting PEG  tube insertion tomorrow  a.m. 10/24 was on schedule for PEG tube insertion by IR, but patient refused. 10/27 d/w patient and patient's son again regarding PEG tube but patient declined.  Patient remains very high risk for readmission due to anorexia.  Plan is to discharge her home when stable.   # Pyuria, urine culture growing multiple species. S/p empiric Augmentin BID x 3 days.   # Right ankle fracture in Sep 2025 08/08/24 Rt Ankle Xray:  Oblique fracture of the distal fibula with 2 mm distraction of fracture fragments. No ankle subluxation. Patient was admitted at [facility] on 08/18/2024 and she was sent to rehab.  Patient was seen by podiatry 10/25 podiatry consulted again regarding weightbearing by PT  As per podiatry, patient should be NWB for an additional 2 weeks - if she needs to use her RLE for transfer activities / safety, recommend she do that in an ankle brace because it will be lighter for her. Otherwise she should have her prevalon boot while in bed.     # Dementia: Currently obtunded. # obstructive sleep apnea: Consider CPAP at night.    Body mass index is 29.92 kg/m.    Diet: Dysphagia 3 diet DVT Prophylaxis: Lovenox  (refusing DVT prophylaxis)   Advance goals of care discussion: Full code   Family Communication: family was present at bedside, at the time of interview.    Disposition:  Pt is from SNF, admitted with AME due to hypercalcemia, and hypernatremia, which resolved but patient has anorexia, developing multiple episodes of hypoglycemia and other electrolyte imbalance which needs to be repleted.  Medically patient is not stable at this time, we will replete electrolytes and DC planning in few days  10/27 f/u with [facility] for home health set up, DC plan tomorrow a.m.    Subjective:  No significant events overnight.  Patient declined PEG tube insertion, stated that she will be eating more when she gets home. Discussed with patient's son over the phone as well, plan is for discharge tomorrow a.m.  if remains stable.     Physical Exam: General: NAD, lying comfortably Appear in no distress, affect appropriate Eyes: PERRLA ENT: Oral Mucosa Clear, moist  Neck: no JVD,  Cardiovascular: S1 and S2 Present, no Murmur,  Respiratory: good respiratory effort, Bilateral Air entry equal and Decreased, no Crackles, no wheezes Abdomen: Bowel Sound present, Soft and no tenderness,  Skin: no rashes Extremities: no Pedal edema, no calf tenderness Neurologic: without any new focal findings Gait not checked due to patient safety concerns   Data Reviewed:   Vitals:   10/02/24 1943 10/03/24 0436 10/03/24 0500 10/03/24 0741  BP: (!) 125/55 113/73  (!) 142/67  Pulse: 69 69  67  Resp:    16  Temp: 98.7 F (37.1 C) 97.8 F (36.6 C)  98.8 F (37.1 C)  TempSrc:    Oral  SpO2: 100% 100%  100%  Weight:   69.5 kg   Height:          Latest Ref Rng & Units 10/03/2024    5:39 AM 10/02/2024    3:51 AM 10/01/2024    7:39 AM  BMP  Glucose 70 - 99 mg/dL 88  88  68   BUN 8 - 23 mg/dL 6  6  6    Creatinine 0.44 - 1.00 mg/dL 9.05  9.18  9.19   Sodium 135 - 145 mmol/L 137  141  142   Potassium 3.5 - 5.1 mmol/L 4.1  3.7  4.4  Chloride 98 - 111 mmol/L 109  109  113   CO2 22 - 32 mmol/L 23  21  14    Calcium  8.9 - 10.3 mg/dL 8.8  8.4  8.6        Latest Ref Rng & Units 10/03/2024    5:39 AM 10/02/2024    3:51 AM 09/30/2024    5:19 AM  CBC  WBC 4.0 - 10.5 K/uL 4.7  5.0  5.5   Hemoglobin 12.0 - 15.0 g/dL 89.2  89.6  88.9   Hematocrit 36.0 - 46.0 % 33.0  31.1  34.1   Platelets 150 - 400 K/uL 169  165  185     Total time spent: 55 minutes  Author: Elvan Sor, MD 10/03/2024 3:11 PM  For on call review [email or web address].

## 2024-10-03 NOTE — Plan of Care (Signed)
  Problem: Clinical Measurements: Goal: Respiratory complications will improve Outcome: Progressing   Problem: Elimination: Goal: Will not experience complications related to bowel motility Outcome: Progressing Goal: Will not experience complications related to urinary retention Outcome: Progressing   Problem: Safety: Goal: Ability to remain free from injury will improve Outcome: Progressing   

## 2024-10-04 ENCOUNTER — Other Ambulatory Visit: Payer: Self-pay

## 2024-10-04 ENCOUNTER — Inpatient Hospital Stay: Admitting: Radiology

## 2024-10-04 LAB — CBC
HCT: 32 % — ABNORMAL LOW (ref 36.0–46.0)
Hemoglobin: 10.7 g/dL — ABNORMAL LOW (ref 12.0–15.0)
MCH: 31.3 pg (ref 26.0–34.0)
MCHC: 33.4 g/dL (ref 30.0–36.0)
MCV: 93.6 fL (ref 80.0–100.0)
Platelets: 159 K/uL (ref 150–400)
RBC: 3.42 MIL/uL — ABNORMAL LOW (ref 3.87–5.11)
RDW: 16.7 % — ABNORMAL HIGH (ref 11.5–15.5)
WBC: 5.1 K/uL (ref 4.0–10.5)
nRBC: 0 % (ref 0.0–0.2)

## 2024-10-04 LAB — BASIC METABOLIC PANEL WITH GFR
Anion gap: 7 (ref 5–15)
BUN: 7 mg/dL — ABNORMAL LOW (ref 8–23)
CO2: 23 mmol/L (ref 22–32)
Calcium: 8.8 mg/dL — ABNORMAL LOW (ref 8.9–10.3)
Chloride: 109 mmol/L (ref 98–111)
Creatinine, Ser: 0.94 mg/dL (ref 0.44–1.00)
GFR, Estimated: >60 mL/min (ref >60)
Glucose, Bld: 72 mg/dL (ref 70–99)
Potassium: 4.2 mmol/L (ref 3.5–5.1)
Sodium: 139 mmol/L (ref 135–145)

## 2024-10-04 LAB — PHOSPHORUS: Phosphorus: 2.6 mg/dL (ref 2.5–4.6)

## 2024-10-04 LAB — MAGNESIUM: Magnesium: 1.8 mg/dL (ref 1.7–2.4)

## 2024-10-04 LAB — GLUCOSE, CAPILLARY
Glucose-Capillary: 91 mg/dL (ref 70–99)
Glucose-Capillary: 78 mg/dL (ref 70–99)

## 2024-10-04 MED ORDER — VITAMIN A 3 MG (10000 UNIT) PO CAPS
10000.0000 [IU] | ORAL_CAPSULE | Freq: Every day | ORAL | Status: DC
  Administered 2024-10-04: 10000 [IU] via ORAL
  Filled 2024-10-04: qty 1

## 2024-10-04 MED ORDER — ADULT MULTIVITAMIN W/MINERALS CH
1.0000 | ORAL_TABLET | Freq: Every day | ORAL | 2 refills | Status: AC
  Filled 2024-10-04: qty 30, 30d supply, fill #0

## 2024-10-04 MED ORDER — MEGESTROL ACETATE 400 MG/10ML PO SUSP
400.0000 mg | Freq: Every day | ORAL | 0 refills | Status: AC
  Filled 2024-10-04: qty 60, 6d supply, fill #0

## 2024-10-04 MED ORDER — MAGNESIUM OXIDE 400 MG PO TABS
400.0000 mg | ORAL_TABLET | Freq: Every day | ORAL | 0 refills | Status: AC
  Filled 2024-10-04: qty 30, 30d supply, fill #0

## 2024-10-04 MED ORDER — ZINC SULFATE 220 (50 ZN) MG PO CAPS
220.0000 mg | ORAL_CAPSULE | Freq: Every day | ORAL | 0 refills | Status: AC
  Filled 2024-10-04: qty 30, 30d supply, fill #0

## 2024-10-04 MED ORDER — ASCORBIC ACID 500 MG PO TABS
500.0000 mg | ORAL_TABLET | Freq: Every day | ORAL | 2 refills | Status: AC
  Filled 2024-10-04: qty 30, 30d supply, fill #0

## 2024-10-04 MED ORDER — FERROUS SULFATE 325 (65 FE) MG PO TABS
325.0000 mg | ORAL_TABLET | Freq: Every day | ORAL | 2 refills | Status: AC
  Filled 2024-10-04: qty 30, 30d supply, fill #0

## 2024-10-04 MED ORDER — VITAMIN A 3 MG (10000 UNIT) PO CAPS
10000.0000 [IU] | ORAL_CAPSULE | Freq: Every day | ORAL | 0 refills | Status: AC
  Filled 2024-10-04: qty 30, 30d supply, fill #0

## 2024-10-04 MED ORDER — ENSURE PLUS HIGH PROTEIN PO LIQD: 237.0000 mL | Freq: Three times a day (TID) | ORAL | Status: AC

## 2024-10-04 MED ORDER — TRAZODONE HCL 50 MG PO TABS
50.0000 mg | ORAL_TABLET | Freq: Every evening | ORAL | 0 refills | Status: AC | PRN
  Filled 2024-10-04: qty 30, 30d supply, fill #0

## 2024-10-04 NOTE — TOC Transition Note (Signed)
 Transition of Care [facility]) - Discharge Note   Patient Details  Name: Shelby [name] MRN: [id number] Date of Birth: 1941-08-31  Transition of Care [facility]) CM/SW Contact:  Marinda Cooks, RN Phone Number: 10/04/2024, 2:48 PM   Clinical Narrative:    This CM updated by covering MD pt medically cleared to dc today and has active DC order . This CM spoke with Admission liaison at [facility] arranged . DC transportation confirmed for pt with [facility]  Medical team updated . No additional DC needs requested by medical team or identified by CM at this time .     Final next level of care: Home w Home Health Services Barriers to Discharge: Family Issues, Other (must enter comment) (Pt's son unable to aford financail obligation for SNF / Rehab PT reccommended)   Patient Goals and CMS Choice            Discharge Placement                  Name of family member notified: Son [phone]  ) Patient and family notified of of transfer: 10/04/24  Discharge Plan and Services Additional resources added to the After Visit Summary for                  DME Arranged: Hospital bed, Other see comment [employer]) DME Agency: [employer]       HH Arranged: RN, PT, OT, Nurse's Aide, Social Work [facility] Agency: Other - See comment ([facility]) Date HH Agency Contacted: 09/27/24 Time HH Agency Contacted: [id number] Representative spoke with at [facility] Agency: Shaun  Social Drivers of Health (SDOH) Interventions SDOH Screenings   Food Insecurity: No Food Insecurity (08/18/2024)  Housing: Unknown (08/18/2024)  Transportation Needs: No Transportation Needs (08/18/2024)  Utilities: Not At Risk (08/18/2024)  Alcohol Screen: Low Risk  (12/20/2018)  Financial Resource Strain: Low Risk  (03/01/2024)   Received from [facility]  Physical Activity: Inactive (03/19/2021)   Received from [facility]  Social Connections: Socially Isolated (08/18/2024)  Stress: No  Stress Concern Present (03/19/2021)   Received from [facility]  Tobacco Use: Low Risk  (09/15/2024)     Readmission Risk Interventions     No data to display

## 2024-10-04 NOTE — Progress Notes (Signed)
 Nutrition Follow-up  DOCUMENTATION CODES:   Severe malnutrition in context of acute illness/injury  INTERVENTION:   -Continue dysphagia 3 diet -Continue Ensure Plus High Protein po TID, each supplement provides 350 kcal and 20 grams of protein  -Continue Magic cup TID with meals, each supplement provides 290 kcal and 9 grams of protein  -Continue MVI with minerals daily -Continue 500 mg vitamin C  BID -Continue 220 mg zinc sulfate daily x 14 days -10,000 units vitamin A daily x 30 days  NUTRITION DIAGNOSIS:   Severe Malnutrition related to acute illness as evidenced by severe fat depletion, severe muscle depletion, percent weight loss.  Ongoing  GOAL:   Patient will meet greater than or equal to 90% of their needs  Unmet  MONITOR:   PO intake, Supplement acceptance, Labs, Weight trends, TF tolerance, I & O's, Skin  REASON FOR ASSESSMENT:   Consult Enteral/tube feeding initiation and management  ASSESSMENT:   83 y/o female with h/o dilated cardiomyopathy, HLD, OSA, CHF, DM, peripheral neuropathy, HTN, CKD III, dementia, kidney stones, IDA, poiter, pacreatic cyst, ankle fracture and recent UTI and pneumonia and who is now admitted with AMS, hypercalemia and FTT.  10/22- PEG tube placement cancelled 10/23- advanced to dysphagia 3 diet 10/25- patient refused PEG placement in IR suite  Reviewed I/O's: -40 ml x 24 hours and +480 ml x 24 hours  UOP: 550 ml x 24 hours   Per MD and IR notes, no plans for PEG.   Patient with very minimal intake. Noted meal completions 0%, however, is drinking Ensure supplements.   Weight has been stable over the past week.   Medications reviewed and include magnesium  oxide and megace.   Labs reviewed: CBGS: 66-95 (inpatient orders for glycemic control are 0-15 units insulin  aspart TID with meals and 0-5 units insulin  aspart daily at bedtime).  Vitamin A: 18.7  Diet Order:   Diet Order             DIET DYS 3 Room service  appropriate? Yes; Fluid consistency: Thin  Diet effective now                   EDUCATION NEEDS:   Education needs have been addressed  Skin:  Skin Assessment: Skin Integrity Issues: Skin Integrity Issues:: Unstageable, Other (Comment) Unstageable: sacrum Other: full thickness wounds on buttocks related to MASD  Last BM:  10/04/24 (type 6)  Height:   Ht Readings from Last 1 Encounters:  09/20/24 5' (1.524 m)    Weight:   Wt Readings from Last 1 Encounters:  10/04/24 70.5 kg    Ideal Body Weight:  45.45 kg  BMI:  Body mass index is 30.35 kg/m.  Estimated Nutritional Needs:   Kcal:  1600-1800kcal/day  Protein:  80-90g/day  Fluid:  1.4-1.6L/day    Margery ORN, RD, LDN, CDCES Registered Dietitian III Certified Diabetes Care and Education Specialist If unable to reach this RD, please use RD Inpatient group chat on secure chat between hours of 8am-4 pm daily

## 2024-10-04 NOTE — Plan of Care (Signed)
  Problem: Clinical Measurements: Goal: Will remain free from infection Outcome: Progressing Goal: Diagnostic test results will improve Outcome: Progressing Goal: Cardiovascular complication will be avoided Outcome: Progressing   Problem: Nutrition: Goal: Adequate nutrition will be maintained Outcome: Progressing   Problem: Elimination: Goal: Will not experience complications related to bowel motility Outcome: Progressing Goal: Will not experience complications related to urinary retention Outcome: Progressing   Problem: Safety: Goal: Ability to remain free from injury will improve Outcome: Progressing   Problem: Skin Integrity: Goal: Risk for impaired skin integrity will decrease Outcome: Progressing

## 2024-10-04 NOTE — Discharge Summary (Signed)
 [facility] Discharge Summary   Patient: Shelby [name] [name]  PCP: Shelby Clotilda DEL, NP  Date of admission: 09/15/2024   Date of discharge:  10/04/2024     Discharge Diagnoses:  Principal Problem:   Hypercalcemia Active Problems:   Hypertension associated with type 2 diabetes mellitus (HCC)   Acute renal failure superimposed on stage 3b chronic kidney disease (HCC)   Hyperlipidemia   Chronic diastolic CHF (congestive heart failure) (HCC)   Type II diabetes mellitus with renal manifestations (HCC)   Dilated cardiomyopathy (HCC)   Obstructive sleep apnea   Unspecified dementia, unspecified severity, without behavioral disturbance, psychotic disturbance, mood disturbance, and anxiety (HCC)   Acute metabolic encephalopathy   Hypernatremia   Protein-calorie malnutrition, severe   Admitted From: SNF Disposition:  Home with home health services  Recommendations for Outpatient Follow-up:  F/u with PCP in 1 wk Follow-up with podiatry in 1 to 2 weeks for right ankle fracture follow-up Discontinued most of her medications because she is normotensive and euvolemic.  Restart medications as needed. Continue multivitamins and protein supplements. Follow up LABS/TEST:  Repeat CBC, BMP, magnesium  and phosphorus level in 1 to 2 weeks.   Contact information for follow-up providers     Shelby Clotilda DEL, NP Follow up on 10/04/2024.   Specialty: Family Medicine Why: hospital follow up office states appt has to be made by patient Contact information: [address] [place] [address] [phone]         Shelby [name], DPM Follow up in 2 week(s).   Specialty: Podiatry Contact information: [address] [place] [address] [phone]              Contact information for after-discharge care     Home Medical Care     [facility] .   Service: Home Health Services Contact information: [address] [facility]  [place] [address] [phone]                    Diet recommendation: Regular diet, dysphagia 3  Activity: The patient is advised to gradually reintroduce usual activities, as tolerated  Discharge Condition: stable  Code Status: DNR/intervene  History of present illness: As per the H and P dictated on admission.  Hospital Course:  Shelby [name] is a 83 y.o. female with medical history significant of Hyperparathyroidism, dementia, heart failure with reduced ejection fraction, anemia of chronic disease, dilated cardiomyopathy, type 2 diabetes, essential hypertension, hyperlipidemia, obstructive sleep apnea, peripheral neuropathy, chronic kidney disease stage III, history of seizure-like activity and urinary incontinence who was brought in from [facility] by family wanting her to be checked.  Patient was recently diagnosed with UTI pneumonia and CHF exacerbation.  She has been taking medications at the facility.  Family visited and noticed she is more confused.  Patient is currently unable to give history.  She also altered.  She responds to voice by opening her eyes.  Workup performed showed significant change from last month.  Her sodium is 150, calcium  14.2 and phosphorus only 1.  Patient also has known history of chronic kidney disease creatinine is 1.87.  Head CT without contrast showed no acute findings.  Nephrology consulted and recommends admission for treatment of hypercalcemia and hyponatremia.      Assessment and Plan:   # Acute metabolic encephalopathy: Most likely multifactorial.  Hypercalcemia and hypernatremia. UA negative  Continue supportive care, delirium precautions, Fall precautions   # Hypercalcemia: Resolved  Due to  known history of hyperparathyroidism Continue Sensipar, patient was on it due to known history of hyperparathyroidism. Patient received IV fluid. Nephrology consulted, s/p pamidronate. Calcium  level wnl.    # Hypernatremia:  Resolved # Hypomagnesemia, mag repleted.  Resolved # Hypokalemia, potassium repleted.  Resolved # Hypophosphatemia secondary to nutritional deficiency. Phos repleted.  Resolved # Type 2 diabetes: s/p insulin  sliding scale, but patient was having multiple episodes of hypoglycemia due to anorexia.  Monitor CBG at home. # Hypoglycemia due to decreased oral intake. S/p hypoglycemia protocol during hospital stay.  Monitor CBG at home and encourage to increase oral intake.   # Essential hypertension: Off medications at this time.  Discontinued home meds amlodipine , hydrochlorothiazide  and torsemide .  Blood pressure remained soft, continue to monitor BP at home and follow with PCP to titrate medications accordingly.   # Acute on chronic kidney disease stage II:  sCr 0.86 and eGFR >60 on 09/27/2024 Bladder scan negative for urinary retention US  Renal: Multiple bilateral renal lesions, as described above. No hydronephrosis. Metabolic acidosis: 10/22 patient refused IV bicarb push as per RN. 10/23, started oral bicarbonate 10/25 pCO2 14, started bicarbonate IV infusion. 10/26 pCO2 21, improved, discontinued bicarb infusion. Currently bicarb range.  Follow with PCP to repeat BMP in 1 to 2 wks  # Hyperlipidemia: Held Lipitor for now.  Follow with PCP to resume when needed.    # Dilated cardiomyopathy: Appears compensated.  Hold off medications for now.  Follow-up with PCP to resume medications when patient needs it.   # Anorexia, poor appetite and frequent episodes of hypoglycemia S/p hypoglycemia protocol, Encouraged oral diet, continue ensures Continue Megace to stimulate appetite 10/20 d/w patient and her son for PEG tube insertion.  Patient has significant dementia, could not make a decision so patient's son agreed with the PEG tube insertion. 10/22 IR was consulted and patient was taken for the procedure but TOC informed that her insurance will not cover nutrition and supplies so procedure was  consulted. TOC informed patient's son that insurance will not cover the nutrition and supplies so he will need out-of-pocket cost of around $400-$500 per month and he did not reply how patient will afford the cost. S/p IV fluid for hydration and to prevent hypoglycemia 10/23 d/w patient's son, he agreed for PEG tube insertion and paying out-of-pocket for tube feeds.  IR consulted, patient will be getting PEG tube insertion tomorrow a.m. 10/24 was on schedule for PEG tube insertion by IR, but patient refused. 10/27 d/w patient and patient's son again regarding PEG tube but patient declined.  Patient remains very high risk for readmission due to anorexia.  Plan is to discharge her home today as patient's nutrition is improving and no hypoglycemia today.  Electrolytes are within normal range as well.   # Pyuria, urine culture growing multiple species. S/p empiric Augmentin BID x 3 days.   # Right ankle fracture in Sep 2025 08/08/24 Rt Ankle Xray:  Oblique fracture of the distal fibula with 2 mm distraction of fracture fragments. No ankle subluxation. Patient was admitted at [facility] on 08/18/2024 and she was sent to rehab.  Patient was seen by podiatry 10/25 podiatry consulted again regarding weightbearing by PT  As per podiatry, patient should be NWB for an additional 2 weeks - if she needs to use her RLE for transfer activities / safety, recommend she do that in an ankle brace because it will be lighter for her. Otherwise she should have her prevalon boot while in bed.   #  Dementia: Currently obtunded. # obstructive sleep apnea: Consider CPAP at night.    Body mass index is 30.35 kg/m.  Nutrition Problem: Severe Malnutrition Etiology: acute illness Nutrition Interventions:  Wound 08/18/24 1821 Pressure Injury Sacrum Mid Stage 3 -  Full thickness tissue loss. Subcutaneous fat may be visible but bone, tendon or muscle are NOT exposed. (Active)     Wound 09/22/24 1622 Pressure Injury Buttocks Right  Stage 2 -  Partial thickness loss of dermis presenting as a shallow open injury with a red, pink wound bed without slough. (Active)     Patient was seen by physical therapy, who recommended Therapy, SNF placement but as per [facility] patient does not have much left over days and patient's son cannot afford LTC placement so plan is to discharge her home with home health services.    On the day of the discharge the patient's vitals were stable, and no other acute medical condition were reported by patient. the patient was felt safe to be discharge at Home with Home health.  Consultants: Nephrology, podiatry and IR Procedures: None  Discharge Exam: General: Appear in no distress, Oral Mucosa Clear, moist. Cardiovascular: S1 and S2 Present, no Murmur, Respiratory: normal respiratory effort, Bilateral Air entry present and no Crackles, no wheezes Abdomen: Bowel Sound present, Soft and no tenderness. Extremities: no Pedal edema, no calf tenderness Neurology: AAOx 1, no focal deficits. affect appropriate.  Filed Weights   10/02/24 0500 10/03/24 0500 10/04/24 0500  Weight: 74.2 kg 69.5 kg 70.5 kg   Vitals:   10/04/24 0425 10/04/24 0749  BP: (!) 133/59 (!) 116/58  Pulse: 71 67  Resp:  17  Temp: 98.3 F (36.8 C) 98.2 F (36.8 C)  SpO2: 100% 100%    DISCHARGE MEDICATION: Allergies as of 10/04/2024       Reactions   Furosemide  Other (See Comments)   Legs swelling        Medication List     STOP taking these medications    amLODipine  10 MG tablet Commonly known as: NORVASC    aspirin  EC 81 MG tablet   atorvastatin  80 MG tablet Commonly known as: LIPITOR   carvedilol  25 MG tablet Commonly known as: COREG    Farxiga  10 MG Tabs tablet Generic drug: dapagliflozin  propanediol   gabapentin  300 MG capsule Commonly known as: NEURONTIN    hydrochlorothiazide  12.5 MG capsule Commonly known as: MICROZIDE    torsemide  20 MG tablet Commonly known as: DEMADEX        TAKE these  medications    acetaminophen  325 MG tablet Commonly known as: TYLENOL  Take 2 tablets (650 mg total) by mouth every 6 (six) hours as needed for mild pain (pain score 1-3) (or Fever >/= 100.4).   ascorbic acid  500 MG tablet Commonly known as: VITAMIN C  Take 1 tablet (500 mg total) by mouth daily.   cinacalcet 30 MG tablet Commonly known as: SENSIPAR Take 30 mg by mouth daily with breakfast.   donepezil  10 MG tablet Commonly known as: ARICEPT  Take 10 mg by mouth at bedtime.   feeding supplement Liqd Take 237 mLs by mouth 3 (three) times daily between meals.   ferrous sulfate  325 (65 FE) MG tablet Take 1 tablet (325 mg total) by mouth daily with breakfast. Takes one pill every 2-3 days to minimize constipation What changed:  how much to take when to take this   magnesium  oxide 400 MG tablet Commonly known as: MAG-OX Take 1 tablet (400 mg total) by mouth daily.   megestrol 400  MG/10ML suspension Commonly known as: MEGACE Take 10 mLs (400 mg total) by mouth daily for 7 days. Start taking on: October 05, 2024   multivitamin with minerals Tabs tablet Take 1 tablet by mouth daily. Start taking on: October 05, 2024   nystatin  powder Commonly known as: MYCOSTATIN /NYSTOP  Apply topically as needed (Apply to Groin; Skin folds as needed).   travoprost  (benzalkonium) 0.004 % ophthalmic solution Commonly known as: TRAVATAN  Place 1 drop into both eyes at bedtime.   traZODone  50 MG tablet Commonly known as: DESYREL  Take 1 tablet (50 mg total) by mouth at bedtime as needed for sleep. What changed:  when to take this reasons to take this   vitamin A 3 MG (10000 UNITS) capsule Take 1 capsule (10,000 Units total) by mouth daily.   zinc sulfate (50mg  elemental zinc) 220 (50 Zn) MG capsule Take 1 capsule (220 mg total) by mouth daily. Start taking on: October 05, 2024               Durable Medical Equipment  (From admission, onward)           Start     Ordered    09/22/24 1552  For home use only DME Hospital bed  Once       Question Answer Comment  Length of Need Lifetime   Bed type Semi-electric      09/22/24 1551   09/22/24 1552  For home use only DME Other see comment  Once       Comments: Deitra lift  Question:  Length of Need  Answer:  Lifetime   09/22/24 1552              Discharge Care Instructions  (From admission, onward)           Start     Ordered   10/04/24 0000  Discharge wound care:       Comments: As above   10/04/24 1044           Allergies  Allergen Reactions   Furosemide  Other (See Comments)    Legs swelling    Discharge Instructions     Call MD for:   Complete by: As directed    Hypoglycemia   Call MD for:  difficulty breathing, headache or visual disturbances   Complete by: As directed    Call MD for:  extreme fatigue   Complete by: As directed    Call MD for:  persistant dizziness or light-headedness   Complete by: As directed    Call MD for:  persistant nausea and vomiting   Complete by: As directed    Call MD for:  severe uncontrolled pain   Complete by: As directed    Call MD for:  temperature >100.4   Complete by: As directed    Diet general   Complete by: As directed    Discharge instructions   Complete by: As directed    F/u with PCP in 1 wk Follow-up with podiatry in 1 to 2 weeks for right ankle fracture follow-up Discontinued most of her medications because she is normotensive and euvolemic.  Restart medications as needed. Continue multivitamins and protein supplements. Repeat CBC, BMP, magnesium  and phosphorus level in 1 to 2 weeks.   Discharge wound care:   Complete by: As directed    As above   Increase activity slowly   Complete by: As directed        The results of significant diagnostics from this hospitalization (including  imaging, microbiology, ancillary and laboratory) are listed below for reference.    Significant Diagnostic Studies: DG Ankle Complete  Right Result Date: 09/30/2024 CLINICAL DATA:  Right ankle fracture. EXAM: RIGHT ANKLE - COMPLETE 3+ VIEW COMPARISON:  08/08/2024 FINDINGS: Mildly displaced distal fibular fracture, slight increased displacement from prior exam. Incomplete peripheral callus formation. No mortise widening. No new fracture. Generalized soft tissue edema persists. IMPRESSION: Mildly displaced distal fibular fracture, slight increased displacement from prior exam. Incomplete peripheral callus formation. Electronically Signed   By: Andrea Gasman M.D.   On: 09/30/2024 18:29   IR GASTROSTOMY TUBE MOD SED Result Date: 09/29/2024 INDICATION: Anorexia and poor nutrition in a patient with diabetes and significant dementia. The patient has had poor appetite with recurrent hypoglycemia due to poor oral intake and poor nutrition. Interventional Radiology requested placement of percutaneous gastrostomy tube for nutritional support and therapy. EXAM: Gastrostomy tube placement ANESTHESIA/SEDATION: Moderate (conscious) sedation was employed during this procedure. A total of Versed 1 mg and Fentanyl  0 mcg was administered intravenously by the radiology nurse. Total intra-service moderate Sedation Time: 0 minutes. The patient's level of consciousness and vital signs were monitored continuously by radiology nursing throughout the procedure under my direct supervision. FLUOROSCOPY: Radiation Exposure Index (as provided by the fluoroscopic device): 0 mGy Kerma COMPLICATIONS: None immediate. PROCEDURE: Informed written consent was obtained from the patient after a thorough discussion of the procedural risks, benefits and alternatives. All questions were addressed. Maximal Sterile Barrier Technique was utilized including caps, mask, sterile gowns, sterile gloves, sterile drape, hand hygiene and skin antiseptic. A timeout was performed prior to the initiation of the procedure. Under fluoroscopy, a nasogastric tube was inserted by advancing a Kumpe  catheter through the nostril and into the stomach via the esophagus. The catheter was placed in the stomach over a guidewire. After initial placement of the nasogastric tube, we received information that the patient has tube feeds would not be covered by insurance and were told to hold placement of the percutaneous gastrostomy tube. The nasogastric tube was removed. IMPRESSION: The percutaneous gastrostomy tube was not placed secondary to lack of insurance coverage for tube feeds in this patient. In the future, if plans change I would recommend have the patient get a nasogastric Dobbhoff placed on the floor and inject oral contrast prior to midnight day before placement of the gastrostomy tube. The patient would also likely benefit from dosing Ativan  on the floor prior to returning to the IR suite for placement of a percutaneous gastrostomy tube. Electronically Signed   By: Cordella Banner   On: 09/29/2024 11:27   CT ABDOMEN WO CONTRAST Result Date: 09/26/2024 EXAM: CT ABDOMEN WITHOUT CONTRAST 09/26/2024 04:46:00 PM TECHNIQUE: CT of the abdomen was performed without the administration of intravenous contrast. Multiplanar reformatted images are provided for review. Automated exposure control, iterative reconstruction, and/or weight based adjustment of the mA/kV was utilized to reduce the radiation dose to as low as reasonably achievable. COMPARISON: Comparison with 05/25/2018 and MRI abdomen 09/17/2024. CLINICAL HISTORY: Gastrostomy tube evaluation. Gastrostomy tube evaluation; For IR procedure. FINDINGS: LOWER CHEST: Visualized portion of the lower chest demonstrates no acute abnormality. HEPATOBILIARY: Cholelithiasis. Distended gallbladder. No evidence of acute cholecystitis. SPLEEN: Spleen demonstrates no acute abnormality. PANCREAS: Pancreas demonstrates no acute abnormality. ADRENAL GLANDS: Adrenal glands demonstrate no acute abnormality. KIDNEYS: Multiple hyperdense renal cysts better evaluated on MRI  09/17/2024. See that report for recommendations. No stones in the kidneys or proximal ureters. No hydronephrosis. No perinephric or periureteral stranding. GI AND BOWEL:  Stomach and duodenal sweep demonstrate no acute abnormality. Duodenal diverticulum. There is no bowel obstruction. No abnormal bowel wall thickening or distension. PERITONEUM AND RETROPERITONEUM: No ascites or free air. Aorta is normal in caliber. Aortic atherosclerotic calcification. LYMPH NODES: No lymphadenopathy. BONES AND SOFT TISSUES: No acute abnormality of the visualized bones. No focal soft tissue abnormality. IMPRESSION: 1. No acute abnormality in the abdomen. 2. Multiple hyperdense renal cysts; further evaluation and recommendations per MRI 09/17/24. Electronically signed by: Norman Gatlin MD 09/26/2024 09:38 PM EDT RP Workstation: [id number]   US  RENAL Result Date: 09/17/2024 CLINICAL DATA:  [id number] AKI (acute kidney injury) [id number] EXAM: RENAL / URINARY TRACT ULTRASOUND COMPLETE COMPARISON:  None Available. FINDINGS: Right Kidney: Renal measurements: 3.4 x 5.5 x 9.0 cm = volume: 88.4 mL. Echogenicity within normal limits. No hydronephrosis. There are multiple anechoic structures in the right kidney with largest measuring up to 1.8 x 1.9 x 2.6 cm. Majority of these are favored to represent simple cysts. However, not all the renal lesions are seen on this exam. Please refer to MRI abdomen report from the same day for details. Left Kidney: Renal measurements: 4.6 x 4.7 x 8.5 cm = volume: 95.7 mL. Echogenicity within normal limits. No hydronephrosis. There are multiple anechoic structures in the left kidney with largest measuring up to 3.4 x 3.5 x 3.5 cm. Majority of these are favored to represent simple cysts. However, not all the renal lesions are seen on this exam. Please refer to MRI abdomen report from the same day for details. Bladder: Appears normal for degree of bladder distention. Other: None. IMPRESSION: *Multiple bilateral  renal lesions, as described above. No hydronephrosis. Electronically Signed   By: Ree Molt M.D.   On: 09/17/2024 17:38   MR ABDOMEN W WO CONTRAST Result Date: 09/17/2024 CLINICAL DATA:  Right renal mass, patient unable to hold breath on inspiration EXAM: MRI ABDOMEN WITHOUT AND WITH CONTRAST TECHNIQUE: Multiplanar multisequence MR imaging of the abdomen was performed both before and after the administration of intravenous contrast. CONTRAST:  8mL GADAVIST  GADOBUTROL  1 MMOL/ML IV SOLN COMPARISON:  MRI August 21, 2022. FINDINGS: Despite efforts by the technologist and patient, motion artifact is present on today's exam and could not be eliminated. This reduces exam sensitivity and specificity. Lower chest: Heterogeneous signal in the bilateral lung bases likely reflects atelectasis. Hepatobiliary: No significant hepatic steatosis or iron deposition. Nonenhancing lesion in the hepatic dome not well evaluated on today's motion degraded examination. Sludge filled gallbladder is distended without wall thickening or pericholecystic fluid. No biliary ductal dilation. Pancreas: No pancreatic ductal dilation or evidence of acute inflammation. Stable cystic lesion in the pancreatic head measuring 7 mm on image 26/24. Spleen:  No splenomegaly. Adrenals/Urinary Tract: The previously indexed 9 mm enhancing lesion off the posterior interpolar right kidney which measured 9 mm on image 57/15 on MRI August 21, 2022, 9 mm on MRI April 01, 2021 on image 59/19 and 8 mm when remeasured for consistency on MRI April 03, 2020 on image 52/14 of that examination. On today's examination the lesion is likely identified on image 57/19, 59/17 and image 26/24 measuring 7 mm. Numerous bilateral renal lesions of varying degrees of complexity some of which are intrinsically T1 hyperintense and some which are intrinsically T2 hypointense, but are poorly evaluated on this examination which is significantly degraded by motion.  Stomach/Bowel: Stomach is nondistended. No evidence of bowel obstruction. Vascular/Lymphatic: Normal caliber abdominal aorta. No pathologically enlarged abdominal or pelvic lymph nodes. Other:  No significant  abdominal free fluid. Musculoskeletal: No suspicious osseous lesion. IMPRESSION: Severely motion degraded examination limits sensitivity and specificity. Within this context: 1. The previously indexed 9 mm enhancing lesion off the posterior interpolar right kidney is not well evaluated on today's examination but appears grossly stable in size. Suggest continued surveillance imaging in short interval, in 6 months, upon resolution of patient's current symptomatology when they are better able to follow commands and maintain breath hold. 2. Numerous bilateral renal lesions of varying degrees of complexity some of which are intrinsically T1 hyperintense are poorly evaluated on this examination which is significantly degraded by motion. Suggest attention on follow-up imaging. 3. 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 Recommend attention on follow-up MRI. 4. Sludge filled gallbladder is distended without wall thickening or pericholecystic fluid, suggest correlation for right upper quadrant tenderness to palpation. Electronically Signed   By: Reyes Holder M.D.   On: 09/17/2024 08:26   CT Head Wo Contrast Result Date: 09/15/2024 CLINICAL DATA:  Altered mental status. EXAM: CT HEAD WITHOUT CONTRAST TECHNIQUE: Contiguous axial images were obtained from the base of the skull through the vertex without intravenous contrast. RADIATION DOSE REDUCTION: This exam was performed according to the departmental dose-optimization program which includes automated exposure control, adjustment of the mA and/or kV according to patient size and/or use of iterative reconstruction technique. COMPARISON:  07/21/2024 FINDINGS: Brain: No intracranial hemorrhage, mass effect, or midline  shift. Stable atrophy. No hydrocephalus. The basilar cisterns are patent. Stable chronic small vessel ischemia. No evidence of territorial infarct or acute ischemia. No extra-axial or intracranial fluid collection. Vascular: Atherosclerosis of skullbase vasculature without hyperdense vessel or abnormal calcification. Skull: No fracture or focal lesion. Sinuses/Orbits: No acute finding. Other: None. IMPRESSION: 1. No acute intracranial abnormality. 2. Stable atrophy and chronic small vessel ischemia. Electronically Signed   By: Andrea Gasman M.D.   On: 09/15/2024 18:10    Microbiology: No results found for this or any previous visit (from the past 240 hours).   Labs: CBC: Recent Labs  Lab 09/29/24 0529 09/30/24 0519 10/02/24 0351 10/03/24 0539 10/04/24 0342  WBC 5.7 5.5 5.0 4.7 5.1  HGB 11.7* 11.0* 10.3* 10.7* 10.7*  HCT 36.4 34.1* 31.1* 33.0* 32.0*  MCV 96.0 95.0 93.1 94.8 93.6  PLT 189 185 165 169 159   Basic Metabolic Panel: Recent Labs  Lab 09/30/24 0519 10/01/24 0739 10/02/24 0351 10/03/24 0539 10/04/24 0342  NA 141 142 141 137 139  K 4.0 4.4 3.7 4.1 4.2  CL 114* 113* 109 109 109  CO2 21* 14* 21* 23 23  GLUCOSE 90 68* 88 88 72  BUN 7* 6* 6* 6* 7*  CREATININE 0.86 0.80 0.81 0.94 0.94  CALCIUM  8.3* 8.6* 8.4* 8.8* 8.8*  MG 1.8 1.5* 1.9 1.9 1.8  PHOS 1.6* 2.6 1.4* 1.8* 2.6   Liver Function Tests: No results for input(s): AST, ALT, ALKPHOS, BILITOT, PROT, ALBUMIN  in the last 168 hours. No results for input(s): LIPASE, AMYLASE in the last 168 hours. No results for input(s): AMMONIA in the last 168 hours. Cardiac Enzymes: No results for input(s): CKTOTAL, CKMB, CKMBINDEX, TROPONINI in the last 168 hours. BNP (last 3 results) No results for input(s): BNP in the last 8760 hours. CBG: Recent Labs  Lab 10/03/24 0838 10/03/24 1133 10/03/24 1621 10/03/24 1920 10/04/24 0750  GLUCAP 68* 81 84 95 91    Time spent: 35  minutes  Signed:  Elvan Sor  [facility] 10/04/2024 11:37  AM

## 2025-01-08 DEATH — deceased
